# Patient Record
Sex: Female | Born: 1965 | Race: White | Hispanic: No | Smoking: Former smoker
Health system: Southern US, Community
[De-identification: ages and names within clinical notes are randomized; demographics above are authoritative.]

## PROBLEM LIST (undated history)

## (undated) DIAGNOSIS — E785 Hyperlipidemia, unspecified: Secondary | ICD-10-CM

## (undated) DIAGNOSIS — M810 Age-related osteoporosis without current pathological fracture: Secondary | ICD-10-CM

## (undated) DIAGNOSIS — I10 Essential (primary) hypertension: Secondary | ICD-10-CM

## (undated) DIAGNOSIS — F419 Anxiety disorder, unspecified: Secondary | ICD-10-CM

## (undated) DIAGNOSIS — D649 Anemia, unspecified: Secondary | ICD-10-CM

## (undated) HISTORY — DX: Anxiety disorder, unspecified: F41.9

## (undated) HISTORY — DX: Age-related osteoporosis without current pathological fracture: M81.0

## (undated) HISTORY — DX: Essential (primary) hypertension: I10

## (undated) HISTORY — DX: Hyperlipidemia, unspecified: E78.5

## (undated) HISTORY — PX: ABDOMINAL HYSTERECTOMY: SHX81

## (undated) HISTORY — DX: Anemia, unspecified: D64.9

---

## 2001-08-04 ENCOUNTER — Emergency Department (HOSPITAL_COMMUNITY): Admission: EM | Admit: 2001-08-04 | Discharge: 2001-08-04 | Payer: Self-pay

## 2002-11-18 ENCOUNTER — Emergency Department (HOSPITAL_COMMUNITY): Admission: EM | Admit: 2002-11-18 | Discharge: 2002-11-18 | Payer: Self-pay | Admitting: Emergency Medicine

## 2003-04-18 ENCOUNTER — Other Ambulatory Visit: Admission: RE | Admit: 2003-04-18 | Discharge: 2003-04-18 | Payer: Self-pay | Admitting: *Deleted

## 2003-04-18 ENCOUNTER — Other Ambulatory Visit: Admission: RE | Admit: 2003-04-18 | Discharge: 2003-04-18 | Payer: Self-pay | Admitting: Family Medicine

## 2005-01-11 ENCOUNTER — Emergency Department (HOSPITAL_COMMUNITY): Admission: EM | Admit: 2005-01-11 | Discharge: 2005-01-11 | Payer: Self-pay | Admitting: Emergency Medicine

## 2009-05-08 ENCOUNTER — Emergency Department (HOSPITAL_COMMUNITY): Admission: EM | Admit: 2009-05-08 | Discharge: 2009-05-08 | Payer: Self-pay | Admitting: Emergency Medicine

## 2010-09-25 LAB — POCT CARDIAC MARKERS
CKMB, poc: <1 ng/mL — ABNORMAL LOW (ref 1.0–8.0)
Myoglobin, poc: 40.3 ng/mL (ref 12–200)

## 2010-09-25 LAB — CBC
MCHC: 34.5 g/dL (ref 30.0–36.0)
MCV: 94.9 fL (ref 78.0–100.0)
Platelets: 281 10*3/uL (ref 150–400)
RBC: 4.35 MIL/uL (ref 3.87–5.11)
RDW: 12.7 % (ref 11.5–15.5)

## 2010-09-25 LAB — BASIC METABOLIC PANEL
BUN: 5 mg/dL — ABNORMAL LOW (ref 6–23)
CO2: 26 mEq/L (ref 19–32)
Calcium: 9.3 mg/dL (ref 8.4–10.5)
Chloride: 102 mEq/L (ref 96–112)
Creatinine, Ser: 0.61 mg/dL (ref 0.4–1.2)
GFR calc Af Amer: >60 mL/min (ref >60)

## 2010-09-25 LAB — DIFFERENTIAL
Basophils Absolute: 0 10*3/uL (ref 0.0–0.1)
Basophils Relative: 0 % (ref 0–1)
Eosinophils Absolute: 0.1 10*3/uL (ref 0.0–0.7)
Monocytes Relative: 9 % (ref 3–12)
Neutro Abs: 5.5 10*3/uL (ref 1.7–7.7)
Neutrophils Relative %: 66 % (ref 43–77)

## 2014-01-13 ENCOUNTER — Other Ambulatory Visit: Payer: Self-pay | Admitting: Family

## 2014-02-17 ENCOUNTER — Ambulatory Visit (INDEPENDENT_AMBULATORY_CARE_PROVIDER_SITE_OTHER): Payer: Medicaid Other | Admitting: Family

## 2014-02-17 ENCOUNTER — Encounter: Payer: Self-pay | Admitting: Family

## 2014-02-17 ENCOUNTER — Encounter (INDEPENDENT_AMBULATORY_CARE_PROVIDER_SITE_OTHER): Payer: Self-pay

## 2014-02-17 ENCOUNTER — Other Ambulatory Visit: Payer: Self-pay | Admitting: *Deleted

## 2014-02-17 VITALS — BP 138/85 | HR 83 | Temp 98.1°F | Ht 66.25 in | Wt 135.4 lb

## 2014-02-17 DIAGNOSIS — R5381 Other malaise: Secondary | ICD-10-CM

## 2014-02-17 DIAGNOSIS — Z23 Encounter for immunization: Secondary | ICD-10-CM

## 2014-02-17 DIAGNOSIS — Z0000 Encounter for general adult medical examination without abnormal findings: Secondary | ICD-10-CM

## 2014-02-17 DIAGNOSIS — Z01419 Encounter for gynecological examination (general) (routine) without abnormal findings: Secondary | ICD-10-CM

## 2014-02-17 DIAGNOSIS — F411 Generalized anxiety disorder: Secondary | ICD-10-CM

## 2014-02-17 DIAGNOSIS — R5383 Other fatigue: Secondary | ICD-10-CM

## 2014-02-17 DIAGNOSIS — R252 Cramp and spasm: Secondary | ICD-10-CM

## 2014-02-17 DIAGNOSIS — Z124 Encounter for screening for malignant neoplasm of cervix: Secondary | ICD-10-CM

## 2014-02-17 LAB — POCT UA - MICROSCOPIC ONLY
BACTERIA, U MICROSCOPIC: NEGATIVE
CASTS, UR, LPF, POC: NEGATIVE
Crystals, Ur, HPF, POC: NEGATIVE
MUCUS UA: NEGATIVE
RBC, URINE, MICROSCOPIC: NEGATIVE
WBC, Ur, HPF, POC: NEGATIVE
Yeast, UA: NEGATIVE

## 2014-02-17 LAB — POCT URINALYSIS DIPSTICK
Bilirubin, UA: NEGATIVE
GLUCOSE UA: NEGATIVE
KETONES UA: NEGATIVE
Leukocytes, UA: NEGATIVE
Nitrite, UA: NEGATIVE
Protein, UA: NEGATIVE
RBC UA: NEGATIVE
UROBILINOGEN UA: NEGATIVE
pH, UA: 6.5

## 2014-02-17 MED ORDER — ESCITALOPRAM OXALATE 10 MG PO TABS: 10.0000 mg | ORAL_TABLET | Freq: Every day | ORAL | Status: DC

## 2014-02-17 NOTE — Patient Instructions (Addendum)
Health Maintenance Adopting a healthy lifestyle and getting preventive care can go a long way to promote health and wellness. Talk with your health care provider about what schedule of regular examinations is right for you. This is a good chance for you to check in with your provider about disease prevention and staying healthy. In between checkups, there are plenty of things you can do on your own. Experts have done a lot of research about which lifestyle changes and preventive measures are most likely to keep you healthy. Ask your health care provider for more information. WEIGHT AND DIET  Eat a healthy diet  Be sure to include plenty of vegetables, fruits, low-fat dairy products, and lean protein.  Do not eat a lot of foods high in solid fats, added sugars, or salt.  Get regular exercise. This is one of the most important things you can do for your health.  Most adults should exercise for at least 150 minutes each week. The exercise should increase your heart rate and make you sweat (moderate-intensity exercise).  Most adults should also do strengthening exercises at least twice a week. This is in addition to the moderate-intensity exercise.  Maintain a healthy weight  Body mass index (BMI) is a measurement that can be used to identify possible weight problems. It estimates body fat based on height and weight. Your health care provider can help determine your BMI and help you achieve or maintain a healthy weight.  For females 25 years of age and older:   A BMI below 18.5 is considered underweight.  A BMI of 18.5 to 24.9 is normal.  A BMI of 25 to 29.9 is considered overweight.  A BMI of 30 and above is considered obese.  Watch levels of cholesterol and blood lipids  You should start having your blood tested for lipids and cholesterol at 49 years of age, then have this test every 5 years.  You may need to have your cholesterol levels checked more often if:  Your lipid or  cholesterol levels are high.  You are older than 49 years of age.  You are at high risk for heart disease.  CANCER SCREENING   Lung Cancer  Lung cancer screening is recommended for adults 97-92 years old who are at high risk for lung cancer because of a history of smoking.  A yearly low-dose CT scan of the lungs is recommended for people who:  Currently smoke.  Have quit within the past 15 years.  Have at least a 30-pack-year history of smoking. A pack year is smoking an average of one pack of cigarettes a day for 1 year.  Yearly screening should continue until it has been 15 years since you quit.  Yearly screening should stop if you develop a health problem that would prevent you from having lung cancer treatment.  Breast Cancer  Practice breast self-awareness. This means understanding how your breasts normally appear and feel.  It also means doing regular breast self-exams. Let your health care provider know about any changes, no matter how small.  If you are in your 20s or 30s, you should have a clinical breast exam (CBE) by a health care provider every 1-3 years as part of a regular health exam.  If you are 76 or older, have a CBE every year. Also consider having a breast X-ray (mammogram) every year.  If you have a family history of breast cancer, talk to your health care provider about genetic screening.  If you are  at high risk for breast cancer, talk to your health care provider about having an MRI and a mammogram every year.  Breast cancer gene (BRCA) assessment is recommended for women who have family members with BRCA-related cancers. BRCA-related cancers include:  Breast.  Ovarian.  Tubal.  Peritoneal cancers.  Results of the assessment will determine the need for genetic counseling and BRCA1 and BRCA2 testing. Cervical Cancer Routine pelvic examinations to screen for cervical cancer are no longer recommended for nonpregnant women who are considered low  risk for cancer of the pelvic organs (ovaries, uterus, and vagina) and who do not have symptoms. A pelvic examination may be necessary if you have symptoms including those associated with pelvic infections. Ask your health care provider if a screening pelvic exam is right for you.   The Pap test is the screening test for cervical cancer for women who are considered at risk.  If you had a hysterectomy for a problem that was not cancer or a condition that could lead to cancer, then you no longer need Pap tests.  If you are older than 65 years, and you have had normal Pap tests for the past 10 years, you no longer need to have Pap tests.  If you have had past treatment for cervical cancer or a condition that could lead to cancer, you need Pap tests and screening for cancer for at least 20 years after your treatment.  If you no longer get a Pap test, assess your risk factors if they change (such as having a new sexual partner). This can affect whether you should start being screened again.  Some women have medical problems that increase their chance of getting cervical cancer. If this is the case for you, your health care provider may recommend more frequent screening and Pap tests.  The human papillomavirus (HPV) test is another test that may be used for cervical cancer screening. The HPV test looks for the virus that can cause cell changes in the cervix. The cells collected during the Pap test can be tested for HPV.  The HPV test can be used to screen women 30 years of age and older. Getting tested for HPV can extend the interval between normal Pap tests from three to five years.  An HPV test also should be used to screen women of any age who have unclear Pap test results.  After 49 years of age, women should have HPV testing as often as Pap tests.  Colorectal Cancer  This type of cancer can be detected and often prevented.  Routine colorectal cancer screening usually begins at 50 years of  age and continues through 49 years of age.  Your health care provider may recommend screening at an earlier age if you have risk factors for colon cancer.  Your health care provider may also recommend using home test kits to check for hidden blood in the stool.  A small camera at the end of a tube can be used to examine your colon directly (sigmoidoscopy or colonoscopy). This is done to check for the earliest forms of colorectal cancer.  Routine screening usually begins at age 50.  Direct examination of the colon should be repeated every 5-10 years through 49 years of age. However, you may need to be screened more often if early forms of precancerous polyps or small growths are found. Skin Cancer  Check your skin from head to toe regularly.  Tell your health care provider about any new moles or changes in   moles, especially if there is a change in a mole's shape or color.  Also tell your health care provider if you have a mole that is larger than the size of a pencil eraser.  Always use sunscreen. Apply sunscreen liberally and repeatedly throughout the day.  Protect yourself by wearing long sleeves, pants, a wide-brimmed hat, and sunglasses whenever you are outside. HEART DISEASE, DIABETES, AND HIGH BLOOD PRESSURE   Have your blood pressure checked at least every 1-2 years. High blood pressure causes heart disease and increases the risk of stroke.  If you are between 75 years and 42 years old, ask your health care provider if you should take aspirin to prevent strokes.  Have regular diabetes screenings. This involves taking a blood sample to check your fasting blood sugar level.  If you are at a normal weight and have a low risk for diabetes, have this test once every three years after 49 years of age.  If you are overweight and have a high risk for diabetes, consider being tested at a younger age or more often. PREVENTING INFECTION  Hepatitis B  If you have a higher risk for  hepatitis B, you should be screened for this virus. You are considered at high risk for hepatitis B if:  You were born in a country where hepatitis B is common. Ask your health care provider which countries are considered high risk.  Your parents were born in a high-risk country, and you have not been immunized against hepatitis B (hepatitis B vaccine).  You have HIV or AIDS.  You use needles to inject street drugs.  You live with someone who has hepatitis B.  You have had sex with someone who has hepatitis B.  You get hemodialysis treatment.  You take certain medicines for conditions, including cancer, organ transplantation, and autoimmune conditions. Hepatitis C  Blood testing is recommended for:  Everyone born from [birth year] through 1966.  Anyone with known risk factors for hepatitis C. Sexually transmitted infections (STIs)  You should be screened for sexually transmitted infections (STIs) including gonorrhea and chlamydia if:  You are sexually active and are younger than 48 years of age.  You are older than 49 years of age and your health care provider tells you that you are at risk for this type of infection.  Your sexual activity has changed since you were last screened and you are at an increased risk for chlamydia or gonorrhea. Ask your health care provider if you are at risk.  If you do not have HIV, but are at risk, it may be recommended that you take a prescription medicine daily to prevent HIV infection. This is called pre-exposure prophylaxis (PrEP). You are considered at risk if:  You are sexually active and do not regularly use condoms or know the HIV status of your partner(s).  You take drugs by injection.  You are sexually active with a partner who has HIV. Talk with your health care provider about whether you are at high risk of being infected with HIV. If you choose to begin PrEP, you should first be tested for HIV. You should then be tested every 3 months for  as long as you are taking PrEP.  PREGNANCY   If you are premenopausal and you may become pregnant, ask your health care provider about preconception counseling.  If you may become pregnant, take 400 to 800 micrograms (mcg) of folic acid every day.  If you want to prevent pregnancy, talk to your  health care provider about birth control (contraception). OSTEOPOROSIS AND MENOPAUSE   Osteoporosis is a disease in which the bones lose minerals and strength with aging. This can result in serious bone fractures. Your risk for osteoporosis can be identified using a bone density scan.  If you are 29 years of age or older, or if you are at risk for osteoporosis and fractures, ask your health care provider if you should be screened.  Ask your health care provider whether you should take a calcium or vitamin D supplement to lower your risk for osteoporosis.  Menopause may have certain physical symptoms and risks.  Hormone replacement therapy may reduce some of these symptoms and risks. Talk to your health care provider about whether hormone replacement therapy is right for you.  HOME CARE INSTRUCTIONS   Schedule regular health, dental, and eye exams.  Stay current with your immunizations.   Do not use any tobacco products including cigarettes, chewing tobacco, or electronic cigarettes.  If you are pregnant, do not drink alcohol.  If you are breastfeeding, limit how much and how often you drink alcohol.  Limit alcohol intake to no more than 1 drink per day for nonpregnant women. One drink equals 12 ounces of beer, 5 ounces of wine, or 1 ounces of hard liquor.  Do not use street drugs.  Do not share needles.  Ask your health care provider for help if you need support or information about quitting drugs.  Tell your health care provider if you often feel depressed.  Tell your health care provider if you have ever been abused or do not feel safe at home. Document Released: 12/23/2010  Document Revised: 10/24/2013 Document Reviewed: 05/11/2013 [facility] Patient Information 2015 [place], [place]. This information is not intended to replace advice given to you by your health care provider. Make sure you discuss any questions you have with your health care provider. Muscle Cramps and Spasms Muscle cramps and spasms occur when a muscle or muscles tighten and you have no control over this tightening (involuntary muscle contraction). They are a common problem and can develop in any muscle. The most common place is in the calf muscles of the leg. Both muscle cramps and muscle spasms are involuntary muscle contractions, but they also have differences:   Muscle cramps are sporadic and painful. They may last a few seconds to a quarter of an hour. Muscle cramps are often more forceful and last longer than muscle spasms.  Muscle spasms may or may not be painful. They may also last just a few seconds or much longer. CAUSES  It is uncommon for cramps or spasms to be due to a serious underlying problem. In many cases, the cause of cramps or spasms is unknown. Some common causes are:   Overexertion.   Overuse from repetitive motions (doing the same thing over and over).   Remaining in a certain position for a long period of time.   Improper preparation, form, or technique while performing a sport or activity.   Dehydration.   Injury.   Side effects of some medicines.   Abnormally low levels of the salts and ions in your blood (electrolytes), especially potassium and calcium. This could happen if you are taking water pills (diuretics) or you are pregnant.  Some underlying medical problems can make it more likely to develop cramps or spasms. These include, but are not limited to:   Diabetes.   Parkinson disease.   Hormone disorders, such as thyroid problems.   Alcohol  abuse.   Diseases specific to muscles, joints, and bones.   Blood vessel disease where not enough  blood is getting to the muscles.  HOME CARE INSTRUCTIONS   Stay well hydrated. Drink enough water and fluids to keep your urine clear or pale yellow.  It may be helpful to massage, stretch, and relax the affected muscle.  For tight or tense muscles, use a warm towel, heating pad, or hot shower water directed to the affected area.  If you are sore or have pain after a cramp or spasm, applying ice to the affected area may relieve discomfort.  Put ice in a plastic bag.  Place a towel between your skin and the bag.  Leave the ice on for 15-20 minutes, 03-04 times a day.  Medicines used to treat a known cause of cramps or spasms may help reduce their frequency or severity. Only take over-the-counter or prescription medicines as directed by your caregiver. SEEK MEDICAL CARE IF:  Your cramps or spasms get more severe, more frequent, or do not improve over time.  MAKE SURE YOU:   Understand these instructions.  Will watch your condition.  Will get help right away if you are not doing well or get worse. Document Released: 11/29/2001 Document Revised: 10/04/2012 Document Reviewed: 05/26/2012 [facility] Patient Information 2015 [place], [place]. This information is not intended to replace advice given to you by your health care provider. Make sure you discuss any questions you have with your health care provider.

## 2014-02-17 NOTE — Progress Notes (Signed)
Subjective:    Patient ID: [name], female    DOB: [birth year], 48 y.o.   MRN: [id number]  HPI Pt presents to the office for annual with pap. PT currently only taking iron supplements. Pt states her feet hurt her everyday. Pt states she has calluses on the bottom of bilateral feet. Pt states she is also having a lot of anxiety and would like to be started on something today to help with this.    Review of Systems  Constitutional: Negative.   HENT: Positive for postnasal drip, rhinorrhea and sneezing.   Eyes: Negative.   Respiratory: Negative.  Negative for shortness of breath.   Cardiovascular: Negative.  Negative for palpitations.  Gastrointestinal: Negative.   Endocrine: Negative.   Genitourinary: Negative.   Musculoskeletal: Positive for arthralgias.  Neurological: Negative.  Negative for headaches.  Hematological: Negative.   Psychiatric/Behavioral: Negative.   All other systems reviewed and are negative.      Objective:   Physical Exam  Vitals reviewed. Constitutional: She is oriented to person, place, and time. She appears well-developed and well-nourished. No distress.  HENT:  Head: Normocephalic and atraumatic.  Right Ear: External ear normal.  Mouth/Throat: Oropharynx is clear and moist.  Eyes: Pupils are equal, round, and reactive to light.  Neck: Normal range of motion. Neck supple. No thyromegaly present.  Cardiovascular: Normal rate, regular rhythm, normal heart sounds and intact distal pulses.   No murmur heard. Pulmonary/Chest: Effort normal and breath sounds normal. No respiratory distress. She has no wheezes. Right breast exhibits no inverted nipple, no mass, no nipple discharge, no skin change and no tenderness. Left breast exhibits no inverted nipple, no mass, no nipple discharge, no skin change and no tenderness. Breasts are symmetrical.  Abdominal: Soft. Bowel sounds are normal. She exhibits no distension. There is no tenderness.  Genitourinary:  Vagina normal and uterus normal. No vaginal discharge found.  Bimanual exam- no adnexal masses or tenderness, ovaries nonpalpable   Cervix parous and pink- No discharge   Musculoskeletal: Normal range of motion. She exhibits no edema and no tenderness.  Neurological: She is alert and oriented to person, place, and time. She has normal reflexes. No cranial nerve deficit.  Skin: Skin is warm and dry.  Psychiatric: She has a normal mood and affect. Her behavior is normal. Judgment and thought content normal.    BP 148/90  Pulse 83  Temp(Src) 98.1 F (36.7 C) (Oral)  Ht 5' 6.25" (1.683 m)  Wt 135 lb 6.4 oz (61.417 kg)  BMI 21.68 kg/m2       Assessment & Plan:  1. Annual physical exam - CMP14+EGFR - Lipid panel - Vit D  25 hydroxy (rtn osteoporosis monitoring) - Thyroid Panel With TSH - POCT UA - Microscopic Only - POCT urinalysis dipstick - Pap IG w/ reflex to HPV when ASC-U  2. Other malaise and fatigue - CMP14+EGFR - Vit D  25 hydroxy (rtn osteoporosis monitoring) - Thyroid Panel With TSH  3. GAD (generalized anxiety disorder) - escitalopram (LEXAPRO) 10 MG tablet; Take 1 tablet (10 mg total) by mouth daily.  Dispense: 90 tablet; Refill: 1  4. Foot cramps - Magnesium   Continue all meds Labs pending Health Maintenance reviewed Diet and exercise encouraged RTO 2 weeks for GAD and discuss if foot cramps have improved  Evelina Dun, FNP

## 2014-02-18 LAB — CMP14+EGFR
A/G RATIO: 2.1 (ref 1.1–2.5)
ALT: 12 IU/L (ref 0–32)
AST: 17 IU/L (ref 0–40)
Albumin: 4.4 g/dL (ref 3.5–5.5)
Alkaline Phosphatase: 52 IU/L (ref 39–117)
BUN/Creatinine Ratio: 11 (ref 9–23)
BUN: 8 mg/dL (ref 6–24)
CALCIUM: 9.6 mg/dL (ref 8.7–10.2)
CO2: 26 mmol/L (ref 18–29)
CREATININE: 0.7 mg/dL (ref 0.57–1.00)
Chloride: 100 mmol/L (ref 97–108)
GFR calc Af Amer: 118 mL/min/{1.73_m2} (ref >59)
GFR, EST NON AFRICAN AMERICAN: 103 mL/min/{1.73_m2} (ref >59)
GLOBULIN, TOTAL: 2.1 g/dL (ref 1.5–4.5)
GLUCOSE: 90 mg/dL (ref 65–99)
Potassium: 4.4 mmol/L (ref 3.5–5.2)
Sodium: 140 mmol/L (ref 134–144)
TOTAL PROTEIN: 6.5 g/dL (ref 6.0–8.5)
Total Bilirubin: 0.2 mg/dL (ref 0.0–1.2)

## 2014-02-18 LAB — THYROID PANEL WITH TSH
FREE THYROXINE INDEX: 1.9 (ref 1.2–4.9)
T3 Uptake Ratio: 24 % (ref 24–39)
T4, Total: 7.9 ug/dL (ref 4.5–12.0)
TSH: 0.973 u[IU]/mL (ref 0.450–4.500)

## 2014-02-18 LAB — LIPID PANEL
CHOL/HDL RATIO: 3.5 ratio (ref 0.0–4.4)
Cholesterol, Total: 207 mg/dL — ABNORMAL HIGH (ref 100–199)
HDL: 60 mg/dL (ref >39)
LDL Calculated: 117 mg/dL — ABNORMAL HIGH (ref 0–99)
Triglycerides: 152 mg/dL — ABNORMAL HIGH (ref 0–149)
VLDL CHOLESTEROL CAL: 30 mg/dL (ref 5–40)

## 2014-02-18 LAB — VITAMIN D 25 HYDROXY (VIT D DEFICIENCY, FRACTURES): VIT D 25 HYDROXY: 42.8 ng/mL (ref 30.0–100.0)

## 2014-02-18 LAB — MAGNESIUM: MAGNESIUM: 2 mg/dL (ref 1.6–2.6)

## 2014-02-20 ENCOUNTER — Other Ambulatory Visit: Payer: Self-pay | Admitting: Family

## 2014-02-20 LAB — PAP IG W/ RFLX HPV ASCU: PAP SMEAR COMMENT: 0

## 2014-02-20 MED ORDER — SIMVASTATIN 20 MG PO TABS: 20.0000 mg | ORAL_TABLET | Freq: Every day | ORAL | Status: DC

## 2014-03-06 ENCOUNTER — Encounter: Payer: Self-pay | Admitting: Family

## 2014-03-06 ENCOUNTER — Ambulatory Visit (INDEPENDENT_AMBULATORY_CARE_PROVIDER_SITE_OTHER): Payer: Medicaid Other | Admitting: Family

## 2014-03-06 VITALS — BP 149/82 | HR 80 | Temp 96.8°F | Ht 66.25 in | Wt 138.0 lb

## 2014-03-06 DIAGNOSIS — F411 Generalized anxiety disorder: Secondary | ICD-10-CM | POA: Insufficient documentation

## 2014-03-06 DIAGNOSIS — E785 Hyperlipidemia, unspecified: Secondary | ICD-10-CM | POA: Insufficient documentation

## 2014-03-06 DIAGNOSIS — I10 Essential (primary) hypertension: Secondary | ICD-10-CM | POA: Insufficient documentation

## 2014-03-06 MED ORDER — HYDROCHLOROTHIAZIDE 25 MG PO TABS: 25.0000 mg | ORAL_TABLET | Freq: Every day | ORAL | Status: DC

## 2014-03-06 MED ORDER — ESCITALOPRAM OXALATE 20 MG PO TABS: 20.0000 mg | ORAL_TABLET | Freq: Every day | ORAL | Status: DC

## 2014-03-06 NOTE — Patient Instructions (Signed)
DASH Eating Plan DASH stands for "Dietary Approaches to Stop Hypertension." The DASH eating plan is a healthy eating plan that has been shown to reduce high blood pressure (hypertension). Additional health benefits may include reducing the risk of type 2 diabetes mellitus, heart disease, and stroke. The DASH eating plan may also help with weight loss. WHAT DO I NEED TO KNOW ABOUT THE DASH EATING PLAN? For the DASH eating plan, you will follow these general guidelines:  Choose foods with a percent daily value for sodium of less than 5% (as listed on the food label).  Use salt-free seasonings or herbs instead of table salt or sea salt.  Check with your health care provider or pharmacist before using salt substitutes.  Eat lower-sodium products, often labeled as "lower sodium" or "no salt added."  Eat fresh foods.  Eat more vegetables, fruits, and low-fat dairy products.  Choose whole grains. Look for the word "whole" as the first word in the ingredient list.  Choose fish and skinless chicken or turkey more often than red meat. Limit fish, poultry, and meat to 6 oz (170 g) each day.  Limit sweets, desserts, sugars, and sugary drinks.  Choose heart-healthy fats.  Limit cheese to 1 oz (28 g) per day.  Eat more home-cooked food and less restaurant, buffet, and fast food.  Limit fried foods.  Cook foods using methods other than frying.  Limit canned vegetables. If you do use them, rinse them well to decrease the sodium.  When eating at a restaurant, ask that your food be prepared with less salt, or no salt if possible. WHAT FOODS CAN I EAT? Seek help from a dietitian for individual calorie needs. Grains Whole grain or whole wheat bread. Brown rice. Whole grain or whole wheat pasta. Quinoa, bulgur, and whole grain cereals. Low-sodium cereals. Corn or whole wheat flour tortillas. Whole grain cornbread. Whole grain crackers. Low-sodium crackers. Vegetables Fresh or frozen vegetables  (raw, steamed, roasted, or grilled). Low-sodium or reduced-sodium tomato and vegetable juices. Low-sodium or reduced-sodium tomato sauce and paste. Low-sodium or reduced-sodium canned vegetables.  Fruits All fresh, canned (in natural juice), or frozen fruits. Meat and Other Protein Products Ground beef (85% or leaner), grass-fed beef, or beef trimmed of fat. Skinless chicken or turkey. Ground chicken or turkey. Pork trimmed of fat. All fish and seafood. Eggs. Dried beans, peas, or lentils. Unsalted nuts and seeds. Unsalted canned beans. Dairy Low-fat dairy products, such as skim or 1% milk, 2% or reduced-fat cheeses, low-fat ricotta or cottage cheese, or plain low-fat yogurt. Low-sodium or reduced-sodium cheeses. Fats and Oils Tub margarines without trans fats. Light or reduced-fat mayonnaise and salad dressings (reduced sodium). Avocado. Safflower, olive, or canola oils. Natural peanut or almond butter. Other Unsalted popcorn and pretzels. The items listed above may not be a complete list of recommended foods or beverages. Contact your dietitian for more options. WHAT FOODS ARE NOT RECOMMENDED? Grains White bread. White pasta. White rice. Refined cornbread. Bagels and croissants. Crackers that contain trans fat. Vegetables Creamed or fried vegetables. Vegetables in a cheese sauce. Regular canned vegetables. Regular canned tomato sauce and paste. Regular tomato and vegetable juices. Fruits Dried fruits. Canned fruit in light or heavy syrup. Fruit juice. Meat and Other Protein Products Fatty cuts of meat. Ribs, chicken wings, bacon, sausage, bologna, salami, chitterlings, fatback, hot dogs, bratwurst, and packaged luncheon meats. Salted nuts and seeds. Canned beans with salt. Dairy Whole or 2% milk, cream, half-and-half, and cream cheese. Whole-fat or sweetened yogurt. Full-fat   cheeses or blue cheese. Nondairy creamers and whipped toppings. Processed cheese, cheese spreads, or cheese  curds. Condiments Onion and garlic salt, seasoned salt, table salt, and sea salt. Canned and packaged gravies. Worcestershire sauce. Tartar sauce. Barbecue sauce. Teriyaki sauce. Soy sauce, including reduced sodium. Steak sauce. Fish sauce. Oyster sauce. Cocktail sauce. Horseradish. Ketchup and mustard. Meat flavorings and tenderizers. Bouillon cubes. Hot sauce. Tabasco sauce. Marinades. Taco seasonings. Relishes. Fats and Oils Butter, stick margarine, lard, shortening, ghee, and bacon fat. Coconut, palm kernel, or palm oils. Regular salad dressings. Other Pickles and olives. Salted popcorn and pretzels. The items listed above may not be a complete list of foods and beverages to avoid. Contact your dietitian for more information. WHERE CAN I FIND MORE INFORMATION? National Heart, Lung, and Blood Institute: www.nhlbi.nih.gov/health/health-topics/topics/dash/ Document Released: 05/29/2011 Document Revised: 10/24/2013 Document Reviewed: 04/13/2013 ExitCare Patient Information 2015 [employer]. This information is not intended to replace advice given to you by your health care provider. Make sure you discuss any questions you have with your health care provider. Hypertension Hypertension, commonly called high blood pressure, is when the force of blood pumping through your arteries is too strong. Your arteries are the blood vessels that carry blood from your heart throughout your body. A blood pressure reading consists of a higher number over a lower number, such as 110/72. The higher number (systolic) is the pressure inside your arteries when your heart pumps. The lower number (diastolic) is the pressure inside your arteries when your heart relaxes. Ideally you want your blood pressure below 120/80. Hypertension forces your heart to work harder to pump blood. Your arteries may become narrow or stiff. Having hypertension puts you at risk for heart disease, stroke, and other problems.  RISK  FACTORS Some risk factors for high blood pressure are controllable. Others are not.  Risk factors you cannot control include:   Race. You may be at higher risk if you are African American.  Age. Risk increases with age.  Gender. Men are at higher risk than women before age 45 years. After age 65, women are at higher risk than men. Risk factors you can control include:  Not getting enough exercise or physical activity.  Being overweight.  Getting too much fat, sugar, calories, or salt in your diet.  Drinking too much alcohol. SIGNS AND SYMPTOMS Hypertension does not usually cause signs or symptoms. Extremely high blood pressure (hypertensive crisis) may cause headache, anxiety, shortness of breath, and nosebleed. DIAGNOSIS  To check if you have hypertension, your health care provider will measure your blood pressure while you are seated, with your arm held at the level of your heart. It should be measured at least twice using the same arm. Certain conditions can cause a difference in blood pressure between your right and left arms. A blood pressure reading that is higher than normal on one occasion does not mean that you need treatment. If one blood pressure reading is high, ask your health care provider about having it checked again. TREATMENT  Treating high blood pressure includes making lifestyle changes and possibly taking medicine. Living a healthy lifestyle can help lower high blood pressure. You may need to change some of your habits. Lifestyle changes may include:  Following the DASH diet. This diet is high in fruits, vegetables, and whole grains. It is low in salt, red meat, and added sugars.  Getting at least 2 hours of brisk physical activity every week.  Losing weight if necessary.  Not smoking.  Limiting   alcoholic beverages.  Learning ways to reduce stress. If lifestyle changes are not enough to get your blood pressure under control, your health care provider may  prescribe medicine. You may need to take more than one. Work closely with your health care provider to understand the risks and benefits. HOME CARE INSTRUCTIONS  Have your blood pressure rechecked as directed by your health care provider.   Take medicines only as directed by your health care provider. Follow the directions carefully. Blood pressure medicines must be taken as prescribed. The medicine does not work as well when you skip doses. Skipping doses also puts you at risk for problems.   Do not smoke.   Monitor your blood pressure at home as directed by your health care provider. SEEK MEDICAL CARE IF:   You think you are having a reaction to medicines taken.  You have recurrent headaches or feel dizzy.  You have swelling in your ankles.  You have trouble with your vision. SEEK IMMEDIATE MEDICAL CARE IF:  You develop a severe headache or confusion.  You have unusual weakness, numbness, or feel faint.  You have severe chest or abdominal pain.  You vomit repeatedly.  You have trouble breathing. MAKE SURE YOU:   Understand these instructions.  Will watch your condition.  Will get help right away if you are not doing well or get worse. Document Released: 06/09/2005 Document Revised: 10/24/2013 Document Reviewed: 04/01/2013 ExitCare Patient Information 2015 [employer]. This information is not intended to replace advice given to you by your health care provider. Make sure you discuss any questions you have with your health care provider.  

## 2014-03-06 NOTE — Progress Notes (Signed)
   Subjective:    Patient ID: [name], female    DOB: [birth year], 48 y.o.   MRN: [id number]  Anxiety Presents for follow-up visit. Symptoms include excessive worry, irritability and nervous/anxious behavior. Patient reports no depressed mood, palpitations or shortness of breath. Symptoms occur occasionally. The symptoms are aggravated by family issues.   Her past medical history is significant for anxiety/panic attacks. There is no history of depression. Past treatments include SSRIs. The treatment provided mild relief. Compliance with prior treatments has been good.      Review of Systems  Constitutional: Positive for irritability.  HENT: Negative.   Eyes: Negative.   Respiratory: Negative.  Negative for shortness of breath.   Cardiovascular: Negative.  Negative for palpitations.  Gastrointestinal: Negative.   Endocrine: Negative.   Genitourinary: Negative.   Musculoskeletal: Negative.   Neurological: Negative.  Negative for headaches.  Hematological: Negative.   Psychiatric/Behavioral: The patient is nervous/anxious.   All other systems reviewed and are negative.      Objective:   Physical Exam  Vitals reviewed. Constitutional: She is oriented to person, place, and time. She appears well-developed and well-nourished. No distress.  HENT:  Head: Normocephalic and atraumatic.  Right Ear: External ear normal.  Mouth/Throat: Oropharynx is clear and moist.  Eyes: Pupils are equal, round, and reactive to light.  Neck: Normal range of motion. Neck supple. No thyromegaly present.  Cardiovascular: Normal rate, regular rhythm, normal heart sounds and intact distal pulses.   No murmur heard. Pulmonary/Chest: Effort normal and breath sounds normal. No respiratory distress. She has no wheezes.  Abdominal: Soft. Bowel sounds are normal. She exhibits no distension. There is no tenderness.  Musculoskeletal: Normal range of motion. She exhibits no edema and no tenderness.    Neurological: She is alert and oriented to person, place, and time. She has normal reflexes. No cranial nerve deficit.  Skin: Skin is warm and dry.  Psychiatric: She has a normal mood and affect. Her behavior is normal. Judgment and thought content normal.     BP 149/82  Pulse 80  Temp(Src) 96.8 F (36 C) (Oral)  Ht 5' 6.25" (1.683 m)  Wt 138 lb (62.596 kg)  BMI 22.10 kg/m2      Assessment & Plan:  1. GAD (generalized anxiety disorder) -Stress management discussed - escitalopram (LEXAPRO) 20 MG tablet; Take 1 tablet (20 mg total) by mouth daily.  Dispense: 90 tablet; Refill: 3  2. Hyperlipidemia -Continue medication -Diet discussed -Low fat diet  3. Essential hypertension, benign -Dash diet information given -Exercise encouraged - Stress Management  -Continue current meds - hydrochlorothiazide (HYDRODIURIL) 25 MG tablet; Take 1 tablet (25 mg total) by mouth daily.  Dispense: 90 tablet; Refill: 3  -RTO in 2 weeks for blood pressure and recheck anxiety  Evelina Dun, FNP

## 2014-03-24 ENCOUNTER — Encounter: Payer: Self-pay | Admitting: Family

## 2014-03-24 ENCOUNTER — Ambulatory Visit (INDEPENDENT_AMBULATORY_CARE_PROVIDER_SITE_OTHER): Payer: Medicaid Other | Admitting: Family

## 2014-03-24 VITALS — BP 141/76 | HR 72 | Temp 98.3°F | Ht 66.25 in | Wt 133.0 lb

## 2014-03-24 DIAGNOSIS — Z23 Encounter for immunization: Secondary | ICD-10-CM

## 2014-03-24 DIAGNOSIS — I10 Essential (primary) hypertension: Secondary | ICD-10-CM

## 2014-03-24 DIAGNOSIS — F411 Generalized anxiety disorder: Secondary | ICD-10-CM

## 2014-03-24 MED ORDER — SIMVASTATIN 20 MG PO TABS: 20.0000 mg | ORAL_TABLET | Freq: Every day | ORAL | Status: DC

## 2014-03-24 NOTE — Patient Instructions (Addendum)
Hypertension Hypertension, commonly called high blood pressure, is when the force of blood pumping through your arteries is too strong. Your arteries are the blood vessels that carry blood from your heart throughout your body. A blood pressure reading consists of a higher number over a lower number, such as 110/72. The higher number (systolic) is the pressure inside your arteries when your heart pumps. The lower number (diastolic) is the pressure inside your arteries when your heart relaxes. Ideally you want your blood pressure below 120/80. Hypertension forces your heart to work harder to pump blood. Your arteries may become narrow or stiff. Having hypertension puts you at risk for heart disease, stroke, and other problems.  RISK FACTORS Some risk factors for high blood pressure are controllable. Others are not.  Risk factors you cannot control include:   Race. You may be at higher risk if you are African American.  Age. Risk increases with age.  Gender. Men are at higher risk than women before age 45 years. After age 65, women are at higher risk than men. Risk factors you can control include:  Not getting enough exercise or physical activity.  Being overweight.  Getting too much fat, sugar, calories, or salt in your diet.  Drinking too much alcohol. SIGNS AND SYMPTOMS Hypertension does not usually cause signs or symptoms. Extremely high blood pressure (hypertensive crisis) may cause headache, anxiety, shortness of breath, and nosebleed. DIAGNOSIS  To check if you have hypertension, your health care provider will measure your blood pressure while you are seated, with your arm held at the level of your heart. It should be measured at least twice using the same arm. Certain conditions can cause a difference in blood pressure between your right and left arms. A blood pressure reading that is higher than normal on one occasion does not mean that you need treatment. If one blood pressure reading  is high, ask your health care provider about having it checked again. TREATMENT  Treating high blood pressure includes making lifestyle changes and possibly taking medicine. Living a healthy lifestyle can help lower high blood pressure. You may need to change some of your habits. Lifestyle changes may include:  Following the DASH diet. This diet is high in fruits, vegetables, and whole grains. It is low in salt, red meat, and added sugars.  Getting at least 2 hours of brisk physical activity every week.  Losing weight if necessary.  Not smoking.  Limiting alcoholic beverages.  Learning ways to reduce stress. If lifestyle changes are not enough to get your blood pressure under control, your health care provider may prescribe medicine. You may need to take more than one. Work closely with your health care provider to understand the risks and benefits. HOME CARE INSTRUCTIONS  Have your blood pressure rechecked as directed by your health care provider.   Take medicines only as directed by your health care provider. Follow the directions carefully. Blood pressure medicines must be taken as prescribed. The medicine does not work as well when you skip doses. Skipping doses also puts you at risk for problems.   Do not smoke.   Monitor your blood pressure at home as directed by your health care provider. SEEK MEDICAL CARE IF:   You think you are having a reaction to medicines taken.  You have recurrent headaches or feel dizzy.  You have swelling in your ankles.  You have trouble with your vision. SEEK IMMEDIATE MEDICAL CARE IF:  You develop a severe headache or confusion.    You have unusual weakness, numbness, or feel faint.  You have severe chest or abdominal pain.  You vomit repeatedly.  You have trouble breathing. MAKE SURE YOU:   Understand these instructions.  Will watch your condition.  Will get help right away if you are not doing well or get worse. Document  Released: 06/09/2005 Document Revised: 10/24/2013 Document Reviewed: 04/01/2013 ExitCare Patient Information 2015 [employer]. This information is not intended to replace advice given to you by your health care provider. Make sure you discuss any questions you have with your health care provider. DASH Eating Plan DASH stands for "Dietary Approaches to Stop Hypertension." The DASH eating plan is a healthy eating plan that has been shown to reduce high blood pressure (hypertension). Additional health benefits may include reducing the risk of type 2 diabetes mellitus, heart disease, and stroke. The DASH eating plan may also help with weight loss. WHAT DO I NEED TO KNOW ABOUT THE DASH EATING PLAN? For the DASH eating plan, you will follow these general guidelines:  Choose foods with a percent daily value for sodium of less than 5% (as listed on the food label).  Use salt-free seasonings or herbs instead of table salt or sea salt.  Check with your health care provider or pharmacist before using salt substitutes.  Eat lower-sodium products, often labeled as "lower sodium" or "no salt added."  Eat fresh foods.  Eat more vegetables, fruits, and low-fat dairy products.  Choose whole grains. Look for the word "whole" as the first word in the ingredient list.  Choose fish and skinless chicken or turkey more often than red meat. Limit fish, poultry, and meat to 6 oz (170 g) each day.  Limit sweets, desserts, sugars, and sugary drinks.  Choose heart-healthy fats.  Limit cheese to 1 oz (28 g) per day.  Eat more home-cooked food and less restaurant, buffet, and fast food.  Limit fried foods.  Cook foods using methods other than frying.  Limit canned vegetables. If you do use them, rinse them well to decrease the sodium.  When eating at a restaurant, ask that your food be prepared with less salt, or no salt if possible. WHAT FOODS CAN I EAT? Seek help from a dietitian for individual  calorie needs. Grains Whole grain or whole wheat bread. Brown rice. Whole grain or whole wheat pasta. Quinoa, bulgur, and whole grain cereals. Low-sodium cereals. Corn or whole wheat flour tortillas. Whole grain cornbread. Whole grain crackers. Low-sodium crackers. Vegetables Fresh or frozen vegetables (raw, steamed, roasted, or grilled). Low-sodium or reduced-sodium tomato and vegetable juices. Low-sodium or reduced-sodium tomato sauce and paste. Low-sodium or reduced-sodium canned vegetables.  Fruits All fresh, canned (in natural juice), or frozen fruits. Meat and Other Protein Products Ground beef (85% or leaner), grass-fed beef, or beef trimmed of fat. Skinless chicken or turkey. Ground chicken or turkey. Pork trimmed of fat. All fish and seafood. Eggs. Dried beans, peas, or lentils. Unsalted nuts and seeds. Unsalted canned beans. Dairy Low-fat dairy products, such as skim or 1% milk, 2% or reduced-fat cheeses, low-fat ricotta or cottage cheese, or plain low-fat yogurt. Low-sodium or reduced-sodium cheeses. Fats and Oils Tub margarines without trans fats. Light or reduced-fat mayonnaise and salad dressings (reduced sodium). Avocado. Safflower, olive, or canola oils. Natural peanut or almond butter. Other Unsalted popcorn and pretzels. The items listed above may not be a complete list of recommended foods or beverages. Contact your dietitian for more options. WHAT FOODS ARE NOT RECOMMENDED? Grains White bread.   White pasta. White rice. Refined cornbread. Bagels and croissants. Crackers that contain trans fat. Vegetables Creamed or fried vegetables. Vegetables in a cheese sauce. Regular canned vegetables. Regular canned tomato sauce and paste. Regular tomato and vegetable juices. Fruits Dried fruits. Canned fruit in light or heavy syrup. Fruit juice. Meat and Other Protein Products Fatty cuts of meat. Ribs, chicken wings, bacon, sausage, bologna, salami, chitterlings, fatback, hot dogs,  bratwurst, and packaged luncheon meats. Salted nuts and seeds. Canned beans with salt. Dairy Whole or 2% milk, cream, half-and-half, and cream cheese. Whole-fat or sweetened yogurt. Full-fat cheeses or blue cheese. Nondairy creamers and whipped toppings. Processed cheese, cheese spreads, or cheese curds. Condiments Onion and garlic salt, seasoned salt, table salt, and sea salt. Canned and packaged gravies. Worcestershire sauce. Tartar sauce. Barbecue sauce. Teriyaki sauce. Soy sauce, including reduced sodium. Steak sauce. Fish sauce. Oyster sauce. Cocktail sauce. Horseradish. Ketchup and mustard. Meat flavorings and tenderizers. Bouillon cubes. Hot sauce. Tabasco sauce. Marinades. Taco seasonings. Relishes. Fats and Oils Butter, stick margarine, lard, shortening, ghee, and bacon fat. Coconut, palm kernel, or palm oils. Regular salad dressings. Other Pickles and olives. Salted popcorn and pretzels. The items listed above may not be a complete list of foods and beverages to avoid. Contact your dietitian for more information. WHERE CAN I FIND MORE INFORMATION? National Heart, Lung, and Blood Institute: www.nhlbi.nih.gov/health/health-topics/topics/dash/ Document Released: 05/29/2011 Document Revised: 10/24/2013 Document Reviewed: 04/13/2013 ExitCare Patient Information 2015 [employer]. This information is not intended to replace advice given to you by your health care provider. Make sure you discuss any questions you have with your health care provider.  

## 2014-03-24 NOTE — Progress Notes (Signed)
   Subjective:    Patient ID: [name], female    DOB: June 04, 1966, 48 y.o.   MRN: [id number]  HPI Pt presents to the office for hypertension and GAD. Pt states she is much happier now and is happy with the depression medication. Pt states she has not been taking her blood pressure medication because it was making her "hot". Pt states mother has been taking BP at home and the avg. 94/56 (Not sure if this is accurate?) Pt states she started drinking "lots of water and stopped drinking soft drinks and trying to eat low salt diet".      Review of Systems  Constitutional: Negative.   HENT: Negative.   Eyes: Negative.   Respiratory: Negative.  Negative for shortness of breath.   Cardiovascular: Negative.  Negative for palpitations.  Gastrointestinal: Negative.   Endocrine: Negative.   Genitourinary: Negative.   Musculoskeletal: Negative.   Neurological: Negative.  Negative for headaches.  Hematological: Negative.   Psychiatric/Behavioral: Negative.   All other systems reviewed and are negative.      Objective:   Physical Exam  Vitals reviewed. Constitutional: She is oriented to person, place, and time. She appears well-developed and well-nourished. No distress.  HENT:  Head: Normocephalic and atraumatic.  Right Ear: External ear normal.  Left Ear: External ear normal.  Nose: Nose normal.  Mouth/Throat: Oropharynx is clear and moist.  Eyes: Pupils are equal, round, and reactive to light.  Neck: Normal range of motion. Neck supple. No thyromegaly present.  Cardiovascular: Normal rate, regular rhythm, normal heart sounds and intact distal pulses.   No murmur heard. Pulmonary/Chest: Effort normal and breath sounds normal. No respiratory distress. She has no wheezes.  Abdominal: Soft. Bowel sounds are normal. She exhibits no distension. There is no tenderness.  Musculoskeletal: Normal range of motion. She exhibits no edema and no tenderness.  Neurological: She is alert and  oriented to person, place, and time. She has normal reflexes. No cranial nerve deficit.  Skin: Skin is warm and dry.  Psychiatric: She has a normal mood and affect. Her behavior is normal. Judgment and thought content normal.    BP 141/76  Pulse 72  Temp(Src) 98.3 F (36.8 C) (Oral)  Ht 5' 6.25" (1.683 m)  Wt 133 lb (60.328 kg)  BMI 21.30 kg/m2       Assessment & Plan:  1. GAD (generalized anxiety disorder) -Continue medication -Stress management   2. Essential hypertension, benign -Dash diet information given -Exercise encouraged - Stress Management  -Pt states she would like to wait to start any new blood pressure medications and would like to try diet first- States she will decide on her next visit  -RTO in 6 months for chronic follow-up  Evelina Dun, FNP

## 2014-10-16 ENCOUNTER — Encounter: Payer: Self-pay | Admitting: Family

## 2014-10-16 ENCOUNTER — Ambulatory Visit (INDEPENDENT_AMBULATORY_CARE_PROVIDER_SITE_OTHER): Payer: Medicaid Other | Admitting: Family

## 2014-10-16 VITALS — BP 127/86 | HR 68 | Temp 98.1°F | Ht 66.25 in | Wt 124.4 lb

## 2014-10-16 DIAGNOSIS — F411 Generalized anxiety disorder: Secondary | ICD-10-CM

## 2014-10-16 DIAGNOSIS — E785 Hyperlipidemia, unspecified: Secondary | ICD-10-CM | POA: Diagnosis not present

## 2014-10-16 DIAGNOSIS — I10 Essential (primary) hypertension: Secondary | ICD-10-CM

## 2014-10-16 NOTE — Progress Notes (Signed)
Subjective:    Patient ID: [name], female    DOB: 1966/01/06, 49 y.o.   MRN: [id number]  Anxiety Presents for follow-up visit. Patient reports no compulsions, decreased concentration, depressed mood, hyperventilation, irritability, nervous/anxious behavior, palpitations or shortness of breath. Symptoms occur rarely. The severity of symptoms is moderate. The quality of sleep is good.   Her past medical history is significant for anxiety/panic attacks. There is no history of CAD or depression. Past treatments include SSRIs. Compliance with prior treatments has been good.  Hyperlipidemia This is a chronic problem. The current episode started more than 1 year ago. The problem is uncontrolled. Recent lipid tests were reviewed and are high. She has no history of diabetes or hypothyroidism. Pertinent negatives include no leg pain, myalgias or shortness of breath. Current antihyperlipidemic treatment includes statins. The current treatment provides significant improvement of lipids. Risk factors for coronary artery disease include dyslipidemia, family history, hypertension and a sedentary lifestyle.  Hypertension This is a chronic problem. The current episode started more than 1 year ago. The problem has been waxing and waning since onset. The problem is uncontrolled. Associated symptoms include anxiety. Pertinent negatives include no headaches, palpitations, peripheral edema or shortness of breath. Risk factors for coronary artery disease include dyslipidemia and family history. Past treatments include nothing. The current treatment provides no improvement. There is no history of kidney disease, CAD/MI, CVA, heart failure or a thyroid problem. There is no history of sleep apnea.      Review of Systems  Constitutional: Negative.  Negative for irritability.  HENT: Negative.   Eyes: Negative.   Respiratory: Negative.  Negative for shortness of breath.   Cardiovascular: Negative.  Negative for  palpitations.  Gastrointestinal: Negative.   Endocrine: Negative.   Genitourinary: Negative.   Musculoskeletal: Negative.  Negative for myalgias.  Neurological: Negative.  Negative for headaches.  Hematological: Negative.   Psychiatric/Behavioral: Negative.  Negative for decreased concentration. The patient is not nervous/anxious.   All other systems reviewed and are negative.      Objective:   Physical Exam  Constitutional: She is oriented to person, place, and time. She appears well-developed and well-nourished. No distress.  HENT:  Head: Normocephalic and atraumatic.  Right Ear: External ear normal.  Left Ear: External ear normal.  Nose: Nose normal.  Mouth/Throat: Oropharynx is clear and moist.  Eyes: Pupils are equal, round, and reactive to light.  Neck: Normal range of motion. Neck supple. No thyromegaly present.  Cardiovascular: Normal rate, regular rhythm, normal heart sounds and intact distal pulses.   No murmur heard. Pulmonary/Chest: Effort normal and breath sounds normal. No respiratory distress. She has no wheezes.  Abdominal: Soft. Bowel sounds are normal. She exhibits no distension. There is no tenderness.  Musculoskeletal: Normal range of motion. She exhibits no edema or tenderness.  Neurological: She is alert and oriented to person, place, and time. She has normal reflexes. No cranial nerve deficit.  Skin: Skin is warm and dry.  Psychiatric: She has a normal mood and affect. Her behavior is normal. Judgment and thought content normal.  Vitals reviewed.     BP 127/86 mmHg  Pulse 68  Temp(Src) 98.1 F (36.7 C) (Oral)  Ht 5' 6.25" (1.683 m)  Wt 124 lb 6.4 oz (56.427 kg)  BMI 19.92 kg/m2     Assessment & Plan:  1. Essential hypertension, benign - CMP14+EGFR  2. Hyperlipidemia - CMP14+EGFR - Lipid panel  3. GAD (generalized anxiety disorder) - CMP14+EGFR   Continue all  meds Labs pending Health Maintenance reviewed Diet and exercise  encouraged RTO 6 monhts  Evelina Dun, FNP

## 2014-10-16 NOTE — Patient Instructions (Signed)

## 2014-10-17 LAB — CMP14+EGFR
ALBUMIN: 4.4 g/dL (ref 3.5–5.5)
ALK PHOS: 56 IU/L (ref 39–117)
ALT: 15 IU/L (ref 0–32)
AST: 19 IU/L (ref 0–40)
Albumin/Globulin Ratio: 2.1 (ref 1.1–2.5)
BUN / CREAT RATIO: 13 (ref 9–23)
BUN: 8 mg/dL (ref 6–24)
CALCIUM: 9.6 mg/dL (ref 8.7–10.2)
CHLORIDE: 98 mmol/L (ref 97–108)
CO2: 27 mmol/L (ref 18–29)
Creatinine, Ser: 0.6 mg/dL (ref 0.57–1.00)
GFR calc Af Amer: 124 mL/min/{1.73_m2} (ref >59)
GFR calc non Af Amer: 107 mL/min/{1.73_m2} (ref >59)
Globulin, Total: 2.1 g/dL (ref 1.5–4.5)
Glucose: 105 mg/dL — ABNORMAL HIGH (ref 65–99)
Potassium: 4.3 mmol/L (ref 3.5–5.2)
SODIUM: 139 mmol/L (ref 134–144)
TOTAL PROTEIN: 6.5 g/dL (ref 6.0–8.5)

## 2014-10-17 LAB — LIPID PANEL
CHOL/HDL RATIO: 2.4 ratio (ref 0.0–4.4)
Cholesterol, Total: 143 mg/dL (ref 100–199)
HDL: 59 mg/dL (ref >39)
LDL CALC: 53 mg/dL (ref 0–99)
Triglycerides: 157 mg/dL — ABNORMAL HIGH (ref 0–149)
VLDL CHOLESTEROL CAL: 31 mg/dL (ref 5–40)

## 2014-12-08 ENCOUNTER — Encounter: Payer: Self-pay | Admitting: Family

## 2014-12-08 ENCOUNTER — Ambulatory Visit (INDEPENDENT_AMBULATORY_CARE_PROVIDER_SITE_OTHER): Payer: Medicaid Other | Admitting: Family

## 2014-12-08 VITALS — BP 134/90 | HR 88 | Temp 97.4°F | Ht 66.25 in | Wt 121.0 lb

## 2014-12-08 DIAGNOSIS — H6693 Otitis media, unspecified, bilateral: Secondary | ICD-10-CM | POA: Diagnosis not present

## 2014-12-08 MED ORDER — AMOXICILLIN-POT CLAVULANATE 875-125 MG PO TABS: 1.0000 | ORAL_TABLET | Freq: Two times a day (BID) | ORAL | Status: DC

## 2014-12-08 NOTE — Progress Notes (Signed)
   Subjective:    Patient ID: [name], female    DOB: 29-Sep-1965, 49 y.o.   MRN: [id number]  Otalgia  There is pain in the left ear. This is a new problem. The current episode started in the past 7 days (Monday). The problem has been unchanged. There has been no fever. The pain is at a severity of 7/10. The pain is moderate. Associated symptoms include coughing, ear discharge and rhinorrhea. Pertinent negatives include no headaches, hearing loss or sore throat. She has tried nothing for the symptoms. The treatment provided no relief.      Review of Systems  Constitutional: Negative.   HENT: Positive for ear discharge, ear pain and rhinorrhea. Negative for hearing loss and sore throat.   Eyes: Negative.   Respiratory: Positive for cough. Negative for shortness of breath.   Cardiovascular: Negative.  Negative for palpitations.  Gastrointestinal: Negative.   Endocrine: Negative.   Genitourinary: Negative.   Musculoskeletal: Negative.   Neurological: Negative.  Negative for headaches.  Hematological: Negative.   Psychiatric/Behavioral: Negative.   All other systems reviewed and are negative.      Objective:   Physical Exam  Constitutional: She is oriented to person, place, and time. She appears well-developed and well-nourished. No distress.  HENT:  Head: Normocephalic and atraumatic.  Right Ear: There is drainage, swelling and tenderness.  Left Ear: There is drainage, swelling and tenderness. Tympanic membrane is perforated and erythematous.  Mouth/Throat: Oropharynx is clear and moist.  Eyes: Pupils are equal, round, and reactive to light.  Neck: Normal range of motion. Neck supple. No thyromegaly present.  Cardiovascular: Normal rate, regular rhythm, normal heart sounds and intact distal pulses.   No murmur heard. Pulmonary/Chest: Effort normal and breath sounds normal. No respiratory distress. She has no wheezes.  Abdominal: Soft. Bowel sounds are normal. She exhibits no  distension. There is no tenderness.  Musculoskeletal: Normal range of motion. She exhibits no edema or tenderness.  Neurological: She is alert and oriented to person, place, and time. She has normal reflexes. No cranial nerve deficit.  Skin: Skin is warm and dry.  Psychiatric: She has a normal mood and affect. Her behavior is normal. Judgment and thought content normal.  Vitals reviewed.     BP 134/90 mmHg  Pulse 88  Temp(Src) 97.4 F (36.3 C) (Oral)  Ht 5' 6.25" (1.683 m)  Wt 121 lb (54.885 kg)  BMI 19.38 kg/m2     Assessment & Plan:  1. Bilateral acute otitis media, recurrence not specified, unspecified otitis media type Tylenol prn for pain -Do not pick at ear -Keep ears clean and dry -RTO prn  - amoxicillin-clavulanate (AUGMENTIN) 875-125 MG per tablet; Take 1 tablet by mouth 2 (two) times daily.  Dispense: 20 tablet; Refill: 0  Evelina Dun, FNP

## 2014-12-08 NOTE — Patient Instructions (Signed)

## 2014-12-28 ENCOUNTER — Emergency Department (HOSPITAL_COMMUNITY): Payer: Medicaid Other

## 2014-12-28 ENCOUNTER — Encounter (HOSPITAL_COMMUNITY): Payer: Self-pay | Admitting: Emergency Medicine

## 2014-12-28 ENCOUNTER — Inpatient Hospital Stay (HOSPITAL_COMMUNITY)
Admission: EM | Admit: 2014-12-28 | Discharge: 2014-12-30 | DRG: 517 | Disposition: A | Discharge status: Home / Self Care | Payer: Medicaid Other | Attending: Orthopedic Surgery | Admitting: Orthopedic Surgery

## 2014-12-28 DIAGNOSIS — S82009A Unspecified fracture of unspecified patella, initial encounter for closed fracture: Secondary | ICD-10-CM | POA: Diagnosis present

## 2014-12-28 DIAGNOSIS — W010XXA Fall on same level from slipping, tripping and stumbling without subsequent striking against object, initial encounter: Secondary | ICD-10-CM | POA: Diagnosis present

## 2014-12-28 DIAGNOSIS — S82002A Unspecified fracture of left patella, initial encounter for closed fracture: Secondary | ICD-10-CM | POA: Diagnosis not present

## 2014-12-28 DIAGNOSIS — I10 Essential (primary) hypertension: Secondary | ICD-10-CM | POA: Diagnosis present

## 2014-12-28 DIAGNOSIS — M25562 Pain in left knee: Secondary | ICD-10-CM | POA: Diagnosis present

## 2014-12-28 DIAGNOSIS — Y92512 Supermarket, store or market as the place of occurrence of the external cause: Secondary | ICD-10-CM | POA: Diagnosis not present

## 2014-12-28 DIAGNOSIS — F17200 Nicotine dependence, unspecified, uncomplicated: Secondary | ICD-10-CM | POA: Diagnosis present

## 2014-12-28 DIAGNOSIS — S92912A Unspecified fracture of left toe(s), initial encounter for closed fracture: Secondary | ICD-10-CM | POA: Diagnosis present

## 2014-12-28 DIAGNOSIS — S82042A Displaced comminuted fracture of left patella, initial encounter for closed fracture: Secondary | ICD-10-CM | POA: Diagnosis present

## 2014-12-28 DIAGNOSIS — S82001H Unspecified fracture of right patella, subsequent encounter for open fracture type I or II with delayed healing: Secondary | ICD-10-CM

## 2014-12-28 LAB — BASIC METABOLIC PANEL
ANION GAP: 8 (ref 5–15)
BUN: 11 mg/dL (ref 6–20)
CALCIUM: 8.6 mg/dL — AB (ref 8.9–10.3)
CHLORIDE: 104 mmol/L (ref 101–111)
CO2: 27 mmol/L (ref 22–32)
Creatinine, Ser: 0.63 mg/dL (ref 0.44–1.00)
GFR calc Af Amer: >60 mL/min (ref >60)
GFR calc non Af Amer: >60 mL/min (ref >60)
GLUCOSE: 95 mg/dL (ref 65–99)
POTASSIUM: 3.8 mmol/L (ref 3.5–5.1)
SODIUM: 139 mmol/L (ref 135–145)

## 2014-12-28 LAB — CBC WITH DIFFERENTIAL/PLATELET
Basophils Absolute: 0 10*3/uL (ref 0.0–0.1)
Basophils Relative: 0 % (ref 0–1)
EOS ABS: 0.2 10*3/uL (ref 0.0–0.7)
Eosinophils Relative: 2 % (ref 0–5)
HCT: 39.9 % (ref 36.0–46.0)
Hemoglobin: 13.3 g/dL (ref 12.0–15.0)
LYMPHS ABS: 1.8 10*3/uL (ref 0.7–4.0)
LYMPHS PCT: 21 % (ref 12–46)
MCH: 32.4 pg (ref 26.0–34.0)
MCHC: 33.3 g/dL (ref 30.0–36.0)
MCV: 97.1 fL (ref 78.0–100.0)
Monocytes Absolute: 0.9 10*3/uL (ref 0.1–1.0)
Monocytes Relative: 10 % (ref 3–12)
NEUTROS ABS: 5.8 10*3/uL (ref 1.7–7.7)
NEUTROS PCT: 67 % (ref 43–77)
PLATELETS: 258 10*3/uL (ref 150–400)
RBC: 4.11 MIL/uL (ref 3.87–5.11)
RDW: 12.5 % (ref 11.5–15.5)
WBC: 8.8 10*3/uL (ref 4.0–10.5)

## 2014-12-28 LAB — PROTIME-INR
INR: 1.1 (ref 0.00–1.49)
Prothrombin Time: 14.4 seconds (ref 11.6–15.2)

## 2014-12-28 MED ORDER — SODIUM CHLORIDE 0.9 % IV SOLN
INTRAVENOUS | Status: DC
  Administered 2014-12-28: via INTRAVENOUS

## 2014-12-28 MED ORDER — HYDROMORPHONE HCL 1 MG/ML IJ SOLN
0.5000 mg | INTRAMUSCULAR | Status: DC | PRN
  Administered 2014-12-28 – 2014-12-29 (×2): 0.5 mg via INTRAVENOUS
  Filled 2014-12-28 (×2): qty 1

## 2014-12-28 MED ORDER — ONDANSETRON HCL 4 MG/2ML IJ SOLN: 4.0000 mg | Freq: Four times a day (QID) | INTRAMUSCULAR | Status: DC | PRN

## 2014-12-28 MED ORDER — OXYCODONE-ACETAMINOPHEN 5-325 MG PO TABS: 1.0000 | ORAL_TABLET | ORAL | Status: DC | PRN

## 2014-12-28 MED ORDER — IBUPROFEN 800 MG PO TABS: 800.0000 mg | ORAL_TABLET | Freq: Four times a day (QID) | ORAL | Status: DC

## 2014-12-28 MED ORDER — FENTANYL CITRATE (PF) 100 MCG/2ML IJ SOLN
50.0000 ug | Freq: Once | INTRAMUSCULAR | Status: AC
  Administered 2014-12-28: 50 ug via INTRAVENOUS
  Filled 2014-12-28: qty 2

## 2014-12-28 NOTE — ED Notes (Signed)
MD Rancour at bedside 

## 2014-12-28 NOTE — ED Provider Notes (Signed)
CSN: [id number]     Arrival date & time 12/28/14  1929 History   First MD Initiated Contact with Patient 12/28/14 1940     Chief Complaint  Patient presents with  . Fall     (Consider location/radiation/quality/duration/timing/severity/associated sxs/prior Treatment) HPI Comments: Patient walking out of store when she slipped and fell on some water. Her left knee bent and fell backwards behind her. She fell onto her left knee has abrasions to her left knee and foot. She denies hitting her head or losing consciousness. She denies any neck or back pain. She denies any chest pain or abdominal pain. She complains of pain to her left knee only. Intact distal pulses. Deformity noted by EMS. Patient holding it flexed position unwilling to extend. She does not take any blood thinners.  The history is provided by the patient.    Past Medical History  Diagnosis Date  . Hyperlipidemia   . Hypertension   . Anemia    Past Surgical History  Procedure Laterality Date  . Abdominal hysterectomy      Partial   Family History  Problem Relation Age of Onset  . COPD Mother   . COPD Father    History  Substance Use Topics  . Smoking status: Current Some Day Smoker  . Smokeless tobacco: Not on file  . Alcohol Use: No   OB History    No data available     Review of Systems  Constitutional: Negative for fever, activity change and appetite change.  HENT: Negative for congestion and rhinorrhea.   Respiratory: Negative for cough, chest tightness and shortness of breath.   Cardiovascular: Negative for chest pain.  Gastrointestinal: Negative for nausea, vomiting and abdominal pain.  Genitourinary: Negative for dysuria, hematuria, vaginal bleeding and vaginal discharge.  Musculoskeletal: Positive for myalgias and arthralgias. Negative for back pain, neck pain and neck stiffness.  Skin: Negative for rash.  Neurological: Negative for dizziness, weakness and headaches.  A complete 10 system review of  systems was obtained and all systems are negative except as noted in the HPI and PMH.      Allergies  Codeine and Hydrocodone  Home Medications   Prior to Admission medications   Medication Sig Start Date End Date Taking? Authorizing Provider  Aspirin-Acetaminophen-Caffeine (GOODYS EXTRA STRENGTH) [phone] MG PACK Take 2 packets by mouth daily as needed (pain).   Yes Historical Provider, MD  diphenhydrAMINE (BENADRYL) 25 mg capsule Take 25 mg by mouth every 8 (eight) hours as needed for allergies.   Yes Historical Provider, MD  escitalopram (LEXAPRO) 20 MG tablet Take 1 tablet (20 mg total) by mouth daily. 03/06/14  Yes Sharion Balloon, FNP  Ferrous Gluconate (IRON) 240 (27 FE) MG TABS Take by mouth. OTC   Yes Historical Provider, MD  fexofenadine (ALLER-EASE) 180 MG tablet Take 180 mg by mouth daily. OTC   Yes Historical Provider, MD  simvastatin (ZOCOR) 20 MG tablet Take 1 tablet (20 mg total) by mouth at bedtime. 03/24/14  Yes Sharion Balloon, FNP  amoxicillin-clavulanate (AUGMENTIN) 875-125 MG per tablet Take 1 tablet by mouth 2 (two) times daily. Patient not taking: Reported on 12/28/2014 12/08/14   Sharion Balloon, FNP   BP 129/82 mmHg  Pulse 58  Resp 10  Ht 5\' 6"  (1.676 m)  Wt 121 lb (54.885 kg)  BMI 19.54 kg/m2  SpO2 95% Physical Exam  Constitutional: She is oriented to person, place, and time. She appears well-developed and well-nourished. No distress.  HENT:  Head: Normocephalic and atraumatic.  Mouth/Throat: Oropharynx is clear and moist. No oropharyngeal exudate.  Eyes: Conjunctivae and EOM are normal. Pupils are equal, round, and reactive to light.  Neck: Normal range of motion. Neck supple.  No C spine tenderness  Cardiovascular: Normal rate, regular rhythm, normal heart sounds and intact distal pulses.   No murmur heard. Pulmonary/Chest: Effort normal and breath sounds normal. No respiratory distress.  Abdominal: Soft. There is no tenderness. There is no rebound and  no guarding.  Musculoskeletal: Normal range of motion. She exhibits no edema or tenderness.  No T or L spine tenderness Left knee with deformity of patella. Held in flexed position. Abrasion overlying knee. Abrasion to her left foot. Intact DP and PT pulses. Compartments are soft.  Neurological: She is alert and oriented to person, place, and time. No cranial nerve deficit. She exhibits normal muscle tone. Coordination normal.  No ataxia on finger to nose bilaterally. No pronator drift. 5/5 strength throughout. CN 2-12 intact. Negative Romberg. Equal grip strength. Sensation intact. Gait is normal.   Skin: Skin is warm.  Psychiatric: She has a normal mood and affect. Her behavior is normal.  Nursing note and vitals reviewed.   ED Course  Procedures (including critical care time) Labs Review Labs Reviewed  BASIC METABOLIC PANEL - Abnormal; Notable for the following:    Calcium 8.6 (*)    All other components within normal limits  CBC WITH DIFFERENTIAL/PLATELET  PROTIME-INR    Imaging Review Dg Chest 2 View  12/28/2014   CLINICAL DATA:  Preop for patella fracture.  EXAM: CHEST  2 VIEW  COMPARISON:  05/08/2009  FINDINGS: Hyperinflation which may reflect COPD. There is no edema, consolidation, effusion, or pneumothorax. Normal heart size and mediastinal contours. No acute osseous findings.  IMPRESSION: 1. No active cardiopulmonary disease. 2. Hyperinflation suggesting COPD in this smoker.   Electronically Signed   By: Monte Fantasia M.D.   On: 12/28/2014 22:11   Dg Tibia/fibula Left  12/28/2014   CLINICAL DATA:  49 year old female with fall  EXAM: LEFT TIBIA AND FIBULA - 2 VIEW  COMPARISON:  None.  FINDINGS: There is no evidence of fracture or other focal bone lesions. Soft tissues are unremarkable.  IMPRESSION: No fracture of the tibial or fibula.   Electronically Signed   By: Anner Crete M.D.   On: 12/28/2014 21:03   Dg Knee Complete 4 Views Left  12/28/2014   CLINICAL DATA:  Status  post fall on wet pavement at [facility]. Pain and swelling at the anterior left knee. Initial encounter.  EXAM: LEFT KNEE - COMPLETE 4+ VIEW  COMPARISON:  None.  FINDINGS: There is a markedly displaced comminuted horizontal fracture through the mid patella, demonstrating approximately 6.5 cm of displacement. Two fragments are noted distally, and one fragment proximally.  Underlying soft tissue injury is noted. No additional fractures are seen. Visualized joint spaces are otherwise grossly preserved.  IMPRESSION: Markedly displaced comminuted horizontal fracture through the mid patella, demonstrating 6.5 cm of displacement. Two fragments noted distally, and one fragment proximally.   Electronically Signed   By: Garald Balding M.D.   On: 12/28/2014 21:13   Dg Foot Complete Left  12/28/2014   CLINICAL DATA:  Fall at [facility] with pain and swelling on the top of foot. Initial encounter.  EXAM: LEFT FOOT - COMPLETE 3+ VIEW  COMPARISON:  04/18/11  FINDINGS: Minimally displaced fracture through the fifth proximal phalanx extending into the interphalangeal joint. No dislocation.  Hallux valgus with  bony bunion.  IMPRESSION: Minimally displaced fifth proximal phalanx fracture with interphalangeal joint extension.   Electronically Signed   By: Monte Fantasia M.D.   On: 12/28/2014 21:06     EKG Interpretation   Date/Time:  Thursday December 28 2014 21:43:33 EDT Ventricular Rate:  54 PR Interval:  143 QRS Duration: 77 QT Interval:  444 QTC Calculation: 421 R Axis:   73 Text Interpretation:  Sinus rhythm Atrial premature complex Abnormal  R-wave progression, early transition Minimal ST elevation, inferior leads  No significant change was found Confirmed by Wyvonnia Dusky  MD, Devery Murgia [phone])  on 12/28/2014 10:03:33 PM      MDM   Final diagnoses:  Patella fracture, left, closed, initial encounter  Toe fracture, left, closed, initial encounter   Mechanical fall with left knee injury. Did not hit head or lose  consciousness. No anticoagulation.  Deformity to knee held in flexed position. Neurovascularly intact.  X-ray shows patellar fracture with comminution. Patient also has proximal phalanx fifth toe fracture. IN retrospect, she recalls stubbing her toe several days previously.  Discussed with Dr. Aline Brochure. He states he will plan operative repair in the morning and agrees to admit patient. He requests EKG, CXR and preop labs and he will admit. DP and PT pulse stable on reassessment.    Ezequiel Essex, MD 12/28/14 2303

## 2014-12-28 NOTE — ED Notes (Signed)
MD Rancour at bedside updating patient and family. 

## 2014-12-28 NOTE — ED Notes (Addendum)
Pt was walking into [employer] when she slipped and fell onto LT knee. Deformity noted to LT knee with a small abrasion on knee cap. Pt denies LOC. Denies pain anywhere else. Distal pulses strong. Cap refill brisk. Pt given 500 cc NS and 10 mg Morphine PTA.

## 2014-12-29 ENCOUNTER — Encounter (HOSPITAL_COMMUNITY): Payer: Self-pay | Admitting: *Deleted

## 2014-12-29 ENCOUNTER — Inpatient Hospital Stay (HOSPITAL_COMMUNITY): Payer: Medicaid Other

## 2014-12-29 ENCOUNTER — Inpatient Hospital Stay (HOSPITAL_COMMUNITY): Payer: Medicaid Other | Admitting: Anesthesiology

## 2014-12-29 ENCOUNTER — Encounter (HOSPITAL_COMMUNITY)
Admission: EM | Disposition: A | Discharge status: Home / Self Care | Payer: Self-pay | Source: Home / Self Care | Attending: Orthopedic Surgery

## 2014-12-29 DIAGNOSIS — S82002A Unspecified fracture of left patella, initial encounter for closed fracture: Secondary | ICD-10-CM

## 2014-12-29 HISTORY — PX: ORIF PATELLA: SHX5033

## 2014-12-29 LAB — SURGICAL PCR SCREEN
MRSA, PCR: NEGATIVE
Staphylococcus aureus: NEGATIVE

## 2014-12-29 SURGERY — OPEN REDUCTION INTERNAL FIXATION (ORIF) PATELLA
Anesthesia: General | Site: Knee | Laterality: Left

## 2014-12-29 MED ORDER — ONDANSETRON HCL 4 MG/2ML IJ SOLN: 4.0000 mg | Freq: Four times a day (QID) | INTRAMUSCULAR | Status: DC | PRN

## 2014-12-29 MED ORDER — PANTOPRAZOLE SODIUM 40 MG PO TBEC
40.0000 mg | DELAYED_RELEASE_TABLET | Freq: Two times a day (BID) | ORAL | Status: DC
  Administered 2014-12-29 – 2014-12-30 (×3): 40 mg via ORAL
  Filled 2014-12-29 (×3): qty 1

## 2014-12-29 MED ORDER — ONDANSETRON HCL 4 MG/2ML IJ SOLN
4.0000 mg | Freq: Once | INTRAMUSCULAR | Status: AC | PRN
  Administered 2014-12-29: 4 mg via INTRAVENOUS

## 2014-12-29 MED ORDER — BUPIVACAINE-EPINEPHRINE (PF) 0.5% -1:200000 IJ SOLN
INTRAMUSCULAR | Status: AC
  Filled 2014-12-29: qty 60

## 2014-12-29 MED ORDER — ACETAMINOPHEN 500 MG PO TABS
500.0000 mg | ORAL_TABLET | Freq: Four times a day (QID) | ORAL | Status: DC
  Administered 2014-12-29 – 2014-12-30 (×5): 500 mg via ORAL
  Filled 2014-12-29 (×5): qty 1

## 2014-12-29 MED ORDER — HEPARIN SODIUM (PORCINE) 5000 UNIT/ML IJ SOLN
5000.0000 [IU] | Freq: Three times a day (TID) | INTRAMUSCULAR | Status: DC
  Administered 2014-12-29 – 2014-12-30 (×3): 5000 [IU] via SUBCUTANEOUS
  Filled 2014-12-29 (×3): qty 1

## 2014-12-29 MED ORDER — FENTANYL CITRATE (PF) 100 MCG/2ML IJ SOLN
INTRAMUSCULAR | Status: AC
  Filled 2014-12-29: qty 2

## 2014-12-29 MED ORDER — PROPOFOL 10 MG/ML IV BOLUS
INTRAVENOUS | Status: DC | PRN
  Administered 2014-12-29: 120 mg via INTRAVENOUS

## 2014-12-29 MED ORDER — HYDROMORPHONE HCL 1 MG/ML IJ SOLN
0.5000 mg | INTRAMUSCULAR | Status: DC | PRN
  Administered 2014-12-29: 0.5 mg via INTRAVENOUS
  Administered 2014-12-29 – 2014-12-30 (×2): 1 mg via INTRAVENOUS
  Filled 2014-12-29 (×3): qty 1

## 2014-12-29 MED ORDER — FENTANYL CITRATE (PF) 250 MCG/5ML IJ SOLN
INTRAMUSCULAR | Status: DC | PRN
  Administered 2014-12-29: 50 ug via INTRAVENOUS
  Administered 2014-12-29 (×2): 25 ug via INTRAVENOUS
  Administered 2014-12-29: 50 ug via INTRAVENOUS
  Administered 2014-12-29: 25 ug via INTRAVENOUS
  Administered 2014-12-29: 50 ug via INTRAVENOUS
  Administered 2014-12-29: 25 ug via INTRAVENOUS

## 2014-12-29 MED ORDER — CEFAZOLIN SODIUM-DEXTROSE 2-3 GM-% IV SOLR
2.0000 g | INTRAVENOUS | Status: AC
  Administered 2014-12-29: 2 g via INTRAVENOUS
  Filled 2014-12-29 (×2): qty 50

## 2014-12-29 MED ORDER — ONDANSETRON HCL 4 MG/2ML IJ SOLN
4.0000 mg | Freq: Once | INTRAMUSCULAR | Status: AC
  Administered 2014-12-29: 4 mg via INTRAVENOUS

## 2014-12-29 MED ORDER — LORATADINE 10 MG PO TABS
10.0000 mg | ORAL_TABLET | Freq: Every day | ORAL | Status: DC
  Administered 2014-12-29 – 2014-12-30 (×2): 10 mg via ORAL
  Filled 2014-12-29 (×2): qty 1

## 2014-12-29 MED ORDER — HYDROMORPHONE HCL 1 MG/ML IJ SOLN
0.5000 mg | INTRAMUSCULAR | Status: DC | PRN
  Administered 2014-12-29: 0.5 mg via INTRAVENOUS
  Filled 2014-12-29: qty 1

## 2014-12-29 MED ORDER — BUPIVACAINE-EPINEPHRINE (PF) 0.5% -1:200000 IJ SOLN
INTRAMUSCULAR | Status: DC | PRN
  Administered 2014-12-29: 60 mL via PERINEURAL

## 2014-12-29 MED ORDER — SODIUM CHLORIDE 0.9 % IV SOLN: INTRAVENOUS | Status: DC

## 2014-12-29 MED ORDER — KETOROLAC TROMETHAMINE 15 MG/ML IJ SOLN
15.0000 mg | Freq: Four times a day (QID) | INTRAMUSCULAR | Status: DC
  Administered 2014-12-29 – 2014-12-30 (×4): 15 mg via INTRAVENOUS
  Filled 2014-12-29 (×4): qty 1

## 2014-12-29 MED ORDER — LIDOCAINE HCL 1 % IJ SOLN
INTRAMUSCULAR | Status: DC | PRN
  Administered 2014-12-29: 50 mg via INTRADERMAL

## 2014-12-29 MED ORDER — IBUPROFEN 800 MG PO TABS: 800.0000 mg | ORAL_TABLET | Freq: Four times a day (QID) | ORAL | Status: DC

## 2014-12-29 MED ORDER — ESCITALOPRAM OXALATE 20 MG PO TABS
20.0000 mg | ORAL_TABLET | Freq: Every day | ORAL | Status: DC
  Administered 2014-12-29 – 2014-12-30 (×2): 20 mg via ORAL
  Filled 2014-12-29 (×4): qty 1
  Filled 2014-12-29 (×2): qty 2

## 2014-12-29 MED ORDER — NICOTINE 21 MG/24HR TD PT24
21.0000 mg | MEDICATED_PATCH | Freq: Every day | TRANSDERMAL | Status: DC
Start: 2014-12-29 — End: 2014-12-30
  Administered 2014-12-29: 21 mg via TRANSDERMAL
  Filled 2014-12-29 (×2): qty 1

## 2014-12-29 MED ORDER — SIMVASTATIN 20 MG PO TABS
20.0000 mg | ORAL_TABLET | Freq: Every day | ORAL | Status: DC
  Administered 2014-12-29: 20 mg via ORAL
  Filled 2014-12-29: qty 1

## 2014-12-29 MED ORDER — SODIUM CHLORIDE 0.9 % IV SOLN
INTRAVENOUS | Status: DC
  Administered 2014-12-29 (×2): via INTRAVENOUS

## 2014-12-29 MED ORDER — SODIUM CHLORIDE 0.9 % IR SOLN
Status: DC | PRN
  Administered 2014-12-29: 1000 mL

## 2014-12-29 MED ORDER — MIDAZOLAM HCL 2 MG/2ML IJ SOLN: 1.0000 mg | INTRAMUSCULAR | Status: DC | PRN

## 2014-12-29 MED ORDER — FENTANYL CITRATE (PF) 250 MCG/5ML IJ SOLN
INTRAMUSCULAR | Status: AC
  Filled 2014-12-29: qty 5

## 2014-12-29 MED ORDER — FENTANYL CITRATE (PF) 100 MCG/2ML IJ SOLN
25.0000 ug | INTRAMUSCULAR | Status: AC
  Administered 2014-12-29 (×2): 25 ug via INTRAVENOUS

## 2014-12-29 MED ORDER — DIPHENHYDRAMINE HCL 25 MG PO CAPS
25.0000 mg | ORAL_CAPSULE | Freq: Three times a day (TID) | ORAL | Status: DC | PRN
  Administered 2014-12-29 – 2014-12-30 (×3): 25 mg via ORAL
  Filled 2014-12-29 (×3): qty 1

## 2014-12-29 MED ORDER — MIDAZOLAM HCL 2 MG/2ML IJ SOLN
INTRAMUSCULAR | Status: AC
  Filled 2014-12-29: qty 2

## 2014-12-29 MED ORDER — LIDOCAINE HCL (PF) 1 % IJ SOLN
INTRAMUSCULAR | Status: AC
  Filled 2014-12-29: qty 5

## 2014-12-29 MED ORDER — TRAMADOL HCL 50 MG PO TABS
50.0000 mg | ORAL_TABLET | Freq: Four times a day (QID) | ORAL | Status: DC
  Administered 2014-12-29 – 2014-12-30 (×4): 50 mg via ORAL
  Filled 2014-12-29 (×4): qty 1

## 2014-12-29 MED ORDER — LACTATED RINGERS IV SOLN
INTRAVENOUS | Status: DC
  Administered 2014-12-29 (×2): via INTRAVENOUS

## 2014-12-29 MED ORDER — FENTANYL CITRATE (PF) 100 MCG/2ML IJ SOLN
25.0000 ug | INTRAMUSCULAR | Status: DC | PRN
  Administered 2014-12-29: 50 ug via INTRAVENOUS

## 2014-12-29 MED ORDER — HYDROMORPHONE HCL 2 MG PO TABS: 2.0000 mg | ORAL_TABLET | ORAL | Status: DC | PRN

## 2014-12-29 MED ORDER — ONDANSETRON HCL 4 MG/2ML IJ SOLN
INTRAMUSCULAR | Status: AC
  Filled 2014-12-29: qty 2

## 2014-12-29 MED ORDER — PROPOFOL 10 MG/ML IV BOLUS
INTRAVENOUS | Status: AC
  Filled 2014-12-29: qty 20

## 2014-12-29 MED ORDER — CHLORHEXIDINE GLUCONATE 4 % EX LIQD: 60.0000 mL | Freq: Once | CUTANEOUS | Status: DC

## 2014-12-29 MED ORDER — EPHEDRINE SULFATE 50 MG/ML IJ SOLN
INTRAMUSCULAR | Status: DC | PRN
  Administered 2014-12-29: 10 mg via INTRAVENOUS

## 2014-12-29 MED ORDER — ENSURE ENLIVE PO LIQD
237.0000 mL | Freq: Two times a day (BID) | ORAL | Status: DC
  Administered 2014-12-29 – 2014-12-30 (×2): 237 mL via ORAL

## 2014-12-29 SURGICAL SUPPLY — 57 items
BAG HAMPER (MISCELLANEOUS) ×3 IMPLANT
BANDAGE ELASTIC 6 VELCRO NS (GAUZE/BANDAGES/DRESSINGS) ×3 IMPLANT
BANDAGE ESMARK 6X9 LF (GAUZE/BANDAGES/DRESSINGS) ×1 IMPLANT
BIT DRILL 2.8X128 (BIT) ×2 IMPLANT
BIT DRILL 2.8X128MM (BIT) ×1
BLADE 10 SAFETY STRL DISP (BLADE) IMPLANT
BLADE SURG SZ10 CARB STEEL (BLADE) ×3 IMPLANT
BNDG COHESIVE 4X5 TAN STRL (GAUZE/BANDAGES/DRESSINGS) ×3 IMPLANT
BNDG ESMARK 6X9 LF (GAUZE/BANDAGES/DRESSINGS) ×3
CHLORAPREP W/TINT 26ML (MISCELLANEOUS) ×3 IMPLANT
CLOTH BEACON ORANGE TIMEOUT ST (SAFETY) ×3 IMPLANT
COVER LIGHT HANDLE STERIS (MISCELLANEOUS) ×6 IMPLANT
CUFF TOURNIQUET SINGLE 34IN LL (TOURNIQUET CUFF) ×3 IMPLANT
CUFF TOURNIQUET SINGLE 44IN (TOURNIQUET CUFF) IMPLANT
DECANTER SPIKE VIAL GLASS SM (MISCELLANEOUS) ×6 IMPLANT
DRAPE C-ARM FOLDED MOBILE STRL (DRAPES) ×3 IMPLANT
DRAPE INCISE IOBAN 66X45 STRL (DRAPES) ×3 IMPLANT
DRAPE PROXIMA HALF (DRAPES) IMPLANT
DRSG MEPILEX BORDER 4X12 (GAUZE/BANDAGES/DRESSINGS) ×3 IMPLANT
ELECT REM PT RETURN 9FT ADLT (ELECTROSURGICAL) ×3
ELECTRODE REM PT RTRN 9FT ADLT (ELECTROSURGICAL) ×1 IMPLANT
GAUZE SPONGE 4X4 12PLY STRL (GAUZE/BANDAGES/DRESSINGS) IMPLANT
GLOVE BIOGEL M 7.0 STRL (GLOVE) ×3 IMPLANT
GLOVE BIOGEL PI IND STRL 7.0 (GLOVE) ×2 IMPLANT
GLOVE BIOGEL PI INDICATOR 7.0 (GLOVE) ×4
GLOVE EXAM NITRILE MD LF STRL (GLOVE) ×3 IMPLANT
GLOVE SKINSENSE NS SZ8.0 LF (GLOVE) ×2
GLOVE SKINSENSE STRL SZ8.0 LF (GLOVE) ×1 IMPLANT
GLOVE SS N UNI LF 8.5 STRL (GLOVE) ×3 IMPLANT
GLOVE SURG SS PI 7.0 STRL IVOR (GLOVE) ×3 IMPLANT
GOWN STRL REUS W/TWL LRG LVL3 (GOWN DISPOSABLE) ×6 IMPLANT
GOWN STRL REUS W/TWL XL LVL3 (GOWN DISPOSABLE) ×3 IMPLANT
IMMOBILIZER KNEE 19 UNV (ORTHOPEDIC SUPPLIES) IMPLANT
INST SET MAJOR BONE (KITS) ×3 IMPLANT
K-WIRE DBL PT .062 (WIRE) ×9 IMPLANT
KIT ROOM TURNOVER APOR (KITS) ×3 IMPLANT
MANIFOLD NEPTUNE II (INSTRUMENTS) ×3 IMPLANT
NEEDLE HYPO 21X1.5 SAFETY (NEEDLE) ×3 IMPLANT
NS IRRIG 1000ML POUR BTL (IV SOLUTION) ×3 IMPLANT
PACK BASIC LIMB (CUSTOM PROCEDURE TRAY) ×3 IMPLANT
PAD ARMBOARD 7.5X6 YLW CONV (MISCELLANEOUS) ×3 IMPLANT
PASSER SUT SWANSON 36MM LOOP (INSTRUMENTS) ×3 IMPLANT
SET BASIN LINEN APH (SET/KITS/TRAYS/PACK) ×3 IMPLANT
SPONGE LAP 18X18 X RAY DECT (DISPOSABLE) ×6 IMPLANT
STAPLER VISISTAT 35W (STAPLE) ×3 IMPLANT
SUT BRALON NAB BRD #1 30IN (SUTURE) IMPLANT
SUT ETHIBOND 5 LR DA (SUTURE) ×3 IMPLANT
SUT MNCRL 0 VIOLET CTX 36 (SUTURE) ×1 IMPLANT
SUT MON AB 0 CT1 (SUTURE) ×3 IMPLANT
SUT MON AB 2-0 CT1 36 (SUTURE) IMPLANT
SUT MONOCRYL 0 CTX 36 (SUTURE) ×2
SUT VIC AB 1 CT1 27 (SUTURE) ×2
SUT VIC AB 1 CT1 27XBRD ANTBC (SUTURE) ×1 IMPLANT
SUT WIRE 18GA (Wire) ×3 IMPLANT
SYR 30ML LL (SYRINGE) ×3 IMPLANT
SYR BULB IRRIGATION 50ML (SYRINGE) ×6 IMPLANT
TOWEL OR 17X26 4PK STRL BLUE (TOWEL DISPOSABLE) ×3 IMPLANT

## 2014-12-29 NOTE — H&P (Signed)
[name] is an 49 y.o. female.   Chief Complaint: left knee pain  HPI: 49 yo female fell at [place]  Left knee pain  X 1 day  Severe Aching Stiffness in knee   Past Medical History  Diagnosis Date  . Hyperlipidemia   . Hypertension   . Anemia     Past Surgical History  Procedure Laterality Date  . Abdominal hysterectomy      Partial    Family History  Problem Relation Age of Onset  . COPD Mother   . COPD Father    Social History:  reports that she has been smoking.  She does not have any smokeless tobacco history on file. She reports that she does not drink alcohol or use illicit drugs.  Allergies:  Allergies  Allergen Reactions  . Codeine Other (See Comments)    Makes pt.have seizure  . Hydrocodone     May have caused a seizure per patient reported.    Medications Prior to Admission  Medication Sig Dispense Refill  . Aspirin-Acetaminophen-Caffeine (GOODYS EXTRA STRENGTH) 500-325-65 MG PACK Take 2 packets by mouth daily as needed (pain).    Marland Kitchen diphenhydrAMINE (BENADRYL) 25 mg capsule Take 25 mg by mouth every 8 (eight) hours as needed for allergies.    Marland Kitchen escitalopram (LEXAPRO) 20 MG tablet Take 1 tablet (20 mg total) by mouth daily. 90 tablet 3  . Ferrous Gluconate (IRON) 240 (27 FE) MG TABS Take by mouth. OTC    . fexofenadine (ALLER-EASE) 180 MG tablet Take 180 mg by mouth daily. OTC    . simvastatin (ZOCOR) 20 MG tablet Take 1 tablet (20 mg total) by mouth at bedtime. 90 tablet 3  . amoxicillin-clavulanate (AUGMENTIN) 875-125 MG per tablet Take 1 tablet by mouth 2 (two) times daily. (Patient not taking: Reported on 12/28/2014) 20 tablet 0    Results for orders placed or performed during the hospital encounter of 12/28/14 (from the past 48 hour(s))  CBC with Differential/Platelet     Status: None   Collection Time: 12/28/14 10:08 PM  Result Value Ref Range   WBC 8.8 4.0 - 10.5 K/uL   RBC 4.11 3.87 - 5.11 MIL/uL   Hemoglobin 13.3 12.0 - 15.0 g/dL   HCT  39.9 36.0 - 46.0 %   MCV 97.1 78.0 - 100.0 fL   MCH 32.4 26.0 - 34.0 pg   MCHC 33.3 30.0 - 36.0 g/dL   RDW 12.5 11.5 - 15.5 %   Platelets 258 150 - 400 K/uL   Neutrophils Relative % 67 43 - 77 %   Neutro Abs 5.8 1.7 - 7.7 K/uL   Lymphocytes Relative 21 12 - 46 %   Lymphs Abs 1.8 0.7 - 4.0 K/uL   Monocytes Relative 10 3 - 12 %   Monocytes Absolute 0.9 0.1 - 1.0 K/uL   Eosinophils Relative 2 0 - 5 %   Eosinophils Absolute 0.2 0.0 - 0.7 K/uL   Basophils Relative 0 0 - 1 %   Basophils Absolute 0.0 0.0 - 0.1 K/uL  Basic metabolic panel     Status: Abnormal   Collection Time: 12/28/14 10:08 PM  Result Value Ref Range   Sodium 139 135 - 145 mmol/L   Potassium 3.8 3.5 - 5.1 mmol/L   Chloride 104 101 - 111 mmol/L   CO2 27 22 - 32 mmol/L   Glucose, Bld 95 65 - 99 mg/dL   BUN 11 6 - 20 mg/dL   Creatinine, Ser 0.63  0.44 - 1.00 mg/dL   Calcium 8.6 (L) 8.9 - 10.3 mg/dL   GFR calc non Af Amer >60 >60 mL/min   GFR calc Af Amer >60 >60 mL/min    Comment: (NOTE) The eGFR has been calculated using the CKD EPI equation. This calculation has not been validated in all clinical situations. eGFR's persistently <60 mL/min signify possible Chronic Kidney Disease.    Anion gap 8 5 - 15  Protime-INR     Status: None   Collection Time: 12/28/14 10:08 PM  Result Value Ref Range   Prothrombin Time 14.4 11.6 - 15.2 seconds   INR 1.10 0.00 - 1.49  Surgical pcr screen     Status: None   Collection Time: 12/29/14  4:13 AM  Result Value Ref Range   MRSA, PCR NEGATIVE NEGATIVE   Staphylococcus aureus NEGATIVE NEGATIVE    Comment:        The Xpert SA Assay (FDA approved for NASAL specimens in patients over 15 years of age), is one component of a comprehensive surveillance program.  Test performance has been validated by [facility] for patients greater than or equal to 44 year old. It is not intended to diagnose infection nor to guide or monitor treatment.    Dg Chest 2 View  12/28/2014    CLINICAL DATA:  Preop for patella fracture.  EXAM: CHEST  2 VIEW  COMPARISON:  05/08/2009  FINDINGS: Hyperinflation which may reflect COPD. There is no edema, consolidation, effusion, or pneumothorax. Normal heart size and mediastinal contours. No acute osseous findings.  IMPRESSION: 1. No active cardiopulmonary disease. 2. Hyperinflation suggesting COPD in this smoker.   Electronically Signed   By: Monte Fantasia M.D.   On: 12/28/2014 22:11   Dg Tibia/fibula Left  12/28/2014   CLINICAL DATA:  49 year old female with fall  EXAM: LEFT TIBIA AND FIBULA - 2 VIEW  COMPARISON:  None.  FINDINGS: There is no evidence of fracture or other focal bone lesions. Soft tissues are unremarkable.  IMPRESSION: No fracture of the tibial or fibula.   Electronically Signed   By: Anner Crete M.D.   On: 12/28/2014 21:03   Dg Knee Complete 4 Views Left  12/28/2014   CLINICAL DATA:  Status post fall on wet pavement at [facility]. Pain and swelling at the anterior left knee. Initial encounter.  EXAM: LEFT KNEE - COMPLETE 4+ VIEW  COMPARISON:  None.  FINDINGS: There is a markedly displaced comminuted horizontal fracture through the mid patella, demonstrating approximately 6.5 cm of displacement. Two fragments are noted distally, and one fragment proximally.  Underlying soft tissue injury is noted. No additional fractures are seen. Visualized joint spaces are otherwise grossly preserved.  IMPRESSION: Markedly displaced comminuted horizontal fracture through the mid patella, demonstrating 6.5 cm of displacement. Two fragments noted distally, and one fragment proximally.   Electronically Signed   By: Garald Balding M.D.   On: 12/28/2014 21:13   Dg Foot Complete Left  12/28/2014   CLINICAL DATA:  Fall at [facility] with pain and swelling on the top of foot. Initial encounter.  EXAM: LEFT FOOT - COMPLETE 3+ VIEW  COMPARISON:  04/18/11  FINDINGS: Minimally displaced fracture through the fifth proximal phalanx extending into the interphalangeal  joint. No dislocation.  Hallux valgus with bony bunion.  IMPRESSION: Minimally displaced fifth proximal phalanx fracture with interphalangeal joint extension.   Electronically Signed   By: Monte Fantasia M.D.   On: 12/28/2014 21:06    Review of Systems  All  other systems reviewed and are negative.   Blood pressure 111/60, pulse 66, temperature 97.4 F (36.3 C), temperature source Oral, resp. rate 16, height _0  (1.676 m), weight 123 lb 14.4 oz (56.2 kg), SpO2 98 %. Physical Exam  Nursing note and vitals reviewed. Constitutional: She is oriented to person, place, and time. She appears well-developed and well-nourished. No distress.  HENT:  Head: Normocephalic and atraumatic.  Eyes: Pupils are equal, round, and reactive to light.  Neck: Normal range of motion. Neck supple. No JVD present. No tracheal deviation present. No thyromegaly present.  Cardiovascular: Normal rate.   Respiratory: Effort normal.  GI: Soft. She exhibits no distension. There is no tenderness.  Musculoskeletal:  Normal upper extremities and right lower extremity  Left knee gap at fracture site cant slr Abrasion on foot lesser digit fracture  Muscle tone normal  Knee stability cant be confirmed   Lymphadenopathy:    She has no cervical adenopathy.  Neurological: She is alert and oriented to person, place, and time. She has normal reflexes.  Skin: Skin is warm and dry. She is not diaphoretic.  Several abrasions  Psychiatric: She has a normal mood and affect. Her behavior is normal. Judgment and thought content normal.     Assessment/Plan Displaced left patella fracture.  Open treatment internal fixation left patella fracture  Note I reviewed the risks and benefits of the procedure and the possibility for hardware removal in the future in the presence of the patient's sister with her consent. Although she says she has slow mentation there is no guardian and she signs her own papers.  Arther Abbott 12/29/2014, 7:33 AM

## 2014-12-29 NOTE — Brief Op Note (Signed)
12/28/2014 - 12/29/2014  11:13 AM  PATIENT:  [name]  49 y.o. female  PRE-OPERATIVE DIAGNOSIS:  left patella fracture  POST-OPERATIVE DIAGNOSIS:  left patella fracture  PROCEDURE:  Procedure(s): OPEN REDUCTION INTERNAL FIXATION LEFT PATELLA (Left)  Findings at surgery. Comminuted 3 part fracture of the patella with significant displacement.  Hardware 3-6.2 K wires and one 18-gauge wire and one #5 Tycron suture SURGEON:  Surgeon(s) and Role:    * Carole Civil, MD - Primary  I was assisted by Corrie Dandy  Anesthesia with general anesthesia  No blood loss  60 mL of Marcaine local injection  No specimens  Clean case   EBL:  Total I/O In: 1000 [I.V.:1000] Out: 20 [Blood:20]  No blood was administered  No drains  Count was correct  TOURNIQUET:   Total Tourniquet Time Documented: Thigh (Left) - 74 minutes Total: Thigh (Left) - 74 minutes   DICTATION: .Viviann Spare Dictation  PLAN OF CARE: Admit for overnight observation  PATIENT DISPOSITION:  PACU - hemodynamically stable.   Delay start of Pharmacological VTE agent (>24hrs) due to surgical blood loss or risk of bleeding: not applicable   Details of procedure The patient was identified in the preop area, site marking was completed and surgical site was marked. Chart was reviewed and updated. Patient was taken to the operating room for general anesthesia. She had preoperative anabiotic and then prepped with Betadine secondary to abrasion over the knee.  Timeout was completed  I confirmed hardware in the room that I needed for the surgery and that the x-rays were coinciding with our procedure.  The limb was exsanguinated with a six-inch Esmarch. The tourniquet was elevated to 300 mmHg. A straight incision was made over the patella avoiding the abrasion. Subcutaneous tissue was divided down to the extensor mechanism and we encountered the fracture. The fracture was in 3 parts and it was displaced. We irrigated  the joint inspected found to be relatively normal. We then pinned the fracture fragments and held them with a clamp using 0.62 K wires  We checked this with the C-arm I was satisfied with position. We then placed an 18-gauge wire in a figure-of-eight tension band fashion and then bent and cut the pins and the end of the 18-gauge wire. We flexed the knee several times and there was good apposition of the fracture fragments throughout the range of motion up to 60. The patient will be allowed to bend the knee 0-45 and the early postoperative period in a hinged knee brace.  After irrigation a #5 Ethibond suture was placed as a tension relieving suture on the repair this was placed in the quadriceps tendon rectal retinaculum and patellar tendon.  The joint was irrigated injected including the soft tissues with 60 mL of Marcaine. The retinacular tear was closed with #1 Vicryls suture  Subcutaneous tissue closed with 0 Monocryl and 0 Monocryl  Staples were used to reapproximate the skin edge  Sterile dressing was applied. Brace was applied in extension.  Extubation was completed and she was taken to recovery room in stable condition

## 2014-12-29 NOTE — Anesthesia Preprocedure Evaluation (Addendum)
Anesthesia Evaluation  Patient identified by MRN, date of birth, ID band Patient awake    Reviewed: Allergy & Precautions, NPO status , Patient's Chart, lab work & pertinent test results  Airway Mallampati: I  TM Distance: >3 FB     Dental  (+) Poor Dentition, Partial Upper, Missing, Dental Advisory Given   Pulmonary Current Smoker,  breath sounds clear to auscultation        Cardiovascular hypertension, Rhythm:Regular Rate:Normal     Neuro/Psych PSYCHIATRIC DISORDERS Anxiety    GI/Hepatic negative GI ROS,   Endo/Other    Renal/GU      Musculoskeletal   Abdominal   Peds  Hematology   Anesthesia Other Findings   Reproductive/Obstetrics                            Anesthesia Physical Anesthesia Plan  ASA: II  Anesthesia Plan: General   Post-op Pain Management:    Induction: Intravenous  Airway Management Planned: LMA  Additional Equipment:   Intra-op Plan:   Post-operative Plan: Extubation in OR  Informed Consent: I have reviewed the patients History and Physical, chart, labs and discussed the procedure including the risks, benefits and alternatives for the proposed anesthesia with the patient or authorized representative who has indicated his/her understanding and acceptance.     Plan Discussed with:   Anesthesia Plan Comments:         Anesthesia Quick Evaluation

## 2014-12-29 NOTE — Progress Notes (Signed)
Nutrition Brief Note  Patient identified on the Malnutrition Screening Tool (MST) Report  Wt Readings from Last 15 Encounters:  12/29/14 123 lb (55.792 kg)  12/08/14 121 lb (54.885 kg)  10/16/14 124 lb 6.4 oz (56.427 kg)  03/24/14 133 lb (60.328 kg)  03/06/14 138 lb (62.596 kg)  02/17/14 135 lb 6.4 oz (61.417 kg)    Body mass index is 19.86 kg/(m^2). Patient meets criteria for healthy height/weight ratio based on current BMI.   Pt has had internal fixation of left patella. Is being admitted overnight for observation. Pt reported no recent trouble eating or weight loss.   Current diet order is Regular, no documented intake of meals at this time. Labs and medications reviewed.   Ordered Ensure for extra dietary protein  No further nutrition interventions warranted at this time. If nutrition issues arise, please consult RD.   Burtis Junes RD, LDN Nutrition Pager: [phone] 12/29/2014 4:35 PM

## 2014-12-29 NOTE — Evaluation (Signed)
Physical Therapy Evaluation Patient Details Name: VELTA ROCKHOLT MRN: [id number] DOB: 1966-02-23 Today's Date: 21-Jan-2015   History of Present Illness  Pt is a 49 yo female who fell at [facility] fracturing her Lt knee.  She had an ORIF on 01-21-2015 and is now bering referred to PT for gait training.  No ROM   Clinical Impression  Pt did remarkably well with therapy.  HH to ensure safe home environment IE rugs pulled up; but should not be needed for long.     Follow Up Recommendations Home health PT    Equipment Recommendations  Rolling walker with 5" wheels    Recommendations for Other Services   none    Precautions / Restrictions Precautions Precautions: None Required Braces or Orthoses: Knee Immobilizer - Left Knee Immobilizer - Left: On when out of bed or walking      Mobility  Bed Mobility Overal bed mobility: Modified Independent                Transfers Overall transfer level: Modified independent Equipment used: Rolling walker (2 wheeled)                Ambulation/Gait Ambulation/Gait assistance: Supervision Ambulation Distance (Feet): 50 Feet Assistive device: Rolling walker (2 wheeled) Gait Pattern/deviations: Step-to pattern   Gait velocity interpretation: Below normal speed for age/gender              Pertinent Vitals/Pain Pain Assessment: 0-10 Pain Score: 5  Pain Descriptors / Indicators: Aching Pain Intervention(s): Monitored during session    Home Living Family/patient expects to be discharged to:: Private residence Living Arrangements: Parent Available Help at Discharge: Family Type of Home: House Home Access: Stairs to enter   Technical brewer of Steps: 1 Home Layout: One level Home Equipment: None      Prior Function Level of Independence: Independent                  Extremity/Trunk Assessment               Lower Extremity Assessment: Overall WFL for tasks assessed         Communication   Communication: No difficulties  Cognition Arousal/Alertness: Awake/alert   Overall Cognitive Status: Within Functional Limits for tasks assessed                               Assessment/Plan    PT Assessment Patient needs continued PT services  PT Diagnosis Difficulty walking   PT Problem List Decreased activity tolerance;Decreased knowledge of use of DME;Pain  PT Treatment Interventions Gait training   PT Goals (Current goals can be found in the Care Plan section) Acute Rehab PT Goals Patient Stated Goal:  To be able to be I with least AD when she goes home PT Goal Formulation: With patient Time For Goal Achievement: 12/30/14 Potential to Achieve Goals: Good    Frequency Min 6X/week           End of Session Equipment Utilized During Treatment: Gait belt Activity Tolerance: Patient tolerated treatment well Patient left: in bed           Time: 1605-1640 PT Time Calculation (min) (ACUTE ONLY): 35 min   Charges:   PT Evaluation $Initial PT Evaluation Tier I: 1 Procedure     PT G Codes:        Jazzlynn Rawe,CINDY 2015-01-21, 4:40 PM

## 2014-12-29 NOTE — Anesthesia Postprocedure Evaluation (Deleted)
  Anesthesia Post-op Note  Patient: [name]  Procedure(s) Performed: Procedure(s): OPEN REDUCTION INTERNAL FIXATION LEFT PATELLA (Left)  Patient Location: PACU  Anesthesia Type:General  Level of Consciousness: awake, alert  and oriented  Airway and Oxygen Therapy: Patient Spontanous Breathing and Patient connected to face mask oxygen  Post-op Pain: none  Post-op Assessment: Post-op Vital signs reviewed, Patient's Cardiovascular Status Stable, Respiratory Function Stable, Patent Airway, No signs of Nausea or vomiting and Adequate PO intake              Post-op Vital Signs: Reviewed and stable  Last Vitals:  Filed Vitals:   12/29/14 0841  BP: 122/86  Pulse: 74  Temp: 36.6 C  Resp: 21    Complications: No apparent anesthesia complications

## 2014-12-29 NOTE — Op Note (Signed)
12/28/2014 - 12/29/2014  11:13 AM  PATIENT:  [name]  49 y.o. female  PRE-OPERATIVE DIAGNOSIS:  left patella fracture  POST-OPERATIVE DIAGNOSIS:  left patella fracture  PROCEDURE:  Procedure(s): OPEN REDUCTION INTERNAL FIXATION LEFT PATELLA (Left)  Findings at surgery. Comminuted 3 part fracture of the patella with significant displacement.  Hardware 3-6.2 K wires and one 18-gauge wire and one #5 Tycron suture SURGEON:  Surgeon(s) and Role:    * Carole Civil, MD - Primary  I was assisted by Corrie Dandy  Anesthesia with general anesthesia  No blood loss  60 mL of Marcaine local injection  No specimens  Clean case   EBL:  Total I/O In: 1000 [I.V.:1000] Out: 20 [Blood:20]  No blood was administered  No drains  Count was correct  TOURNIQUET:   Total Tourniquet Time Documented: Thigh (Left) - 74 minutes Total: Thigh (Left) - 74 minutes   DICTATION: .Viviann Spare Dictation  PLAN OF CARE: Admit for overnight observation  PATIENT DISPOSITION:  PACU - hemodynamically stable.   Delay start of Pharmacological VTE agent (>24hrs) due to surgical blood loss or risk of bleeding: not applicable   Details of procedure The patient was identified in the preop area, site marking was completed and surgical site was marked. Chart was reviewed and updated. Patient was taken to the operating room for general anesthesia. She had preoperative anabiotic and then prepped with Betadine secondary to abrasion over the knee.  Timeout was completed  I confirmed hardware in the room that I needed for the surgery and that the x-rays were coinciding with our procedure.  The limb was exsanguinated with a six-inch Esmarch. The tourniquet was elevated to 300 mmHg. A straight incision was made over the patella avoiding the abrasion. Subcutaneous tissue was divided down to the extensor mechanism and we encountered the fracture. The fracture was in 3 parts and it was displaced. We irrigated  the joint inspected found to be relatively normal. We then pinned the fracture fragments and held them with a clamp using 0.62 K wires  We checked this with the C-arm I was satisfied with position. We then placed an 18-gauge wire in a figure-of-eight tension band fashion and then bent and cut the pins and the end of the 18-gauge wire. We flexed the knee several times and there was good apposition of the fracture fragments throughout the range of motion up to 60. The patient will be allowed to bend the knee 0-45 and the early postoperative period in a hinged knee brace.  After irrigation a #5 Ethibond suture was placed as a tension relieving suture on the repair this was placed in the quadriceps tendon rectal retinaculum and patellar tendon.  Final x-rays confirmed reduction and hardware position to be satisfactory  The joint was irrigated injected including the soft tissues with 60 mL of Marcaine. The retinacular tear was closed with #1 Vicryls suture  Subcutaneous tissue closed with 0 Monocryl and 0 Monocryl  Staples were used to reapproximate the skin edge  Sterile dressing was applied. Brace was applied in extension.  Extubation was completed and she was taken to recovery room in stable condition

## 2014-12-29 NOTE — Anesthesia Procedure Notes (Signed)
Procedure Name: LMA Insertion Date/Time: 12/29/2014 9:36 AM Performed by: Tressie Stalker E Pre-anesthesia Checklist: Patient identified, Patient being monitored, Emergency Drugs available, Timeout performed and Suction available Patient Re-evaluated:Patient Re-evaluated prior to inductionOxygen Delivery Method: Circle System Utilized Preoxygenation: Pre-oxygenation with 100% oxygen Intubation Type: IV induction Ventilation: Mask ventilation without difficulty LMA: LMA inserted LMA Size: 3.0 Number of attempts: 1 Placement Confirmation: positive ETCO2 and breath sounds checked- equal and bilateral

## 2014-12-29 NOTE — Transfer of Care (Signed)
Immediate Anesthesia Transfer of Care Note  Patient: [name]  Procedure(s) Performed: Procedure(s): OPEN REDUCTION INTERNAL FIXATION LEFT PATELLA (Left)  Patient Location: PACU  Anesthesia Type:General  Level of Consciousness: awake, alert  and oriented  Airway & Oxygen Therapy: Patient Spontanous Breathing and Patient connected to face mask oxygen  Post-op Assessment: Report given to RN  Post vital signs: Reviewed and stable  Last Vitals:  Filed Vitals:   12/29/14 0841  BP: 122/86  Pulse: 74  Temp: 36.6 C  Resp: 21    Complications: No apparent anesthesia complications

## 2014-12-29 NOTE — Anesthesia Postprocedure Evaluation (Signed)
  Anesthesia Post-op Note  Patient: [name]  Procedure(s) Performed: Procedure(s): OPEN REDUCTION INTERNAL FIXATION LEFT PATELLA (Left)  Patient Location: PACU  Anesthesia Type:General  Level of Consciousness: awake, alert  and oriented  Airway and Oxygen Therapy: Patient Spontanous Breathing  Post-op Pain: mild  Post-op Assessment: Post-op Vital signs reviewed              Post-op Vital Signs: Reviewed and stable  Last Vitals:  Filed Vitals:   12/29/14 1145  BP: 119/75  Pulse: 96  Temp:   Resp: 4    Complications: No apparent anesthesia complications

## 2014-12-30 MED ORDER — HYDROMORPHONE HCL 2 MG PO TABS: 2.0000 mg | ORAL_TABLET | ORAL | Status: DC | PRN

## 2014-12-30 MED ORDER — TRAMADOL HCL 50 MG PO TABS: 50.0000 mg | ORAL_TABLET | Freq: Four times a day (QID) | ORAL | Status: DC | PRN

## 2014-12-30 NOTE — Progress Notes (Signed)
Per physical therapy notes patient needs home health PT. No order was given for discharge. Dr. Aline Brochure called and order was given, but physician is not at a computer at this time to do face to face. He will do face to face when he gets to a computer.

## 2014-12-30 NOTE — Progress Notes (Signed)
Physical Therapy Treatment Patient Details Name: [name] MRN: [id number] DOB: 1965-12-20 Today's Date: 12/30/2014    History of Present Illness      PT Comments    Pt pleasant and eager to participate.  Pt independent with bed mobility and min guard with transfer following cueing for hand and Lt LE placement prior.  Gait training complete with cueing for heel to toe and to increase stride length for more normalized gait mechanics, with immobilizer on knee.  Pt stated she felt nausea during gait, pt sat in wheelchair and given ginger ale until stated she felt better.  Stair training instructed with step to patten, pt able to demonstrate appropriate technique following cueing and able to verbalize appropriate pattern.  Pt left in chair upon return with call bell within reach and ice applied to knee.  Pt educated on importance of re-velcroing immobilizer upon removal of ice.  Pt called RN for pain medication following gait.    Follow Up Recommendations        Equipment Recommendations       Recommendations for Other Services       Precautions / Restrictions Precautions Precautions: None Required Braces or Orthoses: Knee Immobilizer - Left Knee Immobilizer - Left: On when out of bed or walking    Mobility  Bed Mobility Overal bed mobility: Independent                Transfers Overall transfer level: Modified independent Equipment used: Rolling walker (2 wheeled)             General transfer comment: Cueing for hand placement prior sit to stand  Ambulation/Gait Ambulation/Gait assistance: Supervision Ambulation Distance (Feet): 260 Feet (130x 2) Assistive device: Rolling walker (2 wheeled) Gait Pattern/deviations: Decreased step length - right;Decreased stance time - left   Gait velocity interpretation: Below normal speed for age/gender     Stairs Stairs: Yes Stairs assistance: Min guard Stair Management: One rail Left Number of Stairs: 3 General  stair comments: Cueing for step to pattern  Wheelchair Mobility    Modified Rankin (Stroke Patients Only)       Balance                                    Cognition Arousal/Alertness: Awake/alert Behavior During Therapy: WFL for tasks assessed/performed Overall Cognitive Status: Within Functional Limits for tasks assessed                      Exercises      General Comments        Pertinent Vitals/Pain Pain Score: 7  Pain Location: Lt knee following gait Pain Descriptors / Indicators: Aching    Home Living                      Prior Function            PT Goals (current goals can now be found in the care plan section) Progress towards PT goals: Progressing toward goals    Frequency       PT Plan Current plan remains appropriate    Co-evaluation             End of Session Equipment Utilized During Treatment: Gait belt Activity Tolerance: Patient tolerated treatment well Patient left: in chair;with call bell/phone within reach (Ice applied)     Time: 9242-6834 PT Time Calculation (min) (  ACUTE ONLY): 43 min  Charges:  $Gait Training: 23-37 mins $Therapeutic Activity: 8-22 mins                    G Codes:      Aldona Lento 12/30/2014, 11:57 AM

## 2014-12-30 NOTE — Discharge Summary (Signed)
Physician Discharge Summary  Patient ID: [name] MRN: [id number] DOB/AGE: 1966/02/09 49 y.o.  Admit date: 12/28/2014 Discharge date: 12/30/2014  Admission Diagnoses: Left patella fracture closed  Discharge Diagnoses: Same Active Problems:   Patella fracture   Discharged Condition: stable  Hospital Course:  The patient was admitted on July 7 with a comminuted left patella fracture. July 8 had uncomplicated open treatment internal fixation of the left patella with tension band construct. Had physical therapy walked 50 feet July 9 was discharged in stable condition     Discharge Exam: Blood pressure 126/75, pulse 92, temperature 98.3 F (36.8 C), temperature source Oral, resp. rate 20, height 5\' 6"  (1.676 m), weight 123 lb (55.792 kg), SpO2 98 %.   Disposition: Final discharge disposition not confirmed  Discharge Instructions    Ambulatory referral to [place]    Complete by:  As directed   Please evaluate [name] for admission to [facility].  Disciplines requested: Physical Therapy  Services to provide: Other: gait training weight bearing as tolerated   Physician to follow patient's care (the person listed here will be responsible for signing ongoing orders): Referring Provider  Requested Start of Care Date: Tomorrow  I certify that this patient is under my care and that I, or a Nurse Practitioner or Physician's Assistant working with me, had a face-to-face encounter that meets the physician face-to-face requirements with patient on 7.9.16. The encounter with the patient was in whole, or in part for the following medical condition(s) which is the primary reason for home health care (List medical condition). Left patella fracture   Special Instructions:  wbat in brace no ROM exercises with operative knee  Does the patient have Medicare or Medicaid?:  Yes  The encounter with the patient was in whole, or in part, for the following medical condition, which is  the primary reason for home health care:  Patella fracture left knee  Reason for Medically Necessary Home Health Services:  Therapy- Personnel officer, Public librarian  My clinical findings support the need for the above services:  Unable to leave home safely without assistance and/or assistive device  I certify that, based on my findings, the following services are medically necessary home health services:  Physical therapy  Further, I certify that my clinical findings support that this patient is homebound due to:  Ambulates short distances less than 300 feet     Diet - low sodium heart healthy    Complete by:  As directed      Discharge instructions    Complete by:  As directed   Keep brace on operative leg. Weight-bear as tolerated in brace.     Increase activity slowly    Complete by:  As directed             Medication List    TAKE these medications        ALLER-EASE 180 MG tablet  Generic drug:  fexofenadine  Take 180 mg by mouth daily. OTC     amoxicillin-clavulanate 875-125 MG per tablet  Commonly known as:  AUGMENTIN  Take 1 tablet by mouth 2 (two) times daily.     diphenhydrAMINE 25 mg capsule  Commonly known as:  BENADRYL  Take 25 mg by mouth every 8 (eight) hours as needed for allergies.     escitalopram 20 MG tablet  Commonly known as:  LEXAPRO  Take 1 tablet (20 mg total) by mouth daily.     GOODYS  EXTRA STRENGTH 500-325-65 MG Pack  Generic drug:  Aspirin-Acetaminophen-Caffeine  Take 2 packets by mouth daily as needed (pain).     HYDROmorphone 2 MG tablet  Commonly known as:  DILAUDID  Take 1 tablet (2 mg total) by mouth every 4 (four) hours as needed for severe pain.     Iron 240 (27 FE) MG Tabs  Take by mouth. OTC     simvastatin 20 MG tablet  Commonly known as:  ZOCOR  Take 1 tablet (20 mg total) by mouth at bedtime.     traMADol 50 MG tablet  Commonly known as:  ULTRAM  Take 1 tablet (50 mg total) by mouth every 6 (six) hours  as needed.           Follow-up Information    Follow up with Arther Abbott, MD On 01/10/2015.   Specialties:  Orthopedic Surgery, Radiology   Why:  afternoon 145   Contact information:   [address] [place] [address] [phone]       Signed: Arther Abbott 12/30/2014, 9:43 AM

## 2014-12-30 NOTE — Progress Notes (Signed)
Pt's IV catheter removed and intact. Pt's IV site clean dry and intact. Discharge instructions, follow up appointments and medications reviewed and discussed with patient. All questions were answered and no further questions at this time. Pt in stable condition at time of discharge and in no acute distress. Pt escorted by nurse tech.

## 2015-01-01 ENCOUNTER — Encounter (HOSPITAL_COMMUNITY): Payer: Self-pay | Admitting: Orthopedic Surgery

## 2015-01-02 ENCOUNTER — Telehealth: Payer: Self-pay | Admitting: Orthopedic Surgery

## 2015-01-02 NOTE — Telephone Encounter (Signed)
See wed morning

## 2015-01-02 NOTE — Telephone Encounter (Signed)
Patient has been given her hospital follow up appointment (scheduled 01/10/15) following procedure on left knee, date of surgery 12/29/14.  She is concerned about amount of swelling on left knee.  States she had been wearing a type of sock while in the hospital, and when she removed sock, she noticed the swelling, and wants to make sure it is normal.  States she is "trying to not walk on the left leg"; states has a walker.  She also has questions about her brace - wants to make sure it is not too tight.  Please advise.  PH# [phone].

## 2015-01-02 NOTE — Telephone Encounter (Signed)
Routing to Dr Harrison 

## 2015-01-03 ENCOUNTER — Ambulatory Visit (INDEPENDENT_AMBULATORY_CARE_PROVIDER_SITE_OTHER): Payer: Medicaid Other | Admitting: Orthopedic Surgery

## 2015-01-03 VITALS — BP 119/79 | Ht 66.0 in | Wt 123.0 lb

## 2015-01-03 DIAGNOSIS — S82002S Unspecified fracture of left patella, sequela: Secondary | ICD-10-CM

## 2015-01-03 NOTE — Progress Notes (Signed)
Patient ID: [name], female   DOB: [birth year], 49 y.o.   MRN: [id number]  Follow up visit  Chief Complaint  Patient presents with  . Follow-up    Recheck left knee, DOS 12-29-14.    BP 119/79 mmHg  Ht 5\' 6"  (1.676 m)  Wt 123 lb (55.792 kg)  BMI 19.86 kg/m2  Encounter Diagnosis  Name Primary?  . Patella fracture, left, sequela Yes    Post op follow-up patient concerned about swelling  Status post patella fracture open treatment internal fixation tension band technique patient in the brace ambulatory weight-bear as tolerated in the brace  Wound looks fine she does have some swelling but she's been walking on this a lot  I told her to decrease the ambulation use more ice and change the dressing she'll follow-up on the 20th for xray

## 2015-01-03 NOTE — Telephone Encounter (Signed)
Patient is scheduled for 10:45 this morning

## 2015-01-03 NOTE — Telephone Encounter (Signed)
Routing to Darius Bump for scheduling

## 2015-01-08 ENCOUNTER — Telehealth: Payer: Self-pay | Admitting: Orthopedic Surgery

## 2015-01-08 ENCOUNTER — Other Ambulatory Visit: Payer: Self-pay | Admitting: *Deleted

## 2015-01-08 MED ORDER — HYDROMORPHONE HCL 2 MG PO TABS: 2.0000 mg | ORAL_TABLET | ORAL | Status: DC | PRN

## 2015-01-08 NOTE — Telephone Encounter (Signed)
Prescription available, patient aware  

## 2015-01-08 NOTE — Telephone Encounter (Signed)
Patient is calling requesting a medication refill on HYDROmorphone (DILAUDID) 2 MG tablet  She has an appointment to see Dr. Aline Brochure on Wednesday July 20th , please advise?

## 2015-01-10 ENCOUNTER — Ambulatory Visit (INDEPENDENT_AMBULATORY_CARE_PROVIDER_SITE_OTHER): Payer: Self-pay | Admitting: Orthopedic Surgery

## 2015-01-10 ENCOUNTER — Ambulatory Visit (INDEPENDENT_AMBULATORY_CARE_PROVIDER_SITE_OTHER): Payer: Medicaid Other

## 2015-01-10 VITALS — BP 132/80 | Ht 66.0 in | Wt 123.0 lb

## 2015-01-10 DIAGNOSIS — S82002D Unspecified fracture of left patella, subsequent encounter for closed fracture with routine healing: Secondary | ICD-10-CM | POA: Diagnosis not present

## 2015-01-10 DIAGNOSIS — S82002A Unspecified fracture of left patella, initial encounter for closed fracture: Secondary | ICD-10-CM | POA: Insufficient documentation

## 2015-01-10 MED ORDER — HYDROMORPHONE HCL 2 MG PO TABS: 2.0000 mg | ORAL_TABLET | ORAL | Status: DC | PRN

## 2015-01-10 NOTE — Progress Notes (Signed)
Patient ID: [name], female   DOB: September 24, 1965, 49 y.o.   MRN: [id number]  Follow up visit  Chief Complaint  Patient presents with  . Follow-up    POST OP, LEFT PATELLA FX, ORIF, DOS 12/29/14    BP 132/80 mmHg  Ht 5\' 6"  (1.676 m)  Wt 123 lb (55.792 kg)  BMI 19.86 kg/m2  Encounter Diagnosis  Name Primary?  . Left patella fracture, closed, with routine healing, subsequent encounter Yes    12 days postop left patella fracture tension band ORIF doing well wounds look fine x-rays look good continue bracing for 4 weeks and then placed and a T score brace of next x-ray looks fine  Continue hydromorphone 2 mg

## 2015-02-03 ENCOUNTER — Other Ambulatory Visit: Payer: Self-pay | Admitting: Orthopedic Surgery

## 2015-02-07 ENCOUNTER — Ambulatory Visit: Payer: Medicaid Other | Admitting: Orthopedic Surgery

## 2015-02-08 ENCOUNTER — Other Ambulatory Visit: Payer: Self-pay | Admitting: Orthopedic Surgery

## 2015-02-08 ENCOUNTER — Ambulatory Visit (HOSPITAL_COMMUNITY)
Admission: RE | Admit: 2015-02-08 | Discharge: 2015-02-08 | Disposition: A | Discharge status: Home / Self Care | Payer: Medicaid Other | Source: Ambulatory Visit | Attending: Orthopedic Surgery | Admitting: Orthopedic Surgery

## 2015-02-08 ENCOUNTER — Ambulatory Visit (INDEPENDENT_AMBULATORY_CARE_PROVIDER_SITE_OTHER): Payer: Self-pay | Admitting: Orthopedic Surgery

## 2015-02-08 VITALS — BP 132/87 | Ht 66.0 in | Wt 123.0 lb

## 2015-02-08 DIAGNOSIS — S82042D Displaced comminuted fracture of left patella, subsequent encounter for closed fracture with routine healing: Secondary | ICD-10-CM | POA: Diagnosis not present

## 2015-02-08 DIAGNOSIS — X58XXXD Exposure to other specified factors, subsequent encounter: Secondary | ICD-10-CM | POA: Diagnosis not present

## 2015-02-08 DIAGNOSIS — S82002D Unspecified fracture of left patella, subsequent encounter for closed fracture with routine healing: Secondary | ICD-10-CM

## 2015-02-08 MED ORDER — HYDROMORPHONE HCL 2 MG PO TABS: 2.0000 mg | ORAL_TABLET | Freq: Four times a day (QID) | ORAL | Status: DC | PRN

## 2015-02-08 NOTE — Patient Instructions (Addendum)
Start physical therapy [facility]

## 2015-02-08 NOTE — Progress Notes (Signed)
Chief Complaint  Patient presents with  . Follow-up    4 week recheck on left patella fracture, DOS 12-29-14    Patient presents after patella fracture this is week #6 postop x-rays show fracture is in stable position with tension band wiring  Knee looks good she has some prominent hardware. She is a thin lady. We tried to place her in a T score brace it didn't fit we placed her in a economy hinged brace.  Patient will continue to be weightbearing as tolerated  Her current range of motion is 0-80 passively  We will need to start some physical therapy on her.  Follow-up 6 weeks repeat x-ray

## 2015-02-09 ENCOUNTER — Telehealth: Payer: Self-pay | Admitting: Orthopedic Surgery

## 2015-02-09 NOTE — Telephone Encounter (Signed)
Patient is stating that the new leg brace that was put her yesterday is making her hurt worse, she is asking for the old leg brace back, she stated that she couldn't even sleep because she was hurting so bad, please advise?

## 2015-02-09 NOTE — Telephone Encounter (Signed)
Routing to Dr Harrison 

## 2015-02-12 NOTE — Telephone Encounter (Signed)
Per dr Aline Brochure, we will give patient knee immobilizer, patient aware

## 2015-02-12 NOTE — Telephone Encounter (Signed)
Ok give her the other one back

## 2015-02-22 ENCOUNTER — Ambulatory Visit: Payer: Medicaid Other | Attending: Orthopedic Surgery | Admitting: Physical Therapy

## 2015-02-22 DIAGNOSIS — M25562 Pain in left knee: Secondary | ICD-10-CM | POA: Diagnosis present

## 2015-02-22 DIAGNOSIS — M25662 Stiffness of left knee, not elsewhere classified: Secondary | ICD-10-CM | POA: Diagnosis present

## 2015-02-22 NOTE — Patient Instructions (Signed)
Advanced Straight Leg Raise   Tighten left leg muscles and raise left leg up in air about 16 inches-do 15 reps-----do 2 to 3 sets do 3-4 times a day.  http://orth.exer.us/1109   Copyright  VHI. All rights reserved.  Quad Set   With other leg bent, foot flat, slowly tighten muscles on thigh of straight leg while counting out loud to __10__. Repeat with other leg. Repeat __10__ times. Do 3 to  4Bracing With Heel Slides (Supine)   Slide left  heel to bottom hold 20 seconds  Repeat _10__ times. Do _3 to 4

## 2015-02-22 NOTE — Therapy (Signed)
[facility] [place], [place], [address] Phone: [phone]   Fax:  [phone]  Physical Therapy Evaluation  Patient Details  Name: [name] MRN: [id number] Date of Birth: 30-Oct-1965 Referring Provider:  Carole Civil, MD  Encounter Date: 02/22/2015      PT End of Session - 02/22/15 1426    Visit Number 1   Number of Visits 12   Date for PT Re-Evaluation 04/12/15   PT Start Time 0156   PT Stop Time 0226   PT Time Calculation (min) 30 min   Activity Tolerance Patient tolerated treatment well   Behavior During Therapy [facility] for tasks assessed/performed      Past Medical History  Diagnosis Date  . Hyperlipidemia   . Hypertension   . Anemia     Past Surgical History  Procedure Laterality Date  . Abdominal hysterectomy      Partial  . Orif patella Left 12/29/2014    Procedure: OPEN REDUCTION INTERNAL FIXATION LEFT PATELLA;  Surgeon: Carole Civil, MD;  Location: [facility];  Service: Orthopedics;  Laterality: Left;    There were no vitals filed for this visit.  Visit Diagnosis:  Left knee pain - Plan: PT plan of care cert/re-cert  Knee stiffness, left - Plan: PT plan of care cert/re-cert      Subjective Assessment - 02/22/15 1425    Subjective Beem doing exercises at home.   Limitations Standing   How long can you stand comfortably? 10 minutes.   Patient Stated Goals Get nromal use of left knee again.   Currently in Pain? Yes   Pain Score 6    Pain Location Knee   Pain Orientation Left   Pain Descriptors / Indicators Aching   Pain Onset 1 to 4 weeks ago   Pain Frequency Constant   Aggravating Factors  Increased standing.   Pain Relieving Factors Rest.            [facility] PT Assessment - 02/22/15 0001    Assessment   Medical Diagnosis Left patellar fracture.   Onset Date/Surgical Date --  12/28/14.   Precautions   Precautions --  Pt using FWW and knee brace (left) with patellar orifice.    Balance Screen   Has the patient fallen in the past 6 months Yes   How many times? 1   Has the patient had a decrease in activity level because of a fear of falling?  No   Is the patient reluctant to leave their home because of a fear of falling?  No   Home Environment   Living Environment Private residence   Prior Function   Level of Independence Independent   Observation/Other Assessments-Edema    Edema Circumferential   Circumferential Edema   Circumferential - Left  Left mid-patellar 1.5 cms > right.   ROM / Strength   AROM / PROM / Strength AROM;Strength   AROM   Overall AROM Comments Full left knee extension in supine and flexion to 92 degrees.   Strength   Overall Strength Comments Significant left quadriceps atropht and decreased volitional contraction of this musculature.     Palpation   Palpation comment Mild tenderness around the patient's left patella.   Ambulation/Gait   Gait Comments Antalgic gait with a FWW and patient using a left knee brace with patellar orifice.patellar  PT Long Term Goals - 02/22/15 1433    PT LONG TERM GOAL #1   Title Ind with an HEP.   Baseline Patient requires instruction for advancement of appropriate ther ex.   Time 6   Period Weeks   Status New   PT LONG TERM GOAL #2   Title Active left knee flexion= 120 degres.   Baseline 92 degrees.   Time 6   Period Weeks   Status New   PT LONG TERM GOAL #3   Title 5/5 left knee strength.   Baseline Significant left quadriceps atrophy and decreased volitional contraction of this musculature.   Time 6   Period Weeks   Status New   PT LONG TERM GOAL #4   Title Walk a community distance without assistive device and no antalgia.   Baseline Significant gait antalgia with a FWW.   Time 6   Period Weeks   Status New               Plan - 02/22/15 1427    Clinical Impression Statement The patient slipped and fell coming out of  [employer] on 12/28/14.  Her left knee went backward as she fell.   The patient sustained a left patellar fracture and subsquently underwent an ORIF on 12/29/14.  She presents to the clinic with a FWW and a left knee brace with a patellar orifice.  Her current pain-level is a 6/10 but can rise to 7+/10 with increased standing.  She has been doing some home exercises.  Instructed her not to sleep with a pillow under her left knee keeping it flexion.   Pt will benefit from skilled therapeutic intervention in order to improve on the following deficits Abnormal gait;Decreased activity tolerance;Decreased range of motion;Pain;Decreased strength   PT Frequency 2x / week   PT Duration 6 weeks   PT Treatment/Interventions ADLs/Self Care Home Management;Electrical Stimulation;Gait training;Therapeutic activities;Therapeutic exercise;Neuromuscular re-education;Patient/family education;Passive range of motion;Vasopneumatic Device   PT Next Visit Plan VMS to right quadriceps; left knee range of motion and low-level strengthening (non-resisted).  Nustep.  Vasopneumatic.   Consulted and Agree with Plan of Care Patient         Problem List Patient Active Problem List   Diagnosis Date Noted  . Left patella fracture 01/10/2015  . Patella fracture 12/28/2014  . GAD (generalized anxiety disorder) 03/06/2014  . Hyperlipidemia 03/06/2014  . Essential hypertension, benign 03/06/2014    Murat Rideout, [place] MPT 02/22/2015, 2:43 PM  [facility] [address] [place], [place], [address] Phone: [phone]   Fax:  [phone]

## 2015-03-08 ENCOUNTER — Other Ambulatory Visit: Payer: Self-pay | Admitting: Family

## 2015-03-14 ENCOUNTER — Encounter: Payer: Self-pay | Admitting: Physical Therapy

## 2015-03-14 ENCOUNTER — Ambulatory Visit: Payer: Medicaid Other | Admitting: Physical Therapy

## 2015-03-14 DIAGNOSIS — M25562 Pain in left knee: Secondary | ICD-10-CM | POA: Diagnosis not present

## 2015-03-14 DIAGNOSIS — M25662 Stiffness of left knee, not elsewhere classified: Secondary | ICD-10-CM

## 2015-03-14 NOTE — Patient Instructions (Signed)
Strengthening: Straight Leg Raise (Phase 1)   Keep your left knee straight as possible and keep L toes pointed to ceiling. Keep muscle in left thigh tight and lift off the table slowly. Complete 20 times. Do 3 times a day.  http://orth.exer.us/614   Copyright  VHI. All rights reserved.  Self-Mobilization: Heel Slide (Supine)   Slide left heel toward butt until a gentle stretch in the front of your thigh is felt. Hold __3__ seconds. Relax. Complete 20 times. Do 3 times a day.  http://orth.exer.us/710   Copyright  VHI. All rights reserved.  Strengthening: Quadriceps Set   Press your left knee down into the bed and try to keep it as straight as possible. Hold __5__ seconds. Complete 20 times. Do 3 times a day if possible.  http://orth.exer.us/602   Copyright  VHI. All rights reserved.   **DO NOT KEEP PILLOWS UNDER YOUR LEFT KNEE AT HOME. KEEP PILLOWS UNDER LEFT ANKLE. YOU DO NOT WANT YOUR LEFT KNEE BEING STUCK.**

## 2015-03-14 NOTE — Therapy (Signed)
[facility] [place], [place], [address] Phone: [phone]   Fax:  [phone]  Physical Therapy Treatment  Patient Details  Name: [name] MRN: [id number] Date of Birth: [birth year] Referring Provider:  Sharion Balloon, FNP  Encounter Date: 03/14/2015      PT End of Session - 03/14/15 1401    Visit Number 2   Number of Visits 12   Date for PT Re-Evaluation 04/12/15   PT Start Time 1351   PT Stop Time 1449   PT Time Calculation (min) 58 min   Activity Tolerance Patient tolerated treatment well   Behavior During Therapy [facility] for tasks assessed/performed      Past Medical History  Diagnosis Date  . Hyperlipidemia   . Hypertension   . Anemia     Past Surgical History  Procedure Laterality Date  . Abdominal hysterectomy      Partial  . Orif patella Left 12/29/2014    Procedure: OPEN REDUCTION INTERNAL FIXATION LEFT PATELLA;  Surgeon: Carole Civil, MD;  Location: [facility];  Service: Orthopedics;  Laterality: Left;    There were no vitals filed for this visit.  Visit Diagnosis:  Left knee pain  Knee stiffness, left      Subjective Assessment - 03/14/15 1400    Subjective Reports being up on her feet a lot today. Reports completing HEP given at evaluation when she has time but that is not consistently.   Limitations Standing   How long can you stand comfortably? 10 minutes.   Patient Stated Goals Get nromal use of left knee again.   Currently in Pain? Yes   Pain Score 4    Pain Location Knee   Pain Orientation Left   Pain Descriptors / Indicators Sharp;Sore   Pain Onset 1 to 4 weeks ago            [facility] PT Assessment - 03/14/15 0001    Assessment   Medical Diagnosis Left patellar fracture.   Onset Date/Surgical Date 12/28/14   Next MD Visit 03/22/2015                     [facility] Adult PT Treatment/Exercise - 03/14/15 0001    Exercises   Exercises Knee/Hip   Knee/Hip  Exercises: Aerobic   Nustep L4 x12 min   Knee/Hip Exercises: Standing   Rocker Board 3 minutes   Knee/Hip Exercises: Supine   Quad Sets Strengthening;Left;2 sets;10 reps;Other (comment)  5 sec hold   Short Arc [employer] Strengthening;Left;2 sets;10 reps;Other (comment)  5 sec hols   Heel Slides AROM;Left;2 sets;10 reps   Straight Leg Raises Strengthening;Left;2 sets;10 reps   Modalities   Modalities Designer, multimedia Location L knee   Electrical Stimulation Action IFC   Electrical Stimulation Parameters 1-10 Hz x15 min   Electrical Stimulation Goals Pain   Vasopneumatic   Number Minutes Vasopneumatic  15 minutes   Vasopnuematic Location  Knee   Vasopneumatic Pressure Medium   Vasopneumatic Temperature  34                PT Education - 03/14/15 1523    Education provided Yes   Education Details HEP- heel slides, QS, SLR; educated to not rest with pillows under L knee only under L ankle to prevent L knee flexion contracture.   Person(s) Educated Patient   Methods Explanation;Demonstration;Tactile cues;Verbal cues;Handout   Comprehension Verbalized understanding;Returned demonstration;Verbal cues required;Tactile  cues required             PT Long Term Goals - 02/22/15 1433    PT LONG TERM GOAL #1   Title Ind with an HEP.   Baseline Patient requires instruction for advancement of appropriate ther ex.   Time 6   Period Weeks   Status New   PT LONG TERM GOAL #2   Title Active left knee flexion= 120 degres.   Baseline 92 degrees.   Time 6   Period Weeks   Status New   PT LONG TERM GOAL #3   Title 5/5 left knee strength.   Baseline Significant left quadriceps atrophy and decreased volitional contraction of this musculature.   Time 6   Period Weeks   Status New   PT LONG TERM GOAL #4   Title Walk a community distance without assistive device and no antalgia.   Baseline Significant gait  antalgia with a FWW.   Time 6   Period Weeks   Status New               Plan - 03/14/15 1524    Clinical Impression Statement Patient tolerated treatment well today and completed all exercises with max multimodal cueing for correct technique and form. Was given new HEP that is rewritten in a different way that is easier for patient to comprehend and extra time was taken to go over the HEP and to answer any questions. Reviewed previous exercises given at evaluation and exercises may be the same although they are phrased differently for patient's benefit. Educated patient regarding the correct form of the exercises as well as to not rest at home with pillows under L knee to prevent L knee flexion contracture and to rest with pillows under L ankle. Accepted new HEP without questions and was given card with facility's phone number for patient to call if she has any questions. Normal modalities response noted following removal of the modaliites. Denied pain following today's treatment.   Pt will benefit from skilled therapeutic intervention in order to improve on the following deficits Abnormal gait;Decreased activity tolerance;Decreased range of motion;Pain;Decreased strength   PT Frequency 2x / week   PT Duration 6 weeks   PT Treatment/Interventions ADLs/Self Care Home Management;Electrical Stimulation;Gait training;Therapeutic activities;Therapeutic exercise;Neuromuscular re-education;Patient/family education;Passive range of motion;Vasopneumatic Device   PT Next Visit Plan VMS to right quadriceps; left knee range of motion and low-level strengthening (non-resisted).  Nustep.  Vasopneumatic.   Consulted and Agree with Plan of Care Patient        Problem List Patient Active Problem List   Diagnosis Date Noted  . Left patella fracture 01/10/2015  . Patella fracture 12/28/2014  . GAD (generalized anxiety disorder) 03/06/2014  . Hyperlipidemia 03/06/2014  . Essential hypertension, benign  03/06/2014    Wynelle Fanny, PTA 03/14/2015, 3:48 PM  [facility] [address] [place], [place], [address] Phone: [phone]   Fax:  [phone]

## 2015-03-20 ENCOUNTER — Ambulatory Visit: Payer: Medicaid Other | Admitting: Physical Therapy

## 2015-03-20 ENCOUNTER — Encounter: Payer: Self-pay | Admitting: Physical Therapy

## 2015-03-20 DIAGNOSIS — M25562 Pain in left knee: Secondary | ICD-10-CM

## 2015-03-20 DIAGNOSIS — M25662 Stiffness of left knee, not elsewhere classified: Secondary | ICD-10-CM

## 2015-03-20 NOTE — Patient Instructions (Signed)
Strengthening: Straight Leg Raise (Phase 1)   Keep left knee straight and left toes pointed straight up to the ceiling. Raise left leg off of table or couch. Repeat _10___ times per set. Do _3___ sets per session. Do _2-3___ sessions per day.  http://orth.exer.us/614   Copyright  VHI. All rights reserved.  Straight Leg Raise: With External Leg Rotation   Keep left toes pointed out to the side and keep left knee straight. Raise left leg straight up off table or couch. Repeat __10__ times per set. Do __3__ sets per session. Do __2-3__ sessions per day.  http://orth.exer.us/728   Copyright  VHI. All rights reserved.  Strengthening: Hip Abduction (Side-Lying)   Lay on your right side and keep left knee straight and left toes point straight in front of you. Raise left leg straight up towards the ceiling.  Repeat __10__ times per set. Do _3___ sets per session. Do __2-3__ sessions per day.  http://orth.exer.us/622   Copyright  VHI. All rights reserved.  Strengthening: Hip Adduction (Side-Lying)   Lay on your left side and bend your right leg up and place it in front of or behind your left leg. Keep left knee straight and toes pointed straight in front of you and raise left leg up off table or couch. Repeat _10___ times per set. Do __3__ sets per session. Do _2-3___ sessions per day.  http://orth.exer.us/624   Copyright  VHI. All rights reserved.  Strengthening: Hip Extension (Prone)   Lay on your stomach and keep left knee straight as you raise it up towards the ceiling. Repeat _10___ times per set. Do __3__ sets per session. Do _2-3___ sessions per day.  http://orth.exer.us/620   Copyright  VHI. All rights reserved.  Bridging   Bend both knees and raise your butt up off the table or couch. Repeat __10__ times per set. Do __3__ sets per session. Do __2-3__ sessions per day.  http://orth.exer.us/1096   Copyright  VHI. All rights reserved.   Lay with pillow under  left knee and straighten left knee while keeping back of left knee on the pillow. Hold left knee straight for 5 seconds. Complete 30 repititions 2-3 times per day.

## 2015-03-20 NOTE — Therapy (Signed)
[facility] [place], [place], [address] Phone: [phone]   Fax:  [phone]  Physical Therapy Treatment  Patient Details  Name: [name] MRN: [id number] Date of Birth: 04-Mar-1966 Referring Provider:  Sharion Balloon, FNP  Encounter Date: 03/20/2015      PT End of Session - 03/20/15 0916    Visit Number 3   Number of Visits 12   Date for PT Re-Evaluation 04/12/15   PT Start Time 0903   PT Stop Time 0947   PT Time Calculation (min) 44 min   Activity Tolerance Patient tolerated treatment well   Behavior During Therapy [facility] for tasks assessed/performed      Past Medical History  Diagnosis Date  . Hyperlipidemia   . Hypertension   . Anemia     Past Surgical History  Procedure Laterality Date  . Abdominal hysterectomy      Partial  . Orif patella Left 12/29/2014    Procedure: OPEN REDUCTION INTERNAL FIXATION LEFT PATELLA;  Surgeon: Carole Civil, MD;  Location: [facility];  Service: Orthopedics;  Laterality: Left;    There were no vitals filed for this visit.  Visit Diagnosis:  Left knee pain  Knee stiffness, left      Subjective Assessment - 03/20/15 0912    Subjective Reports having increased pain Sunday while resting. Reports completing HEP more often as directed. Reports being able to get around some at home without the walker.   Limitations Standing   How long can you stand comfortably? 10 minutes.   Patient Stated Goals Get nromal use of left knee again.   Currently in Pain? Yes   Pain Score 7    Pain Location Knee   Pain Orientation Left   Pain Descriptors / Indicators Aching;Sharp   Pain Onset 1 to 4 weeks ago            [facility] PT Assessment - 03/20/15 0001    Assessment   Medical Diagnosis Left patellar fracture.   Onset Date/Surgical Date 12/28/14   Next MD Visit 03/22/2015   ROM / Strength   AROM / PROM / Strength AROM   AROM   Overall AROM  Deficits   AROM Assessment Site  Knee   Right/Left Knee Left   Left Knee Flexion 106                     [facility] Adult PT Treatment/Exercise - 03/20/15 0001    Knee/Hip Exercises: Aerobic   Nustep L5 x12 min   Knee/Hip Exercises: Standing   Rocker Board 3 minutes   Knee/Hip Exercises: Supine   Short Arc Quad Sets Strengthening;Left;3 sets;10 reps   Bridges Limitations x30 reps   Straight Leg Raises Strengthening;Left;3 sets;10 reps   Straight Leg Raise with External Rotation Strengthening;Left;3 sets;10 reps   Knee/Hip Exercises: Sidelying   Hip ABduction Strengthening;Left;3 sets;10 reps   Hip ADduction Strengthening;Left;3 sets;10 reps   Knee/Hip Exercises: Prone   Hip Extension Strengthening;Left;3 sets;10 reps                PT Education - 03/20/15 1217    Education provided Yes   Education Details HEP- SLR, SLR with ER, Hip abduction, Hip adduction, Hip extension, SAQ   Person(s) Educated Patient   Methods Explanation;Demonstration;Tactile cues;Verbal cues;Handout   Comprehension Verbalized understanding;Returned demonstration;Verbal cues required;Tactile cues required             PT Long Term Goals - 03/20/15 7121  PT LONG TERM GOAL #1   Title Ind with an HEP.   Baseline Patient requires instruction for advancement of appropriate ther ex.   Time 6   Period Weeks   Status Achieved   PT LONG TERM GOAL #2   Title Active left knee flexion= 120 degres.   Baseline 92 degrees.   Time 6   Period Weeks   Status On-going   PT LONG TERM GOAL #3   Title 5/5 left knee strength.   Baseline Significant left quadriceps atrophy and decreased volitional contraction of this musculature.   Time 6   Period Weeks   Status On-going   PT LONG TERM GOAL #4   Title Walk a community distance without assistive device and no antalgia.   Baseline Significant gait antalgia with a FWW.   Time 6   Period Weeks   Status On-going               Plan - 03/20/15 1203    Clinical  Impression Statement Patient tolerated treatment very well and patient's strength improvement was noted today in LLE but continues to present with L thigh atrophy. Continues to present in therapy gym with FWW and L knee brace as noted in evaluation but reports being able to get around some without the walker. Patient requires max multimodal cueing for correct technique and form. Was given new strengthening HEP was given to patient was max multimodal cueing for patient's understanding and patient did not relay any questions at that time. Patient did not demonstrate as much LLE weakness today during exercises and has reported HEP compliance. AROM of L knee measured today as 106 deg of flexion. Patient denied modaliites during today's apointment. Reported experiencing feeling better following today's treatment and 2-3/10 pain level.    Pt will benefit from skilled therapeutic intervention in order to improve on the following deficits Abnormal gait;Decreased activity tolerance;Decreased range of motion;Pain;Decreased strength   PT Frequency 2x / week   PT Duration 6 weeks   PT Treatment/Interventions ADLs/Self Care Home Management;Electrical Stimulation;Gait training;Therapeutic activities;Therapeutic exercise;Neuromuscular re-education;Patient/family education;Passive range of motion;Vasopneumatic Device   PT Next Visit Plan VMS to right quadriceps; left knee range of motion and low-level strengthening (non-resisted).  Nustep.  Vasopneumatic.   Consulted and Agree with Plan of Care Patient        Problem List Patient Active Problem List   Diagnosis Date Noted  . Left patella fracture 01/10/2015  . Patella fracture 12/28/2014  . GAD (generalized anxiety disorder) 03/06/2014  . Hyperlipidemia 03/06/2014  . Essential hypertension, benign 03/06/2014   Mali Applegate MPT Ahmed Prima, [place] 03/20/2015 12:48 PM  [facility] [address] [place],  [place], [address] Phone: [phone]   Fax:  [phone]

## 2015-03-22 ENCOUNTER — Ambulatory Visit (INDEPENDENT_AMBULATORY_CARE_PROVIDER_SITE_OTHER): Payer: Medicaid Other

## 2015-03-22 ENCOUNTER — Ambulatory Visit (INDEPENDENT_AMBULATORY_CARE_PROVIDER_SITE_OTHER): Payer: Self-pay | Admitting: Orthopedic Surgery

## 2015-03-22 VITALS — BP 129/94 | Ht 66.0 in | Wt 123.0 lb

## 2015-03-22 DIAGNOSIS — S82002D Unspecified fracture of left patella, subsequent encounter for closed fracture with routine healing: Secondary | ICD-10-CM | POA: Diagnosis not present

## 2015-03-22 MED ORDER — HYDROMORPHONE HCL 2 MG PO TABS: 2.0000 mg | ORAL_TABLET | Freq: Four times a day (QID) | ORAL | Status: DC | PRN

## 2015-03-22 MED ORDER — IBUPROFEN 800 MG PO TABS: 800.0000 mg | ORAL_TABLET | Freq: Three times a day (TID) | ORAL | Status: DC | PRN

## 2015-03-22 NOTE — Progress Notes (Signed)
Patient ID: [name], female   DOB: 09-07-1965, 49 y.o.   MRN: [id number]  Follow up visit  Chief Complaint  Patient presents with  . Follow-up    6 week follow up left knee patella fracture DOS 12/29/14    BP 129/94 mmHg  Ht 5\' 6"  (1.676 m)  Wt 123 lb (55.792 kg)  BMI 19.86 kg/m2  Encounter Diagnosis  Name Primary?  . Patella fracture, left, closed, with routine healing, subsequent encounter Yes    3 month x-rays today patella fracture open treatment internal fixation 10 Jamal wire patient complains of prominence of hardware  X-ray show fracture is stable  Exam knee flexion is 105 knee extension is 5 straight leg raise shows 5 extensor lag  Patient hydromorphone stopped continue tramadol come back 2 months probably have to take hardware at some point.

## 2015-03-27 ENCOUNTER — Ambulatory Visit: Payer: Medicaid Other | Attending: Orthopedic Surgery | Admitting: Physical Therapy

## 2015-03-27 ENCOUNTER — Encounter: Payer: Self-pay | Admitting: Physical Therapy

## 2015-03-27 DIAGNOSIS — M25662 Stiffness of left knee, not elsewhere classified: Secondary | ICD-10-CM | POA: Diagnosis present

## 2015-03-27 DIAGNOSIS — M25562 Pain in left knee: Secondary | ICD-10-CM | POA: Diagnosis present

## 2015-03-27 NOTE — Therapy (Signed)
[facility] [place], [place], [address] Phone: [phone]   Fax:  [phone]  Physical Therapy Treatment  Patient Details  Name: [name] MRN: [id number] Date of Birth: 23-Aug-1965 Referring Provider:  Sharion Balloon, FNP  Encounter Date: 03/27/2015      PT End of Session - 03/27/15 1211    Visit Number 4   Number of Visits 12   Date for PT Re-Evaluation 04/12/15   PT Start Time 1116   PT Stop Time 1208   PT Time Calculation (min) 52 min   Activity Tolerance Patient tolerated treatment well   Behavior During Therapy [facility] for tasks assessed/performed      Past Medical History  Diagnosis Date  . Hyperlipidemia   . Hypertension   . Anemia     Past Surgical History  Procedure Laterality Date  . Abdominal hysterectomy      Partial  . Orif patella Left 12/29/2014    Procedure: OPEN REDUCTION INTERNAL FIXATION LEFT PATELLA;  Surgeon: Carole Civil, MD;  Location: [facility];  Service: Orthopedics;  Laterality: Left;    There were no vitals filed for this visit.  Visit Diagnosis:  Left knee pain  Knee stiffness, left      Subjective Assessment - 03/27/15 1122    Subjective Reports that MD said she was doing good and to come back in 2 months and will have pins removed in January. Reports more pain at night than in the day.   Limitations Standing   How long can you stand comfortably? 10 minutes.   Patient Stated Goals Get nromal use of left knee again.   Currently in Pain? Yes   Pain Score 3    Pain Location Knee   Pain Orientation Left            [facility] PT Assessment - 03/27/15 0001    Assessment   Medical Diagnosis Left patellar fracture.   Onset Date/Surgical Date 12/28/14   Next MD Visit 04/2015   ROM / Strength   AROM / PROM / Strength AROM;Strength   AROM   Overall AROM  Deficits   AROM Assessment Site Knee   Right/Left Knee Left   Left Knee Flexion 117   Strength   Overall  Strength Deficits   Strength Assessment Site Knee   Right/Left Knee Left   Left Knee Flexion 4/5   Left Knee Extension 4/5                     [facility] Adult PT Treatment/Exercise - 03/27/15 0001    Ambulation/Gait   Ambulation/Gait Yes   Ambulation/Gait Assistance 6: Modified independent (Device/Increase time)   Ambulation Distance (Feet) 80 Feet   Assistive device Small based quad cane   Gait Pattern Step-to pattern;Step-through pattern;Decreased arm swing - left;Decreased step length - left;Narrow base of support  Slight antalgic gait   Ambulation Surface Level;Indoor   Gait Comments Educated to use father's spare cane now at home; use safe footwear such as tennis shoes. Educated to rest and take her time while out in community until strength and endurance returns in LLE   Knee/Hip Exercises: Aerobic   Nustep L5 x12 min   Knee/Hip Exercises: Standing   Forward Step Up Left;3 sets;10 reps;Hand Hold: 2;Step Height: 6"   Knee/Hip Exercises: Seated   Long Arc Quad Strengthening;Left;3 sets;10 reps;Weights   Long Arc Quad Weight 3 lbs.   Knee/Hip Exercises: Supine   Bridges Limitations x20  reps   Knee/Hip Exercises: Sidelying   Hip ABduction Strengthening;Left;3 sets;10 reps                PT Education - 03/27/15 1209    Education provided Yes   Education Details HEP- forward step ups; educated on safe footwear, use of father's extra cane, taking time out in public while shopping, completing HEP   Person(s) Educated Patient   Methods Explanation;Verbal cues;Handout   Comprehension Verbalized understanding;Verbal cues required             PT Long Term Goals - 03/27/15 1152    PT LONG TERM GOAL #1   Title Ind with an HEP.   Baseline Patient requires instruction for advancement of appropriate ther ex.   Time 6   Period Weeks   Status Achieved   PT LONG TERM GOAL #2   Title Active left knee flexion= 120 degres.   Baseline 92 degrees.   Time 6    Period Weeks   Status Not Met  AROM L knee 117 deg 03/27/2015   PT LONG TERM GOAL #3   Title 5/5 left knee strength.   Baseline Significant left quadriceps atrophy and decreased volitional contraction of this musculature.   Time 6   Period Weeks   Status Not Met  L knee ext 4/5, flex 4/5   PT LONG TERM GOAL #4   Title Walk a community distance without assistive device and no antalgia.   Baseline Significant gait antalgia with a FWW.   Time 6   Period Weeks   Status Not Met               Plan - 03/27/15 1213    Clinical Impression Statement Patient tolerated treatment very well today and continues to have strength deficits but strength has improved from beginning of PT. Presented in therapy gym with L knee brace and FWW today. Was educated regarding 3pt gait pattern with [facility] but educated patient to use father's spare cane that he has at home. Patient continues to require max multimodal cueing for correct technique of new exercises and for explanation of HEP handout. Verbalized understanding of final HEP given today following max verbal cueing for explanation. Achieved all LT goals set at evaluation except for L knee flexion goal, L knee strength goal, ambulation without AD goal. Patient denied any L knee pain following today's treatment.   Pt will benefit from skilled therapeutic intervention in order to improve on the following deficits Abnormal gait;Decreased activity tolerance;Decreased range of motion;Pain;Decreased strength   PT Frequency 2x / week   PT Duration 6 weeks   PT Treatment/Interventions ADLs/Self Care Home Management;Electrical Stimulation;Gait training;Therapeutic activities;Therapeutic exercise;Neuromuscular re-education;Patient/family education;Passive range of motion;Vasopneumatic Device   PT Next Visit Plan Communicate need for D/C summary to MPT.   Consulted and Agree with Plan of Care Patient        Problem List Patient Active Problem List   Diagnosis  Date Noted  . Left patella fracture 01/10/2015  . Patella fracture 12/28/2014  . GAD (generalized anxiety disorder) 03/06/2014  . Hyperlipidemia 03/06/2014  . Essential hypertension, benign 03/06/2014    Ahmed Prima, PTA 03/27/2015 12:19 PM  [facility] [address] [place], [place], [address] Phone: [phone]   Fax:  [phone]

## 2015-03-27 NOTE — Patient Instructions (Signed)
  Step-Down / Step-Up    Stand at the bottom of steps and bring left foot onto step and then bring right foot onto same step. Then bring right foot back down to ground and bring left foot down to ground. Complete 30 times, once or twice a day.  http://orth.exer.us/684   Copyright  VHI. All rights reserved.   **USE FATHER'S SPARE CANE TO WALK AROUND AT HOME OR OUT IN THE COMMUNITY IN RIGHT HAND. MOVE CANE FORWARD, MOVE LEFT LEG FORWARD, BRING RIGHT LEG FORWARD  **USE LEFT KNEE BRACE UNTIL YOU FEEL YOU ARE STRONG ENOUGH TO BE SAFE WITHOUT IT.  **WEAR TENNIS SHOES WHILE OUT IN PUBLIC OR IN THE COMMUNITY TO BE SAFE. BE CAREFUL WITH FLIP FLOPS AND BEDROOM SHOES BECAUSE YOU MAY FALL OR SLIP.  **TAKE YOUR TIME AND REST IF YOU NEED TO WHILE AT [facility] OR OUT SHOPPING UNTIL YOUR STRENGTH RETURNS.   **CONTINUE ALL EXERCISES YOU HAVE BEEN GIVEN IN PHYSICAL THERAPY UNTIL DR. HARRISON TELLS YOU THAT YOU CAN STOP THE EXERCISES.

## 2015-03-28 NOTE — Therapy (Signed)
[facility] [place], [place], [address] Phone: [phone]   Fax:  [phone]  Physical Therapy Treatment  Patient Details  Name: [name] MRN: [id number] Date of Birth: [birth year] Referring Provider:  Sharion Balloon, FNP  Encounter Date: 03/27/2015      PT End of Session - 03/27/15 1211    Visit Number 4   Number of Visits 12   Date for PT Re-Evaluation 04/12/15   PT Start Time 1116   PT Stop Time 1208   PT Time Calculation (min) 52 min   Activity Tolerance Patient tolerated treatment well   Behavior During Therapy [facility] for tasks assessed/performed      Past Medical History  Diagnosis Date  . Hyperlipidemia   . Hypertension   . Anemia     Past Surgical History  Procedure Laterality Date  . Abdominal hysterectomy      Partial  . Orif patella Left 12/29/2014    Procedure: OPEN REDUCTION INTERNAL FIXATION LEFT PATELLA;  Surgeon: Carole Civil, MD;  Location: [facility];  Service: Orthopedics;  Laterality: Left;    There were no vitals filed for this visit.  Visit Diagnosis:  Left knee pain  Knee stiffness, left      Subjective Assessment - 03/27/15 1122    Subjective Reports that MD said she was doing good and to come back in 2 months and will have pins removed in January. Reports more pain at night than in the day.   Limitations Standing   How long can you stand comfortably? 10 minutes.   Patient Stated Goals Get nromal use of left knee again.   Currently in Pain? Yes   Pain Score 3    Pain Location Knee   Pain Orientation Left                                 PT Education - 03/27/15 1209    Education provided Yes   Education Details HEP- forward step ups; educated on safe footwear, use of father's extra cane, taking time out in public while shopping, completing HEP   Person(s) Educated Patient   Methods Explanation;Verbal cues;Handout   Comprehension Verbalized  understanding;Verbal cues required             PT Long Term Goals - 03/27/15 1152    PT LONG TERM GOAL #1   Title Ind with an HEP.   Baseline Patient requires instruction for advancement of appropriate ther ex.   Time 6   Period Weeks   Status Achieved   PT LONG TERM GOAL #2   Title Active left knee flexion= 120 degres.   Baseline 92 degrees.   Time 6   Period Weeks   Status Not Met  AROM L knee 117 deg 03/27/2015   PT LONG TERM GOAL #3   Title 5/5 left knee strength.   Baseline Significant left quadriceps atrophy and decreased volitional contraction of this musculature.   Time 6   Period Weeks   Status Not Met  L knee ext 4/5, flex 4/5   PT LONG TERM GOAL #4   Title Walk a community distance without assistive device and no antalgia.   Baseline Significant gait antalgia with a FWW.   Time 6   Period Weeks   Status Not Met               Plan - 03/27/15 1213  Clinical Impression Statement Patient tolerated treatment very well today and continues to have strength deficits but strength has improved from beginning of PT. Presented in therapy gym with L knee brace and FWW today. Was educated regarding 3pt gait pattern with [facility] but educated patient to use father's spare cane that he has at home. Patient continues to require max multimodal cueing for correct technique of new exercises and for explanation of HEP handout. Verbalized understanding of final HEP given today following max verbal cueing for explanation. Achieved all LT goals set at evaluation except for L knee flexion goal, L knee strength goal, ambulation without AD goal. Patient denied any L knee pain following today's treatment.   Pt will benefit from skilled therapeutic intervention in order to improve on the following deficits Abnormal gait;Decreased activity tolerance;Decreased range of motion;Pain;Decreased strength   PT Frequency 2x / week   PT Duration 6 weeks   PT Treatment/Interventions ADLs/Self Care  Home Management;Electrical Stimulation;Gait training;Therapeutic activities;Therapeutic exercise;Neuromuscular re-education;Patient/family education;Passive range of motion;Vasopneumatic Device   PT Next Visit Plan Communicate need for D/C summary to MPT.   Consulted and Agree with Plan of Care Patient        Problem List Patient Active Problem List   Diagnosis Date Noted  . Left patella fracture 01/10/2015  . Patella fracture 12/28/2014  . GAD (generalized anxiety disorder) 03/06/2014  . Hyperlipidemia 03/06/2014  . Essential hypertension, benign 03/06/2014   PHYSICAL THERAPY DISCHARGE SUMMARY  Visits from Start of Care: 4  Current functional level related to goals / functional outcomes: Good improvement after 4 PT visits (per Medicaid approval).   Remaining deficits: Please see goal assessment above.   Education / Equipment: HEP. Plan: Patient agrees to discharge.  Patient goals were partially met. Patient is being discharged due to meeting the stated rehab goals.  ?????       Brylee Mcgreal, [place] MPT 03/28/2015, 2:01 PM  [facility] [address] [place], [place], [address] Phone: [phone]   Fax:  [phone]

## 2015-03-30 ENCOUNTER — Telehealth: Payer: Self-pay | Admitting: Orthopedic Surgery

## 2015-03-30 ENCOUNTER — Other Ambulatory Visit: Payer: Self-pay | Admitting: Family

## 2015-03-30 NOTE — Telephone Encounter (Signed)
She is asking if its ok to wear the knee brace over a pair of jeans due to the cool damp weather, please return call asap

## 2015-03-30 NOTE — Telephone Encounter (Signed)
Advised patient yes °

## 2015-04-05 ENCOUNTER — Other Ambulatory Visit: Payer: Self-pay | Admitting: Orthopedic Surgery

## 2015-04-05 ENCOUNTER — Ambulatory Visit (INDEPENDENT_AMBULATORY_CARE_PROVIDER_SITE_OTHER): Payer: Medicaid Other | Admitting: Family

## 2015-04-05 ENCOUNTER — Encounter: Payer: Self-pay | Admitting: Family

## 2015-04-05 VITALS — BP 132/91 | HR 93 | Temp 97.9°F | Ht 65.0 in | Wt 121.0 lb

## 2015-04-05 DIAGNOSIS — H65192 Other acute nonsuppurative otitis media, left ear: Secondary | ICD-10-CM

## 2015-04-05 DIAGNOSIS — H8110 Benign paroxysmal vertigo, unspecified ear: Secondary | ICD-10-CM

## 2015-04-05 MED ORDER — ESCITALOPRAM OXALATE 20 MG PO TABS: 20.0000 mg | ORAL_TABLET | Freq: Every day | ORAL | Status: DC

## 2015-04-05 MED ORDER — AMOXICILLIN 875 MG PO TABS: 875.0000 mg | ORAL_TABLET | Freq: Two times a day (BID) | ORAL | Status: DC

## 2015-04-05 MED ORDER — MECLIZINE HCL 50 MG PO TABS: 50.0000 mg | ORAL_TABLET | Freq: Three times a day (TID) | ORAL | Status: DC | PRN

## 2015-04-05 NOTE — Patient Instructions (Signed)
Benign Positional Vertigo Vertigo is the feeling that you or your surroundings are moving when they are not. Benign positional vertigo is the most common form of vertigo. The cause of this condition is not serious (is benign). This condition is triggered by certain movements and positions (is positional). This condition can be dangerous if it occurs while you are doing something that could endanger you or others, such as driving.  CAUSES In many cases, the cause of this condition is not known. It may be caused by a disturbance in an area of the inner ear that helps your brain to sense movement and balance. This disturbance can be caused by a viral infection (labyrinthitis), head injury, or repetitive motion. RISK FACTORS This condition is more likely to develop in:  Women.  People who are 50 years of age or older. SYMPTOMS Symptoms of this condition usually happen when you move your head or your eyes in different directions. Symptoms may start suddenly, and they usually last for less than a minute. Symptoms may include:  Loss of balance and falling.  Feeling like you are spinning or moving.  Feeling like your surroundings are spinning or moving.  Nausea and vomiting.  Blurred vision.  Dizziness.  Involuntary eye movement (nystagmus). Symptoms can be mild and cause only slight annoyance, or they can be severe and interfere with daily life. Episodes of benign positional vertigo may return (recur) over time, and they may be triggered by certain movements. Symptoms may improve over time. DIAGNOSIS This condition is usually diagnosed by medical history and a physical exam of the head, neck, and ears. You may be referred to a health care provider who specializes in ear, nose, and throat (ENT) problems (otolaryngologist) or a provider who specializes in disorders of the nervous system (neurologist). You may have additional testing, including:  MRI.  A CT scan.  Eye movement tests. Your  health care provider may ask you to change positions quickly while he or she watches you for symptoms of benign positional vertigo, such as nystagmus. Eye movement may be tested with an electronystagmogram (ENG), caloric stimulation, the Dix-Hallpike test, or the roll test.  An electroencephalogram (EEG). This records electrical activity in your brain.  Hearing tests. TREATMENT Usually, your health care provider will treat this by moving your head in specific positions to adjust your inner ear back to normal. Surgery may be needed in severe cases, but this is rare. In some cases, benign positional vertigo may resolve on its own in 2-4 weeks. HOME CARE INSTRUCTIONS Safety  Move slowly.Avoid sudden body or head movements.  Avoid driving.  Avoid operating heavy machinery.  Avoid doing any tasks that would be dangerous to you or others if a vertigo episode would occur.  If you have trouble walking or keeping your balance, try using a cane for stability. If you feel dizzy or unstable, sit down right away.  Return to your normal activities as told by your health care provider. Ask your health care provider what activities are safe for you. General Instructions  Take over-the-counter and prescription medicines only as told by your health care provider.  Avoid certain positions or movements as told by your health care provider.  Drink enough fluid to keep your urine clear or pale yellow.  Keep all follow-up visits as told by your health care provider. This is important. SEEK MEDICAL CARE IF:  You have a fever.  Your condition gets worse or you develop new symptoms.  Your family or friends   notice any behavioral changes.  Your nausea or vomiting gets worse.  You have numbness or a "pins and needles" sensation. SEEK IMMEDIATE MEDICAL CARE IF:  You have difficulty speaking or moving.  You are always dizzy.  You faint.  You develop severe headaches.  You have weakness in your  legs or arms.  You have changes in your hearing or vision.  You develop a stiff neck.  You develop sensitivity to light.   This information is not intended to replace advice given to you by your health care provider. Make sure you discuss any questions you have with your health care provider.   Document Released: 03/17/2006 Document Revised: 02/28/2015 Document Reviewed: 10/02/2014 Elsevier Interactive Patient Education 2016 [employer]. Otitis Media With Effusion Otitis media with effusion is the presence of fluid in the middle ear. This is a common problem in children, which often follows ear infections. It may be present for weeks or longer after the infection. Unlike an acute ear infection, otitis media with effusion refers only to fluid behind the ear drum and not infection. Children with repeated ear and sinus infections and allergy problems are the most likely to get otitis media with effusion. CAUSES  The most frequent cause of the fluid buildup is dysfunction of the eustachian tubes. These are the tubes that drain fluid in the ears to the back of the nose (nasopharynx). SYMPTOMS   The main symptom of this condition is hearing loss. As a result, you or your child may:  Listen to the TV at a loud volume.  Not respond to questions.  Ask "what" often when spoken to.  Mistake or confuse one sound or word for another.  There may be a sensation of fullness or pressure but usually not pain. DIAGNOSIS   Your health care provider will diagnose this condition by examining you or your child's ears.  Your health care provider may test the pressure in you or your child's ear with a tympanometer.  A hearing test may be conducted if the problem persists. TREATMENT   Treatment depends on the duration and the effects of the effusion.  Antibiotics, decongestants, nose drops, and cortisone-type drugs (tablets or nasal spray) may not be helpful.  Children with persistent ear effusions  may have delayed language or behavioral problems. Children at risk for developmental delays in hearing, learning, and speech may require referral to a specialist earlier than children not at risk.  You or your child's health care provider may suggest a referral to an ear, nose, and throat surgeon for treatment. The following may help restore normal hearing:  Drainage of fluid.  Placement of ear tubes (tympanostomy tubes).  Removal of adenoids (adenoidectomy). HOME CARE INSTRUCTIONS   Avoid secondhand smoke.  Infants who are breastfed are less likely to have this condition.  Avoid feeding infants while they are lying flat.  Avoid known environmental allergens.  Avoid people who are sick. SEEK MEDICAL CARE IF:   Hearing is not better in 3 months.  Hearing is worse.  Ear pain.  Drainage from the ear.  Dizziness. MAKE SURE YOU:   Understand these instructions.  Will watch your condition.  Will get help right away if you are not doing well or get worse.   This information is not intended to replace advice given to you by your health care provider. Make sure you discuss any questions you have with your health care provider.   Document Released: 07/17/2004 Document Revised: 06/30/2014 Document Reviewed: 01/04/2013 Elsevier  Interactive Patient Education 2016 Elsevier Inc.  

## 2015-04-05 NOTE — Progress Notes (Signed)
Subjective:    Patient ID: [name], female    DOB: 1966/01/07, 49 y.o.   MRN: [id number]  Dizziness This is a new problem. The current episode started in the past 7 days. The problem occurs constantly. The problem has been waxing and waning. Associated symptoms include headaches, nausea and vertigo. Pertinent negatives include no anorexia, change in bowel habit, chest pain, chills, congestion, coughing, fever, sore throat, visual change or vomiting. The symptoms are aggravated by bending and twisting. She has tried nothing for the symptoms. The treatment provided no relief.      Review of Systems  Constitutional: Negative.  Negative for fever and chills.  HENT: Negative for congestion and sore throat.   Eyes: Negative.   Respiratory: Negative.  Negative for cough and shortness of breath.   Cardiovascular: Negative.  Negative for chest pain and palpitations.  Gastrointestinal: Positive for nausea. Negative for vomiting, anorexia and change in bowel habit.  Endocrine: Negative.   Genitourinary: Negative.   Musculoskeletal: Negative.   Neurological: Positive for dizziness, vertigo and headaches.  Hematological: Negative.   Psychiatric/Behavioral: Negative.   All other systems reviewed and are negative.      Objective:   Physical Exam  Constitutional: She is oriented to person, place, and time. She appears well-developed and well-nourished. No distress.  HENT:  Head: Normocephalic and atraumatic.  Right Ear: A middle ear effusion is present.  Left Ear: There is swelling. Tympanic membrane is erythematous (mildy). A middle ear effusion is present.  Nose: Nose normal.  Oropharynx erythemas   Eyes: Pupils are equal, round, and reactive to light.  Neck: Normal range of motion. Neck supple. No thyromegaly present.  Cardiovascular: Normal rate, regular rhythm, normal heart sounds and intact distal pulses.   No murmur heard. Pulmonary/Chest: Effort normal and breath sounds  normal. No respiratory distress. She has no wheezes.  Abdominal: Soft. Bowel sounds are normal. She exhibits no distension. There is no tenderness.  Musculoskeletal: Normal range of motion. She exhibits no edema or tenderness.  Neurological: She is alert and oriented to person, place, and time. She has normal reflexes. No cranial nerve deficit.  Skin: Skin is warm and dry.  Psychiatric: She has a normal mood and affect. Her behavior is normal. Judgment and thought content normal.  Vitals reviewed.     BP 132/91 mmHg  Pulse 93  Temp(Src) 97.9 F (36.6 C) (Oral)  Ht 5\' 5"  (1.651 m)  Wt 121 lb (54.885 kg)  BMI 20.14 kg/m2]    Assessment & Plan:  1. BPPV (benign paroxysmal positional vertigo), unspecified laterality -Falls precaution discussed -Avoid fast movements, bending, or positional changes -Handout given and exercises discussed to help - meclizine (ANTIVERT) 50 MG tablet; Take 1 tablet (50 mg total) by mouth 3 (three) times daily as needed.  Dispense: 30 tablet; Refill: 0  2. Acute nonsuppurative otitis media of left ear -- Take meds as prescribed - Use a cool mist humidifier  -Use saline nose sprays frequently -Saline irrigations of the nose can be very helpful if done frequently.  * 4X daily for 1 week*  * Use of a nettie pot can be helpful with this. Follow directions with this* -Force fluids -For any cough or congestion  Use plain Mucinex- regular strength or max strength is fine   * Children- consult with Pharmacist for dosing -For fever or aces or pains- take tylenol or ibuprofen appropriate for age and weight.  * for fevers greater than 101 orally you may  alternate ibuprofen and tylenol every  3 hours. - amoxicillin (AMOXIL) 875 MG tablet; Take 1 tablet (875 mg total) by mouth 2 (two) times daily.  Dispense: 20 tablet; Refill: 0  Evelina Dun, FNP

## 2015-04-17 ENCOUNTER — Ambulatory Visit (INDEPENDENT_AMBULATORY_CARE_PROVIDER_SITE_OTHER): Payer: Medicaid Other | Admitting: Family

## 2015-04-17 ENCOUNTER — Encounter: Payer: Self-pay | Admitting: Family

## 2015-04-17 VITALS — BP 138/90 | HR 90 | Temp 98.3°F | Ht 65.0 in | Wt 128.0 lb

## 2015-04-17 DIAGNOSIS — E785 Hyperlipidemia, unspecified: Secondary | ICD-10-CM | POA: Diagnosis not present

## 2015-04-17 DIAGNOSIS — I10 Essential (primary) hypertension: Secondary | ICD-10-CM | POA: Diagnosis not present

## 2015-04-17 DIAGNOSIS — H8110 Benign paroxysmal vertigo, unspecified ear: Secondary | ICD-10-CM

## 2015-04-17 DIAGNOSIS — Z23 Encounter for immunization: Secondary | ICD-10-CM

## 2015-04-17 DIAGNOSIS — F411 Generalized anxiety disorder: Secondary | ICD-10-CM | POA: Diagnosis not present

## 2015-04-17 MED ORDER — MECLIZINE HCL 50 MG PO TABS: 50.0000 mg | ORAL_TABLET | Freq: Three times a day (TID) | ORAL | Status: DC | PRN

## 2015-04-17 NOTE — Progress Notes (Signed)
Subjective:    Patient ID: [name], female    DOB: May 19, 1966, 49 y.o.   MRN: [id number]  Pt presents to the office today for chronic follow up.  Hypertension This is a chronic problem. The current episode started more than 1 year ago. The problem has been waxing and waning since onset. The problem is uncontrolled. Associated symptoms include anxiety. Pertinent negatives include no headaches, palpitations, peripheral edema or shortness of breath. Risk factors for coronary artery disease include dyslipidemia and family history. Past treatments include nothing. The current treatment provides no improvement. There is no history of kidney disease, CAD/MI, CVA, heart failure or a thyroid problem. There is no history of sleep apnea.  Hyperlipidemia This is a chronic problem. The current episode started more than 1 year ago. The problem is uncontrolled. Recent lipid tests were reviewed and are high. She has no history of diabetes or hypothyroidism. Pertinent negatives include no leg pain, myalgias or shortness of breath. Current antihyperlipidemic treatment includes statins. The current treatment provides significant improvement of lipids. Risk factors for coronary artery disease include dyslipidemia, family history, hypertension and a sedentary lifestyle.  Anxiety Presents for follow-up visit. Patient reports no compulsions, decreased concentration, depressed mood, hyperventilation, irritability, nervous/anxious behavior, palpitations or shortness of breath. Symptoms occur rarely. The severity of symptoms is moderate. The quality of sleep is good.   Her past medical history is significant for anxiety/panic attacks. There is no history of CAD or depression. Past treatments include SSRIs. Compliance with prior treatments has been good.      Review of Systems  Constitutional: Negative.  Negative for irritability.  HENT: Negative.   Eyes: Negative.   Respiratory: Negative.  Negative for shortness  of breath.   Cardiovascular: Negative.  Negative for palpitations.  Gastrointestinal: Negative.   Endocrine: Negative.   Genitourinary: Negative.   Musculoskeletal: Negative.  Negative for myalgias.  Neurological: Negative.  Negative for headaches.  Hematological: Negative.   Psychiatric/Behavioral: Negative.  Negative for decreased concentration. The patient is not nervous/anxious.   All other systems reviewed and are negative.      Objective:   Physical Exam  Constitutional: She is oriented to person, place, and time. She appears well-developed and well-nourished. No distress.  HENT:  Head: Normocephalic and atraumatic.  Right Ear: External ear normal.  Left Ear: External ear normal.  Nose: Nose normal.  Mouth/Throat: Oropharynx is clear and moist.  Eyes: Pupils are equal, round, and reactive to light.  Neck: Normal range of motion. Neck supple. No thyromegaly present.  Cardiovascular: Normal rate, regular rhythm, normal heart sounds and intact distal pulses.   No murmur heard. Pulmonary/Chest: Effort normal and breath sounds normal. No respiratory distress. She has no wheezes.  Abdominal: Soft. Bowel sounds are normal. She exhibits no distension. There is no tenderness.  Musculoskeletal: Normal range of motion. She exhibits no edema or tenderness.  Neurological: She is alert and oriented to person, place, and time. She has normal reflexes. No cranial nerve deficit.  Skin: Skin is warm and dry.  Psychiatric: She has a normal mood and affect. Her behavior is normal. Judgment and thought content normal.  Vitals reviewed.     BP 139/93 mmHg  Pulse 90  Temp(Src) 98.3 F (36.8 C) (Oral)  Ht _0  (1.651 m)  Wt 128 lb (58.06 kg)  BMI 21.30 kg/m2     Assessment & Plan:  1. Essential hypertension, benign - CMP14+EGFR  2. Hyperlipidemia - CMP14+EGFR - Lipid panel  3. GAD (  generalized anxiety disorder) - CMP14+EGFR  4. BPPV (benign paroxysmal positional vertigo),  unspecified laterality - CMP14+EGFR - meclizine (ANTIVERT) 50 MG tablet; Take 1 tablet (50 mg total) by mouth 3 (three) times daily as needed.  Dispense: 30 tablet; Refill: 0   Continue all meds Labs pending Health Maintenance reviewed-Flu vaccine given today Diet and exercise encouraged RTO 6 months   Evelina Dun, FNP

## 2015-04-17 NOTE — Patient Instructions (Signed)

## 2015-04-18 LAB — CMP14+EGFR
A/G RATIO: 2.1 (ref 1.1–2.5)
ALT: 23 IU/L (ref 0–32)
AST: 16 IU/L (ref 0–40)
Albumin: 4.4 g/dL (ref 3.5–5.5)
Alkaline Phosphatase: 51 IU/L (ref 39–117)
BUN/Creatinine Ratio: 13 (ref 9–23)
BUN: 9 mg/dL (ref 6–24)
CHLORIDE: 100 mmol/L (ref 97–106)
CO2: 29 mmol/L (ref 18–29)
Calcium: 9.3 mg/dL (ref 8.7–10.2)
Creatinine, Ser: 0.72 mg/dL (ref 0.57–1.00)
GFR calc Af Amer: 114 mL/min/{1.73_m2} (ref >59)
GFR calc non Af Amer: 99 mL/min/{1.73_m2} (ref >59)
GLUCOSE: 102 mg/dL — AB (ref 65–99)
Globulin, Total: 2.1 g/dL (ref 1.5–4.5)
POTASSIUM: 4.2 mmol/L (ref 3.5–5.2)
Sodium: 141 mmol/L (ref 136–144)
Total Protein: 6.5 g/dL (ref 6.0–8.5)

## 2015-04-18 LAB — LIPID PANEL
CHOL/HDL RATIO: 2.7 ratio (ref 0.0–4.4)
CHOLESTEROL TOTAL: 159 mg/dL (ref 100–199)
HDL: 60 mg/dL (ref >39)
LDL Calculated: 65 mg/dL (ref 0–99)
Triglycerides: 171 mg/dL — ABNORMAL HIGH (ref 0–149)
VLDL CHOLESTEROL CAL: 34 mg/dL (ref 5–40)

## 2015-04-26 ENCOUNTER — Other Ambulatory Visit: Payer: Self-pay | Admitting: Orthopedic Surgery

## 2015-05-04 ENCOUNTER — Other Ambulatory Visit: Payer: Self-pay | Admitting: *Deleted

## 2015-05-04 MED ORDER — TRAMADOL HCL 50 MG PO TABS
50.0000 mg | ORAL_TABLET | Freq: Four times a day (QID) | ORAL | Status: DC | PRN
Start: 2015-05-04 — End: 2015-12-11

## 2015-05-22 ENCOUNTER — Ambulatory Visit (INDEPENDENT_AMBULATORY_CARE_PROVIDER_SITE_OTHER): Payer: Medicaid Other | Admitting: Orthopedic Surgery

## 2015-05-22 VITALS — BP 124/72 | Ht 65.0 in | Wt 128.0 lb

## 2015-05-22 DIAGNOSIS — S82002S Unspecified fracture of left patella, sequela: Secondary | ICD-10-CM

## 2015-05-22 NOTE — Progress Notes (Signed)
Chief Complaint  Patient presents with  . Follow-up    2 month follow up left knee, s/p patella fx, DOS 12/29/14    Patella fracture 5-6 months ago she's had problems with her hardware present on the skin.   Systems negative  Exam BP 124/72 mmHg  Ht 5\' 5"  (1.651 m)  Wt 128 lb (58.06 kg)  BMI 21.30 kg/m2 Incision looks good there 2 prominent areas of hardware with reactive bursitis motor exam straight leg raise intact knee flexion 130 AP stability normal neurovascular exam normal  Recommend hardware removal after Christmas

## 2015-05-22 NOTE — Addendum Note (Signed)
Addended by: Baldomero Lamy B on: 05/22/2015 05:02 PM   Modules accepted: Orders

## 2015-05-22 NOTE — Patient Instructions (Signed)
Surgery 06/20/15, left knee hardware removal

## 2015-05-29 ENCOUNTER — Telehealth: Payer: Self-pay | Admitting: Orthopedic Surgery

## 2015-06-05 ENCOUNTER — Telehealth: Payer: Self-pay | Admitting: *Deleted

## 2015-06-05 ENCOUNTER — Other Ambulatory Visit: Payer: Self-pay | Admitting: *Deleted

## 2015-06-05 DIAGNOSIS — S82002D Unspecified fracture of left patella, subsequent encounter for closed fracture with routine healing: Secondary | ICD-10-CM

## 2015-06-05 MED ORDER — IBUPROFEN 800 MG PO TABS: 800.0000 mg | ORAL_TABLET | Freq: Three times a day (TID) | ORAL | Status: DC | PRN

## 2015-06-05 NOTE — Telephone Encounter (Signed)
Spoke with patient, reviewed pre op dates, refilled Ibuprofen per patient request

## 2015-06-05 NOTE — Telephone Encounter (Signed)
Patient called stating someone from here was wanting to know the medications she is on before her surgery. Please advise [phone]

## 2015-06-11 ENCOUNTER — Other Ambulatory Visit: Payer: Self-pay | Admitting: *Deleted

## 2015-06-11 DIAGNOSIS — S82002D Unspecified fracture of left patella, subsequent encounter for closed fracture with routine healing: Secondary | ICD-10-CM

## 2015-06-11 MED ORDER — IBUPROFEN 800 MG PO TABS: 800.0000 mg | ORAL_TABLET | Freq: Three times a day (TID) | ORAL | Status: DC | PRN

## 2015-06-11 NOTE — Telephone Encounter (Signed)
Resent prescription to pharmacy.

## 2015-06-11 NOTE — Telephone Encounter (Signed)
Patient called back to follow up on refill of medication: ibuprofen (ADVIL,MOTRIN) 800 MG tablet [id number]  -  This refill appears in her chart for [facility] in [place], 06/05/15; however, she said that they don't have it.  Please review and advise.  Patient [phone] (Home)

## 2015-06-12 ENCOUNTER — Other Ambulatory Visit: Payer: Self-pay | Admitting: *Deleted

## 2015-06-12 DIAGNOSIS — S82002D Unspecified fracture of left patella, subsequent encounter for closed fracture with routine healing: Secondary | ICD-10-CM

## 2015-06-12 MED ORDER — IBUPROFEN 800 MG PO TABS: 800.0000 mg | ORAL_TABLET | Freq: Three times a day (TID) | ORAL | Status: DC | PRN

## 2015-06-12 NOTE — Telephone Encounter (Signed)
This med has been sent electronically twice and manually faxed once

## 2015-06-12 NOTE — Telephone Encounter (Signed)
Patient called stating she needs her prescription ibuprofen to be refilled. Please advise [phone]

## 2015-06-13 NOTE — Patient Instructions (Signed)
[name]  06/13/2015     @PREFPERIOPPHARMACY @   Your procedure is scheduled on 06/20/15.  Report to [facility] at 7:00 A.M.  Call this number if you have problems the morning of surgery:  [phone]   Remember:  Do not eat food or drink liquids after midnight.  Take these medicines the morning of surgery with A SIP OF WATER Lexapro, Ultram if needed   Do not wear jewelry, make-up or nail polish.  Do not wear lotions, powders, or perfumes.  You may wear deodorant.  Do not shave 48 hours prior to surgery.  Men may shave face and neck.  Do not bring valuables to the hospital.  [facility] is not responsible for any belongings or valuables.  Contacts, dentures or bridgework may not be worn into surgery.  Leave your suitcase in the car.  After surgery it may be brought to your room.  For patients admitted to the hospital, discharge time will be determined by your treatment team.  Patients discharged the day of surgery will not be allowed to drive home.    Please read over the following fact sheets that you were given. Surgical Site Infection Prevention and Anesthesia Post-op Instructions     PATIENT INSTRUCTIONS POST-ANESTHESIA  IMMEDIATELY FOLLOWING SURGERY:  Do not drive or operate machinery for the first twenty four hours after surgery.  Do not make any important decisions for twenty four hours after surgery or while taking narcotic pain medications or sedatives.  If you develop intractable nausea and vomiting or a severe headache please notify your doctor immediately.  FOLLOW-UP:  Please make an appointment with your surgeon as instructed. You do not need to follow up with anesthesia unless specifically instructed to do so.  WOUND CARE INSTRUCTIONS (if applicable):  Keep a dry clean dressing on the anesthesia/puncture wound site if there is drainage.  Once the wound has quit draining you may leave it open to air.  Generally you should leave the bandage intact for  twenty four hours unless there is drainage.  If the epidural site drains for more than 36-48 hours please call the anesthesia department.  QUESTIONS?:  Please feel free to call your physician or the hospital operator if you have any questions, and they will be happy to assist you.      Hardware Removal Hardware removal is a procedure to remove from the body any medical parts that were used to repair a broken bone, such as pins, screws, rods, wires, and plates. This procedure may be done to:  Remove medical parts that are normally removed after a broken bone has healed.  Remove medical parts that are causing problems, such as infection or pain.  Remove medical parts that are not working.  Replace medical parts with newer, better materials. LET [facility] CARE PROVIDER KNOW ABOUT:  Any allergies you have.  All medicines you are taking, including vitamins, herbs, eye drops, creams, and over-the-counter medicines. This includes any use of steroids, either by mouth or in cream form.  Previous problems you or members of your family have had with the use of anesthetics.  Any blood disorders you have.  Previous surgeries you have had.  Any medical conditions you may have.  Possibility of pregnancy, if this applies. RISKS AND COMPLICATIONS Generally, this is a safe procedure. However, problems may occur, including:  Infection.  Bleeding.  Pain.  The bone breaking again (refracture).  A failure to completely remove the medical parts. BEFORE THE  PROCEDURE  Follow instructions from your health care provider about eating or drinking restrictions.  Ask your health care provider about:  Changing or stopping your regular medicines. This is especially important if you are taking diabetes medicines or blood thinners.  Taking medicines such as aspirin and ibuprofen. These medicines can thin your blood. Do not take these medicines before your procedure if your health care provider  instructs you not to.  Plan to have someone take you home after the procedure.  If you go home right after the procedure, plan to have someone with you for 24 hours. PROCEDURE  You will lie on an exam table.  Several monitors will be connected to you to keep track of your heart rate, blood pressure, and breathing during the procedure.  An IV tube will be inserted into one of your veins.  You will be given one or more of the following:  A medicine that helps you relax (sedative).  A medicine that numbs the area (local anesthetic).  A medicine that makes you fall asleep (general anesthetic).  A medicine that is injected into your spine that numbs the area below and slightly above the injection site (spinal anesthetic).  X-rays may be taken.  The surgeon will make an incision over the area where the medical parts are located.  The medical parts will be removed.  The incision will be closed with stitches (sutures), staples, or surgical glue.  A bandage (dressing) will be placed over the incision site to keep it clean and dry.  A splint, cast, or removable walking boot may be applied until the area heals. The procedure may vary among health care providers and hospitals. AFTER THE PROCEDURE  Your blood pressure, heart rate, breathing rate, and blood oxygen level will be monitored often until the medicines you were given have worn off.  You will stay in the recovery room until you are awake and able to drink fluids.  You may be sleepy and nauseous, and you may feel some pain.   This information is not intended to replace advice given to you by your health care provider. Make sure you discuss any questions you have with your health care provider.   Document Released: 04/06/2009 Document Revised: 10/24/2014 Document Reviewed: 06/05/2014 Elsevier Interactive Patient Education [employer].

## 2015-06-14 ENCOUNTER — Encounter (HOSPITAL_COMMUNITY)
Admission: RE | Admit: 2015-06-14 | Discharge: 2015-06-14 | Disposition: A | Discharge status: Home / Self Care | Payer: Medicaid Other | Source: Ambulatory Visit | Attending: Orthopedic Surgery | Admitting: Orthopedic Surgery

## 2015-06-14 ENCOUNTER — Encounter (HOSPITAL_COMMUNITY): Payer: Self-pay

## 2015-06-14 DIAGNOSIS — Z01812 Encounter for preprocedural laboratory examination: Secondary | ICD-10-CM | POA: Diagnosis not present

## 2015-06-14 DIAGNOSIS — X58XXXA Exposure to other specified factors, initial encounter: Secondary | ICD-10-CM | POA: Diagnosis not present

## 2015-06-14 DIAGNOSIS — S82002D Unspecified fracture of left patella, subsequent encounter for closed fracture with routine healing: Secondary | ICD-10-CM | POA: Diagnosis not present

## 2015-06-14 LAB — CBC
HEMATOCRIT: 38.6 % (ref 36.0–46.0)
Hemoglobin: 12.8 g/dL (ref 12.0–15.0)
MCH: 32.7 pg (ref 26.0–34.0)
MCHC: 33.2 g/dL (ref 30.0–36.0)
MCV: 98.5 fL (ref 78.0–100.0)
PLATELETS: 315 10*3/uL (ref 150–400)
RBC: 3.92 MIL/uL (ref 3.87–5.11)
RDW: 12.2 % (ref 11.5–15.5)
WBC: 8.7 10*3/uL (ref 4.0–10.5)

## 2015-06-14 LAB — BASIC METABOLIC PANEL
ANION GAP: 7 (ref 5–15)
BUN: 8 mg/dL (ref 6–20)
CALCIUM: 9.2 mg/dL (ref 8.9–10.3)
CO2: 30 mmol/L (ref 22–32)
CREATININE: 0.64 mg/dL (ref 0.44–1.00)
Chloride: 101 mmol/L (ref 101–111)
Glucose, Bld: 83 mg/dL (ref 65–99)
Potassium: 4.4 mmol/L (ref 3.5–5.1)
SODIUM: 138 mmol/L (ref 135–145)

## 2015-06-15 ENCOUNTER — Telehealth: Payer: Self-pay | Admitting: Orthopedic Surgery

## 2015-06-15 NOTE — Telephone Encounter (Addendum)
Regarding out-patient surgery scheduled 06/20/15 at [facility], CPT 20680, no pre-authorization required per insurer, Medicaid's, on-line portal, [place] [facility]. *(nor for 20670)

## 2015-06-19 NOTE — H&P (Signed)
[name] is an 49 y.o. female.    Chief Complaint: Pain left knee secondary to hardware after open treatment internal fixation left patella HPI: Surgical intervention for fractured left patella patient developed painful hardware presents for hardware removal. Otherwise doing well ambulating well mild discomfort relieved by NSAIDs. She also developed a reactive bursitis over the painful hardware.  Past Medical History  Diagnosis Date  . Hyperlipidemia   . Hypertension   . Anemia     Past Surgical History  Procedure Laterality Date  . Abdominal hysterectomy      Partial  . Orif patella Left 12/29/2014    Procedure: OPEN REDUCTION INTERNAL FIXATION LEFT PATELLA;  Surgeon: Carole Civil, MD;  Location: [facility];  Service: Orthopedics;  Laterality: Left;    Family History  Problem Relation Age of Onset  . COPD Mother   . COPD Father    Social History:  reports that she has been smoking.  She does not have any smokeless tobacco history on file. She reports that she does not drink alcohol or use illicit drugs.  Allergies:  Allergies  Allergen Reactions  . Codeine Other (See Comments)    Makes pt.have seizure  . Hydrocodone     May have caused a seizure per patient reported.    No prescriptions prior to admission    No results found for this or any previous visit (from the past 48 hour(s)). No results found.  Review of Systems  All other systems reviewed and are negative.   There were no vitals taken for this visit. Physical Exam  Constitutional: She is oriented to person, place, and time. She appears well-developed and well-nourished. No distress.  HENT:  Head: Normocephalic and atraumatic.  Eyes: Conjunctivae are normal. Pupils are equal, round, and reactive to light. Right eye exhibits no discharge. Left eye exhibits no discharge.  Neck: Normal range of motion. Neck supple. No thyromegaly present.  Cardiovascular: Normal rate and intact distal pulses.    Respiratory: Effort normal.  GI: Soft.  Musculoskeletal:  Left knee midline incision healed well no tenderness lateral portion of the knee prominent area tender swollen crepitance over the hardware knee range of motion return to normal strength including quadriceps normal knee stable neurovascular exam intact other extremities uninvolved  Lymphadenopathy:    She has no cervical adenopathy.  Neurological: She is alert and oriented to person, place, and time. She displays normal reflexes. She exhibits abnormal muscle tone. Coordination normal.  Skin: Skin is warm and dry. No rash noted. She is not diaphoretic. No erythema. No pallor.  Psychiatric: She has a normal mood and affect.     Assessment/Plan Painful hardware left knee x-ray shows fracture healing has occurred  Plan is to remove hardware left knee  Arther Abbott 06/19/2015, 5:28 PM

## 2015-06-20 ENCOUNTER — Encounter (HOSPITAL_COMMUNITY): Payer: Self-pay | Admitting: *Deleted

## 2015-06-20 ENCOUNTER — Ambulatory Visit (HOSPITAL_COMMUNITY)
Admission: RE | Admit: 2015-06-20 | Discharge: 2015-06-20 | Disposition: A | Discharge status: Home / Self Care | Payer: Medicaid Other | Source: Ambulatory Visit | Attending: Orthopedic Surgery | Admitting: Orthopedic Surgery

## 2015-06-20 ENCOUNTER — Ambulatory Visit (HOSPITAL_COMMUNITY): Payer: Medicaid Other | Admitting: Anesthesiology

## 2015-06-20 ENCOUNTER — Encounter (HOSPITAL_COMMUNITY)
Admission: RE | Disposition: A | Discharge status: Home / Self Care | Payer: Self-pay | Source: Ambulatory Visit | Attending: Orthopedic Surgery

## 2015-06-20 DIAGNOSIS — T8484XA Pain due to internal orthopedic prosthetic devices, implants and grafts, initial encounter: Secondary | ICD-10-CM | POA: Insufficient documentation

## 2015-06-20 DIAGNOSIS — S82002S Unspecified fracture of left patella, sequela: Secondary | ICD-10-CM | POA: Diagnosis not present

## 2015-06-20 DIAGNOSIS — E785 Hyperlipidemia, unspecified: Secondary | ICD-10-CM | POA: Diagnosis not present

## 2015-06-20 DIAGNOSIS — Y834 Other reconstructive surgery as the cause of abnormal reaction of the patient, or of later complication, without mention of misadventure at the time of the procedure: Secondary | ICD-10-CM | POA: Insufficient documentation

## 2015-06-20 DIAGNOSIS — F17200 Nicotine dependence, unspecified, uncomplicated: Secondary | ICD-10-CM | POA: Diagnosis not present

## 2015-06-20 DIAGNOSIS — I10 Essential (primary) hypertension: Secondary | ICD-10-CM | POA: Insufficient documentation

## 2015-06-20 DIAGNOSIS — Z90710 Acquired absence of both cervix and uterus: Secondary | ICD-10-CM | POA: Diagnosis not present

## 2015-06-20 DIAGNOSIS — Z8781 Personal history of (healed) traumatic fracture: Secondary | ICD-10-CM | POA: Insufficient documentation

## 2015-06-20 HISTORY — PX: HARDWARE REMOVAL: SHX979

## 2015-06-20 SURGERY — REMOVAL, HARDWARE
Anesthesia: General | Site: Knee | Laterality: Left

## 2015-06-20 MED ORDER — FENTANYL CITRATE (PF) 100 MCG/2ML IJ SOLN
25.0000 ug | INTRAMUSCULAR | Status: AC
  Administered 2015-06-20: 25 ug via INTRAVENOUS

## 2015-06-20 MED ORDER — LIDOCAINE HCL (PF) 1 % IJ SOLN
INTRAMUSCULAR | Status: AC
  Filled 2015-06-20: qty 5

## 2015-06-20 MED ORDER — MIDAZOLAM HCL 2 MG/2ML IJ SOLN
1.0000 mg | INTRAMUSCULAR | Status: DC | PRN
  Administered 2015-06-20: 2 mg via INTRAVENOUS

## 2015-06-20 MED ORDER — ONDANSETRON HCL 4 MG/2ML IJ SOLN
INTRAMUSCULAR | Status: AC
  Filled 2015-06-20: qty 2

## 2015-06-20 MED ORDER — EPHEDRINE SULFATE 50 MG/ML IJ SOLN
INTRAMUSCULAR | Status: DC | PRN
  Administered 2015-06-20: 10 mg via INTRAVENOUS

## 2015-06-20 MED ORDER — FENTANYL CITRATE (PF) 100 MCG/2ML IJ SOLN: 25.0000 ug | INTRAMUSCULAR | Status: DC | PRN

## 2015-06-20 MED ORDER — BUPIVACAINE-EPINEPHRINE (PF) 0.5% -1:200000 IJ SOLN
INTRAMUSCULAR | Status: AC
  Filled 2015-06-20: qty 60

## 2015-06-20 MED ORDER — HYDROMORPHONE HCL 2 MG PO TABS: 2.0000 mg | ORAL_TABLET | Freq: Four times a day (QID) | ORAL | Status: DC | PRN

## 2015-06-20 MED ORDER — LIDOCAINE HCL (CARDIAC) 20 MG/ML IV SOLN
INTRAVENOUS | Status: DC | PRN
  Administered 2015-06-20: 30 mg via INTRATRACHEAL

## 2015-06-20 MED ORDER — FENTANYL CITRATE (PF) 250 MCG/5ML IJ SOLN
INTRAMUSCULAR | Status: AC
  Filled 2015-06-20: qty 5

## 2015-06-20 MED ORDER — SODIUM CHLORIDE 0.9 % IR SOLN
Status: DC | PRN
  Administered 2015-06-20: 1000 mL

## 2015-06-20 MED ORDER — PROPOFOL 10 MG/ML IV BOLUS
INTRAVENOUS | Status: AC
  Filled 2015-06-20: qty 20

## 2015-06-20 MED ORDER — LACTATED RINGERS IV SOLN
INTRAVENOUS | Status: DC
  Administered 2015-06-20: 08:00:00 via INTRAVENOUS

## 2015-06-20 MED ORDER — BUPIVACAINE-EPINEPHRINE 0.5% -1:200000 IJ SOLN
INTRAMUSCULAR | Status: DC | PRN
  Administered 2015-06-20: 30 mL

## 2015-06-20 MED ORDER — CEFAZOLIN SODIUM-DEXTROSE 2-3 GM-% IV SOLR
2.0000 g | INTRAVENOUS | Status: AC
  Administered 2015-06-20: 2 g via INTRAVENOUS
  Filled 2015-06-20: qty 50

## 2015-06-20 MED ORDER — ONDANSETRON HCL 4 MG/2ML IJ SOLN: 4.0000 mg | Freq: Once | INTRAMUSCULAR | Status: DC | PRN

## 2015-06-20 MED ORDER — FENTANYL CITRATE (PF) 250 MCG/5ML IJ SOLN
INTRAMUSCULAR | Status: DC | PRN
  Administered 2015-06-20: 25 ug via INTRAVENOUS
  Administered 2015-06-20 (×2): 50 ug via INTRAVENOUS
  Administered 2015-06-20: 25 ug via INTRAVENOUS

## 2015-06-20 MED ORDER — SODIUM CHLORIDE 0.9 % IJ SOLN
INTRAMUSCULAR | Status: AC
  Filled 2015-06-20: qty 10

## 2015-06-20 MED ORDER — ONDANSETRON HCL 4 MG/2ML IJ SOLN
4.0000 mg | Freq: Once | INTRAMUSCULAR | Status: AC
  Administered 2015-06-20: 4 mg via INTRAVENOUS

## 2015-06-20 MED ORDER — CHLORHEXIDINE GLUCONATE 4 % EX LIQD: 60.0000 mL | Freq: Once | CUTANEOUS | Status: DC

## 2015-06-20 MED ORDER — FENTANYL CITRATE (PF) 100 MCG/2ML IJ SOLN
INTRAMUSCULAR | Status: AC
  Filled 2015-06-20: qty 2

## 2015-06-20 MED ORDER — MIDAZOLAM HCL 2 MG/2ML IJ SOLN
INTRAMUSCULAR | Status: AC
  Filled 2015-06-20: qty 2

## 2015-06-20 MED ORDER — EPHEDRINE SULFATE 50 MG/ML IJ SOLN
INTRAMUSCULAR | Status: AC
  Filled 2015-06-20: qty 1

## 2015-06-20 SURGICAL SUPPLY — 48 items
BAG HAMPER (MISCELLANEOUS) ×3 IMPLANT
BANDAGE ELASTIC 6 VELCRO NS (GAUZE/BANDAGES/DRESSINGS) ×3 IMPLANT
BANDAGE ESMARK 4X12 BL STRL LF (DISPOSABLE) ×1 IMPLANT
BLADE 10 SAFETY STRL DISP (BLADE) ×3 IMPLANT
BNDG COHESIVE 4X5 TAN STRL (GAUZE/BANDAGES/DRESSINGS) ×3 IMPLANT
BNDG ESMARK 4X12 BLUE STRL LF (DISPOSABLE) ×3
CHLORAPREP W/TINT 26ML (MISCELLANEOUS) ×3 IMPLANT
CLOTH BEACON ORANGE TIMEOUT ST (SAFETY) ×3 IMPLANT
COVER LIGHT HANDLE STERIS (MISCELLANEOUS) ×12 IMPLANT
CUFF TOURNIQUET SINGLE 24IN (TOURNIQUET CUFF) IMPLANT
CUFF TOURNIQUET SINGLE 34IN LL (TOURNIQUET CUFF) ×3 IMPLANT
CUFF TOURNIQUET SINGLE 44IN (TOURNIQUET CUFF) IMPLANT
DECANTER SPIKE VIAL GLASS SM (MISCELLANEOUS) ×3 IMPLANT
DRAPE C-ARM FOLDED MOBILE STRL (DRAPES) IMPLANT
DRAPE OEC MINIVIEW 54X84 (DRAPES) IMPLANT
ELECT REM PT RETURN 9FT ADLT (ELECTROSURGICAL) ×3
ELECTRODE REM PT RTRN 9FT ADLT (ELECTROSURGICAL) ×1 IMPLANT
GAUZE XEROFORM 5X9 LF (GAUZE/BANDAGES/DRESSINGS) ×3 IMPLANT
GLOVE BIOGEL PI IND STRL 7.0 (GLOVE) ×1 IMPLANT
GLOVE BIOGEL PI INDICATOR 7.0 (GLOVE) ×2
GLOVE SKINSENSE NS SZ8.0 LF (GLOVE) ×2
GLOVE SKINSENSE STRL SZ8.0 LF (GLOVE) ×1 IMPLANT
GLOVE SS N UNI LF 8.5 STRL (GLOVE) ×3 IMPLANT
GOWN STRL REUS W/TWL LRG LVL3 (GOWN DISPOSABLE) ×6 IMPLANT
GOWN STRL REUS W/TWL XL LVL3 (GOWN DISPOSABLE) ×3 IMPLANT
INST SET MINOR BONE (KITS) ×3 IMPLANT
KIT ROOM TURNOVER APOR (KITS) ×3 IMPLANT
MANIFOLD NEPTUNE II (INSTRUMENTS) ×3 IMPLANT
NEEDLE HYPO 21X1 ECLIPSE (NEEDLE) ×3 IMPLANT
NEEDLE HYPO 21X1.5 SAFETY (NEEDLE) ×3 IMPLANT
NS IRRIG 1000ML POUR BTL (IV SOLUTION) ×3 IMPLANT
PACK BASIC LIMB (CUSTOM PROCEDURE TRAY) ×3 IMPLANT
PAD ABD 5X9 TENDERSORB (GAUZE/BANDAGES/DRESSINGS) IMPLANT
PAD ARMBOARD 7.5X6 YLW CONV (MISCELLANEOUS) ×3 IMPLANT
PAD CAST 4YDX4 CTTN HI CHSV (CAST SUPPLIES) ×1 IMPLANT
PADDING CAST COTTON 4X4 STRL (CAST SUPPLIES) ×2
PADDING WEBRIL 6 STERILE (GAUZE/BANDAGES/DRESSINGS) ×3 IMPLANT
SET BASIN LINEN APH (SET/KITS/TRAYS/PACK) ×3 IMPLANT
SPONGE GAUZE 4X4 12PLY (GAUZE/BANDAGES/DRESSINGS) ×3 IMPLANT
SPONGE LAP 18X18 X RAY DECT (DISPOSABLE) ×3 IMPLANT
SPONGE LAP 4X18 X RAY DECT (DISPOSABLE) IMPLANT
STAPLER VISISTAT 35W (STAPLE) IMPLANT
SUT ETHILON 3 0 FSL (SUTURE) ×3 IMPLANT
SUT MON AB 0 CT1 (SUTURE) ×3 IMPLANT
SUT MON AB 2-0 CT1 36 (SUTURE) ×3 IMPLANT
SUT PROLENE 4 0 PS 2 18 (SUTURE) IMPLANT
SYR 3ML LL SCALE MARK (SYRINGE) ×3 IMPLANT
SYR BULB IRRIGATION 50ML (SYRINGE) ×6 IMPLANT

## 2015-06-20 NOTE — Interval H&P Note (Signed)
History and Physical Interval Note:  06/20/2015 8:31 AM  [name]  has presented today for surgery, with the diagnosis of left patella fracture  The various methods of treatment have been discussed with the patient and family. After consideration of risks, benefits and other options for treatment, the patient has consented to  Procedure(s) with comments: HARDWARE REMOVAL (Left) - left knee as a surgical intervention .  The patient's history has been reviewed, patient examined, no change in status, stable for surgery.  I have reviewed the patient's chart and labs.  Questions were answered to the patient's satisfaction.     Arther Abbott

## 2015-06-20 NOTE — Anesthesia Procedure Notes (Signed)
Procedure Name: Intubation Date/Time: 06/20/2015 8:46 AM Performed by: Tressie Stalker E Pre-anesthesia Checklist: Patient identified, Patient being monitored, Timeout performed, Emergency Drugs available and Suction available Patient Re-evaluated:Patient Re-evaluated prior to inductionOxygen Delivery Method: Circle System Utilized Preoxygenation: Pre-oxygenation with 100% oxygen Intubation Type: IV induction Ventilation: Mask ventilation without difficulty Laryngoscope Size: Mac and 3 Grade View: Grade I Tube type: Oral Tube size: 7.0 mm Number of attempts: 1 Airway Equipment and Method: Stylet Placement Confirmation: ETT inserted through vocal cords under direct vision,  positive ETCO2 and breath sounds checked- equal and bilateral Secured at: 21 cm Tube secured with: Tape Dental Injury: Teeth and Oropharynx as per pre-operative assessment

## 2015-06-20 NOTE — Brief Op Note (Signed)
06/20/2015  9:24 AM  PATIENT:  [name]  49 y.o. female  PRE-OPERATIVE DIAGNOSIS:  left patella fracture, painful hardware  POST-OPERATIVE DIAGNOSIS:  left patella fracture, painful hardware   PROCEDURE:  Procedure(s) with comments: HARDWARE REMOVAL (Left) - left knee  Findings: prominent circlage wire  We removed the prominent portion of the cerclage wire and one of the K wires. We do have backup fixation with the #5 Tycron suture which was weaved around the patella at the time of fixation.  We did the procedure in the following manner Site confirmation in the preop area with site marking. Chart review. Patient taken to surgery. Appropriate antibiotics given. General anesthesia with LMA was without complication. The patient was in supine position. The left leg was prepped and draped sterilely. The leg was exsanguinated with a six-inch Esmarch and tourniquet was inflated. Timeout was executed prior to exsanguination of the limb area x-rays were available.  The same midline incision was used using only the portion where the hardware was prominent. Subcutaneous tissue was divided full-thickness flap was created. Hardware was exposed. We untwisted the cerclage wire amputated it at its base and remove one of the K wires. Flexion extension of the knee showed that the fracture had healed nicely.  The wound was irrigated and closed with 0 Monocryl and 2-0 Monocryl and then skin staples with 30 mL of Marcaine injected.  Sterile dressing applied tourniquet released patient x-ray to taken recovery room stable condition SURGEON:  Surgeon(s) and Role:    * Carole Civil, MD - Primary  PHYSICIAN ASSISTANT:   ASSISTANTS: none   ANESTHESIA:   general  EBL:  Total I/O In: 600 [I.V.:600] Out: 0   BLOOD ADMINISTERED:none  DRAINS: none   LOCAL MEDICATIONS USED:  MARCAINE     SPECIMEN:  No Specimen  DISPOSITION OF SPECIMEN:  N/A  COUNTS:  YES  TOURNIQUET:   Total Tourniquet  Time Documented: Thigh (Left) - 18 minutes Total: Thigh (Left) - 18 minutes   DICTATION: .Viviann Spare Dictation  PLAN OF CARE: Discharge to home after PACU  PATIENT DISPOSITION:  PACU - hemodynamically stable.   Delay start of Pharmacological VTE agent (>24hrs) due to surgical blood loss or risk of bleeding: not applicable

## 2015-06-20 NOTE — Transfer of Care (Signed)
Immediate Anesthesia Transfer of Care Note  Patient: [name]  Procedure(s) Performed: Procedure(s) with comments: HARDWARE REMOVAL (Left) - left knee  Patient Location: PACU  Anesthesia Type:General  Level of Consciousness: awake, alert  and oriented  Airway & Oxygen Therapy: Patient Spontanous Breathing and Patient connected to face mask oxygen  Post-op Assessment: Report given to RN  Post vital signs: Reviewed and stable  Last Vitals:  Filed Vitals:   06/20/15 0750 06/20/15 0755  BP: 114/73 112/79  Pulse:    Temp:    Resp: 15 18    Complications: No apparent anesthesia complications

## 2015-06-20 NOTE — Anesthesia Postprocedure Evaluation (Signed)
Anesthesia Post Note  Patient: [name]  Procedure(s) Performed: Procedure(s) (LRB): HARDWARE REMOVAL (Left)  Patient location during evaluation: Short Stay Anesthesia Type: General Level of consciousness: awake and alert and oriented Pain management: pain level controlled Vital Signs Assessment: post-procedure vital signs reviewed and stable Respiratory status: spontaneous breathing Cardiovascular status: blood pressure returned to baseline Anesthetic complications: no    Last Vitals:  Filed Vitals:   06/20/15 0945 06/20/15 0956  BP:  135/85  Pulse: 77 77  Temp:  36.5 C  Resp: 14 18    Last Pain:  Filed Vitals:   06/20/15 0957  PainSc: 0-No pain                 April Colter

## 2015-06-20 NOTE — Anesthesia Preprocedure Evaluation (Signed)
Anesthesia Evaluation  Patient identified by MRN, date of birth, ID band Patient awake    Reviewed: Allergy & Precautions, NPO status , Patient's Chart, lab work & pertinent test results  Airway Mallampati: I  TM Distance: >3 FB     Dental  (+) Poor Dentition, Partial Upper, Missing, Dental Advisory Given   Pulmonary Current Smoker,  breath sounds clear to auscultation        Cardiovascular hypertension, Rhythm:Regular Rate:Normal     Neuro/Psych PSYCHIATRIC DISORDERS Anxiety    GI/Hepatic negative GI ROS,   Endo/Other    Renal/GU      Musculoskeletal   Abdominal   Peds  Hematology   Anesthesia Other Findings   Reproductive/Obstetrics                            Anesthesia Physical Anesthesia Plan  ASA: II  Anesthesia Plan: General   Post-op Pain Management:    Induction: Intravenous  Airway Management Planned: LMA  Additional Equipment:   Intra-op Plan:   Post-operative Plan: Extubation in OR  Informed Consent: I have reviewed the patients History and Physical, chart, labs and discussed the procedure including the risks, benefits and alternatives for the proposed anesthesia with the patient or authorized representative who has indicated his/her understanding and acceptance.     Plan Discussed with:   Anesthesia Plan Comments:         Anesthesia Quick Evaluation  

## 2015-06-20 NOTE — Op Note (Signed)
06/20/2015  9:24 AM  PATIENT:  [name]  50 y.o. female  PRE-OPERATIVE DIAGNOSIS:  left patella fracture, painful hardware  POST-OPERATIVE DIAGNOSIS:  left patella fracture, painful hardware   PROCEDURE:  Procedure(s) with comments: HARDWARE REMOVAL (Left) - left knee  Findings: prominent circlage wire  We removed the prominent portion of the cerclage wire and one of the K wires. We do have backup fixation with the #5 Tycron suture which was weaved around the patella at the time of fixation.  We did the procedure in the following manner Site confirmation in the preop area with site marking. Chart review. Patient taken to surgery. Appropriate antibiotics given. General anesthesia with LMA was without complication. The patient was in supine position. The left leg was prepped and draped sterilely. The leg was exsanguinated with a six-inch Esmarch and tourniquet was inflated. Timeout was executed prior to exsanguination of the limb area x-rays were available.  The same midline incision was used using only the portion where the hardware was prominent. Subcutaneous tissue was divided full-thickness flap was created. Hardware was exposed. We untwisted the cerclage wire amputated it at its base and remove one of the K wires. Flexion extension of the knee showed that the fracture had healed nicely.  The wound was irrigated and closed with 0 Monocryl and 2-0 Monocryl and then skin staples with 30 mL of Marcaine injected.  Sterile dressing applied tourniquet released patient x-ray to taken recovery room stable condition SURGEON:  Surgeon(s) and Role:    * Carole Civil, MD - Primary  PHYSICIAN ASSISTANT:   ASSISTANTS: none   ANESTHESIA:   general  EBL:  Total I/O In: 600 [I.V.:600] Out: 0   BLOOD ADMINISTERED:none  DRAINS: none   LOCAL MEDICATIONS USED:  MARCAINE     SPECIMEN:  No Specimen  DISPOSITION OF SPECIMEN:  N/A  COUNTS:  YES  TOURNIQUET:   Total Tourniquet  Time Documented: Thigh (Left) - 18 minutes Total: Thigh (Left) - 18 minutes   DICTATION: .Viviann Spare Dictation  PLAN OF CARE: Discharge to home after PACU  PATIENT DISPOSITION:  PACU - hemodynamically stable.   Delay start of Pharmacological VTE agent (>24hrs) due to surgical blood loss or risk of bleeding: not applicable

## 2015-06-21 ENCOUNTER — Encounter (HOSPITAL_COMMUNITY): Payer: Self-pay | Admitting: Orthopedic Surgery

## 2015-06-26 ENCOUNTER — Ambulatory Visit (INDEPENDENT_AMBULATORY_CARE_PROVIDER_SITE_OTHER): Payer: Self-pay | Admitting: Orthopedic Surgery

## 2015-06-26 VITALS — BP 137/71 | Ht 65.0 in | Wt 131.0 lb

## 2015-06-26 DIAGNOSIS — Z4789 Encounter for other orthopedic aftercare: Secondary | ICD-10-CM

## 2015-06-26 DIAGNOSIS — S82002S Unspecified fracture of left patella, sequela: Secondary | ICD-10-CM

## 2015-06-26 LAB — HM MAMMOGRAPHY: HM MAMMO: NEGATIVE

## 2015-06-26 NOTE — Progress Notes (Signed)
Postop visit #1 status post hardware removal left knee History of open treatment internal fixation left patella fracture  BP 137/71 mmHg  Ht 5\' 5"  (1.651 m)  Wt 131 lb (59.421 kg)  BMI 21.80 kg/m2  The knee incision is clean dry and intact with no signs of infection. She had some swelling in the foot but a normal soft And a negative Homans sign  Patient is weightbearing as tolerated she can follow-up in a week to have her staples removed

## 2015-06-29 ENCOUNTER — Encounter: Payer: Self-pay | Admitting: *Deleted

## 2015-07-02 ENCOUNTER — Other Ambulatory Visit: Payer: Self-pay | Admitting: Family

## 2015-07-03 ENCOUNTER — Ambulatory Visit: Payer: Self-pay | Admitting: Orthopedic Surgery

## 2015-07-03 ENCOUNTER — Ambulatory Visit (INDEPENDENT_AMBULATORY_CARE_PROVIDER_SITE_OTHER): Payer: Medicaid Other | Admitting: Orthopedic Surgery

## 2015-07-03 VITALS — BP 139/88 | Ht 65.0 in | Wt 131.0 lb

## 2015-07-03 DIAGNOSIS — S82002S Unspecified fracture of left patella, sequela: Secondary | ICD-10-CM

## 2015-07-03 MED ORDER — HYDROMORPHONE HCL 2 MG PO TABS: 2.0000 mg | ORAL_TABLET | Freq: Four times a day (QID) | ORAL | Status: DC | PRN

## 2015-07-03 NOTE — Progress Notes (Signed)
Patient ID: [name], female   DOB: April 14, 1966, 50 y.o.   MRN: [id number]  Follow up visit  Chief Complaint  Patient presents with  . Follow-up    post op 2, staple removal, Rt knee hardware removal, DOS 06/20/15    BP 139/88 mmHg  Ht 5\' 5"  (1.651 m)  Wt 131 lb (59.421 kg)  BMI 21.80 kg/m2  Encounter Diagnosis  Name Primary?  . Patella fracture, left, sequela Yes    Staples are removed wound is clean  Motion is normal no swelling  Released  (c/o soreness left knee - I refilled dilaudid # 30 )

## 2015-08-07 ENCOUNTER — Other Ambulatory Visit: Payer: Self-pay | Admitting: Orthopedic Surgery

## 2015-08-07 ENCOUNTER — Other Ambulatory Visit: Payer: Self-pay | Admitting: Family

## 2015-10-04 ENCOUNTER — Encounter: Payer: Self-pay | Admitting: Family Medicine

## 2015-10-04 ENCOUNTER — Ambulatory Visit (INDEPENDENT_AMBULATORY_CARE_PROVIDER_SITE_OTHER): Payer: Medicaid Other | Admitting: Family Medicine

## 2015-10-04 ENCOUNTER — Encounter (INDEPENDENT_AMBULATORY_CARE_PROVIDER_SITE_OTHER): Payer: Self-pay

## 2015-10-04 VITALS — BP 131/88 | HR 86 | Temp 97.8°F | Ht 65.0 in | Wt 131.5 lb

## 2015-10-04 DIAGNOSIS — H9211 Otorrhea, right ear: Secondary | ICD-10-CM

## 2015-10-04 MED ORDER — SIMVASTATIN 20 MG PO TABS: 20.0000 mg | ORAL_TABLET | Freq: Every day | ORAL | Status: DC

## 2015-10-04 NOTE — Progress Notes (Signed)
   HPI  Patient presents today here with drainage and pain of the right ear.  Patient's when she's had symptoms for about 5 days now, she's used L in the ear to see she can help it drain, draining clear brown fluid. She has some persistent ear pain as well. No fever, chills, sweats.  She does have congestion consistent with seasonal allergies.  She is breathing normally  PMH: Smoking status noted ROS: Per HPI  Objective: BP 131/88 mmHg  Pulse 86  Temp(Src) 97.8 F (36.6 C) (Oral)  Ht 5\' 5"  (1.651 m)  Wt 131 lb 8 oz (59.648 kg)  BMI 21.88 kg/m2 Gen: NAD, alert, cooperative with exam HEENT: NCAT, left TM with loss of landmarks, some patchy erythema on the left TM, right TM obscured by cerumen but loss of landmarks as well, heavy cerumen After right-sided ear washout: - Mild erythema and maceration of TM, No clear signs of AOM CV: RRR, good S1/S2, no murmur Resp: CTABL, no wheezes, non-labored Ext: No edema, warm Neuro: Alert and oriented, No gross deficits  Assessment and plan:  # Ear drainage, impacted cerumen Complete improvement after washout, discussed reasons to return No clear need for antibiotics.  WOuld have low threshold for treating with unusual appearence of BL TM but without ear pain I think it is not clear that there is any AOM.     Meds ordered this encounter  Medications  . simvastatin (ZOCOR) 20 MG tablet    Sig: Take 1 tablet (20 mg total) by mouth at bedtime.    Dispense:  90 tablet    Refill:  [place], MD [facility] 10/04/2015, 3:18 PM

## 2015-10-16 ENCOUNTER — Encounter (INDEPENDENT_AMBULATORY_CARE_PROVIDER_SITE_OTHER): Payer: Self-pay

## 2015-10-16 ENCOUNTER — Encounter: Payer: Self-pay | Admitting: Family

## 2015-10-16 ENCOUNTER — Ambulatory Visit (INDEPENDENT_AMBULATORY_CARE_PROVIDER_SITE_OTHER): Payer: Medicaid Other | Admitting: Family

## 2015-10-16 VITALS — BP 140/81 | HR 85 | Temp 97.5°F | Ht 65.0 in | Wt 130.9 lb

## 2015-10-16 DIAGNOSIS — Z1211 Encounter for screening for malignant neoplasm of colon: Secondary | ICD-10-CM

## 2015-10-16 DIAGNOSIS — I10 Essential (primary) hypertension: Secondary | ICD-10-CM

## 2015-10-16 DIAGNOSIS — F411 Generalized anxiety disorder: Secondary | ICD-10-CM

## 2015-10-16 DIAGNOSIS — K5900 Constipation, unspecified: Secondary | ICD-10-CM | POA: Insufficient documentation

## 2015-10-16 DIAGNOSIS — E785 Hyperlipidemia, unspecified: Secondary | ICD-10-CM

## 2015-10-16 DIAGNOSIS — H60391 Other infective otitis externa, right ear: Secondary | ICD-10-CM | POA: Diagnosis not present

## 2015-10-16 DIAGNOSIS — D509 Iron deficiency anemia, unspecified: Secondary | ICD-10-CM | POA: Diagnosis not present

## 2015-10-16 MED ORDER — AMOXICILLIN 875 MG PO TABS: 875.0000 mg | ORAL_TABLET | Freq: Two times a day (BID) | ORAL | Status: DC

## 2015-10-16 NOTE — Progress Notes (Signed)
Subjective:    Patient ID: [name], female    DOB: [birth year], 50 y.o.   MRN: [id number]  Pt presents to the office today for chronic follow up. General muscle pain present and was present prior to starting the statin. She was seen 8 days ago for right ear pain in office. The ear was irrigated and her pain lessened.  No medications were prescribed at that time.  Today she is complaining of a recurrence of this pain. She has had no drainage, fever or hearing loss.    Hyperlipidemia This is a chronic problem. The current episode started more than 1 year ago. The problem is controlled. Recent lipid tests were reviewed and are normal. She has no history of diabetes, hypothyroidism or obesity. Pertinent negatives include no chest pain, leg pain, myalgias or shortness of breath. Current antihyperlipidemic treatment includes statins. The current treatment provides significant improvement of lipids. There are no compliance problems.  Risk factors for coronary artery disease include dyslipidemia, family history, hypertension and a sedentary lifestyle.  Hypertension This is a chronic problem. The current episode started more than 1 year ago. The problem has been resolved since onset. The problem is controlled. Associated symptoms include anxiety. Pertinent negatives include no chest pain, headaches, malaise/fatigue, palpitations, peripheral edema or shortness of breath. Risk factors for coronary artery disease include dyslipidemia and family history. Past treatments include nothing. The current treatment provides no improvement. Compliance problems include exercise.  There is no history of kidney disease, CAD/MI, CVA, heart failure or a thyroid problem. There is no history of sleep apnea.  Anxiety Presents for follow-up visit. Onset was at an unknown time. The problem has been gradually improving. Patient reports no chest pain, compulsions, confusion, decreased concentration, depressed mood, excessive  worry, hyperventilation, insomnia, irritability, nausea, nervous/anxious behavior, palpitations, panic or shortness of breath. Symptoms occur rarely. The severity of symptoms is moderate. The quality of sleep is good. Nighttime awakenings: none.   Her past medical history is significant for anemia and anxiety/panic attacks. There is no history of CAD or depression. Past treatments include SSRIs. Compliance with prior treatments has been good.  Otalgia  There is pain in the right ear. This is a recurrent problem. The current episode started 1 to 4 weeks ago. The problem occurs every few hours. The problem has been gradually improving. There has been no fever. The pain is mild. Pertinent negatives include no coughing, diarrhea, ear discharge, headaches, hearing loss, rhinorrhea or sore throat. She has tried NSAIDs and acetaminophen for the symptoms. The treatment provided significant relief.  Constipation This is a chronic problem. The current episode started more than 1 year ago. The problem has been resolved since onset. Her stool frequency is 4 to 5 times per week. The patient is on a high fiber diet. She exercises regularly. There has been adequate water intake. Pertinent negatives include no diarrhea, flatus or nausea. Risk factors include dietary change. She has tried diet changes and stool softeners for the symptoms. The treatment provided moderate relief.  Anemia Presents for follow-up visit. Symptoms include light-headedness ("at times"). There has been no bruising/bleeding easily, confusion, malaise/fatigue or palpitations. Past treatments include oral iron supplements. There is no history of heart failure or hypothyroidism. Family history includes iron deficiency.      Review of Systems  Constitutional: Negative.  Negative for malaise/fatigue, appetite change and irritability.  HENT: Negative.  Negative for ear discharge, ear pain, hearing loss, rhinorrhea and sore throat.   Eyes: Negative.  Respiratory: Negative.  Negative for cough and shortness of breath.   Cardiovascular: Negative for chest pain and palpitations.  Gastrointestinal: Positive for constipation. Negative for nausea, diarrhea and flatus.  Endocrine: Negative.   Genitourinary: Negative.   Musculoskeletal: Positive for arthralgias. Negative for myalgias.       Recent surgery to left knee.  Neurological: Positive for light-headedness ("at times"). Negative for headaches.  Hematological: Negative.  Does not bruise/bleed easily.  Psychiatric/Behavioral: Negative.  Negative for confusion and decreased concentration. The patient is not nervous/anxious and does not have insomnia.   All other systems reviewed and are negative.      Objective:   Physical Exam  Constitutional: She is oriented to person, place, and time. Vital signs are normal. She appears well-developed and well-nourished. No distress.  HENT:  Head: Normocephalic and atraumatic.  Right Ear: There is tenderness. No drainage. Tympanic membrane is injected and erythematous. A middle ear effusion is present.  Left Ear: Tympanic membrane and external ear normal. No drainage or swelling.  No middle ear effusion.  Nose: Nose normal.  Mouth/Throat: Oropharynx is clear and moist.  Eyes: Conjunctivae and EOM are normal. Pupils are equal, round, and reactive to light.  Neck: Normal range of motion. Neck supple. Carotid bruit is not present. No thyroid mass and no thyromegaly present.  Cardiovascular: Normal rate, regular rhythm, normal heart sounds, intact distal pulses and normal pulses.   No murmur heard. Pulmonary/Chest: Effort normal and breath sounds normal. No respiratory distress. She has no wheezes.  Abdominal: Soft. Bowel sounds are normal. She exhibits no distension. There is no tenderness.  Musculoskeletal: She exhibits no edema.       Left knee: She exhibits decreased range of motion, swelling and bony tenderness. Tenderness found.    Lymphadenopathy:       Head (right side): No submental adenopathy present.       Head (left side): No submental adenopathy present.    She has no cervical adenopathy.  Neurological: She is alert and oriented to person, place, and time. She has normal strength and normal reflexes. No cranial nerve deficit or sensory deficit.  Skin: Skin is warm and dry. No rash noted.  Psychiatric: She has a normal mood and affect. Her speech is normal and behavior is normal. Judgment and thought content normal. Her mood appears not anxious. Her affect is not inappropriate. She does not exhibit a depressed mood.  Vitals reviewed.     BP 140/81 mmHg  Pulse 85  Temp(Src) 97.5 F (36.4 C) (Oral)  Ht _0  (1.651 m)  Wt 130 lb 14.4 oz (59.376 kg)  BMI 21.78 kg/m2     Assessment & Plan:  1. GAD (generalized anxiety disorder) - CMP14+EGFR  2. Hyperlipidemia - CMP14+EGFR - Lipid panel  3. Essential hypertension, benign - CMP14+EGFR  4. Anemia, iron deficiency - CMP14+EGFR - Anemia Profile B  5. Constipation, unspecified constipation type  - CMP14+EGFR  6. Colon cancer screening - CMP14+EGFR - Ambulatory referral to Gastroenterology   7. Otitis, externa, infective, right -Keep ears clean and dry -Tylenol prn for pain - amoxicillin (AMOXIL) 875 MG tablet; Take 1 tablet (875 mg total) by mouth 2 (two) times daily.  Dispense: 20 tablet; Refill: 0   Continue all meds Labs pending Health Maintenance reviewed Diet and exercise encouraged RTO 6 months  Evelina Dun, FNP

## 2015-10-16 NOTE — Patient Instructions (Signed)
Health Maintenance, Female Adopting a healthy lifestyle and getting preventive care can go a long way to promote health and wellness. Talk with your health care provider about what schedule of regular examinations is right for you. This is a good chance for you to check in with your provider about disease prevention and staying healthy. In between checkups, there are plenty of things you can do on your own. Experts have done a lot of research about which lifestyle changes and preventive measures are most likely to keep you healthy. Ask your health care provider for more information. WEIGHT AND DIET  Eat a healthy diet  Be sure to include plenty of vegetables, fruits, low-fat dairy products, and lean protein.  Do not eat a lot of foods high in solid fats, added sugars, or salt.  Get regular exercise. This is one of the most important things you can do for your health.  Most adults should exercise for at least 150 minutes each week. The exercise should increase your heart rate and make you sweat (moderate-intensity exercise).  Most adults should also do strengthening exercises at least twice a week. This is in addition to the moderate-intensity exercise.  Maintain a healthy weight  Body mass index (BMI) is a measurement that can be used to identify possible weight problems. It estimates body fat based on height and weight. Your health care provider can help determine your BMI and help you achieve or maintain a healthy weight.  For females 20 years of age and older:   A BMI below 18.5 is considered underweight.  A BMI of 18.5 to 24.9 is normal.  A BMI of 25 to 29.9 is considered overweight.  A BMI of 30 and above is considered obese.  Watch levels of cholesterol and blood lipids  You should start having your blood tested for lipids and cholesterol at 51 years of age, then have this test every 5 years.  You may need to have your cholesterol levels checked more often if:  Your lipid  or cholesterol levels are high.  You are older than 50 years of age.  You are at high risk for heart disease.  CANCER SCREENING   Lung Cancer  Lung cancer screening is recommended for adults 55-80 years old who are at high risk for lung cancer because of a history of smoking.  A yearly low-dose CT scan of the lungs is recommended for people who:  Currently smoke.  Have quit within the past 15 years.  Have at least a 30-pack-year history of smoking. A pack year is smoking an average of one pack of cigarettes a day for 1 year.  Yearly screening should continue until it has been 15 years since you quit.  Yearly screening should stop if you develop a health problem that would prevent you from having lung cancer treatment.  Breast Cancer  Practice breast self-awareness. This means understanding how your breasts normally appear and feel.  It also means doing regular breast self-exams. Let your health care provider know about any changes, no matter how small.  If you are in your 20s or 30s, you should have a clinical breast exam (CBE) by a health care provider every 1-3 years as part of a regular health exam.  If you are 40 or older, have a CBE every year. Also consider having a breast X-ray (mammogram) every year.  If you have a family history of breast cancer, talk to your health care provider about genetic screening.  If you   are at high risk for breast cancer, talk to your health care provider about having an MRI and a mammogram every year.  Breast cancer gene (BRCA) assessment is recommended for women who have family members with BRCA-related cancers. BRCA-related cancers include:  Breast.  Ovarian.  Tubal.  Peritoneal cancers.  Results of the assessment will determine the need for genetic counseling and BRCA1 and BRCA2 testing. Cervical Cancer Your health care provider may recommend that you be screened regularly for cancer of the pelvic organs (ovaries, uterus, and  vagina). This screening involves a pelvic examination, including checking for microscopic changes to the surface of your cervix (Pap test). You may be encouraged to have this screening done every 3 years, beginning at age 21.  For women ages 30-65, health care providers may recommend pelvic exams and Pap testing every 3 years, or they may recommend the Pap and pelvic exam, combined with testing for human papilloma virus (HPV), every 5 years. Some types of HPV increase your risk of cervical cancer. Testing for HPV may also be done on women of any age with unclear Pap test results.  Other health care providers may not recommend any screening for nonpregnant women who are considered low risk for pelvic cancer and who do not have symptoms. Ask your health care provider if a screening pelvic exam is right for you.  If you have had past treatment for cervical cancer or a condition that could lead to cancer, you need Pap tests and screening for cancer for at least 20 years after your treatment. If Pap tests have been discontinued, your risk factors (such as having a new sexual partner) need to be reassessed to determine if screening should resume. Some women have medical problems that increase the chance of getting cervical cancer. In these cases, your health care provider may recommend more frequent screening and Pap tests. Colorectal Cancer  This type of cancer can be detected and often prevented.  Routine colorectal cancer screening usually begins at 50 years of age and continues through 51 years of age.  Your health care provider may recommend screening at an earlier age if you have risk factors for colon cancer.  Your health care provider may also recommend using home test kits to check for hidden blood in the stool.  A small camera at the end of a tube can be used to examine your colon directly (sigmoidoscopy or colonoscopy). This is done to check for the earliest forms of colorectal  cancer.  Routine screening usually begins at age 50.  Direct examination of the colon should be repeated every 5-10 years through 51 years of age. However, you may need to be screened more often if early forms of precancerous polyps or small growths are found. Skin Cancer  Check your skin from head to toe regularly.  Tell your health care provider about any new moles or changes in moles, especially if there is a change in a mole's shape or color.  Also tell your health care provider if you have a mole that is larger than the size of a pencil eraser.  Always use sunscreen. Apply sunscreen liberally and repeatedly throughout the day.  Protect yourself by wearing long sleeves, pants, a wide-brimmed hat, and sunglasses whenever you are outside. HEART DISEASE, DIABETES, AND HIGH BLOOD PRESSURE   High blood pressure causes heart disease and increases the risk of stroke. High blood pressure is more likely to develop in:  People who have blood pressure in the high end   of the normal range (130-139/85-89 mm Hg).  People who are overweight or obese.  People who are African American.  If you are 38-23 years of age, have your blood pressure checked every 3-5 years. If you are 61 years of age or older, have your blood pressure checked every year. You should have your blood pressure measured twice--once when you are at a hospital or clinic, and once when you are not at a hospital or clinic. Record the average of the two measurements. To check your blood pressure when you are not at a hospital or clinic, you can use:  An automated blood pressure machine at a pharmacy.  A home blood pressure monitor.  If you are between 45 years and 39 years old, ask your health care provider if you should take aspirin to prevent strokes.  Have regular diabetes screenings. This involves taking a blood sample to check your fasting blood sugar level.  If you are at a normal weight and have a low risk for diabetes,  have this test once every three years after 51 years of age.  If you are overweight and have a high risk for diabetes, consider being tested at a younger age or more often. PREVENTING INFECTION  Hepatitis B  If you have a higher risk for hepatitis B, you should be screened for this virus. You are considered at high risk for hepatitis B if:  You were born in a country where hepatitis B is common. Ask your health care provider which countries are considered high risk.  Your parents were born in a high-risk country, and you have not been immunized against hepatitis B (hepatitis B vaccine).  You have HIV or AIDS.  You use needles to inject street drugs.  You live with someone who has hepatitis B.  You have had sex with someone who has hepatitis B.  You get hemodialysis treatment.  You take certain medicines for conditions, including cancer, organ transplantation, and autoimmune conditions. Hepatitis C  Blood testing is recommended for:  Everyone born from [birth year] through 1966.  Anyone with known risk factors for hepatitis C. Sexually transmitted infections (STIs)  You should be screened for sexually transmitted infections (STIs) including gonorrhea and chlamydia if:  You are sexually active and are younger than 51 years of age.  You are older than 51 years of age and your health care provider tells you that you are at risk for this type of infection.  Your sexual activity has changed since you were last screened and you are at an increased risk for chlamydia or gonorrhea. Ask your health care provider if you are at risk.  If you do not have HIV, but are at risk, it may be recommended that you take a prescription medicine daily to prevent HIV infection. This is called pre-exposure prophylaxis (PrEP). You are considered at risk if:  You are sexually active and do not regularly use condoms or know the HIV status of your partner(s).  You take drugs by injection.  You are sexually  active with a partner who has HIV. Talk with your health care provider about whether you are at high risk of being infected with HIV. If you choose to begin PrEP, you should first be tested for HIV. You should then be tested every 3 months for as long as you are taking PrEP.  PREGNANCY   If you are premenopausal and you may become pregnant, ask your health care provider about preconception counseling.  If you may  become pregnant, take 400 to 800 micrograms (mcg) of folic acid every day.  If you want to prevent pregnancy, talk to your health care provider about birth control (contraception). OSTEOPOROSIS AND MENOPAUSE   Osteoporosis is a disease in which the bones lose minerals and strength with aging. This can result in serious bone fractures. Your risk for osteoporosis can be identified using a bone density scan.  If you are 61 years of age or older, or if you are at risk for osteoporosis and fractures, ask your health care provider if you should be screened.  Ask your health care provider whether you should take a calcium or vitamin D supplement to lower your risk for osteoporosis.  Menopause may have certain physical symptoms and risks.  Hormone replacement therapy may reduce some of these symptoms and risks. Talk to your health care provider about whether hormone replacement therapy is right for you.  HOME CARE INSTRUCTIONS   Schedule regular health, dental, and eye exams.  Stay current with your immunizations.   Do not use any tobacco products including cigarettes, chewing tobacco, or electronic cigarettes.  If you are pregnant, do not drink alcohol.  If you are breastfeeding, limit how much and how often you drink alcohol.  Limit alcohol intake to no more than 1 drink per day for nonpregnant women. One drink equals 12 ounces of beer, 5 ounces of wine, or 1 ounces of hard liquor.  Do not use street drugs.  Do not share needles.  Ask your health care provider for help if  you need support or information about quitting drugs.  Tell your health care provider if you often feel depressed.  Tell your health care provider if you have ever been abused or do not feel safe at home.   This information is not intended to replace advice given to you by your health care provider. Make sure you discuss any questions you have with your health care provider.   Document Released: 12/23/2010 Document Revised: 06/30/2014 Document Reviewed: 05/11/2013 Elsevier Interactive Patient Education [employer].

## 2015-10-17 ENCOUNTER — Encounter: Payer: Self-pay | Admitting: Gastroenterology

## 2015-10-17 LAB — CMP14+EGFR
ALBUMIN: 4.3 g/dL (ref 3.5–5.5)
ALK PHOS: 61 IU/L (ref 39–117)
ALT: 20 IU/L (ref 0–32)
AST: 18 IU/L (ref 0–40)
Albumin/Globulin Ratio: 1.7 (ref 1.2–2.2)
BUN/Creatinine Ratio: 13 (ref 9–23)
BUN: 8 mg/dL (ref 6–24)
Bilirubin Total: 0.2 mg/dL (ref 0.0–1.2)
CALCIUM: 9.2 mg/dL (ref 8.7–10.2)
CHLORIDE: 98 mmol/L (ref 96–106)
CO2: 27 mmol/L (ref 18–29)
CREATININE: 0.61 mg/dL (ref 0.57–1.00)
GFR calc Af Amer: 122 mL/min/{1.73_m2} (ref >59)
GFR, EST NON AFRICAN AMERICAN: 106 mL/min/{1.73_m2} (ref >59)
GLOBULIN, TOTAL: 2.5 g/dL (ref 1.5–4.5)
GLUCOSE: 79 mg/dL (ref 65–99)
POTASSIUM: 4.1 mmol/L (ref 3.5–5.2)
SODIUM: 139 mmol/L (ref 134–144)
TOTAL PROTEIN: 6.8 g/dL (ref 6.0–8.5)

## 2015-10-17 LAB — ANEMIA PROFILE B
BASOS ABS: 0 10*3/uL (ref 0.0–0.2)
BASOS: 0 %
EOS (ABSOLUTE): 0.1 10*3/uL (ref 0.0–0.4)
Eos: 1 %
FERRITIN: 588 ng/mL — AB (ref 15–150)
FOLATE: 8.9 ng/mL (ref >3.0)
HEMATOCRIT: 39.7 % (ref 34.0–46.6)
Hemoglobin: 13.2 g/dL (ref 11.1–15.9)
IRON: 57 ug/dL (ref 27–159)
Immature Grans (Abs): 0 10*3/uL (ref 0.0–0.1)
Immature Granulocytes: 0 %
Iron Saturation: 25 % (ref 15–55)
LYMPHS ABS: 2.5 10*3/uL (ref 0.7–3.1)
Lymphs: 32 %
MCH: 31.5 pg (ref 26.6–33.0)
MCHC: 33.2 g/dL (ref 31.5–35.7)
MCV: 95 fL (ref 79–97)
Monocytes Absolute: 1 10*3/uL — ABNORMAL HIGH (ref 0.1–0.9)
Monocytes: 13 %
NEUTROS ABS: 4.2 10*3/uL (ref 1.4–7.0)
Neutrophils: 54 %
PLATELETS: 320 10*3/uL (ref 150–379)
RBC: 4.19 x10E6/uL (ref 3.77–5.28)
RDW: 12.9 % (ref 12.3–15.4)
RETIC CT PCT: 1.6 % (ref 0.6–2.6)
Total Iron Binding Capacity: 232 ug/dL — ABNORMAL LOW (ref 250–450)
UIBC: 175 ug/dL (ref 131–425)
VITAMIN B 12: 396 pg/mL (ref 211–946)
WBC: 7.8 10*3/uL (ref 3.4–10.8)

## 2015-10-17 LAB — LIPID PANEL
Chol/HDL Ratio: 2.2 ratio units (ref 0.0–4.4)
Cholesterol, Total: 157 mg/dL (ref 100–199)
HDL: 70 mg/dL (ref >39)
LDL Calculated: 67 mg/dL (ref 0–99)
TRIGLYCERIDES: 100 mg/dL (ref 0–149)
VLDL Cholesterol Cal: 20 mg/dL (ref 5–40)

## 2015-11-02 ENCOUNTER — Other Ambulatory Visit: Payer: Self-pay | Admitting: Family

## 2015-11-15 ENCOUNTER — Encounter: Payer: Self-pay | Admitting: Family

## 2015-11-15 ENCOUNTER — Ambulatory Visit (INDEPENDENT_AMBULATORY_CARE_PROVIDER_SITE_OTHER): Payer: Medicaid Other | Admitting: Family

## 2015-11-15 VITALS — BP 129/81 | HR 75 | Temp 97.6°F | Ht 65.0 in | Wt 134.8 lb

## 2015-11-15 DIAGNOSIS — M5442 Lumbago with sciatica, left side: Secondary | ICD-10-CM | POA: Diagnosis not present

## 2015-11-15 MED ORDER — METHYLPREDNISOLONE ACETATE 80 MG/ML IJ SUSP
80.0000 mg | Freq: Once | INTRAMUSCULAR | Status: AC
  Administered 2015-11-15: 80 mg via INTRAMUSCULAR

## 2015-11-15 MED ORDER — NAPROXEN 500 MG PO TABS: 500.0000 mg | ORAL_TABLET | Freq: Two times a day (BID) | ORAL | Status: DC

## 2015-11-15 MED ORDER — KETOROLAC TROMETHAMINE 60 MG/2ML IM SOLN
60.0000 mg | Freq: Once | INTRAMUSCULAR | Status: AC
  Administered 2015-11-15: 60 mg via INTRAMUSCULAR

## 2015-11-15 MED ORDER — CYCLOBENZAPRINE HCL 5 MG PO TABS: 5.0000 mg | ORAL_TABLET | Freq: Three times a day (TID) | ORAL | Status: DC | PRN

## 2015-11-15 NOTE — Progress Notes (Signed)
   Subjective:    Patient ID: [name], female    DOB: Dec 18, 1965, 50 y.o.   MRN: [id number]  Back Pain This is a new problem. The current episode started in the past 7 days. The problem occurs constantly. The problem is unchanged. The pain is present in the thoracic spine and lumbar spine. The quality of the pain is described as aching. The pain radiates to the left thigh. The pain is at a severity of 10/10. The pain is moderate. The symptoms are aggravated by bending, standing and twisting. Associated symptoms include leg pain, numbness, tingling and weakness. Pertinent negatives include no bladder incontinence, bowel incontinence or dysuria. She has tried bed rest, heat and NSAIDs for the symptoms. The treatment provided mild relief.      Review of Systems  Gastrointestinal: Negative for bowel incontinence.  Genitourinary: Negative for bladder incontinence and dysuria.  Musculoskeletal: Positive for back pain.  Neurological: Positive for tingling, weakness and numbness.  All other systems reviewed and are negative.      Objective:   Physical Exam  Constitutional: She is oriented to person, place, and time. She appears well-developed and well-nourished. No distress.  HENT:  Head: Normocephalic.  Eyes: Pupils are equal, round, and reactive to light.  Neck: Normal range of motion. Neck supple. No thyromegaly present.  Cardiovascular: Normal rate, regular rhythm, normal heart sounds and intact distal pulses.   No murmur heard. Pulmonary/Chest: Effort normal. No respiratory distress. She has wheezes.  Abdominal: Soft. Bowel sounds are normal. She exhibits no distension. There is no tenderness.  Musculoskeletal: Normal range of motion. She exhibits no edema or tenderness.  Neurological: She is alert and oriented to person, place, and time.  Skin: Skin is warm and dry.  Psychiatric: She has a normal mood and affect. Her behavior is normal. Judgment and thought content normal.    Vitals reviewed.    BP 129/81 mmHg  Pulse 75  Temp(Src) 97.6 F (36.4 C) (Oral)  Ht 5\' 5"  (1.651 m)  Wt 134 lb 12.8 oz (61.145 kg)  BMI 22.43 kg/m2      Assessment & Plan:  1. Left-sided low back pain with left-sided sciatica --Rest -Ice as needed -ROM exercises discussed -Sedation precaution discussed -RTO prn  - naproxen (NAPROSYN) 500 MG tablet; Take 1 tablet (500 mg total) by mouth 2 (two) times daily with a meal.  Dispense: 60 tablet; Refill: 1 - cyclobenzaprine (FLEXERIL) 5 MG tablet; Take 1 tablet (5 mg total) by mouth 3 (three) times daily as needed for muscle spasms.  Dispense: 30 tablet; Refill: 0 - methylPREDNISolone acetate (DEPO-MEDROL) injection 80 mg; Inject 1 mL (80 mg total) into the muscle once. - ketorolac (TORADOL) injection 60 mg; Inject 2 mLs (60 mg total) into the muscle once.  Evelina Dun, FNP

## 2015-11-15 NOTE — Patient Instructions (Signed)

## 2015-12-05 ENCOUNTER — Ambulatory Visit (AMBULATORY_SURGERY_CENTER): Payer: Self-pay

## 2015-12-05 VITALS — Ht 66.5 in | Wt 135.2 lb

## 2015-12-05 DIAGNOSIS — Z1211 Encounter for screening for malignant neoplasm of colon: Secondary | ICD-10-CM

## 2015-12-05 MED ORDER — SUPREP BOWEL PREP KIT 17.5-3.13-1.6 GM/177ML PO SOLN: 1.0000 | Freq: Once | ORAL | Status: DC

## 2015-12-05 NOTE — Progress Notes (Signed)
No allergies to eggs or soy No home oxygen No past problems with anesthesia No diet meds  No internet

## 2015-12-11 ENCOUNTER — Ambulatory Visit (INDEPENDENT_AMBULATORY_CARE_PROVIDER_SITE_OTHER): Payer: Medicaid Other

## 2015-12-11 ENCOUNTER — Ambulatory Visit (INDEPENDENT_AMBULATORY_CARE_PROVIDER_SITE_OTHER): Payer: Medicaid Other | Admitting: Family

## 2015-12-11 ENCOUNTER — Encounter: Payer: Self-pay | Admitting: Family

## 2015-12-11 VITALS — BP 128/88 | HR 80 | Temp 98.1°F | Ht 66.5 in | Wt 131.4 lb

## 2015-12-11 DIAGNOSIS — M5441 Lumbago with sciatica, right side: Secondary | ICD-10-CM

## 2015-12-11 MED ORDER — KETOROLAC TROMETHAMINE 60 MG/2ML IM SOLN
60.0000 mg | Freq: Once | INTRAMUSCULAR | Status: AC
  Administered 2015-12-11: 60 mg via INTRAMUSCULAR

## 2015-12-11 MED ORDER — CYCLOBENZAPRINE HCL 5 MG PO TABS: 5.0000 mg | ORAL_TABLET | Freq: Three times a day (TID) | ORAL | Status: DC | PRN

## 2015-12-11 MED ORDER — TRAMADOL HCL 50 MG PO TABS: 50.0000 mg | ORAL_TABLET | Freq: Four times a day (QID) | ORAL | Status: DC | PRN

## 2015-12-11 MED ORDER — METHYLPREDNISOLONE ACETATE 80 MG/ML IJ SUSP
80.0000 mg | Freq: Once | INTRAMUSCULAR | Status: AC
  Administered 2015-12-11: 80 mg via INTRAMUSCULAR

## 2015-12-11 MED ORDER — NAPROXEN 500 MG PO TABS: 500.0000 mg | ORAL_TABLET | Freq: Two times a day (BID) | ORAL | Status: DC

## 2015-12-11 NOTE — Patient Instructions (Signed)
Sciatica With Rehab The sciatic nerve runs from the back down the leg and is responsible for sensation and control of the muscles in the back (posterior) side of the thigh, lower leg, and foot. Sciatica is a condition that is characterized by inflammation of this nerve.  SYMPTOMS   Signs of nerve damage, including numbness and/or weakness along the posterior side of the lower extremity.  Pain in the back of the thigh that may also travel down the leg.  Pain that worsens when sitting for long periods of time.  Occasionally, pain in the back or buttock. CAUSES  Inflammation of the sciatic nerve is the cause of sciatica. The inflammation is due to something irritating the nerve. Common sources of irritation include:  Sitting for long periods of time.  Direct trauma to the nerve.  Arthritis of the spine.  Herniated or ruptured disk.  Slipping of the vertebrae (spondylolisthesis).  Pressure from soft tissues, such as muscles or ligament-like tissue (fascia). RISK INCREASES WITH:  Sports that place pressure or stress on the spine (football or weightlifting).  Poor strength and flexibility.  Failure to warm up properly before activity.  Family history of low back pain or disk disorders.  Previous back injury or surgery.  Poor body mechanics, especially when lifting, or poor posture. PREVENTION   Warm up and stretch properly before activity.  Maintain physical fitness:  Strength, flexibility, and endurance.  Cardiovascular fitness.  Learn and use proper technique, especially with posture and lifting. When possible, have coach correct improper technique.  Avoid activities that place stress on the spine. PROGNOSIS If treated properly, then sciatica usually resolves within 6 weeks. However, occasionally surgery is necessary.  RELATED COMPLICATIONS   Permanent nerve damage, including pain, numbness, tingle, or weakness.  Chronic back pain.  Risks of surgery: infection,  bleeding, nerve damage, or damage to surrounding tissues. TREATMENT Treatment initially involves resting from any activities that aggravate your symptoms. The use of ice and medication may help reduce pain and inflammation. The use of strengthening and stretching exercises may help reduce pain with activity. These exercises may be performed at home or with referral to a therapist. A therapist may recommend further treatments, such as transcutaneous electronic nerve stimulation (TENS) or ultrasound. Your caregiver may recommend corticosteroid injections to help reduce inflammation of the sciatic nerve. If symptoms persist despite non-surgical (conservative) treatment, then surgery may be recommended. MEDICATION  If pain medication is necessary, then nonsteroidal anti-inflammatory medications, such as aspirin and ibuprofen, or other minor pain relievers, such as acetaminophen, are often recommended.  Do not take pain medication for 7 days before surgery.  Prescription pain relievers may be given if deemed necessary by your caregiver. Use only as directed and only as much as you need.  Ointments applied to the skin may be helpful.  Corticosteroid injections may be given by your caregiver. These injections should be reserved for the most serious cases, because they may only be given a certain number of times. HEAT AND COLD  Cold treatment (icing) relieves pain and reduces inflammation. Cold treatment should be applied for 10 to 15 minutes every 2 to 3 hours for inflammation and pain and immediately after any activity that aggravates your symptoms. Use ice packs or massage the area with a piece of ice (ice massage).  Heat treatment may be used prior to performing the stretching and strengthening activities prescribed by your caregiver, physical therapist, or athletic trainer. Use a heat pack or soak the injury in warm water.   SEEK MEDICAL CARE IF:  Treatment seems to offer no benefit, or the condition  worsens.  Any medications produce adverse side effects. EXERCISES  RANGE OF MOTION (ROM) AND STRETCHING EXERCISES - Sciatica Most people with sciatic will find that their symptoms worsen with either excessive bending forward (flexion) or arching at the low back (extension). The exercises which will help resolve your symptoms will focus on the opposite motion. Your physician, physical therapist or athletic trainer will help you determine which exercises will be most helpful to resolve your low back pain. Do not complete any exercises without first consulting with your clinician. Discontinue any exercises which worsen your symptoms until you speak to your clinician. If you have pain, numbness or tingling which travels down into your buttocks, leg or foot, the goal of the therapy is for these symptoms to move closer to your back and eventually resolve. Occasionally, these leg symptoms will get better, but your low back pain may worsen; this is typically an indication of progress in your rehabilitation. Be certain to be very alert to any changes in your symptoms and the activities in which you participated in the 24 hours prior to the change. Sharing this information with your clinician will allow him/her to most efficiently treat your condition. These exercises may help you when beginning to rehabilitate your injury. Your symptoms may resolve with or without further involvement from your physician, physical therapist or athletic trainer. While completing these exercises, remember:   Restoring tissue flexibility helps normal motion to return to the joints. This allows healthier, less painful movement and activity.  An effective stretch should be held for at least 30 seconds.  A stretch should never be painful. You should only feel a gentle lengthening or release in the stretched tissue. FLEXION RANGE OF MOTION AND STRETCHING EXERCISES: STRETCH - Flexion, Single Knee to Chest   Lie on a firm bed or floor  with both legs extended in front of you.  Keeping one leg in contact with the floor, bring your opposite knee to your chest. Hold your leg in place by either grabbing behind your thigh or at your knee.  Pull until you feel a gentle stretch in your low back. Hold __________ seconds.  Slowly release your grasp and repeat the exercise with the opposite side. Repeat __________ times. Complete this exercise __________ times per day.  STRETCH - Flexion, Double Knee to Chest  Lie on a firm bed or floor with both legs extended in front of you.  Keeping one leg in contact with the floor, bring your opposite knee to your chest.  Tense your stomach muscles to support your back and then lift your other knee to your chest. Hold your legs in place by either grabbing behind your thighs or at your knees.  Pull both knees toward your chest until you feel a gentle stretch in your low back. Hold __________ seconds.  Tense your stomach muscles and slowly return one leg at a time to the floor. Repeat __________ times. Complete this exercise __________ times per day.  STRETCH - Low Trunk Rotation   Lie on a firm bed or floor. Keeping your legs in front of you, bend your knees so they are both pointed toward the ceiling and your feet are flat on the floor.  Extend your arms out to the side. This will stabilize your upper body by keeping your shoulders in contact with the floor.  Gently and slowly drop both knees together to one side until   you feel a gentle stretch in your low back. Hold for __________ seconds.  Tense your stomach muscles to support your low back as you bring your knees back to the starting position. Repeat the exercise to the other side. Repeat __________ times. Complete this exercise __________ times per day  EXTENSION RANGE OF MOTION AND FLEXIBILITY EXERCISES: STRETCH - Extension, Prone on Elbows  Lie on your stomach on the floor, a bed will be too soft. Place your palms about shoulder  width apart and at the height of your head.  Place your elbows under your shoulders. If this is too painful, stack pillows under your chest.  Allow your body to relax so that your hips drop lower and make contact more completely with the floor.  Hold this position for __________ seconds.  Slowly return to lying flat on the floor. Repeat __________ times. Complete this exercise __________ times per day.  RANGE OF MOTION - Extension, Prone Press Ups  Lie on your stomach on the floor, a bed will be too soft. Place your palms about shoulder width apart and at the height of your head.  Keeping your back as relaxed as possible, slowly straighten your elbows while keeping your hips on the floor. You may adjust the placement of your hands to maximize your comfort. As you gain motion, your hands will come more underneath your shoulders.  Hold this position __________ seconds.  Slowly return to lying flat on the floor. Repeat __________ times. Complete this exercise __________ times per day.  STRENGTHENING EXERCISES - Sciatica  These exercises may help you when beginning to rehabilitate your injury. These exercises should be done near your "sweet spot." This is the neutral, low-back arch, somewhere between fully rounded and fully arched, that is your least painful position. When performed in this safe range of motion, these exercises can be used for people who have either a flexion or extension based injury. These exercises may resolve your symptoms with or without further involvement from your physician, physical therapist or athletic trainer. While completing these exercises, remember:   Muscles can gain both the endurance and the strength needed for everyday activities through controlled exercises.  Complete these exercises as instructed by your physician, physical therapist or athletic trainer. Progress with the resistance and repetition exercises only as your caregiver advises.  You may  experience muscle soreness or fatigue, but the pain or discomfort you are trying to eliminate should never worsen during these exercises. If this pain does worsen, stop and make certain you are following the directions exactly. If the pain is still present after adjustments, discontinue the exercise until you can discuss the trouble with your clinician. STRENGTHENING - Deep Abdominals, Pelvic Tilt   Lie on a firm bed or floor. Keeping your legs in front of you, bend your knees so they are both pointed toward the ceiling and your feet are flat on the floor.  Tense your lower abdominal muscles to press your low back into the floor. This motion will rotate your pelvis so that your tail bone is scooping upwards rather than pointing at your feet or into the floor.  With a gentle tension and even breathing, hold this position for __________ seconds. Repeat __________ times. Complete this exercise __________ times per day.  STRENGTHENING - Abdominals, Crunches   Lie on a firm bed or floor. Keeping your legs in front of you, bend your knees so they are both pointed toward the ceiling and your feet are flat on the   floor. Cross your arms over your chest.  Slightly tip your chin down without bending your neck.  Tense your abdominals and slowly lift your trunk high enough to just clear your shoulder blades. Lifting higher can put excessive stress on the low back and does not further strengthen your abdominal muscles.  Control your return to the starting position. Repeat __________ times. Complete this exercise __________ times per day.  STRENGTHENING - Quadruped, Opposite UE/LE Lift  Assume a hands and knees position on a firm surface. Keep your hands under your shoulders and your knees under your hips. You may place padding under your knees for comfort.  Find your neutral spine and gently tense your abdominal muscles so that you can maintain this position. Your shoulders and hips should form a rectangle  that is parallel with the floor and is not twisted.  Keeping your trunk steady, lift your right hand no higher than your shoulder and then your left leg no higher than your hip. Make sure you are not holding your breath. Hold this position __________ seconds.  Continuing to keep your abdominal muscles tense and your back steady, slowly return to your starting position. Repeat with the opposite arm and leg. Repeat __________ times. Complete this exercise __________ times per day.  STRENGTHENING - Abdominals and Quadriceps, Straight Leg Raise   Lie on a firm bed or floor with both legs extended in front of you.  Keeping one leg in contact with the floor, bend the other knee so that your foot can rest flat on the floor.  Find your neutral spine, and tense your abdominal muscles to maintain your spinal position throughout the exercise.  Slowly lift your straight leg off the floor about 6 inches for a count of 15, making sure to not hold your breath.  Still keeping your neutral spine, slowly lower your leg all the way to the floor. Repeat this exercise with each leg __________ times. Complete this exercise __________ times per day. POSTURE AND BODY MECHANICS CONSIDERATIONS - Sciatica Keeping correct posture when sitting, standing or completing your activities will reduce the stress put on different body tissues, allowing injured tissues a chance to heal and limiting painful experiences. The following are general guidelines for improved posture. Your physician or physical therapist will provide you with any instructions specific to your needs. While reading these guidelines, remember:  The exercises prescribed by your provider will help you have the flexibility and strength to maintain correct postures.  The correct posture provides the optimal environment for your joints to work. All of your joints have less wear and tear when properly supported by a spine with good posture. This means you will  experience a healthier, less painful body.  Correct posture must be practiced with all of your activities, especially prolonged sitting and standing. Correct posture is as important when doing repetitive low-stress activities (typing) as it is when doing a single heavy-load activity (lifting). RESTING POSITIONS Consider which positions are most painful for you when choosing a resting position. If you have pain with flexion-based activities (sitting, bending, stooping, squatting), choose a position that allows you to rest in a less flexed posture. You would want to avoid curling into a fetal position on your side. If your pain worsens with extension-based activities (prolonged standing, working overhead), avoid resting in an extended position such as sleeping on your stomach. Most people will find more comfort when they rest with their spine in a more neutral position, neither too rounded nor too   arched. Lying on a non-sagging bed on your side with a pillow between your knees, or on your back with a pillow under your knees will often provide some relief. Keep in mind, being in any one position for a prolonged period of time, no matter how correct your posture, can still lead to stiffness. PROPER SITTING POSTURE In order to minimize stress and discomfort on your spine, you must sit with correct posture Sitting with good posture should be effortless for a healthy body. Returning to good posture is a gradual process. Many people can work toward this most comfortably by using various supports until they have the flexibility and strength to maintain this posture on their own. When sitting with proper posture, your ears will fall over your shoulders and your shoulders will fall over your hips. You should use the back of the chair to support your upper back. Your low back will be in a neutral position, just slightly arched. You may place a small pillow or folded towel at the base of your low back for support.  When  working at a desk, create an environment that supports good, upright posture. Without extra support, muscles fatigue and lead to excessive strain on joints and other tissues. Keep these recommendations in mind: CHAIR:   A chair should be able to slide under your desk when your back makes contact with the back of the chair. This allows you to work closely.  The chair's height should allow your eyes to be level with the upper part of your monitor and your hands to be slightly lower than your elbows. BODY POSITION  Your feet should make contact with the floor. If this is not possible, use a foot rest.  Keep your ears over your shoulders. This will reduce stress on your neck and low back. INCORRECT SITTING POSTURES   If you are feeling tired and unable to assume a healthy sitting posture, do not slouch or slump. This puts excessive strain on your back tissues, causing more damage and pain. Healthier options include:  Using more support, like a lumbar pillow.  Switching tasks to something that requires you to be upright or walking.  Talking a brief walk.  Lying down to rest in a neutral-spine position. PROLONGED STANDING WHILE SLIGHTLY LEANING FORWARD  When completing a task that requires you to lean forward while standing in one place for a long time, place either foot up on a stationary 2-4 inch high object to help maintain the best posture. When both feet are on the ground, the low back tends to lose its slight inward curve. If this curve flattens (or becomes too large), then the back and your other joints will experience too much stress, fatigue more quickly and can cause pain.  CORRECT STANDING POSTURES Proper standing posture should be assumed with all daily activities, even if they only take a few moments, like when brushing your teeth. As in sitting, your ears should fall over your shoulders and your shoulders should fall over your hips. You should keep a slight tension in your abdominal  muscles to brace your spine. Your tailbone should point down to the ground, not behind your body, resulting in an over-extended swayback posture.  INCORRECT STANDING POSTURES  Common incorrect standing postures include a forward head, locked knees and/or an excessive swayback. WALKING Walk with an upright posture. Your ears, shoulders and hips should all line-up. PROLONGED ACTIVITY IN A FLEXED POSITION When completing a task that requires you to bend forward   at your waist or lean over a low surface, try to find a way to stabilize 3 of 4 of your limbs. You can place a hand or elbow on your thigh or rest a knee on the surface you are reaching across. This will provide you more stability so that your muscles do not fatigue as quickly. By keeping your knees relaxed, or slightly bent, you will also reduce stress across your low back. CORRECT LIFTING TECHNIQUES DO :   Assume a wide stance. This will provide you more stability and the opportunity to get as close as possible to the object which you are lifting.  Tense your abdominals to brace your spine; then bend at the knees and hips. Keeping your back locked in a neutral-spine position, lift using your leg muscles. Lift with your legs, keeping your back straight.  Test the weight of unknown objects before attempting to lift them.  Try to keep your elbows locked down at your sides in order get the best strength from your shoulders when carrying an object.  Always ask for help when lifting heavy or awkward objects. INCORRECT LIFTING TECHNIQUES DO NOT:   Lock your knees when lifting, even if it is a small object.  Bend and twist. Pivot at your feet or move your feet when needing to change directions.  Assume that you cannot safely pick up a paperclip without proper posture.   This information is not intended to replace advice given to you by your health care provider. Make sure you discuss any questions you have with your health care provider.     Document Released: 06/09/2005 Document Revised: 10/24/2014 Document Reviewed: 09/21/2008 [employer] Interactive Patient Education 2016 [employer]  

## 2015-12-11 NOTE — Progress Notes (Signed)
   Subjective:    Patient ID: [name], female    DOB: [birth year], 50 y.o.   MRN: [id number]  Back Pain This is a new problem. The current episode started in the past 7 days. The problem occurs constantly. The problem is unchanged. The pain is present in the lumbar spine. The quality of the pain is described as aching. The pain radiates to the right thigh and right foot. The pain is at a severity of 10/10. The pain is moderate. The symptoms are aggravated by bending, standing and twisting. Associated symptoms include leg pain, numbness and tingling. Pertinent negatives include no bladder incontinence, bowel incontinence, dysuria, headaches, pelvic pain or perianal numbness. She has tried bed rest and ice for the symptoms. The treatment provided mild relief.      Review of Systems  Constitutional: Negative.   HENT: Negative.   Eyes: Negative.   Respiratory: Negative.  Negative for shortness of breath.   Cardiovascular: Negative.  Negative for palpitations.  Gastrointestinal: Negative.  Negative for bowel incontinence.  Endocrine: Negative.   Genitourinary: Negative.  Negative for bladder incontinence, dysuria and pelvic pain.  Musculoskeletal: Positive for back pain.  Neurological: Positive for tingling and numbness. Negative for headaches.  Hematological: Negative.   Psychiatric/Behavioral: Negative.   All other systems reviewed and are negative.      Objective:   Physical Exam  Constitutional: She is oriented to person, place, and time. She appears well-developed and well-nourished. No distress.  HENT:  Head: Normocephalic.  Eyes: Pupils are equal, round, and reactive to light.  Neck: Normal range of motion. Neck supple. No thyromegaly present.  Cardiovascular: Normal rate, regular rhythm, normal heart sounds and intact distal pulses.   No murmur heard. Pulmonary/Chest: Effort normal and breath sounds normal. No respiratory distress. She has no wheezes.  Abdominal: Soft.  Bowel sounds are normal. She exhibits no distension. There is no tenderness.  Musculoskeletal: Normal range of motion. She exhibits no edema or tenderness.  Neurological: She is alert and oriented to person, place, and time.  Skin: Skin is warm and dry.  Psychiatric: She has a normal mood and affect. Her behavior is normal. Judgment and thought content normal.  Vitals reviewed.   Lumbar x ray- WNL Preliminary reading by Evelina Dun, FNP [facility]   BP 128/88 mmHg  Pulse 80  Temp(Src) 98.1 F (36.7 C) (Oral)  Ht 5' 6.5" (1.689 m)  Wt 131 lb 6.4 oz (59.603 kg)  BMI 20.89 kg/m2     Assessment & Plan:  1. Right-sided low back pain with right-sided sciatica -Rest -Ice -Heat -ROM exercises discussed- Handout given to pt -RTO prn  - DG Lumbar Spine 2-3 Views; Future - methylPREDNISolone acetate (DEPO-MEDROL) injection 80 mg; Inject 1 mL (80 mg total) into the muscle once. - ketorolac (TORADOL) injection 60 mg; Inject 2 mLs (60 mg total) into the muscle once. - cyclobenzaprine (FLEXERIL) 5 MG tablet; Take 1 tablet (5 mg total) by mouth 3 (three) times daily as needed for muscle spasms.  Dispense: 30 tablet; Refill: 0 - traMADol (ULTRAM) 50 MG tablet; Take 1 tablet (50 mg total) by mouth every 6 (six) hours as needed.  Dispense: 60 tablet; Refill: 5 - naproxen (NAPROSYN) 500 MG tablet; Take 1 tablet (500 mg total) by mouth 2 (two) times daily with a meal.  Dispense: 60 tablet; Refill: [place], FNP

## 2015-12-14 ENCOUNTER — Ambulatory Visit: Payer: Medicaid Other | Admitting: Family

## 2015-12-17 ENCOUNTER — Ambulatory Visit (AMBULATORY_SURGERY_CENTER): Payer: Medicaid Other | Admitting: Gastroenterology

## 2015-12-17 ENCOUNTER — Encounter: Payer: Self-pay | Admitting: Gastroenterology

## 2015-12-17 VITALS — BP 124/73 | HR 70 | Temp 96.8°F | Resp 17 | Ht 66.0 in | Wt 131.0 lb

## 2015-12-17 DIAGNOSIS — Z1211 Encounter for screening for malignant neoplasm of colon: Secondary | ICD-10-CM | POA: Diagnosis not present

## 2015-12-17 DIAGNOSIS — D129 Benign neoplasm of anus and anal canal: Secondary | ICD-10-CM

## 2015-12-17 DIAGNOSIS — K621 Rectal polyp: Secondary | ICD-10-CM

## 2015-12-17 DIAGNOSIS — D128 Benign neoplasm of rectum: Secondary | ICD-10-CM

## 2015-12-17 MED ORDER — SODIUM CHLORIDE 0.9 % IV SOLN: 500.0000 mL | INTRAVENOUS | Status: DC

## 2015-12-17 NOTE — Progress Notes (Signed)
Report given to PACU RN, vss 

## 2015-12-17 NOTE — Progress Notes (Signed)
Called to room to assist during endoscopic procedure.  Patient ID and intended procedure confirmed with present staff. Received instructions for my participation in the procedure from the performing physician.  

## 2015-12-17 NOTE — Patient Instructions (Signed)
YOU HAD AN ENDOSCOPIC PROCEDURE TODAY AT [facility]:   Refer to the procedure report that was given to you for any specific questions about what was found during the examination.  If the procedure report does not answer your questions, please call your gastroenterologist to clarify.  If you requested that your care partner not be given the details of your procedure findings, then the procedure report has been included in a sealed envelope for you to review at your convenience later.  YOU SHOULD EXPECT: Some feelings of bloating in the abdomen. Passage of more gas than usual.  Walking can help get rid of the air that was put into your GI tract during the procedure and reduce the bloating. If you had a lower endoscopy (such as a colonoscopy or flexible sigmoidoscopy) you may notice spotting of blood in your stool or on the toilet paper. If you underwent a bowel prep for your procedure, you may not have a normal bowel movement for a few days.  Please Note:  You might notice some irritation and congestion in your nose or some drainage.  This is from the oxygen used during your procedure.  There is no need for concern and it should clear up in a day or so.  SYMPTOMS TO REPORT IMMEDIATELY:   Following lower endoscopy (colonoscopy or flexible sigmoidoscopy):  Excessive amounts of blood in the stool  Significant tenderness or worsening of abdominal pains  Swelling of the abdomen that is new, acute  Fever of 100F or higher   For urgent or emergent issues, a gastroenterologist can be reached at any hour by calling [phone].   DIET: Your first meal following the procedure should be a small meal and then it is ok to progress to your normal diet. Heavy or fried foods are harder to digest and may make you feel nauseous or bloated.  Likewise, meals heavy in dairy and vegetables can increase bloating.  Drink plenty of fluids but you should avoid alcoholic beverages for 24  hours.  ACTIVITY:  You should plan to take it easy for the rest of today and you should NOT DRIVE or use heavy machinery until tomorrow (because of the sedation medicines used during the test).    FOLLOW UP: Our staff will call the number listed on your records the next business day following your procedure to check on you and address any questions or concerns that you may have regarding the information given to you following your procedure. If we do not reach you, we will leave a message.  However, if you are feeling well and you are not experiencing any problems, there is no need to return our call.  We will assume that you have returned to your regular daily activities without incident.  If any biopsies were taken you will be contacted by phone or by letter within the next 1-3 weeks.  Please call us at [phone] if you have not heard about the biopsies in 3 weeks.    SIGNATURES/CONFIDENTIALITY: You and/or your care partner have signed paperwork which will be entered into your electronic medical record.  These signatures attest to the fact that that the information above on your After Visit Summary has been reviewed and is understood.  Full responsibility of the confidentiality of this discharge information lies with you and/or your care-partner.  Polyps, diverticulosis, hemorrhoids, handouts given. Await pathology results to determine next colonoscopy.

## 2015-12-17 NOTE — Progress Notes (Signed)
Patient drank sweet tea at 0650. Informed CRNA. Procedure to be delayed.

## 2015-12-17 NOTE — Op Note (Signed)
[facility] Patient Name: [name] Procedure Date: 12/17/2015 9:49 AM MRN: [id number] Endoscopist: Remo Lipps P. Havery Moros , MD Age: 50 Referring MD:  Date of Birth: December 16, 1965 Gender: Female Account #: [id number] Procedure:                Colonoscopy Indications:              Screening for malignant neoplasm in the colon, This                            is the patient's first colonoscopy Medicines:                Monitored Anesthesia Care Procedure:                Pre-Anesthesia Assessment:                           - Prior to the procedure, a History and Physical                            was performed, and patient medications and                            allergies were reviewed. The patient's tolerance of                            previous anesthesia was also reviewed. The risks                            and benefits of the procedure and the sedation                            options and risks were discussed with the patient.                            All questions were answered, and informed consent                            was obtained. Prior Anticoagulants: The patient has                            taken no previous anticoagulant or antiplatelet                            agents. ASA Grade Assessment: II - A patient with                            mild systemic disease. After reviewing the risks                            and benefits, the patient was deemed in                            satisfactory condition to undergo the procedure.  After obtaining informed consent, the colonoscope                            was passed under direct vision. Throughout the                            procedure, the patient's blood pressure, pulse, and                            oxygen saturations were monitored continuously. The                            Model PCF-H190DL [id number]) scope was introduced                            through the anus  and advanced to the the cecum,                            identified by appendiceal orifice and ileocecal                            valve. The colonoscopy was performed without                            difficulty. The patient tolerated the procedure                            well. The quality of the bowel preparation was                            adequate. The ileocecal valve, appendiceal orifice,                            and rectum were photographed. Scope In: 10:00:35 AM Scope Out: 10:24:30 AM Scope Withdrawal Time: 0 hours 19 minutes 12 seconds  Total Procedure Duration: 0 hours 23 minutes 55 seconds  Findings:                 The perianal and digital rectal examinations were                            normal.                           Four sessile polyps were found in the rectum. The                            polyps were 2 to 3 mm in size. These polyps were                            removed with a cold biopsy forceps. Resection and                            retrieval were complete.  Multiple medium-mouthed diverticula were found in                            the transverse colon and left colon.                           Non-bleeding internal hemorrhoids were found during                            retroflexion.                           The exam was otherwise without abnormality. The                            bowel preparation was only fair on intubation and                            time was spent lavaging the colon to ensure                            adequate views of obtained. There was also                            significant colon spasm which prolonged the                            procedure. Complications:            No immediate complications. Estimated blood loss:                            Minimal. Estimated Blood Loss:     Estimated blood loss was minimal. Impression:               - Four 2 to 3 mm polyps in the rectum, removed  with                            a cold biopsy forceps. Suspect hyperplastic but                            tesected and retrieved.                           - Diverticulosis in the transverse colon and in the                            left colon.                           - Non-bleeding internal hemorrhoids.                           - The examination was otherwise normal. Recommendation:           - Patient has a contact number available for  emergencies. The signs and symptoms of potential                            delayed complications were discussed with the                            patient. Return to normal activities tomorrow.                            Written discharge instructions were provided to the                            patient.                           - Resume previous diet.                           - Continue present medications.                           - Await pathology results.                           - Repeat colonoscopy is recommended for                            surveillance. The colonoscopy date will be                            determined after pathology results from today's                            exam become available for review. Remo Lipps P. Havery Moros, MD 12/17/2015 10:31:29 AM This report has been signed electronically.

## 2015-12-18 ENCOUNTER — Telehealth: Payer: Self-pay

## 2015-12-18 NOTE — Telephone Encounter (Signed)
  Follow up Call-  Call back number 12/17/2015  Post procedure Call Back phone  # [phone]  Permission to leave phone message Yes     Patient was called for follow up after her procedure on 12/17/2015. I spoke with Baker Janus, and she reports that Shakena has returned to her normal daily activities without any difficulty.

## 2015-12-26 ENCOUNTER — Encounter: Payer: Self-pay | Admitting: Gastroenterology

## 2016-02-09 ENCOUNTER — Other Ambulatory Visit: Payer: Self-pay | Admitting: Family

## 2016-02-09 DIAGNOSIS — M5442 Lumbago with sciatica, left side: Secondary | ICD-10-CM

## 2016-03-14 ENCOUNTER — Ambulatory Visit (INDEPENDENT_AMBULATORY_CARE_PROVIDER_SITE_OTHER): Payer: Medicaid Other | Admitting: Family

## 2016-03-14 ENCOUNTER — Encounter: Payer: Self-pay | Admitting: Family

## 2016-03-14 VITALS — BP 123/81 | HR 97 | Temp 98.3°F | Ht 66.0 in | Wt 126.6 lb

## 2016-03-14 DIAGNOSIS — J0100 Acute maxillary sinusitis, unspecified: Secondary | ICD-10-CM | POA: Diagnosis not present

## 2016-03-14 MED ORDER — FLUTICASONE PROPIONATE 50 MCG/ACT NA SUSP: 2.0000 | Freq: Every day | NASAL | 6 refills | Status: DC

## 2016-03-14 MED ORDER — AMOXICILLIN-POT CLAVULANATE 875-125 MG PO TABS: 1.0000 | ORAL_TABLET | Freq: Two times a day (BID) | ORAL | 0 refills | Status: DC

## 2016-03-14 NOTE — Patient Instructions (Signed)

## 2016-03-14 NOTE — Progress Notes (Signed)
   Subjective:    Patient ID: [name], female    DOB: [birth year], 50 y.o.   MRN: [id number]  Sinus Problem  This is a new problem. The current episode started 1 to 4 weeks ago. The problem has been waxing and waning since onset. There has been no fever. Her pain is at a severity of 7/10. The pain is mild. Associated symptoms include congestion, coughing, headaches, a hoarse voice, shortness of breath, sinus pressure and sneezing. Pertinent negatives include no chills or ear pain. Past treatments include oral decongestants and acetaminophen. The treatment provided mild relief.      Review of Systems  Constitutional: Negative for chills.  HENT: Positive for congestion, hoarse voice, sinus pressure and sneezing. Negative for ear pain.   Respiratory: Positive for cough and shortness of breath.   Neurological: Positive for headaches.  All other systems reviewed and are negative.      Objective:   Physical Exam  Constitutional: She is oriented to person, place, and time. She appears well-developed and well-nourished. No distress.  HENT:  Head: Normocephalic and atraumatic.  Right Ear: External ear normal.  Left Ear: External ear normal.  Nose: Mucosal edema and rhinorrhea present. Right sinus exhibits maxillary sinus tenderness. Left sinus exhibits maxillary sinus tenderness.  Mouth/Throat: Posterior oropharyngeal erythema present.  Eyes: Pupils are equal, round, and reactive to light.  Neck: Normal range of motion. Neck supple. No thyromegaly present.  Cardiovascular: Normal rate, regular rhythm, normal heart sounds and intact distal pulses.   No murmur heard. Pulmonary/Chest: Effort normal and breath sounds normal. No respiratory distress. She has no wheezes.  Intermittent nonproductive cough   Abdominal: Soft. Bowel sounds are normal. She exhibits no distension. There is no tenderness.  Musculoskeletal: Normal range of motion. She exhibits no edema or tenderness.    Neurological: She is alert and oriented to person, place, and time. She has normal reflexes. No cranial nerve deficit.  Skin: Skin is warm and dry.  Psychiatric: She has a normal mood and affect. Her behavior is normal. Judgment and thought content normal.  Vitals reviewed.     BP 123/81   Pulse 97   Temp 98.3 F (36.8 C) (Oral)   Ht 5\' 6"  (1.676 m)   Wt 126 lb 9.6 oz (57.4 kg)   BMI 20.43 kg/m      Assessment & Plan:  1. Acute maxillary sinusitis, recurrence not specified -.- Take meds as prescribed - Use a cool mist humidifier  -Use saline nose sprays frequently -Saline irrigations of the nose can be very helpful if done frequently.  * 4X daily for 1 week*  * Use of a nettie pot can be helpful with this. Follow directions with this* -Force fluids -For any cough or congestion  Use plain Mucinex- regular strength or max strength is fine   * Children- consult with Pharmacist for dosing -For fever or aces or pains- take tylenol or ibuprofen appropriate for age and weight.  * for fevers greater than 101 orally you may alternate ibuprofen and tylenol every  3 hours. -Throat lozenges if help -New toothbrush in 3 days - amoxicillin-clavulanate (AUGMENTIN) 875-125 MG tablet; Take 1 tablet by mouth 2 (two) times daily.  Dispense: 14 tablet; Refill: 0 - fluticasone (FLONASE) 50 MCG/ACT nasal spray; Place 2 sprays into both nostrils daily.  Dispense: 16 g; Refill: Cherry Log, FNP

## 2016-04-08 ENCOUNTER — Encounter: Payer: Self-pay | Admitting: *Deleted

## 2016-04-21 ENCOUNTER — Ambulatory Visit: Payer: Medicaid Other | Admitting: Family

## 2016-04-22 ENCOUNTER — Ambulatory Visit (INDEPENDENT_AMBULATORY_CARE_PROVIDER_SITE_OTHER): Payer: Medicaid Other | Admitting: Family

## 2016-04-22 ENCOUNTER — Ambulatory Visit (INDEPENDENT_AMBULATORY_CARE_PROVIDER_SITE_OTHER): Payer: Medicaid Other

## 2016-04-22 ENCOUNTER — Encounter: Payer: Self-pay | Admitting: Family

## 2016-04-22 VITALS — BP 140/89 | HR 84 | Temp 98.4°F | Ht 66.0 in | Wt 129.6 lb

## 2016-04-22 DIAGNOSIS — D509 Iron deficiency anemia, unspecified: Secondary | ICD-10-CM | POA: Diagnosis not present

## 2016-04-22 DIAGNOSIS — M5441 Lumbago with sciatica, right side: Secondary | ICD-10-CM | POA: Diagnosis not present

## 2016-04-22 DIAGNOSIS — F411 Generalized anxiety disorder: Secondary | ICD-10-CM

## 2016-04-22 DIAGNOSIS — Z23 Encounter for immunization: Secondary | ICD-10-CM | POA: Diagnosis not present

## 2016-04-22 DIAGNOSIS — E782 Mixed hyperlipidemia: Secondary | ICD-10-CM

## 2016-04-22 DIAGNOSIS — Z780 Asymptomatic menopausal state: Secondary | ICD-10-CM

## 2016-04-22 DIAGNOSIS — K5900 Constipation, unspecified: Secondary | ICD-10-CM

## 2016-04-22 DIAGNOSIS — I10 Essential (primary) hypertension: Secondary | ICD-10-CM

## 2016-04-22 DIAGNOSIS — G8929 Other chronic pain: Secondary | ICD-10-CM | POA: Diagnosis not present

## 2016-04-22 MED ORDER — NAPROXEN 500 MG PO TABS: 500.0000 mg | ORAL_TABLET | Freq: Two times a day (BID) | ORAL | 1 refills | Status: DC

## 2016-04-22 MED ORDER — SIMVASTATIN 20 MG PO TABS: 20.0000 mg | ORAL_TABLET | Freq: Every day | ORAL | 1 refills | Status: DC

## 2016-04-22 MED ORDER — TRAMADOL HCL 50 MG PO TABS: 50.0000 mg | ORAL_TABLET | Freq: Four times a day (QID) | ORAL | 5 refills | Status: DC | PRN

## 2016-04-22 MED ORDER — CYCLOBENZAPRINE HCL 5 MG PO TABS
5.0000 mg | ORAL_TABLET | Freq: Three times a day (TID) | ORAL | 0 refills | Status: DC | PRN
Start: 2016-04-22 — End: 2016-05-14

## 2016-04-22 NOTE — Patient Instructions (Signed)

## 2016-04-22 NOTE — Progress Notes (Signed)
Subjective:    Patient ID: [name], female    DOB: 24-Nov-1965, 50 y.o.   MRN: [id number]  Pt presents to the office today for chronic follow up. General muscle pain present and was present prior to starting the statin. She was seen 8 days ago for right ear pain in office. The ear was irrigated and her pain lessened.  No medications were prescribed at that time.  Today she is complaining of a recurrence of this pain. She has had no drainage, fever or hearing loss.    Hyperlipidemia  This is a chronic problem. The current episode started more than 1 year ago. The problem is controlled. Recent lipid tests were reviewed and are normal. She has no history of diabetes, hypothyroidism or obesity. Pertinent negatives include no chest pain, leg pain, myalgias or shortness of breath. Current antihyperlipidemic treatment includes statins. The current treatment provides significant improvement of lipids. There are no compliance problems.  Risk factors for coronary artery disease include dyslipidemia, family history, hypertension and a sedentary lifestyle.  Hypertension  This is a chronic problem. The current episode started more than 1 year ago. The problem has been resolved since onset. The problem is controlled. Associated symptoms include anxiety. Pertinent negatives include no chest pain, malaise/fatigue, palpitations, peripheral edema or shortness of breath. Risk factors for coronary artery disease include dyslipidemia and family history. Past treatments include nothing. The current treatment provides no improvement. Compliance problems include exercise.  There is no history of kidney disease, CAD/MI, CVA, heart failure or a thyroid problem. There is no history of sleep apnea.  Anxiety  Presents for follow-up visit. Onset was at an unknown time. The problem has been gradually improving. Patient reports no chest pain, compulsions, confusion, decreased concentration, depressed mood, excessive worry,  hyperventilation, insomnia, irritability, nausea, nervous/anxious behavior, palpitations, panic or shortness of breath. Symptoms occur rarely. The severity of symptoms is moderate. The quality of sleep is good. Nighttime awakenings: none.   Her past medical history is significant for anemia and anxiety/panic attacks. There is no history of CAD or depression. Past treatments include SSRIs. Compliance with prior treatments has been good.  Constipation  This is a chronic problem. The current episode started more than 1 year ago. The problem has been resolved since onset. Her stool frequency is 1 time per day. The patient is on a high fiber diet. She exercises regularly. There has been adequate water intake. Pertinent negatives include no flatus or nausea. Risk factors include dietary change. She has tried diet changes, stool softeners and laxatives for the symptoms. The treatment provided moderate relief.  Anemia  Presents for follow-up visit. There has been no bruising/bleeding easily, confusion, light-headedness, malaise/fatigue or palpitations. Past treatments include oral iron supplements. There is no history of heart failure or hypothyroidism. Family history includes iron deficiency.      Review of Systems  Constitutional: Negative.  Negative for appetite change, irritability and malaise/fatigue.  HENT: Negative.   Eyes: Negative.   Respiratory: Negative.  Negative for shortness of breath.   Cardiovascular: Negative for chest pain and palpitations.  Gastrointestinal: Positive for constipation. Negative for flatus and nausea.  Endocrine: Negative.   Genitourinary: Negative.   Musculoskeletal: Positive for arthralgias. Negative for myalgias.  Neurological: Negative for light-headedness.  Hematological: Negative.  Does not bruise/bleed easily.  Psychiatric/Behavioral: Negative.  Negative for confusion and decreased concentration. The patient is not nervous/anxious and does not have insomnia.     All other systems reviewed and are negative.  Objective:   Physical Exam  Constitutional: She is oriented to person, place, and time. Vital signs are normal. She appears well-developed and well-nourished. No distress.  HENT:  Head: Normocephalic and atraumatic.  Right Ear: No drainage or tenderness. Tympanic membrane is not injected and not erythematous. No middle ear effusion.  Left Ear: Tympanic membrane and external ear normal. No drainage or swelling.  No middle ear effusion.  Nose: Nose normal.  Mouth/Throat: Oropharynx is clear and moist.  Eyes: Conjunctivae and EOM are normal. Pupils are equal, round, and reactive to light.  Neck: Normal range of motion. Neck supple. Carotid bruit is not present. No thyroid mass and no thyromegaly present.  Cardiovascular: Normal rate, regular rhythm, normal heart sounds, intact distal pulses and normal pulses.   No murmur heard. Pulmonary/Chest: Effort normal and breath sounds normal. No respiratory distress. She has no wheezes.  Abdominal: Soft. Bowel sounds are normal. She exhibits no distension. There is no tenderness.  Musculoskeletal: She exhibits no edema.  Lymphadenopathy:       Head (right side): No submental adenopathy present.       Head (left side): No submental adenopathy present.    She has no cervical adenopathy.  Neurological: She is alert and oriented to person, place, and time. She has normal strength and normal reflexes. No cranial nerve deficit or sensory deficit.  Skin: Skin is warm and dry. No rash noted.  Psychiatric: She has a normal mood and affect. Her speech is normal and behavior is normal. Judgment and thought content normal. Her mood appears not anxious. Her affect is not inappropriate. She does not exhibit a depressed mood.  Vitals reviewed.     BP 140/89   Pulse 84   Temp 98.4 F (36.9 C) (Oral)   Ht '5\' 6"'  (1.676 m)   Wt 129 lb 9.6 oz (58.8 kg)   BMI 20.92 kg/m      Assessment & Plan:  1.  Essential hypertension, benign - CMP14+EGFR - CBC with Differential/Platelet  2. Constipation, unspecified constipation type - CMP14+EGFR - CBC with Differential/Platelet  3. Mixed hyperlipidemia - CMP14+EGFR - CBC with Differential/Platelet - Lipid panel  4. GAD (generalized anxiety disorder) - CMP14+EGFR - CBC with Differential/Platelet  5. Iron deficiency anemia, unspecified iron deficiency anemia type - CMP14+EGFR - CBC with Differential/Platelet  6. Chronic right-sided low back pain with right-sided sciatica - traMADol (ULTRAM) 50 MG tablet; Take 1 tablet (50 mg total) by mouth every 6 (six) hours as needed.  Dispense: 60 tablet; Refill: 5 - cyclobenzaprine (FLEXERIL) 5 MG tablet; Take 1 tablet (5 mg total) by mouth 3 (three) times daily as needed for muscle spasms.  Dispense: 30 tablet; Refill: 0 - CMP14+EGFR - CBC with Differential/Platelet  7. Post-menopause - DG [facility] DEXA   Continue all meds Labs pending Health Maintenance reviewed Diet and exercise encouraged RTO 6 months  Evelina Dun, FNP

## 2016-04-23 ENCOUNTER — Ambulatory Visit (INDEPENDENT_AMBULATORY_CARE_PROVIDER_SITE_OTHER): Payer: Medicaid Other | Admitting: Pharmacist

## 2016-04-23 ENCOUNTER — Encounter: Payer: Self-pay | Admitting: Pharmacist

## 2016-04-23 DIAGNOSIS — M818 Other osteoporosis without current pathological fracture: Secondary | ICD-10-CM | POA: Diagnosis not present

## 2016-04-23 DIAGNOSIS — M810 Age-related osteoporosis without current pathological fracture: Secondary | ICD-10-CM | POA: Insufficient documentation

## 2016-04-23 LAB — CBC WITH DIFFERENTIAL/PLATELET
BASOS ABS: 0 10*3/uL (ref 0.0–0.2)
Basos: 0 %
EOS (ABSOLUTE): 0.2 10*3/uL (ref 0.0–0.4)
Eos: 3 %
Hematocrit: 40.8 % (ref 34.0–46.6)
Hemoglobin: 13.3 g/dL (ref 11.1–15.9)
Immature Grans (Abs): 0 10*3/uL (ref 0.0–0.1)
Immature Granulocytes: 0 %
LYMPHS ABS: 2.8 10*3/uL (ref 0.7–3.1)
Lymphs: 37 %
MCH: 31.2 pg (ref 26.6–33.0)
MCHC: 32.6 g/dL (ref 31.5–35.7)
MCV: 96 fL (ref 79–97)
MONOCYTES: 13 %
MONOS ABS: 1 10*3/uL — AB (ref 0.1–0.9)
NEUTROS ABS: 3.5 10*3/uL (ref 1.4–7.0)
Neutrophils: 47 %
PLATELETS: 304 10*3/uL (ref 150–379)
RBC: 4.26 x10E6/uL (ref 3.77–5.28)
RDW: 12.6 % (ref 12.3–15.4)
WBC: 7.6 10*3/uL (ref 3.4–10.8)

## 2016-04-23 LAB — CMP14+EGFR
A/G RATIO: 1.7 (ref 1.2–2.2)
ALBUMIN: 4.2 g/dL (ref 3.5–5.5)
ALK PHOS: 78 IU/L (ref 39–117)
ALT: 12 IU/L (ref 0–32)
AST: 17 IU/L (ref 0–40)
BUN / CREAT RATIO: 13 (ref 9–23)
BUN: 9 mg/dL (ref 6–24)
CHLORIDE: 98 mmol/L (ref 96–106)
CO2: 29 mmol/L (ref 18–29)
Calcium: 9.4 mg/dL (ref 8.7–10.2)
Creatinine, Ser: 0.68 mg/dL (ref 0.57–1.00)
GFR calc non Af Amer: 102 mL/min/{1.73_m2} (ref >59)
GFR, EST AFRICAN AMERICAN: 118 mL/min/{1.73_m2} (ref >59)
GLUCOSE: 79 mg/dL (ref 65–99)
Globulin, Total: 2.5 g/dL (ref 1.5–4.5)
POTASSIUM: 4.3 mmol/L (ref 3.5–5.2)
SODIUM: 140 mmol/L (ref 134–144)
Total Protein: 6.7 g/dL (ref 6.0–8.5)

## 2016-04-23 LAB — LIPID PANEL
CHOLESTEROL TOTAL: 143 mg/dL (ref 100–199)
Chol/HDL Ratio: 2.6 ratio units (ref 0.0–4.4)
HDL: 54 mg/dL (ref >39)
LDL CALC: 55 mg/dL (ref 0–99)
TRIGLYCERIDES: 168 mg/dL — AB (ref 0–149)
VLDL Cholesterol Cal: 34 mg/dL (ref 5–40)

## 2016-04-23 MED ORDER — CALCIUM CARBONATE-VITAMIN D 500-200 MG-UNIT PO TABS: 1.0000 | ORAL_TABLET | Freq: Every day | ORAL | Status: AC

## 2016-04-23 MED ORDER — ALENDRONATE SODIUM 70 MG PO TABS: 70.0000 mg | ORAL_TABLET | ORAL | 11 refills | Status: DC

## 2016-04-23 NOTE — Patient Instructions (Signed)
Start alendronate 61m for bone - take 1 tablet once a week.  Take in the morning before you eat or drink anything or take any other medications.  Take with at least 4 ounces of water.  Do not eat or drink anything other than water for 30 minutes.   Also start calcium supplement - take 500 to 6032monce a day  Continue to try to decrease the number of cigarettes you smoke per day.  Smoking can affect your bones and make them weaker.   Increase calcium rich foods - green leafy vegetables, broccoli, milk or almond milk, yogurt.                 Exercise for Strong Bones  Exercise is important to build and maintain strong bones / bone density.  There are 2 types of exercises that are important to building and maintaining strong bones:  Weight- bearing and muscle-stregthening.  Weight-bearing Exercises  These exercises include activities that make you move against gravity while staying upright. Weight-bearing exercises can be high-impact or low-impact.  High-impact weight-bearing exercises help build bones and keep them strong. If you have broken a bone due to osteoporosis or are at risk of breaking a bone, you may need to avoid high-impact exercises. If you're not sure, you should check with your healthcare provider.  Examples of high-impact weight-bearing exercises are: Dancing  Doing high-impact aerobics  Hiking  Jogging/running  Jumping Rope  Stair climbing  Tennis  Low-impact weight-bearing exercises can also help keep bones strong and are a safe alternative if you cannot do high-impact exercises.   Examples of low-impact weight-bearing exercises are: Using elliptical training machines  Doing low-impact aerobics  Using stair-step machines  Fast walking on a treadmill or outside   Muscle-Strengthening Exercises These exercises include activities where you move your body, a weight or some other resistance against gravity. They are also known as resistance exercises and  include: Lifting weights  Using elastic exercise bands  Using weight machines  Lifting your own body weight  Functional movements, such as standing and rising up on your toes  Yoga and Pilates can also improve strength, balance and flexibility. However, certain positions may not be safe for people with osteoporosis or those at increased risk of broken bones. For example, exercises that have you bend forward may increase the chance of breaking a bone in the spine.   Non-Impact Exercises There are other types of exercises that can help prevent falls.  Non-impact exercises can help you to improve balance, posture and how well you move in everyday activities. Some of these exercises include: Balance exercises that strengthen your legs and test your balance, such as Tai Chi, can decrease your risk of falls.  Posture exercises that improve your posture and reduce rounded or "sloping" shoulders can help you decrease the chance of breaking a bone, especially in the spine.  Functional exercises that improve how well you move can help you with everyday activities and decrease your chance of falling and breaking a bone. For example, if you have trouble getting up from a chair or climbing stairs, you should do these activities as exercises.   **A physical therapist can teach you balance, posture and functional exercises. He/she can also help you learn which exercises are safe and appropriate for you.  [facility] has a physical therapy office in [place] front of our office and referrals can be made for assessments and treatment as needed and strength and balance training.  If you would like to have an assessment with [facility] and our physical therapy team please let a nurse or provider know.  Fall Prevention in the Home  Falls can cause injuries and can affect people from all age groups. There are many simple things that you can do to make your home safe and to help prevent falls. WHAT CAN I DO ON THE  OUTSIDE OF MY HOME?  Regularly repair the edges of walkways and driveways and fix any cracks.  Remove high doorway thresholds.  Trim any shrubbery on the main path into your home.  Use bright outdoor lighting.  Clear walkways of debris and clutter, including tools and rocks.  Regularly check that handrails are securely fastened and in good repair. Both sides of any steps should have handrails.  Install guardrails along the edges of any raised decks or porches.  Have leaves, snow, and ice cleared regularly.  Use sand or salt on walkways during winter months.  In the garage, clean up any spills right away, including grease or oil spills. WHAT CAN I DO IN THE BATHROOM?  Use night lights.  Install grab bars by the toilet and in the tub and shower. Do not use towel bars as grab bars.  Use non-skid mats or decals on the floor of the tub or shower.  If you need to sit down while you are in the shower, use a plastic, non-slip stool.[employer] [employer]  Keep the floor dry. Immediately clean up any water that spills on the floor.  Remove soap buildup in the tub or shower on a regular basis.  Attach bath mats securely with double-sided non-slip rug tape.  Remove throw rugs and other tripping hazards from the floor. WHAT CAN I DO IN THE BEDROOM?  Use night lights.  Make sure that a bedside light is easy to reach.  Do not use oversized bedding that drapes onto the floor.  Have a firm chair that has side arms to use for getting dressed.  Remove throw rugs and other tripping hazards from the floor. WHAT CAN I DO IN THE KITCHEN?   Clean up any spills right away.  Avoid walking on wet floors.  Place frequently used items in easy-to-reach places.  If you need to reach for something above you, use a sturdy step stool that has a grab bar.  Keep electrical cables out of the way.  Do not use floor polish or wax that makes floors slippery. If you have to use wax, make sure that it is non-skid  floor wax.  Remove throw rugs and other tripping hazards from the floor. WHAT CAN I DO IN THE STAIRWAYS?  Do not leave any items on the stairs.  Make sure that there are handrails on both sides of the stairs. Fix handrails that are broken or loose. Make sure that handrails are as long as the stairways.  Check any carpeting to make sure that it is firmly attached to the stairs. Fix any carpet that is loose or worn.  Avoid having throw rugs at the top or bottom of stairways, or secure the rugs with carpet tape to prevent them from moving.  Make sure that you have a light switch at the top of the stairs and the bottom of the stairs. If you do not have them, have them installed. WHAT ARE SOME OTHER FALL PREVENTION TIPS?  Wear closed-toe shoes that fit well and support your feet. Wear shoes that have rubber soles or low heels.  When you  use a stepladder, make sure that it is completely opened and that the sides are firmly locked. Have someone hold the ladder while you are using it. Do not climb a closed stepladder.  Add color or contrast paint or tape to grab bars and handrails in your home. Place contrasting color strips on the first and last steps.  Use mobility aids as needed, such as canes, walkers, scooters, and crutches.  Turn on lights if it is dark. Replace any light bulbs that burn out.  Set up furniture so that there are clear paths. Keep the furniture in the same spot.  Fix any uneven floor surfaces.  Choose a carpet design that does not hide the edge of steps of a stairway.  Be aware of any and all pets.  Review your medicines with your healthcare provider. Some medicines can cause dizziness or changes in blood pressure, which increase your risk of falling. Talk with your health care provider about other ways that you can decrease your risk of falls. This may include working with a physical therapist or trainer to improve your strength, balance, and endurance.   This  information is not intended to replace advice given to you by your health care provider. Make sure you discuss any questions you have with your health care provider.   Document Released: 05/30/2002 Document Revised: 10/24/2014 Document Reviewed: 07/14/2014 Elsevier Interactive Patient Education [employer].

## 2016-04-23 NOTE — Progress Notes (Signed)
Osteoporosis [facility] Current Height: Height: 5\' 6"  (167.6 cm)      Max Lifetime Height:  5\' 6"  Current Weight: Weight: 130 lb (59 kg)       Ethnicity:Caucasian   HPI: Does pt already have a diagnosis of:  Osteopenia?  No Osteoporosis?  No  Back Pain?  No       Kyphosis?  No Prior fracture?  Yes - knee and foot Med(s) for Osteoporosis/Osteopenia:  none Med(s) previously tried for Osteoporosis/Osteopenia:  none                                                             PMH: Age at menopause:  Surgical around 50yo Hysterectomy?  Yes Oophorectomy?  No HRT? No Steroid Use?  No Thyroid med?  No History of cancer?  No History of digestive disorders (ie Crohn's)?  Yes Current or previous eating disorders?  No Last Vitamin D Result:  42.8 (02/17/2014) Last GFR Result:  102 (04/22/2016)   FH/SH: Family history of osteoporosis?  Yes - father Parent with history of hip fracture?  No Family history of breast cancer?  No Exercise?  No Smoking?  Yes - 10 to 20 cigarettes per day Alcohol?  No    Calcium Assessment Calcium Intake  # of servings/day  Calcium mg  Milk (8 oz) 0  x  300  = 0  Yogurt (4 oz) 0 x  200 = 0  Cheese (1 oz) 1 x  200 = 200mg   Other Calcium sources   250mg   Ca supplement 0 = 0   Estimated calcium intake per day 450mg     DEXA Results Date of Test T-Score for AP Spine L1-L4 T-Score for Total Left Hip  04/22/2016 -2.9 -2.5                Assessment: osteoporosis  Recommendations: 1.  Start  alendronate (FOSAMAX)  2.  recommend calcium 1200mg  daily through supplementation or diet.  3.  recommend weight bearing exercise - 30 minutes at least 4 days per week.   4.  Counseled and educated about fall risk and prevention.  Recheck DEXA:  2 years  Time spent counseling patient:  30 minutes

## 2016-04-24 ENCOUNTER — Encounter: Payer: Self-pay | Admitting: Pharmacist

## 2016-05-06 ENCOUNTER — Ambulatory Visit (INDEPENDENT_AMBULATORY_CARE_PROVIDER_SITE_OTHER): Payer: Medicaid Other | Admitting: Family

## 2016-05-06 ENCOUNTER — Encounter: Payer: Self-pay | Admitting: Family

## 2016-05-06 VITALS — BP 125/85 | HR 99 | Temp 97.8°F | Ht 66.0 in | Wt 134.2 lb

## 2016-05-06 DIAGNOSIS — R319 Hematuria, unspecified: Secondary | ICD-10-CM | POA: Diagnosis not present

## 2016-05-06 DIAGNOSIS — N3001 Acute cystitis with hematuria: Secondary | ICD-10-CM

## 2016-05-06 LAB — URINALYSIS, COMPLETE
Bilirubin, UA: NEGATIVE
GLUCOSE, UA: NEGATIVE
Ketones, UA: NEGATIVE
NITRITE UA: NEGATIVE
Specific Gravity, UA: 1.005 (ref 1.005–1.030)
Urobilinogen, Ur: 0.2 mg/dL (ref 0.2–1.0)
pH, UA: 6 (ref 5.0–7.5)

## 2016-05-06 LAB — MICROSCOPIC EXAMINATION

## 2016-05-06 MED ORDER — SULFAMETHOXAZOLE-TRIMETHOPRIM 800-160 MG PO TABS: 1.0000 | ORAL_TABLET | Freq: Two times a day (BID) | ORAL | 0 refills | Status: DC

## 2016-05-06 NOTE — Addendum Note (Signed)
Addended by: Evelina Dun A on: 05/06/2016 12:54 PM   Modules accepted: Orders

## 2016-05-06 NOTE — Patient Instructions (Signed)
Urinary Tract Infection, Adult Introduction A urinary tract infection (UTI) is an infection of any part of the urinary tract. The urinary tract includes the:  Kidneys.  Ureters.  Bladder.  Urethra. These organs make, store, and get rid of pee (urine) in the body. Follow these instructions at home:  Take over-the-counter and prescription medicines only as told by your doctor.  If you were prescribed an antibiotic medicine, take it as told by your doctor. Do not stop taking the antibiotic even if you start to feel better.  Avoid the following drinks:  Alcohol.  Caffeine.  Tea.  Carbonated drinks.  Drink enough fluid to keep your pee clear or pale yellow.  Keep all follow-up visits as told by your doctor. This is important.  Make sure to:  Empty your bladder often and completely. Do not to hold pee for long periods of time.  Empty your bladder before and after sex.  Wipe from front to back after a bowel movement if you are female. Use each tissue one time when you wipe. Contact a doctor if:  You have back pain.  You have a fever.  You feel sick to your stomach (nauseous).  You throw up (vomit).  Your symptoms do not get better after 3 days.  Your symptoms go away and then come back. Get help right away if:  You have very bad back pain.  You have very bad lower belly (abdominal) pain.  You are throwing up and cannot keep down any medicines or water. This information is not intended to replace advice given to you by your health care provider. Make sure you discuss any questions you have with your health care provider. Document Released: 11/26/2007 Document Revised: 11/15/2015 Document Reviewed: 04/30/2015  2017 Elsevier  

## 2016-05-06 NOTE — Progress Notes (Signed)
   Subjective:    Patient ID: [name], female    DOB: [birth year], 50 y.o.   MRN: [id number]  Hematuria  This is a new problem. The current episode started today. She describes the hematuria as gross hematuria. Her pain is at a severity of 8/10. The pain is mild. Irritative symptoms include frequency and urgency. Associated symptoms include dysuria and flank pain. Pertinent negatives include no chills, fever, nausea or vomiting.      Review of Systems  Constitutional: Negative for chills and fever.  Gastrointestinal: Negative for nausea and vomiting.  Genitourinary: Positive for dysuria, flank pain, frequency, hematuria and urgency.  All other systems reviewed and are negative.      Objective:   Physical Exam  Constitutional: She is oriented to person, place, and time. She appears well-developed and well-nourished. No distress.  Eyes: Pupils are equal, round, and reactive to light.  Cardiovascular: Normal rate, regular rhythm, normal heart sounds and intact distal pulses.   No murmur heard. Pulmonary/Chest: Effort normal and breath sounds normal. No respiratory distress. She has no wheezes.  Abdominal: Soft. Bowel sounds are normal. She exhibits no distension. There is no tenderness.  Musculoskeletal: Normal range of motion. She exhibits no edema or tenderness.  Neurological: She is alert and oriented to person, place, and time.  Skin: Skin is warm and dry.  Psychiatric: She has a normal mood and affect. Her behavior is normal. Judgment and thought content normal.  Vitals reviewed.     BP 125/85   Pulse 99   Temp 97.8 F (36.6 C) (Oral)   Ht 5\' 6"  (1.676 m)   Wt 134 lb 3.2 oz (60.9 kg)   BMI 21.66 kg/m      Assessment & Plan:  1. Hematuria, unspecified type - Urinalysis, Complete  2. Acute cystitis with hematuria -Force fluids AZO over the counter X2 days RTO prn Culture pending - sulfamethoxazole-trimethoprim (BACTRIM DS) 800-160 MG tablet; Take 1 tablet by  mouth 2 (two) times daily.  Dispense: 14 tablet; Refill: 0 - Urine culture  Evelina Dun, FNP

## 2016-05-07 LAB — URINE CULTURE

## 2016-05-14 ENCOUNTER — Ambulatory Visit (INDEPENDENT_AMBULATORY_CARE_PROVIDER_SITE_OTHER): Payer: Medicaid Other | Admitting: Pharmacist

## 2016-05-14 ENCOUNTER — Encounter: Payer: Self-pay | Admitting: Pharmacist

## 2016-05-14 ENCOUNTER — Other Ambulatory Visit: Payer: Self-pay | Admitting: Family

## 2016-05-14 VITALS — Ht 66.0 in | Wt 135.0 lb

## 2016-05-14 DIAGNOSIS — M810 Age-related osteoporosis without current pathological fracture: Secondary | ICD-10-CM

## 2016-05-14 DIAGNOSIS — F17200 Nicotine dependence, unspecified, uncomplicated: Secondary | ICD-10-CM | POA: Diagnosis not present

## 2016-05-14 DIAGNOSIS — G8929 Other chronic pain: Secondary | ICD-10-CM

## 2016-05-14 DIAGNOSIS — M5441 Lumbago with sciatica, right side: Principal | ICD-10-CM

## 2016-05-14 MED ORDER — NICOTINE POLACRILEX 4 MG MT GUM: 4.0000 mg | CHEWING_GUM | OROMUCOSAL | 1 refills | Status: DC | PRN

## 2016-05-14 NOTE — Progress Notes (Signed)
Patient ID: [name], female   DOB: 26-Jun-1965, 50 y.o.   MRN: [id number]  Subjective:     [name] is a 50 y.o. female who was initially scheduled to review her DEXA results however I have already reviewed results on 04/23/2016 and alendroante was started.  She is tolerating well.  [name] would like to discuss smoking cessation today as she is concerned about smoking and its affect on her bones.    She began smoking several years ago. She currently smokes 1 packs per day. She has attempted to quit smoking in the past, most recently 3 years ago.  Barriers to quitting include: fear of weight gain. She denies chest pain, dyspnea on exertion, hemoptysis and shortness of breath.  The following portions of the patient's history were reviewed and updated as appropriate: allergies, current medications, past family history, past medical history, past social history, past surgical history and problem list.   Objective:    Filed Weights   05/14/16 1121  Weight: 135 lb (61.2 kg)   Body mass index is 21.79 kg/m.  Assessment:    Tobacco Use/Cessation.  I assessed [name] to be in an action stage with respect to tobacco use.   osteoporosis   Plan:    I advised patient to quit, and offered support. Brochure to patient. Quit date set for 05/15/2016. Reviewed strategies to maximize success. Commended for decision. Nicotine gum. Discussed triggers and alternatives to address tiggers other than smoking.  Also discussed removing all visual cues to smoking - lighter, ashtrays etc.  Reviewed her motivation to quit which is her grandchildren and better bone health.  I spent approximately 20 minutes counseling the patient.    Cherre Robins, PharmD, CPP

## 2016-05-14 NOTE — Patient Instructions (Signed)
Calcium & Vitamin D: The Facts  Why is calcium and vitamin D consumption important? Calcium: . Most Americans do not consume adequate amounts of calcium! Calcium is required for proper muscle function, nerve communication, bone support, and many other functions in the body.  . The body uses bones as a source of calcium. Bones 'remodel' themselves continuously - the body constantly breaks bone down to release calcium and rebuilds bones by replacing calcium in the bone later.  . As we get older, the rate of bone breakdown occurs faster than bone rebuilding which could lead to osteopenia, osteoporosis, and possible fractures.   Vitamin D: . People naturally make vitamin D in the body when sunlight hits the skin and triggers a process that leads to vitamin D production. This natural vitamin D production requires about 10-15 minutes of sun exposure on the hands, arms, and face at least 2-3 times per week. However, due to decreased sun exposure and the use of sunscreen, most people will need to get additional vitamin D from foods or supplements. Your doctor can measure your body's vitamin D level through a simple blood test to determine your daily vitamin D needs.  . Vitamin D is used to help the body absorb calcium, maintain bone health, help the immune system, and reduce inflammation. It also plays a role in muscle performance, balance and risk of falling.  . Vitamin D deficiency can lead to osteomalacia or softening of the bones, bone pain, and muscle weakness.   The recommended daily allowance of Calcium and Vitamin D varies for different age groups. Age group Calcium (mg) Vitamin D (IU)  Females and Males: Age 55-50 1000 mg 600 IU  Females: Age 28- 86 1200 mg 600 IU  Males: Age 30-70 1000 mg 600 IU  Females and Males: Age 54+ 1200 mg 800 IU  Pregnant/lactating Females age 82-50 1000 mg 600 IU   How much Calcium do you get in your diet? Calcium Intake # of servings per day  Total calcium (mg)   Skim milk, 2% milk (1 cup) _________ x 300 mg   Yogurt (1 small container) _________ x 200 mg   Cheese (1oz) _________ x 200 mg   Cottage Cheese (1 cup)             ________ x 150 mg   Almond milk (1 cup) _________ x 450 mg   Fortified Orange Juice (1 cup) _________ x 300 mg   Broccoli or spinach ( 1 cup) _________ x 100 mg   Salmon (3 oz) _________ x 150 mg    Almonds (1/4 cup) _______ x 90 mg      How do we get Calcium and Vitamin D in our diet? Calcium: . Obtaining calcium from the diet is the most preferred way to reach the recommended daily goal. If this goal is not reached through diet, calcium supplements are available.  . Calcium is found in many foods including: dairy products, dark leafy vegetables (like broccoli, kale, and spinach), fish, and fortified products like juices and cereals.  . The food label will have a %DV (percent daily value) listed showing the amount of calcium per serving. To determine the total mg per serving, simply replace the % with zero (0).  For example, Almond Breeze almond milk contains 45% DV of calcium or 4107m per 1 cup.  . You can increase the amount of calcium in your diet by using more calcium products in your daily meals. Use yogurt and fruit to  make smoothies or use yogurt to top baked potatoes or make whipped potatoes. Sprinkle low fat cheese onto salads or into egg white omelets. You can even add non-fat dry milk powder (300mg  calcium per 1/3 cup) to hot cereals, meat loaf, soups, or potatoes.  . Calcium supplements come in many forms including tablets, chewables, and gummies. Be sure to read the label to determine the correct number of tablets per serving and whether or not to take the supplement with food.  . Calcium carbonate products (Oscal, Caltrate, and Viactiv) are generally better absorbed when taken with food while calcium citrate products like Citracal can be taken with or without food.  . The body can only absorb about 600 mg of calcium  at one time. It is recommended to take calcium supplements in small amounts several times per day.  However, taking it all at once is better than not taking it at all. . Increasing your intake of calcium is essential for bone health, but may also lead to some side effects like constipation, increased gas, bloating or abdominal cramping. To help reduce these side effects, start with 1 tablet per day and slowly increase your intake of the supplement to the recommended doses. It is also recommended that you drink plenty of water each day. Vitamin D: . Very few foods naturally contain vitamin D. However, it is found in saltwater fish (like tuna, salmon and mackerel), beef liver, egg yolks, cheese and vitamin D fortified foods (like yogurt, cereals, orange juice and milk) . Vitamin D is also found in multivitamins and supplements and may be listed as ergocalciferol (vitamin D2) or cholecalciferol (vitamin D3). Each of these forms of vitamin D are equivalent and the daily recommended intake will vary based on your age and the vitamin D levels in your body. Follow your doctor's recommendation for vitamin D intake.

## 2016-05-15 ENCOUNTER — Encounter: Payer: Self-pay | Admitting: Pharmacist

## 2016-05-17 ENCOUNTER — Other Ambulatory Visit: Payer: Self-pay | Admitting: Family

## 2016-06-27 ENCOUNTER — Other Ambulatory Visit: Payer: Self-pay | Admitting: Family

## 2016-06-27 DIAGNOSIS — M5441 Lumbago with sciatica, right side: Principal | ICD-10-CM

## 2016-06-27 DIAGNOSIS — G8929 Other chronic pain: Secondary | ICD-10-CM

## 2016-07-02 ENCOUNTER — Other Ambulatory Visit: Payer: Self-pay | Admitting: Family

## 2016-07-02 DIAGNOSIS — G8929 Other chronic pain: Secondary | ICD-10-CM

## 2016-07-02 DIAGNOSIS — M5441 Lumbago with sciatica, right side: Principal | ICD-10-CM

## 2016-07-24 ENCOUNTER — Other Ambulatory Visit: Payer: Self-pay | Admitting: Family

## 2016-07-24 DIAGNOSIS — M5442 Lumbago with sciatica, left side: Secondary | ICD-10-CM

## 2016-07-25 NOTE — Telephone Encounter (Signed)
Last seen oct and NOV 2017 - [place] please address

## 2016-07-26 ENCOUNTER — Other Ambulatory Visit: Payer: Self-pay | Admitting: Family

## 2016-07-26 DIAGNOSIS — M5442 Lumbago with sciatica, left side: Secondary | ICD-10-CM

## 2016-07-31 ENCOUNTER — Other Ambulatory Visit: Payer: Self-pay | Admitting: Family

## 2016-07-31 DIAGNOSIS — G8929 Other chronic pain: Secondary | ICD-10-CM

## 2016-07-31 DIAGNOSIS — M5441 Lumbago with sciatica, right side: Principal | ICD-10-CM

## 2016-08-04 ENCOUNTER — Other Ambulatory Visit: Payer: Self-pay | Admitting: Family Medicine

## 2016-08-05 ENCOUNTER — Telehealth: Payer: Self-pay

## 2016-08-05 NOTE — Telephone Encounter (Signed)
Nicorelief gum is appropriate replacement, same instructions.

## 2016-08-06 ENCOUNTER — Other Ambulatory Visit: Payer: Self-pay | Admitting: *Deleted

## 2016-08-06 MED ORDER — NICOTINE POLACRILEX 4 MG MT GUM: CHEWING_GUM | OROMUCOSAL | 1 refills | Status: DC

## 2016-08-11 ENCOUNTER — Telehealth: Payer: Self-pay | Admitting: Family

## 2016-08-11 MED ORDER — NICOTINE POLACRILEX 4 MG MT GUM: CHEWING_GUM | OROMUCOSAL | 1 refills | Status: DC

## 2016-08-11 NOTE — Telephone Encounter (Signed)
Prescription sent to pharmacy.

## 2016-09-06 ENCOUNTER — Other Ambulatory Visit: Payer: Self-pay | Admitting: Family

## 2016-09-27 ENCOUNTER — Other Ambulatory Visit: Payer: Self-pay | Admitting: Family

## 2016-09-27 DIAGNOSIS — M5441 Lumbago with sciatica, right side: Principal | ICD-10-CM

## 2016-09-27 DIAGNOSIS — G8929 Other chronic pain: Secondary | ICD-10-CM

## 2016-10-16 ENCOUNTER — Other Ambulatory Visit: Payer: Self-pay | Admitting: Family

## 2016-10-16 DIAGNOSIS — G8929 Other chronic pain: Secondary | ICD-10-CM

## 2016-10-16 DIAGNOSIS — M5441 Lumbago with sciatica, right side: Principal | ICD-10-CM

## 2016-10-20 ENCOUNTER — Encounter: Payer: Self-pay | Admitting: Family

## 2016-10-20 ENCOUNTER — Ambulatory Visit (INDEPENDENT_AMBULATORY_CARE_PROVIDER_SITE_OTHER): Payer: Medicaid Other | Admitting: Family

## 2016-10-20 VITALS — BP 126/90 | HR 87 | Temp 98.5°F | Ht 66.0 in | Wt 128.4 lb

## 2016-10-20 DIAGNOSIS — Z720 Tobacco use: Secondary | ICD-10-CM | POA: Insufficient documentation

## 2016-10-20 DIAGNOSIS — K5900 Constipation, unspecified: Secondary | ICD-10-CM | POA: Diagnosis not present

## 2016-10-20 DIAGNOSIS — F17200 Nicotine dependence, unspecified, uncomplicated: Secondary | ICD-10-CM | POA: Diagnosis not present

## 2016-10-20 DIAGNOSIS — F411 Generalized anxiety disorder: Secondary | ICD-10-CM | POA: Diagnosis not present

## 2016-10-20 DIAGNOSIS — E782 Mixed hyperlipidemia: Secondary | ICD-10-CM

## 2016-10-20 DIAGNOSIS — I10 Essential (primary) hypertension: Secondary | ICD-10-CM

## 2016-10-20 DIAGNOSIS — D509 Iron deficiency anemia, unspecified: Secondary | ICD-10-CM

## 2016-10-20 DIAGNOSIS — M818 Other osteoporosis without current pathological fracture: Secondary | ICD-10-CM

## 2016-10-20 NOTE — Patient Instructions (Signed)
Osteoporosis Osteoporosis happens when your bones become thinner and weaker. Weak bones can break (fracture) more easily when you slip or fall. Bones most at risk of breaking are in the hip, wrist, and spine. Follow these instructions at home:  Get enough calcium and vitamin D. These nutrients are good for your bones.  Exercise as told by your doctor.  Do not use any tobacco products. This includes cigarettes, chewing tobacco, and electronic cigarettes. If you need help quitting, ask your doctor.  Limit the amount of alcohol you drink.  Take medicines only as told by your doctor.  Keep all follow-up visits as told by your doctor. This is important.  Take care at home to prevent falls. Some ways to do this are:  Keep rooms well lit and tidy.  Put safety rails on your stairs.  Put a rubber mat in the bathroom and other places that are often wet or slippery. Get help right away if:  You fall.  You hurt yourself. This information is not intended to replace advice given to you by your health care provider. Make sure you discuss any questions you have with your health care provider. Document Released: 09/01/2011 Document Revised: 11/15/2015 Document Reviewed: 11/17/2013 Elsevier Interactive Patient Education  2017 [employer]  

## 2016-10-20 NOTE — Progress Notes (Signed)
Subjective:    Patient ID: [name], female    DOB: 29-May-1966, 51 y.o.   MRN: [id number]  Pt presents to the office today  Anxiety  Presents for follow-up visit. Symptoms include decreased concentration, excessive worry, irritability and nervous/anxious behavior. Patient reports no shortness of breath. Symptoms occur occasionally.   Her past medical history is significant for anemia.  Hyperlipidemia  This is a chronic problem. The current episode started more than 1 year ago. The problem is controlled. Recent lipid tests were reviewed and are normal. Pertinent negatives include no shortness of breath. Current antihyperlipidemic treatment includes statins. The current treatment provides moderate improvement of lipids.  Hypertension  This is a chronic problem. The current episode started more than 1 year ago. The problem has been resolved since onset. The problem is uncontrolled. Associated symptoms include anxiety. Pertinent negatives include no malaise/fatigue, peripheral edema or shortness of breath. The current treatment provides moderate improvement.  Constipation  This is a chronic problem. The current episode started more than 1 year ago. The problem has been resolved since onset. Her stool frequency is 1 time per day. She has tried stool softeners for the symptoms. The treatment provided moderate relief.  Anemia  Presents for follow-up visit. There has been no bruising/bleeding easily, malaise/fatigue or paresthesias.  Osteoporosis  PT currently taking Fosamax 70 mg every week. Pt's last dexa scan on 04/22/16.   Review of Systems  Constitutional: Positive for irritability. Negative for malaise/fatigue.  Respiratory: Negative for shortness of breath.   Gastrointestinal: Positive for constipation.  Neurological: Negative for paresthesias.  Hematological: Does not bruise/bleed easily.  Psychiatric/Behavioral: Positive for decreased concentration. The patient is nervous/anxious.    All other systems reviewed and are negative.      Objective:   Physical Exam  Constitutional: She is oriented to person, place, and time. She appears well-developed and well-nourished. No distress.  HENT:  Head: Normocephalic and atraumatic.  Right Ear: External ear normal.  Left Ear: External ear normal.  Mouth/Throat: Oropharynx is clear and moist.  Eyes: Pupils are equal, round, and reactive to light.  Neck: Normal range of motion. Neck supple. No thyromegaly present.  Cardiovascular: Normal rate, regular rhythm, normal heart sounds and intact distal pulses.   No murmur heard. Pulmonary/Chest: Effort normal and breath sounds normal. No respiratory distress. She has no wheezes.  Abdominal: Soft. Bowel sounds are normal. She exhibits no distension. There is no tenderness.  Musculoskeletal: Normal range of motion. She exhibits no edema or tenderness.  Neurological: She is alert and oriented to person, place, and time. She has normal reflexes. No cranial nerve deficit.  Skin: Skin is warm and dry. There is pallor.  Psychiatric: She has a normal mood and affect. Her behavior is normal. Judgment and thought content normal.  Vitals reviewed.     BP 126/90   Pulse 87   Temp 98.5 F (36.9 C) (Oral)   Ht '5\' 6"'  (1.676 m)   Wt 128 lb 6.4 oz (58.2 kg)   BMI 20.72 kg/m      Assessment & Plan:  1. Essential hypertension, benign - CMP14+EGFR  2. Constipation, unspecified constipation type - CMP14+EGFR  3. Other osteoporosis, unspecified pathological fracture presence - CMP14+EGFR  4. GAD (generalized anxiety disorder) - CMP14+EGFR  5. Mixed hyperlipidemia - CMP14+EGFR - Lipid panel  6. Current smoker - CMP14+EGFR -Smoking cessation discussed  7. Iron deficiency anemia, unspecified iron deficiency anemia type - CMP14+EGFR - Anemia Profile B   Continue all  meds Labs pending Health Maintenance reviewed Diet and exercise encouraged RTO 6 months   Evelina Dun,  FNP

## 2016-10-21 LAB — ANEMIA PROFILE B
BASOS ABS: 0 10*3/uL (ref 0.0–0.2)
Basos: 0 %
EOS (ABSOLUTE): 0.2 10*3/uL (ref 0.0–0.4)
Eos: 3 %
Ferritin: 724 ng/mL — ABNORMAL HIGH (ref 15–150)
Folate: 8.9 ng/mL (ref >3.0)
Hematocrit: 36.6 % (ref 34.0–46.6)
Hemoglobin: 12.1 g/dL (ref 11.1–15.9)
IMMATURE GRANS (ABS): 0 10*3/uL (ref 0.0–0.1)
IMMATURE GRANULOCYTES: 0 %
Iron Saturation: 42 % (ref 15–55)
Iron: 93 ug/dL (ref 27–159)
LYMPHS: 41 %
Lymphocytes Absolute: 2.7 10*3/uL (ref 0.7–3.1)
MCH: 31 pg (ref 26.6–33.0)
MCHC: 33.1 g/dL (ref 31.5–35.7)
MCV: 94 fL (ref 79–97)
MONOS ABS: 0.7 10*3/uL (ref 0.1–0.9)
Monocytes: 11 %
NEUTROS PCT: 45 %
Neutrophils Absolute: 3 10*3/uL (ref 1.4–7.0)
Platelets: 293 10*3/uL (ref 150–379)
RBC: 3.9 x10E6/uL (ref 3.77–5.28)
RDW: 13.1 % (ref 12.3–15.4)
Retic Ct Pct: 1.2 % (ref 0.6–2.6)
Total Iron Binding Capacity: 220 ug/dL — ABNORMAL LOW (ref 250–450)
UIBC: 127 ug/dL — ABNORMAL LOW (ref 131–425)
Vitamin B-12: 716 pg/mL (ref 232–1245)
WBC: 6.7 10*3/uL (ref 3.4–10.8)

## 2016-10-21 LAB — CMP14+EGFR
ALT: 15 IU/L (ref 0–32)
AST: 22 IU/L (ref 0–40)
Albumin/Globulin Ratio: 2 (ref 1.2–2.2)
Albumin: 4.3 g/dL (ref 3.5–5.5)
Alkaline Phosphatase: 46 IU/L (ref 39–117)
BUN/Creatinine Ratio: 13 (ref 9–23)
BUN: 9 mg/dL (ref 6–24)
Bilirubin Total: <0.2 mg/dL (ref 0.0–1.2)
CALCIUM: 9.3 mg/dL (ref 8.7–10.2)
CO2: 26 mmol/L (ref 18–29)
CREATININE: 0.69 mg/dL (ref 0.57–1.00)
Chloride: 103 mmol/L (ref 96–106)
GFR calc Af Amer: 117 mL/min/{1.73_m2} (ref >59)
GFR, EST NON AFRICAN AMERICAN: 101 mL/min/{1.73_m2} (ref >59)
Globulin, Total: 2.1 g/dL (ref 1.5–4.5)
Glucose: 84 mg/dL (ref 65–99)
Potassium: 4.2 mmol/L (ref 3.5–5.2)
Sodium: 144 mmol/L (ref 134–144)
Total Protein: 6.4 g/dL (ref 6.0–8.5)

## 2016-10-21 LAB — LIPID PANEL
CHOLESTEROL TOTAL: 146 mg/dL (ref 100–199)
Chol/HDL Ratio: 2.2 ratio (ref 0.0–4.4)
HDL: 65 mg/dL (ref >39)
LDL CALC: 65 mg/dL (ref 0–99)
TRIGLYCERIDES: 82 mg/dL (ref 0–149)
VLDL CHOLESTEROL CAL: 16 mg/dL (ref 5–40)

## 2016-10-26 ENCOUNTER — Other Ambulatory Visit: Payer: Self-pay | Admitting: Family

## 2016-10-26 DIAGNOSIS — M5441 Lumbago with sciatica, right side: Principal | ICD-10-CM

## 2016-10-26 DIAGNOSIS — G8929 Other chronic pain: Secondary | ICD-10-CM

## 2016-10-29 ENCOUNTER — Telehealth: Payer: Self-pay | Admitting: *Deleted

## 2016-10-29 ENCOUNTER — Telehealth: Payer: Self-pay | Admitting: Family

## 2016-10-29 DIAGNOSIS — M5441 Lumbago with sciatica, right side: Principal | ICD-10-CM

## 2016-10-29 DIAGNOSIS — G8929 Other chronic pain: Secondary | ICD-10-CM

## 2016-10-29 MED ORDER — TRAMADOL HCL 50 MG PO TABS: 50.0000 mg | ORAL_TABLET | Freq: Four times a day (QID) | ORAL | 5 refills | Status: DC | PRN

## 2016-10-29 NOTE — Telephone Encounter (Signed)
What is the name of the medication? tramadol  Have you contacted your pharmacy to request a refill? yes  Which pharmacy would you like [facility] sent [facility]   Patient notified that their request is being sent to the clinical staff for review and that they should receive a call once it is complete. If they do not receive a call within 24 hours they can check with their pharmacy or our office.

## 2016-10-29 NOTE — Telephone Encounter (Signed)
Please call in RX for pt. Thanks!

## 2016-10-29 NOTE — Telephone Encounter (Signed)
Aware ,[facility].

## 2016-11-23 ENCOUNTER — Other Ambulatory Visit: Payer: Self-pay | Admitting: Family

## 2016-11-23 DIAGNOSIS — G8929 Other chronic pain: Secondary | ICD-10-CM

## 2016-11-23 DIAGNOSIS — M5441 Lumbago with sciatica, right side: Principal | ICD-10-CM

## 2016-12-28 ENCOUNTER — Other Ambulatory Visit: Payer: Self-pay | Admitting: Family

## 2017-01-01 ENCOUNTER — Other Ambulatory Visit: Payer: Self-pay | Admitting: Family

## 2017-01-04 ENCOUNTER — Other Ambulatory Visit: Payer: Self-pay | Admitting: Family

## 2017-01-12 ENCOUNTER — Other Ambulatory Visit: Payer: Self-pay | Admitting: Family

## 2017-01-12 DIAGNOSIS — M5441 Lumbago with sciatica, right side: Principal | ICD-10-CM

## 2017-01-12 DIAGNOSIS — G8929 Other chronic pain: Secondary | ICD-10-CM

## 2017-02-08 ENCOUNTER — Other Ambulatory Visit: Payer: Self-pay | Admitting: Family

## 2017-02-08 DIAGNOSIS — G8929 Other chronic pain: Secondary | ICD-10-CM

## 2017-02-08 DIAGNOSIS — M5441 Lumbago with sciatica, right side: Principal | ICD-10-CM

## 2017-02-24 ENCOUNTER — Other Ambulatory Visit: Payer: Self-pay | Admitting: Family

## 2017-02-25 NOTE — Telephone Encounter (Signed)
Next OV 03/2017

## 2017-03-03 ENCOUNTER — Other Ambulatory Visit: Payer: Self-pay | Admitting: *Deleted

## 2017-03-03 MED ORDER — ALENDRONATE SODIUM 70 MG PO TABS: 70.0000 mg | ORAL_TABLET | ORAL | 11 refills | Status: DC

## 2017-03-16 ENCOUNTER — Encounter: Payer: Self-pay | Admitting: Family

## 2017-03-16 ENCOUNTER — Ambulatory Visit (INDEPENDENT_AMBULATORY_CARE_PROVIDER_SITE_OTHER): Payer: Medicaid Other | Admitting: Family

## 2017-03-16 VITALS — BP 139/89 | HR 83 | Temp 98.1°F | Ht 66.0 in | Wt 126.2 lb

## 2017-03-16 DIAGNOSIS — N3001 Acute cystitis with hematuria: Secondary | ICD-10-CM

## 2017-03-16 DIAGNOSIS — R399 Unspecified symptoms and signs involving the genitourinary system: Secondary | ICD-10-CM | POA: Diagnosis not present

## 2017-03-16 LAB — URINALYSIS, COMPLETE
Bilirubin, UA: NEGATIVE
GLUCOSE, UA: NEGATIVE
Ketones, UA: NEGATIVE
NITRITE UA: NEGATIVE
Protein, UA: NEGATIVE
Specific Gravity, UA: <1.005 — ABNORMAL LOW (ref 1.005–1.030)
Urobilinogen, Ur: 0.2 mg/dL (ref 0.2–1.0)
pH, UA: 6 (ref 5.0–7.5)

## 2017-03-16 LAB — MICROSCOPIC EXAMINATION

## 2017-03-16 MED ORDER — CIPROFLOXACIN HCL 500 MG PO TABS: 500.0000 mg | ORAL_TABLET | Freq: Two times a day (BID) | ORAL | 0 refills | Status: DC

## 2017-03-16 NOTE — Patient Instructions (Signed)

## 2017-03-16 NOTE — Progress Notes (Signed)
   Subjective:    Patient ID: [name], female    DOB: [birth year], 51 y.o.   MRN: [id number]  Hematuria  This is a new problem. The current episode started yesterday. The problem is unchanged. Her pain is at a severity of 2/10. The pain is mild. She describes her urine color as yellow. Irritative symptoms include frequency, nocturia and urgency. Obstructive symptoms include straining. Associated symptoms include dysuria. Pertinent negatives include no flank pain or vomiting.      Review of Systems  Gastrointestinal: Negative for vomiting.  Genitourinary: Positive for dysuria, frequency, hematuria, nocturia and urgency. Negative for flank pain.  All other systems reviewed and are negative.      Objective:   Physical Exam  Constitutional: She is oriented to person, place, and time. She appears well-developed and well-nourished. No distress.  HENT:  Head: Normocephalic.  Cardiovascular: Normal rate, regular rhythm, normal heart sounds and intact distal pulses.   No murmur heard. Pulmonary/Chest: Effort normal and breath sounds normal. No respiratory distress. She has no wheezes.  Abdominal: Soft. Bowel sounds are normal. She exhibits no distension. There is tenderness. Rebound: mild lower tenderness.  Musculoskeletal: Normal range of motion. She exhibits no edema or tenderness.  Neurological: She is alert and oriented to person, place, and time.  Skin: Skin is warm and dry.  Psychiatric: She has a normal mood and affect. Her behavior is normal. Judgment and thought content normal.  Vitals reviewed.     BP 139/89   Pulse 83   Temp 98.1 F (36.7 C) (Oral)   Ht 5\' 6"  (1.676 m)   Wt 126 lb 3.2 oz (57.2 kg)   BMI 20.37 kg/m      Assessment & Plan:  1. UTI symptoms - Urinalysis, Complete  2. Acute cystitis with hematuria Force fluids AZO over the counter X2 days RTO prn Culture pending - ciprofloxacin (CIPRO) 500 MG tablet; Take 1 tablet (500 mg total) by mouth 2  (two) times daily.  Dispense: 14 tablet; Refill: 0 - Urine Culture   Evelina Dun, FNP

## 2017-03-18 LAB — URINE CULTURE

## 2017-03-24 ENCOUNTER — Other Ambulatory Visit: Payer: Self-pay | Admitting: Family

## 2017-03-24 DIAGNOSIS — M5441 Lumbago with sciatica, right side: Principal | ICD-10-CM

## 2017-03-24 DIAGNOSIS — G8929 Other chronic pain: Secondary | ICD-10-CM

## 2017-04-02 ENCOUNTER — Other Ambulatory Visit: Payer: Self-pay | Admitting: Family

## 2017-04-21 ENCOUNTER — Ambulatory Visit (INDEPENDENT_AMBULATORY_CARE_PROVIDER_SITE_OTHER): Payer: Medicaid Other | Admitting: Family

## 2017-04-21 ENCOUNTER — Encounter: Payer: Self-pay | Admitting: Family

## 2017-04-21 VITALS — BP 135/91 | HR 99 | Temp 98.2°F | Ht 66.0 in | Wt 125.2 lb

## 2017-04-21 DIAGNOSIS — G8929 Other chronic pain: Secondary | ICD-10-CM | POA: Diagnosis not present

## 2017-04-21 DIAGNOSIS — F17200 Nicotine dependence, unspecified, uncomplicated: Secondary | ICD-10-CM | POA: Diagnosis not present

## 2017-04-21 DIAGNOSIS — Z1211 Encounter for screening for malignant neoplasm of colon: Secondary | ICD-10-CM

## 2017-04-21 DIAGNOSIS — F411 Generalized anxiety disorder: Secondary | ICD-10-CM | POA: Diagnosis not present

## 2017-04-21 DIAGNOSIS — M5441 Lumbago with sciatica, right side: Secondary | ICD-10-CM

## 2017-04-21 DIAGNOSIS — M545 Low back pain: Secondary | ICD-10-CM

## 2017-04-21 DIAGNOSIS — E782 Mixed hyperlipidemia: Secondary | ICD-10-CM | POA: Diagnosis not present

## 2017-04-21 DIAGNOSIS — D509 Iron deficiency anemia, unspecified: Secondary | ICD-10-CM | POA: Diagnosis not present

## 2017-04-21 DIAGNOSIS — M549 Dorsalgia, unspecified: Secondary | ICD-10-CM

## 2017-04-21 DIAGNOSIS — K5900 Constipation, unspecified: Secondary | ICD-10-CM

## 2017-04-21 DIAGNOSIS — Z23 Encounter for immunization: Secondary | ICD-10-CM | POA: Diagnosis not present

## 2017-04-21 DIAGNOSIS — Z716 Tobacco abuse counseling: Secondary | ICD-10-CM | POA: Diagnosis not present

## 2017-04-21 DIAGNOSIS — I10 Essential (primary) hypertension: Secondary | ICD-10-CM

## 2017-04-21 MED ORDER — TRAMADOL HCL 50 MG PO TABS: 50.0000 mg | ORAL_TABLET | Freq: Four times a day (QID) | ORAL | 5 refills | Status: DC | PRN

## 2017-04-21 MED ORDER — NAPROXEN 500 MG PO TABS: 500.0000 mg | ORAL_TABLET | Freq: Two times a day (BID) | ORAL | 0 refills | Status: DC

## 2017-04-21 MED ORDER — VARENICLINE TARTRATE 1 MG PO TABS: 1.0000 mg | ORAL_TABLET | Freq: Two times a day (BID) | ORAL | 2 refills | Status: DC

## 2017-04-21 MED ORDER — CYCLOBENZAPRINE HCL 5 MG PO TABS: ORAL_TABLET | ORAL | 2 refills | Status: DC

## 2017-04-21 MED ORDER — FLUTICASONE PROPIONATE 50 MCG/ACT NA SUSP: 2.0000 | Freq: Every day | NASAL | 6 refills | Status: DC

## 2017-04-21 MED ORDER — SIMETHICONE 125 MG PO CAPS: 125.0000 mg | ORAL_CAPSULE | Freq: Every day | ORAL | 0 refills | Status: DC

## 2017-04-21 MED ORDER — VARENICLINE TARTRATE 0.5 MG X 11 & 1 MG X 42 PO MISC: ORAL | 0 refills | Status: DC

## 2017-04-21 NOTE — Patient Instructions (Signed)
Steps to Quit Smoking Smoking tobacco can be bad for your health. It can also affect almost every organ in your body. Smoking puts you and people around you at risk for many serious long-lasting (chronic) diseases. Quitting smoking is hard, but it is one of the best things that you can do for your health. It is never too late to quit. What are the benefits of quitting smoking? When you quit smoking, you lower your risk for getting serious diseases and conditions. They can include:  Lung cancer or lung disease.  Heart disease.  Stroke.  Heart attack.  Not being able to have children (infertility).  Weak bones (osteoporosis) and broken bones (fractures).  If you have coughing, wheezing, and shortness of breath, those symptoms may get better when you quit. You may also get sick less often. If you are pregnant, quitting smoking can help to lower your chances of having a baby of low birth weight. What can I do to help me quit smoking? Talk with your doctor about what can help you quit smoking. Some things you can do (strategies) include:  Quitting smoking totally, instead of slowly cutting back how much you smoke over a period of time.  Going to in-person counseling. You are more likely to quit if you go to many counseling sessions.  Using resources and support systems, such as: ? Online chats with a counselor. ? Phone quitlines. ? Printed self-help materials. ? Support groups or group counseling. ? Text messaging programs. ? Mobile phone apps or applications.  Taking medicines. Some of these medicines may have nicotine in them. If you are pregnant or breastfeeding, do not take any medicines to quit smoking unless your doctor says it is okay. Talk with your doctor about counseling or other things that can help you.  Talk with your doctor about using more than one strategy at the same time, such as taking medicines while you are also going to in-person counseling. This can help make  quitting easier. What things can I do to make it easier to quit? Quitting smoking might feel very hard at first, but there is a lot that you can do to make it easier. Take these steps:  Talk to your family and friends. Ask them to support and encourage you.  Call phone quitlines, reach out to support groups, or work with a counselor.  Ask people who smoke to not smoke around you.  Avoid places that make you want (trigger) to smoke, such as: ? Bars. ? Parties. ? Smoke-break areas at work.  Spend time with people who do not smoke.  Lower the stress in your life. Stress can make you want to smoke. Try these things to help your stress: ? Getting regular exercise. ? Deep-breathing exercises. ? Yoga. ? Meditating. ? Doing a body scan. To do this, close your eyes, focus on one area of your body at a time from head to toe, and notice which parts of your body are tense. Try to relax the muscles in those areas.  Download or buy apps on your mobile phone or tablet that can help you stick to your quit plan. There are many free apps, such as QuitGuide from the CDC (Centers for Disease Control and Prevention). You can find more support from smokefree.gov and other websites.  This information is not intended to replace advice given to you by your health care provider. Make sure you discuss any questions you have with your health care provider. Document Released: 04/05/2009 Document   Revised: 02/05/2016 Document Reviewed: 10/24/2014 Elsevier Interactive Patient Education  2018 Elsevier Inc.  

## 2017-04-21 NOTE — Progress Notes (Signed)
Subjective:    Patient ID: [name], female    DOB: 1966/06/05, 51 y.o.   MRN: [id number]  Pt presents to the office today for chronic follow up.  Hyperlipidemia  This is a chronic problem. The current episode started more than 1 year ago. The problem is controlled. Recent lipid tests were reviewed and are normal. Pertinent negatives include no shortness of breath. Current antihyperlipidemic treatment includes statins. The current treatment provides moderate improvement of lipids. Risk factors for coronary artery disease include dyslipidemia, post-menopausal and family history.  Depression         This is a chronic problem.  The current episode started more than 1 year ago.   The onset quality is gradual.   The problem occurs every several days.  The problem has been waxing and waning since onset.  Associated symptoms include no fatigue, no helplessness, no hopelessness, not irritable, no headaches and not sad.  Past treatments include SSRIs - Selective serotonin reuptake inhibitors. Hypertension  This is a chronic problem. The current episode started more than 1 year ago. The problem has been resolved since onset. The problem is controlled. Pertinent negatives include no headaches, malaise/fatigue, peripheral edema or shortness of breath. Risk factors for coronary artery disease include dyslipidemia, smoking/tobacco exposure and family history. The current treatment provides moderate improvement. There is no history of kidney disease, CAD/MI or heart failure.  Anemia  Presents for follow-up visit. There has been no bruising/bleeding easily, leg swelling, light-headedness or malaise/fatigue. There is no history of heart failure.  Constipation  This is a chronic problem. The current episode started more than 1 year ago. The problem has been resolved since onset. Her stool frequency is 1 time per day. Associated symptoms include back pain. She has tried laxatives for the symptoms. The treatment  provided moderate relief.  Back Pain  This is a chronic problem. The current episode started more than 1 year ago. The problem occurs intermittently. The problem is unchanged. The pain is present in the lumbar spine. The quality of the pain is described as aching. The symptoms are aggravated by bending. Pertinent negatives include no bladder incontinence, bowel incontinence or headaches. She has tried analgesics and bed rest for the symptoms. The treatment provided mild relief.  Smoker PT states she is smoking 8-12 cigarettes a day and has tried cutting back, but can not quit.      Review of Systems  Constitutional: Negative for fatigue and malaise/fatigue.  Respiratory: Negative for shortness of breath.   Gastrointestinal: Positive for constipation. Negative for bowel incontinence.  Genitourinary: Negative for bladder incontinence.  Musculoskeletal: Positive for back pain.  Neurological: Negative for light-headedness and headaches.  Hematological: Does not bruise/bleed easily.  Psychiatric/Behavioral: Positive for depression.  All other systems reviewed and are negative.      Objective:   Physical Exam  Constitutional: She is oriented to person, place, and time. She appears well-developed and well-nourished. She is not irritable. No distress.  HENT:  Head: Normocephalic and atraumatic.  Right Ear: External ear normal.  Left Ear: External ear normal.  Mouth/Throat: Oropharynx is clear and moist. Abnormal dentition.  Eyes: Pupils are equal, round, and reactive to light.  Neck: Normal range of motion. Neck supple. No thyromegaly present.  Cardiovascular: Normal rate, regular rhythm, normal heart sounds and intact distal pulses.   No murmur heard. Pulmonary/Chest: Effort normal and breath sounds normal. No respiratory distress. She has no wheezes.  Abdominal: Soft. Bowel sounds are normal. She exhibits  no distension. There is no tenderness.  Musculoskeletal: Normal range of motion.  She exhibits no edema or tenderness.  Neurological: She is alert and oriented to person, place, and time.  Skin: Skin is warm and dry.  Psychiatric: She has a normal mood and affect. Her behavior is normal. Judgment and thought content normal.  Vitals reviewed.   BP (!) 135/91   Pulse 99   Temp 98.2 F (36.8 C) (Oral)   Ht '5\' 6"'  (1.676 m)   Wt 125 lb 3.2 oz (56.8 kg)   BMI 20.21 kg/m      Assessment & Plan:  1. GAD (generalized anxiety disorder) - CMP14+EGFR  2. Mixed hyperlipidemia - CMP14+EGFR - Lipid panel  3. Essential hypertension, benign - CMP14+EGFR  4. Iron deficiency anemia, unspecified iron deficiency anemia type  - Anemia Profile B - CMP14+EGFR  5. Current smoker - CMP14+EGFR  6. Constipation, unspecified constipation type  - CMP14+EGFR  7. Chronic bilateral low back pain without sciatica - CMP14+EGFR - traMADol (ULTRAM) 50 MG tablet; Take 1 tablet (50 mg total) by mouth every 6 (six) hours as needed.  Dispense: 60 tablet; Refill: 5  8. Chronic right-sided low back pain with right-sided sciatica - CMP14+EGFR - traMADol (ULTRAM) 50 MG tablet; Take 1 tablet (50 mg total) by mouth every 6 (six) hours as needed.  Dispense: 60 tablet; Refill: 5 - cyclobenzaprine (FLEXERIL) 5 MG tablet; TAKE 1 TABLET BY MOUTH THREE TIMES A DAY AS NEEDED FOR MUSCLE SPASMS  Dispense: 60 tablet; Refill: 2 - naproxen (NAPROSYN) 500 MG tablet; Take 1 tablet (500 mg total) by mouth 2 (two) times daily with a meal.  Dispense: 60 tablet; Refill: 0  9. Colon cancer screening - CMP14+EGFR - Fecal occult blood, imunochemical; Future  10. Encounter for smoking cessation counseling Discussed stop smoking in 8-35 days Discussed stop medication if having any thoughts of harming herself or others - varenicline (CHANTIX STARTING MONTH PAK) 0.5 MG X 11 & 1 MG X 42 tablet; Take one 0.5 mg tablet by mouth once daily for 3 days, then increase to one 0.5 mg tablet twice daily for 4 days, then  increase to one 1 mg tablet twice daily.  Dispense: 53 tablet; Refill: 0 - varenicline (CHANTIX CONTINUING MONTH PAK) 1 MG tablet; Take 1 tablet (1 mg total) by mouth 2 (two) times daily.  Dispense: 30 tablet; Refill: 2   Continue all meds Labs pending Health Maintenance reviewed Diet and exercise encouraged RTO 3 months   Evelina Dun, FNP

## 2017-04-22 ENCOUNTER — Other Ambulatory Visit: Payer: Medicaid Other

## 2017-04-22 DIAGNOSIS — Z1211 Encounter for screening for malignant neoplasm of colon: Secondary | ICD-10-CM

## 2017-04-22 LAB — CMP14+EGFR
A/G RATIO: 1.9 (ref 1.2–2.2)
ALBUMIN: 4.5 g/dL (ref 3.5–5.5)
ALT: 13 IU/L (ref 0–32)
AST: 15 IU/L (ref 0–40)
Alkaline Phosphatase: 47 IU/L (ref 39–117)
BUN / CREAT RATIO: 14 (ref 9–23)
BUN: 10 mg/dL (ref 6–24)
CHLORIDE: 100 mmol/L (ref 96–106)
CO2: 28 mmol/L (ref 20–29)
Calcium: 9.5 mg/dL (ref 8.7–10.2)
Creatinine, Ser: 0.72 mg/dL (ref 0.57–1.00)
GFR calc non Af Amer: 97 mL/min/{1.73_m2} (ref >59)
GFR, EST AFRICAN AMERICAN: 112 mL/min/{1.73_m2} (ref >59)
GLUCOSE: 92 mg/dL (ref 65–99)
Globulin, Total: 2.4 g/dL (ref 1.5–4.5)
Potassium: 4.1 mmol/L (ref 3.5–5.2)
Sodium: 141 mmol/L (ref 134–144)
TOTAL PROTEIN: 6.9 g/dL (ref 6.0–8.5)

## 2017-04-22 LAB — ANEMIA PROFILE B
BASOS: 0 %
Basophils Absolute: 0 10*3/uL (ref 0.0–0.2)
EOS (ABSOLUTE): 0.2 10*3/uL (ref 0.0–0.4)
EOS: 3 %
FERRITIN: 618 ng/mL — AB (ref 15–150)
Folate: 12.3 ng/mL (ref >3.0)
HEMATOCRIT: 37.8 % (ref 34.0–46.6)
Hemoglobin: 12.8 g/dL (ref 11.1–15.9)
IRON SATURATION: 40 % (ref 15–55)
IRON: 100 ug/dL (ref 27–159)
Immature Grans (Abs): 0 10*3/uL (ref 0.0–0.1)
Immature Granulocytes: 0 %
Lymphocytes Absolute: 2.7 10*3/uL (ref 0.7–3.1)
Lymphs: 34 %
MCH: 31.5 pg (ref 26.6–33.0)
MCHC: 33.9 g/dL (ref 31.5–35.7)
MCV: 93 fL (ref 79–97)
MONOCYTES: 12 %
Monocytes Absolute: 1 10*3/uL — ABNORMAL HIGH (ref 0.1–0.9)
NEUTROS ABS: 4 10*3/uL (ref 1.4–7.0)
Neutrophils: 51 %
Platelets: 297 10*3/uL (ref 150–379)
RBC: 4.06 x10E6/uL (ref 3.77–5.28)
RDW: 14 % (ref 12.3–15.4)
RETIC CT PCT: 1 % (ref 0.6–2.6)
TIBC: 247 ug/dL — AB (ref 250–450)
UIBC: 147 ug/dL (ref 131–425)
VITAMIN B 12: 547 pg/mL (ref 232–1245)
WBC: 7.9 10*3/uL (ref 3.4–10.8)

## 2017-04-22 LAB — LIPID PANEL
Chol/HDL Ratio: 2.3 ratio (ref 0.0–4.4)
Cholesterol, Total: 145 mg/dL (ref 100–199)
HDL: 63 mg/dL (ref >39)
LDL Calculated: 58 mg/dL (ref 0–99)
Triglycerides: 122 mg/dL (ref 0–149)
VLDL CHOLESTEROL CAL: 24 mg/dL (ref 5–40)

## 2017-04-26 LAB — FECAL OCCULT BLOOD, IMMUNOCHEMICAL: FECAL OCCULT BLD: NEGATIVE

## 2017-05-16 ENCOUNTER — Other Ambulatory Visit: Payer: Self-pay | Admitting: Family

## 2017-05-16 DIAGNOSIS — G8929 Other chronic pain: Secondary | ICD-10-CM

## 2017-05-16 DIAGNOSIS — M545 Low back pain: Secondary | ICD-10-CM

## 2017-05-16 DIAGNOSIS — M5441 Lumbago with sciatica, right side: Principal | ICD-10-CM

## 2017-05-25 ENCOUNTER — Telehealth: Payer: Self-pay | Admitting: Family

## 2017-05-25 NOTE — Telephone Encounter (Signed)
[facility] [place] [facility]  traMADol (ULTRAM) 50 MG tablet 60 tablet 5 04/21/2017   Was expired. Pt was just seen 04/21/17 and given the RX with Refills. Please advise.

## 2017-05-25 NOTE — Telephone Encounter (Signed)
Rx was printed at last appt with 5 RFs. Patient states that she took to pharmacy but [facility] and they state that she doesn't have any refills and they need authorization. Please advise

## 2017-05-25 NOTE — Telephone Encounter (Signed)
Yes this is fine. [facility] verify [facility].

## 2017-05-25 NOTE — Telephone Encounter (Signed)
[facility]. Patient notified

## 2017-07-03 ENCOUNTER — Other Ambulatory Visit: Payer: Self-pay | Admitting: Family

## 2017-07-06 NOTE — Telephone Encounter (Signed)
OV 07/24/17

## 2017-07-11 ENCOUNTER — Other Ambulatory Visit: Payer: Self-pay | Admitting: Family

## 2017-07-21 ENCOUNTER — Other Ambulatory Visit: Payer: Self-pay | Admitting: Family

## 2017-07-21 DIAGNOSIS — G8929 Other chronic pain: Secondary | ICD-10-CM

## 2017-07-21 DIAGNOSIS — M5441 Lumbago with sciatica, right side: Principal | ICD-10-CM

## 2017-07-24 ENCOUNTER — Ambulatory Visit: Payer: Medicaid Other | Admitting: Family

## 2017-08-22 ENCOUNTER — Other Ambulatory Visit: Payer: Self-pay | Admitting: Family

## 2017-08-24 NOTE — Telephone Encounter (Signed)
Last seen 04/21/17  [facility]

## 2017-08-25 ENCOUNTER — Other Ambulatory Visit: Payer: Self-pay | Admitting: Family

## 2017-08-25 DIAGNOSIS — M5442 Lumbago with sciatica, left side: Secondary | ICD-10-CM

## 2017-08-26 NOTE — Telephone Encounter (Signed)
OV 10/15/17

## 2017-09-18 ENCOUNTER — Other Ambulatory Visit: Payer: Self-pay | Admitting: Family

## 2017-09-18 DIAGNOSIS — M545 Low back pain: Secondary | ICD-10-CM

## 2017-09-18 DIAGNOSIS — M5441 Lumbago with sciatica, right side: Principal | ICD-10-CM

## 2017-09-18 DIAGNOSIS — G8929 Other chronic pain: Secondary | ICD-10-CM

## 2017-10-05 ENCOUNTER — Telehealth: Payer: Self-pay | Admitting: Family

## 2017-10-05 ENCOUNTER — Other Ambulatory Visit: Payer: Self-pay | Admitting: Family

## 2017-10-05 DIAGNOSIS — M5441 Lumbago with sciatica, right side: Principal | ICD-10-CM

## 2017-10-05 DIAGNOSIS — G8929 Other chronic pain: Secondary | ICD-10-CM

## 2017-10-05 DIAGNOSIS — M545 Low back pain, unspecified: Secondary | ICD-10-CM

## 2017-10-05 MED ORDER — CYCLOBENZAPRINE HCL 5 MG PO TABS: 5.0000 mg | ORAL_TABLET | Freq: Two times a day (BID) | ORAL | 2 refills | Status: DC | PRN

## 2017-10-05 MED ORDER — TRAMADOL HCL 50 MG PO TABS: 50.0000 mg | ORAL_TABLET | Freq: Four times a day (QID) | ORAL | 5 refills | Status: DC | PRN

## 2017-10-05 NOTE — Telephone Encounter (Signed)
Prescriptions sent to pharmacy

## 2017-10-10 ENCOUNTER — Other Ambulatory Visit: Payer: Self-pay | Admitting: Physician Assistant

## 2017-10-10 ENCOUNTER — Other Ambulatory Visit: Payer: Self-pay | Admitting: Family

## 2017-10-12 NOTE — Telephone Encounter (Signed)
Last seen 04/21/17  [facility]

## 2017-10-12 NOTE — Telephone Encounter (Signed)
Last lipid and seen 04/21/17  [facility]

## 2017-10-15 ENCOUNTER — Encounter: Payer: Self-pay | Admitting: Family

## 2017-10-15 ENCOUNTER — Ambulatory Visit: Payer: Medicaid Other | Admitting: Family

## 2017-10-15 VITALS — BP 137/88 | HR 89 | Temp 97.6°F | Ht 66.0 in | Wt 125.4 lb

## 2017-10-15 DIAGNOSIS — G8929 Other chronic pain: Secondary | ICD-10-CM

## 2017-10-15 DIAGNOSIS — M545 Low back pain: Secondary | ICD-10-CM

## 2017-10-15 DIAGNOSIS — F17200 Nicotine dependence, unspecified, uncomplicated: Secondary | ICD-10-CM | POA: Diagnosis not present

## 2017-10-15 DIAGNOSIS — K5900 Constipation, unspecified: Secondary | ICD-10-CM | POA: Diagnosis not present

## 2017-10-15 DIAGNOSIS — D509 Iron deficiency anemia, unspecified: Secondary | ICD-10-CM | POA: Diagnosis not present

## 2017-10-15 DIAGNOSIS — I10 Essential (primary) hypertension: Secondary | ICD-10-CM

## 2017-10-15 DIAGNOSIS — F411 Generalized anxiety disorder: Secondary | ICD-10-CM

## 2017-10-15 DIAGNOSIS — E782 Mixed hyperlipidemia: Secondary | ICD-10-CM

## 2017-10-15 NOTE — Patient Instructions (Signed)

## 2017-10-15 NOTE — Progress Notes (Signed)
Subjective:    Patient ID: [name], female    DOB: 02-16-1966, 52 y.o.   MRN: [id number]  Pt presents to the office today for chronic follow up. Hyperlipidemia  This is a chronic problem. The current episode started more than 1 year ago. The problem is controlled. Recent lipid tests were reviewed and are normal. Pertinent negatives include no shortness of breath. Current antihyperlipidemic treatment includes statins. The current treatment provides moderate improvement of lipids. Risk factors for coronary artery disease include dyslipidemia and hypertension.  Hypertension  This is a chronic problem. The current episode started more than 1 year ago. The problem has been resolved since onset. The problem is controlled. Associated symptoms include anxiety and malaise/fatigue. Pertinent negatives include no peripheral edema or shortness of breath. The current treatment provides moderate improvement. There is no history of kidney disease, CVA or heart failure.  Anxiety  Presents for follow-up visit. Symptoms include excessive worry, irritability, nervous/anxious behavior and panic. Patient reports no shortness of breath. Symptoms occur most days. The severity of symptoms is moderate.   Her past medical history is significant for anemia.  Back Pain  This is a chronic problem. The current episode started more than 1 year ago. The problem occurs intermittently. The problem has been waxing and waning since onset. The pain is present in the lumbar spine. The pain is at a severity of 3/10. The pain is mild. She has tried analgesics and muscle relaxant for the symptoms. The treatment provided mild relief.  Anemia  Presents for follow-up visit. Symptoms include malaise/fatigue. There has been no bruising/bleeding easily, light-headedness or pallor. There is no history of heart failure.      Review of Systems  Constitutional: Positive for irritability and malaise/fatigue.  Respiratory: Negative for  shortness of breath.   Musculoskeletal: Positive for back pain.  Skin: Negative for pallor.  Neurological: Negative for light-headedness.  Hematological: Does not bruise/bleed easily.  Psychiatric/Behavioral: The patient is nervous/anxious.   All other systems reviewed and are negative.      Objective:   Physical Exam  Constitutional: She is oriented to person, place, and time. She appears well-developed and well-nourished. No distress.  HENT:  Head: Normocephalic and atraumatic.  Right Ear: External ear normal.  Left Ear: External ear normal.  Nose: Nose normal.  Mouth/Throat: Oropharynx is clear and moist.  Eyes: Pupils are equal, round, and reactive to light.  Neck: Normal range of motion. Neck supple. No thyromegaly present.  Cardiovascular: Normal rate, regular rhythm, normal heart sounds and intact distal pulses.  No murmur heard. Pulmonary/Chest: Effort normal and breath sounds normal. No respiratory distress. She has no wheezes.  Abdominal: Soft. Bowel sounds are normal. She exhibits no distension. There is no tenderness.  Musculoskeletal: Normal range of motion. She exhibits no edema or tenderness.  Neurological: She is alert and oriented to person, place, and time.  Skin: Skin is warm and dry.  Psychiatric: She has a normal mood and affect. Her behavior is normal. Judgment and thought content normal.  Vitals reviewed.     BP 137/88   Pulse 89   Temp 97.6 F (36.4 C) (Oral)   Ht '5\' 6"'  (1.676 m)   Wt 125 lb 6.4 oz (56.9 kg)   BMI 20.24 kg/m      Assessment & Plan:  1. Essential hypertension, benign - CMP14+EGFR  2. Mixed hyperlipidemia - CMP14+EGFR  3. GAD (generalized anxiety disorder) - CMP14+EGFR  4. Current smoker - CMP14+EGFR  5. Chronic  bilateral low back pain without sciatica  - CMP14+EGFR  6. Constipation, unspecified constipation type - CMP14+EGFR  7. Iron deficiency anemia, unspecified iron deficiency anemia type - Anemia Profile B -  CMP14+EGFR   Continue all meds Labs pending Health Maintenance reviewed Diet and exercise encouraged RTO 6 months  Evelina Dun, FNP

## 2017-10-16 LAB — ANEMIA PROFILE B
BASOS ABS: 0 10*3/uL (ref 0.0–0.2)
Basos: 0 %
EOS (ABSOLUTE): 0.2 10*3/uL (ref 0.0–0.4)
Eos: 2 %
FOLATE: 8.3 ng/mL (ref >3.0)
Ferritin: 593 ng/mL — ABNORMAL HIGH (ref 15–150)
Hematocrit: 35.3 % (ref 34.0–46.6)
Hemoglobin: 12 g/dL (ref 11.1–15.9)
IMMATURE GRANULOCYTES: 0 %
Immature Grans (Abs): 0 10*3/uL (ref 0.0–0.1)
Iron Saturation: 31 % (ref 15–55)
Iron: 69 ug/dL (ref 27–159)
Lymphocytes Absolute: 2.5 10*3/uL (ref 0.7–3.1)
Lymphs: 36 %
MCH: 32.2 pg (ref 26.6–33.0)
MCHC: 34 g/dL (ref 31.5–35.7)
MCV: 95 fL (ref 79–97)
MONOS ABS: 1 10*3/uL — AB (ref 0.1–0.9)
Monocytes: 15 %
NEUTROS PCT: 47 %
Neutrophils Absolute: 3.2 10*3/uL (ref 1.4–7.0)
PLATELETS: 296 10*3/uL (ref 150–379)
RBC: 3.73 x10E6/uL — AB (ref 3.77–5.28)
RDW: 13.3 % (ref 12.3–15.4)
Retic Ct Pct: 1.2 % (ref 0.6–2.6)
TIBC: 225 ug/dL — AB (ref 250–450)
UIBC: 156 ug/dL (ref 131–425)
VITAMIN B 12: 596 pg/mL (ref 232–1245)
WBC: 6.9 10*3/uL (ref 3.4–10.8)

## 2017-10-16 LAB — CMP14+EGFR
A/G RATIO: 2 (ref 1.2–2.2)
ALK PHOS: 44 IU/L (ref 39–117)
ALT: 17 IU/L (ref 0–32)
AST: 23 IU/L (ref 0–40)
Albumin: 4.3 g/dL (ref 3.5–5.5)
BUN/Creatinine Ratio: 14 (ref 9–23)
BUN: 11 mg/dL (ref 6–24)
CHLORIDE: 100 mmol/L (ref 96–106)
CO2: 26 mmol/L (ref 20–29)
Calcium: 9.3 mg/dL (ref 8.7–10.2)
Creatinine, Ser: 0.76 mg/dL (ref 0.57–1.00)
GFR calc Af Amer: 104 mL/min/{1.73_m2} (ref >59)
GFR calc non Af Amer: 90 mL/min/{1.73_m2} (ref >59)
GLUCOSE: 80 mg/dL (ref 65–99)
Globulin, Total: 2.1 g/dL (ref 1.5–4.5)
POTASSIUM: 4.4 mmol/L (ref 3.5–5.2)
Sodium: 140 mmol/L (ref 134–144)
Total Protein: 6.4 g/dL (ref 6.0–8.5)

## 2017-10-24 ENCOUNTER — Other Ambulatory Visit: Payer: Self-pay | Admitting: Family

## 2017-10-24 DIAGNOSIS — M5442 Lumbago with sciatica, left side: Secondary | ICD-10-CM

## 2017-11-06 ENCOUNTER — Telehealth: Payer: Self-pay | Admitting: Family

## 2017-11-10 NOTE — Telephone Encounter (Signed)
Refill available.

## 2018-01-01 ENCOUNTER — Other Ambulatory Visit: Payer: Self-pay | Admitting: Family

## 2018-01-01 DIAGNOSIS — M5442 Lumbago with sciatica, left side: Secondary | ICD-10-CM

## 2018-01-14 ENCOUNTER — Other Ambulatory Visit: Payer: Self-pay | Admitting: Family

## 2018-01-14 NOTE — Telephone Encounter (Signed)
Last seen 10/15/17  [facility]

## 2018-01-17 ENCOUNTER — Other Ambulatory Visit: Payer: Self-pay | Admitting: Family

## 2018-01-17 DIAGNOSIS — M5441 Lumbago with sciatica, right side: Principal | ICD-10-CM

## 2018-01-17 DIAGNOSIS — G8929 Other chronic pain: Secondary | ICD-10-CM

## 2018-01-25 ENCOUNTER — Other Ambulatory Visit: Payer: Self-pay | Admitting: Family

## 2018-02-07 ENCOUNTER — Other Ambulatory Visit: Payer: Self-pay | Admitting: Family

## 2018-02-07 DIAGNOSIS — M5442 Lumbago with sciatica, left side: Secondary | ICD-10-CM

## 2018-02-18 LAB — HM MAMMOGRAPHY

## 2018-03-18 ENCOUNTER — Other Ambulatory Visit: Payer: Self-pay | Admitting: Family

## 2018-03-24 ENCOUNTER — Other Ambulatory Visit: Payer: Self-pay | Admitting: Pediatrics

## 2018-03-24 DIAGNOSIS — M5442 Lumbago with sciatica, left side: Secondary | ICD-10-CM

## 2018-03-26 ENCOUNTER — Other Ambulatory Visit: Payer: Self-pay | Admitting: Family

## 2018-03-26 NOTE — Telephone Encounter (Signed)
Forward to PCP.

## 2018-04-18 ENCOUNTER — Other Ambulatory Visit: Payer: Self-pay | Admitting: Family

## 2018-04-19 NOTE — Telephone Encounter (Signed)
Last seen 10/15/17

## 2018-04-20 ENCOUNTER — Encounter: Payer: Self-pay | Admitting: Family

## 2018-04-20 ENCOUNTER — Ambulatory Visit: Payer: Medicaid Other | Admitting: Family

## 2018-04-20 VITALS — BP 123/83 | HR 79 | Temp 97.9°F | Ht 66.0 in | Wt 127.4 lb

## 2018-04-20 DIAGNOSIS — F411 Generalized anxiety disorder: Secondary | ICD-10-CM | POA: Diagnosis not present

## 2018-04-20 DIAGNOSIS — M818 Other osteoporosis without current pathological fracture: Secondary | ICD-10-CM

## 2018-04-20 DIAGNOSIS — E782 Mixed hyperlipidemia: Secondary | ICD-10-CM | POA: Diagnosis not present

## 2018-04-20 DIAGNOSIS — M5441 Lumbago with sciatica, right side: Secondary | ICD-10-CM

## 2018-04-20 DIAGNOSIS — G8929 Other chronic pain: Secondary | ICD-10-CM

## 2018-04-20 DIAGNOSIS — M545 Low back pain, unspecified: Secondary | ICD-10-CM

## 2018-04-20 DIAGNOSIS — D509 Iron deficiency anemia, unspecified: Secondary | ICD-10-CM

## 2018-04-20 DIAGNOSIS — Z23 Encounter for immunization: Secondary | ICD-10-CM

## 2018-04-20 DIAGNOSIS — I10 Essential (primary) hypertension: Secondary | ICD-10-CM | POA: Diagnosis not present

## 2018-04-20 DIAGNOSIS — F17200 Nicotine dependence, unspecified, uncomplicated: Secondary | ICD-10-CM

## 2018-04-20 DIAGNOSIS — K5900 Constipation, unspecified: Secondary | ICD-10-CM

## 2018-04-20 MED ORDER — TRAMADOL HCL 50 MG PO TABS: 50.0000 mg | ORAL_TABLET | Freq: Four times a day (QID) | ORAL | 5 refills | Status: DC | PRN

## 2018-04-20 NOTE — Patient Instructions (Signed)

## 2018-04-20 NOTE — Progress Notes (Signed)
Subjective:    Patient ID: [name], female    DOB: [birth year], 52 y.o.   MRN: [id number]  Chief Complaint  Patient presents with  . Medical Management of Chronic Issues    six month recheck   PT presents to the office today for chronic follow up.  Hyperlipidemia  This is a chronic problem. The current episode started more than 1 year ago. The problem is controlled. Recent lipid tests were reviewed and are normal. Pertinent negatives include no shortness of breath. Current antihyperlipidemic treatment includes statins. The current treatment provides moderate improvement of lipids. Risk factors for coronary artery disease include dyslipidemia, a sedentary lifestyle and post-menopausal.  Hypertension  This is a chronic problem. The current episode started more than 1 year ago. The problem has been resolved since onset. The problem is controlled. Associated symptoms include anxiety and malaise/fatigue. Pertinent negatives include no headaches, peripheral edema or shortness of breath. Risk factors for coronary artery disease include dyslipidemia, sedentary lifestyle and smoking/tobacco exposure. The current treatment provides moderate improvement. There is no history of kidney disease, CAD/MI, CVA or heart failure.  Arthritis  Presents for follow-up visit. She complains of pain and stiffness. The symptoms have been stable. Affected locations include the left shoulder and right shoulder. Her pain is at a severity of 5/10.  Anemia  Presents for follow-up visit. Symptoms include malaise/fatigue. There has been no confusion or leg swelling. There is no history of heart failure.  Constipation  This is a chronic problem. The current episode started more than 1 year ago. The problem has been resolved since onset. Her stool frequency is 1 time per day.  Anxiety  Presents for follow-up visit. Symptoms include excessive worry, irritability, nervous/anxious behavior and restlessness. Patient reports no  confusion or shortness of breath. Symptoms occur most days. The severity of symptoms is moderate. The quality of sleep is good.   Her past medical history is significant for anemia.      Review of Systems  Constitutional: Positive for irritability and malaise/fatigue.  Respiratory: Negative for shortness of breath.   Gastrointestinal: Positive for constipation.  Musculoskeletal: Positive for arthritis and stiffness.  Neurological: Negative for headaches.  Psychiatric/Behavioral: Negative for confusion. The patient is nervous/anxious.   All other systems reviewed and are negative.      Objective:   Physical Exam  Constitutional: She is oriented to person, place, and time. She appears well-developed and well-nourished. No distress.  HENT:  Head: Normocephalic and atraumatic.  Right Ear: External ear normal.  Left Ear: External ear normal.  Mouth/Throat: Oropharynx is clear and moist.  Eyes: Pupils are equal, round, and reactive to light.  Neck: Normal range of motion. Neck supple. No thyromegaly present.  Cardiovascular: Normal rate, regular rhythm, normal heart sounds and intact distal pulses.  No murmur heard. Pulmonary/Chest: Effort normal and breath sounds normal. No respiratory distress. She has no wheezes.  Abdominal: Soft. Bowel sounds are normal. She exhibits no distension. There is no tenderness.  Musculoskeletal: She exhibits tenderness. She exhibits no edema.  Pain in left shoulder with abduction   Neurological: She is alert and oriented to person, place, and time. She has normal reflexes. No cranial nerve deficit.  Skin: Skin is warm and dry.  Psychiatric: She has a normal mood and affect. Her behavior is normal. Judgment and thought content normal.  Vitals reviewed.     BP 123/83   Pulse 79   Temp 97.9 F (36.6 C) (Oral)   Ht 5'  6" (1.676 m)   Wt 127 lb 6.4 oz (57.8 kg)   BMI 20.56 kg/m      Assessment & Plan:  [name] comes in today with chief  complaint of Medical Management of Chronic Issues (six month recheck)   Diagnosis and orders addressed:  1. Essential hypertension, benign - CMP14+EGFR - CBC with Differential/Platelet  2. Other osteoporosis, unspecified pathological fracture presence - CMP14+EGFR - CBC with Differential/Platelet  3. GAD (generalized anxiety disorder) - CMP14+EGFR - CBC with Differential/Platelet  4. Mixed hyperlipidemia - CMP14+EGFR - CBC with Differential/Platelet - Lipid panel  5. Current smoker - CMP14+EGFR - CBC with Differential/Platelet  6. Chronic bilateral low back pain without sciatica  - traMADol (ULTRAM) 50 MG tablet; Take 1 tablet (50 mg total) by mouth every 6 (six) hours as needed.  Dispense: 60 tablet; Refill: 5 - CMP14+EGFR - CBC with Differential/Platelet  7. Constipation, unspecified constipation type - CMP14+EGFR - CBC with Differential/Platelet  8. Iron deficiency anemia, unspecified iron deficiency anemia type - CMP14+EGFR - CBC with Differential/Platelet  9. Chronic right-sided low back pain with right-sided sciatica - traMADol (ULTRAM) 50 MG tablet; Take 1 tablet (50 mg total) by mouth every 6 (six) hours as needed.  Dispense: 60 tablet; Refill: 5 - CMP14+EGFR - CBC with Differential/Platelet   Labs pending Health Maintenance reviewed Diet and exercise encouraged  Follow up plan: 6 months as CPE with pap   Evelina Dun, FNP

## 2018-04-21 LAB — CBC WITH DIFFERENTIAL/PLATELET
BASOS ABS: 0 10*3/uL (ref 0.0–0.2)
BASOS: 1 %
EOS (ABSOLUTE): 0.3 10*3/uL (ref 0.0–0.4)
Eos: 4 %
Hematocrit: 35.1 % (ref 34.0–46.6)
Hemoglobin: 12 g/dL (ref 11.1–15.9)
IMMATURE GRANS (ABS): 0 10*3/uL (ref 0.0–0.1)
IMMATURE GRANULOCYTES: 0 %
LYMPHS: 33 %
Lymphocytes Absolute: 2.5 10*3/uL (ref 0.7–3.1)
MCH: 31.3 pg (ref 26.6–33.0)
MCHC: 34.2 g/dL (ref 31.5–35.7)
MCV: 91 fL (ref 79–97)
MONOS ABS: 0.9 10*3/uL (ref 0.1–0.9)
Monocytes: 12 %
NEUTROS PCT: 50 %
Neutrophils Absolute: 3.8 10*3/uL (ref 1.4–7.0)
PLATELETS: 334 10*3/uL (ref 150–450)
RBC: 3.84 x10E6/uL (ref 3.77–5.28)
RDW: 12 % — AB (ref 12.3–15.4)
WBC: 7.5 10*3/uL (ref 3.4–10.8)

## 2018-04-21 LAB — CMP14+EGFR
A/G RATIO: 1.9 (ref 1.2–2.2)
ALK PHOS: 45 IU/L (ref 39–117)
ALT: 16 IU/L (ref 0–32)
AST: 21 IU/L (ref 0–40)
Albumin: 4.2 g/dL (ref 3.5–5.5)
BUN/Creatinine Ratio: 12 (ref 9–23)
BUN: 10 mg/dL (ref 6–24)
Bilirubin Total: <0.2 mg/dL (ref 0.0–1.2)
CALCIUM: 9.3 mg/dL (ref 8.7–10.2)
CO2: 25 mmol/L (ref 20–29)
Chloride: 101 mmol/L (ref 96–106)
Creatinine, Ser: 0.83 mg/dL (ref 0.57–1.00)
GFR calc Af Amer: 94 mL/min/{1.73_m2} (ref >59)
GFR, EST NON AFRICAN AMERICAN: 81 mL/min/{1.73_m2} (ref >59)
GLOBULIN, TOTAL: 2.2 g/dL (ref 1.5–4.5)
Glucose: 106 mg/dL — ABNORMAL HIGH (ref 65–99)
POTASSIUM: 4 mmol/L (ref 3.5–5.2)
SODIUM: 139 mmol/L (ref 134–144)
Total Protein: 6.4 g/dL (ref 6.0–8.5)

## 2018-04-21 LAB — LIPID PANEL
CHOLESTEROL TOTAL: 150 mg/dL (ref 100–199)
Chol/HDL Ratio: 2.8 ratio (ref 0.0–4.4)
HDL: 53 mg/dL (ref >39)
LDL Calculated: 68 mg/dL (ref 0–99)
TRIGLYCERIDES: 143 mg/dL (ref 0–149)
VLDL CHOLESTEROL CAL: 29 mg/dL (ref 5–40)

## 2018-05-08 ENCOUNTER — Other Ambulatory Visit: Payer: Self-pay | Admitting: Family

## 2018-05-08 DIAGNOSIS — M5442 Lumbago with sciatica, left side: Secondary | ICD-10-CM

## 2018-05-18 ENCOUNTER — Telehealth: Payer: Self-pay | Admitting: Family

## 2018-05-18 NOTE — Telephone Encounter (Signed)
Aware.She has refills.

## 2018-05-19 ENCOUNTER — Other Ambulatory Visit: Payer: Self-pay | Admitting: Family

## 2018-05-28 ENCOUNTER — Encounter: Payer: Self-pay | Admitting: Family Medicine

## 2018-05-28 ENCOUNTER — Ambulatory Visit: Payer: Medicaid Other | Admitting: Family Medicine

## 2018-05-28 VITALS — BP 120/77 | HR 81 | Temp 97.8°F | Ht 66.0 in | Wt 128.0 lb

## 2018-05-28 DIAGNOSIS — J329 Chronic sinusitis, unspecified: Secondary | ICD-10-CM

## 2018-05-28 DIAGNOSIS — J40 Bronchitis, not specified as acute or chronic: Secondary | ICD-10-CM

## 2018-05-28 MED ORDER — AMOXICILLIN-POT CLAVULANATE 875-125 MG PO TABS: 1.0000 | ORAL_TABLET | Freq: Two times a day (BID) | ORAL | 0 refills | Status: DC

## 2018-05-28 NOTE — Progress Notes (Signed)
Chief Complaint  Patient presents with  . pain in temple    yellow, blood tinged nasal discharge    HPI  Patient presents today for Patient presents with upper respiratory congestion. Rhinorrhea that is frequently purulent. There is moderate sore throat. Patient reports coughing frequently as well.  Yellow sputum noted. There is no fever, chills or sweats. The patient denies being short of breath. Onset was 3-5 days ago. Gradually worsening. Tried OTCs without improvement.  PMH: Smoking status noted ROS: Per HPI  Objective: BP 120/77   Pulse 81   Temp 97.8 F (36.6 C) (Oral)   Ht 5\' 6"  (1.676 m)   Wt 128 lb (58.1 kg)   BMI 20.66 kg/m  Gen: NAD, alert, cooperative with exam HEENT: NCAT, Nasal passages swollen, red TMS clear. Max sinus tender bilat. CV: RRR, good S1/S2, no murmur Resp: Bronchitis changes with scattered wheezes, non-labored Ext: No edema, warm Neuro: Alert and oriented, No gross deficits  Assessment and plan:  1. Sinobronchitis     Meds ordered this encounter  Medications  . amoxicillin-clavulanate (AUGMENTIN) 875-125 MG tablet    Sig: Take 1 tablet by mouth 2 (two) times daily. Take all of this medication    Dispense:  20 tablet    Refill:  0    No orders of the defined types were placed in this encounter.   Follow up as needed.  Claretta Fraise, MD

## 2018-07-03 ENCOUNTER — Other Ambulatory Visit: Payer: Self-pay | Admitting: Family

## 2018-07-03 DIAGNOSIS — M5441 Lumbago with sciatica, right side: Principal | ICD-10-CM

## 2018-07-03 DIAGNOSIS — G8929 Other chronic pain: Secondary | ICD-10-CM

## 2018-07-29 ENCOUNTER — Other Ambulatory Visit: Payer: Self-pay | Admitting: Family

## 2018-07-29 NOTE — Telephone Encounter (Signed)
04/19/18 OV rtc 6 mos

## 2018-08-22 ENCOUNTER — Other Ambulatory Visit: Payer: Self-pay | Admitting: Family

## 2018-08-22 DIAGNOSIS — M5442 Lumbago with sciatica, left side: Secondary | ICD-10-CM

## 2018-09-21 ENCOUNTER — Other Ambulatory Visit: Payer: Self-pay | Admitting: Family

## 2018-10-16 ENCOUNTER — Other Ambulatory Visit: Payer: Self-pay | Admitting: Family

## 2018-10-16 DIAGNOSIS — M5441 Lumbago with sciatica, right side: Secondary | ICD-10-CM

## 2018-10-16 DIAGNOSIS — G8929 Other chronic pain: Secondary | ICD-10-CM

## 2018-10-16 DIAGNOSIS — M5442 Lumbago with sciatica, left side: Secondary | ICD-10-CM

## 2018-10-22 ENCOUNTER — Ambulatory Visit: Payer: Medicaid Other | Admitting: Family

## 2018-10-29 ENCOUNTER — Other Ambulatory Visit: Payer: Self-pay | Admitting: Family

## 2018-10-29 DIAGNOSIS — M545 Low back pain, unspecified: Secondary | ICD-10-CM

## 2018-10-29 DIAGNOSIS — G8929 Other chronic pain: Secondary | ICD-10-CM

## 2018-11-19 ENCOUNTER — Other Ambulatory Visit: Payer: Self-pay

## 2018-11-19 ENCOUNTER — Encounter (INDEPENDENT_AMBULATORY_CARE_PROVIDER_SITE_OTHER): Payer: Self-pay

## 2018-11-22 ENCOUNTER — Encounter: Payer: Self-pay | Admitting: Family

## 2018-11-22 ENCOUNTER — Ambulatory Visit (INDEPENDENT_AMBULATORY_CARE_PROVIDER_SITE_OTHER): Payer: Medicaid Other | Admitting: Family

## 2018-11-22 ENCOUNTER — Other Ambulatory Visit: Payer: Self-pay

## 2018-11-22 VITALS — BP 126/84 | HR 86 | Temp 97.3°F | Ht 66.0 in | Wt 117.6 lb

## 2018-11-22 DIAGNOSIS — Z01419 Encounter for gynecological examination (general) (routine) without abnormal findings: Secondary | ICD-10-CM

## 2018-11-22 DIAGNOSIS — K5900 Constipation, unspecified: Secondary | ICD-10-CM

## 2018-11-22 DIAGNOSIS — E782 Mixed hyperlipidemia: Secondary | ICD-10-CM

## 2018-11-22 DIAGNOSIS — F411 Generalized anxiety disorder: Secondary | ICD-10-CM

## 2018-11-22 DIAGNOSIS — M818 Other osteoporosis without current pathological fracture: Secondary | ICD-10-CM

## 2018-11-22 DIAGNOSIS — M545 Low back pain: Secondary | ICD-10-CM

## 2018-11-22 DIAGNOSIS — F17200 Nicotine dependence, unspecified, uncomplicated: Secondary | ICD-10-CM

## 2018-11-22 DIAGNOSIS — I10 Essential (primary) hypertension: Secondary | ICD-10-CM

## 2018-11-22 DIAGNOSIS — M5441 Lumbago with sciatica, right side: Secondary | ICD-10-CM

## 2018-11-22 DIAGNOSIS — G8929 Other chronic pain: Secondary | ICD-10-CM

## 2018-11-22 DIAGNOSIS — Z0001 Encounter for general adult medical examination with abnormal findings: Secondary | ICD-10-CM | POA: Diagnosis not present

## 2018-11-22 DIAGNOSIS — D509 Iron deficiency anemia, unspecified: Secondary | ICD-10-CM

## 2018-11-22 DIAGNOSIS — Z0000 Encounter for general adult medical examination without abnormal findings: Secondary | ICD-10-CM

## 2018-11-22 LAB — MICROSCOPIC EXAMINATION
RBC, Urine: NONE SEEN /hpf (ref 0–2)
Renal Epithel, UA: NONE SEEN /hpf

## 2018-11-22 LAB — URINALYSIS, COMPLETE
Bilirubin, UA: NEGATIVE
Glucose, UA: NEGATIVE
Ketones, UA: NEGATIVE
Leukocytes,UA: NEGATIVE
Nitrite, UA: NEGATIVE
Protein,UA: NEGATIVE
RBC, UA: NEGATIVE
Specific Gravity, UA: 1.02 (ref 1.005–1.030)
Urobilinogen, Ur: 0.2 mg/dL (ref 0.2–1.0)
pH, UA: 7 (ref 5.0–7.5)

## 2018-11-22 MED ORDER — ESCITALOPRAM OXALATE 20 MG PO TABS: 20.0000 mg | ORAL_TABLET | Freq: Every day | ORAL | 1 refills | Status: DC

## 2018-11-22 MED ORDER — SIMVASTATIN 20 MG PO TABS: ORAL_TABLET | ORAL | 1 refills | Status: DC

## 2018-11-22 MED ORDER — ALENDRONATE SODIUM 70 MG PO TABS: ORAL_TABLET | ORAL | 0 refills | Status: DC

## 2018-11-22 MED ORDER — TRAMADOL HCL 50 MG PO TABS: 50.0000 mg | ORAL_TABLET | Freq: Four times a day (QID) | ORAL | 2 refills | Status: DC | PRN

## 2018-11-22 MED ORDER — BUSPIRONE HCL 10 MG PO TABS: 10.0000 mg | ORAL_TABLET | Freq: Three times a day (TID) | ORAL | 2 refills | Status: DC

## 2018-11-22 NOTE — Patient Instructions (Signed)

## 2018-11-22 NOTE — Progress Notes (Signed)
Subjective:    Patient ID: [name], female    DOB: 03-10-1966, 53 y.o.   MRN: [id number]   Chief Complaint  Patient presents with  . Gynecologic Exam   PT presents to the office today for CPE and pap. Pt states her mother and father passed away 10-04-18. She states she feels overwhelmed.  Gynecologic Exam  The patient's pertinent negatives include no genital lesions, genital odor, vaginal bleeding or vaginal discharge. She is not pregnant. Associated symptoms include constipation.  Hypertension  This is a chronic problem. The current episode started more than 1 year ago. The problem has been resolved since onset. The problem is controlled. Associated symptoms include anxiety. Pertinent negatives include no malaise/fatigue, peripheral edema or shortness of breath. Risk factors for coronary artery disease include sedentary lifestyle. The current treatment provides moderate improvement. There is no history of CAD/MI or heart failure.  Hyperlipidemia  This is a chronic problem. The current episode started more than 1 year ago. The problem is controlled. Recent lipid tests were reviewed and are normal. Pertinent negatives include no shortness of breath. Current antihyperlipidemic treatment includes statins. The current treatment provides moderate improvement of lipids.  Anxiety  Presents for follow-up visit. Symptoms include decreased concentration, depressed mood, excessive worry, insomnia, irritability, nervous/anxious behavior, panic and restlessness. Patient reports no shortness of breath. Symptoms occur most days. The severity of symptoms is moderate.    Constipation  This is a chronic problem. The current episode started more than 1 year ago. The problem has been waxing and waning since onset. Her stool frequency is 1 time per day. She has tried laxatives for the symptoms. The treatment provided moderate relief.      Review of Systems  Constitutional: Positive for irritability.  Negative for malaise/fatigue.  Respiratory: Negative for shortness of breath.   Gastrointestinal: Positive for constipation.  Genitourinary: Negative for vaginal discharge.  Psychiatric/Behavioral: Positive for decreased concentration. The patient is nervous/anxious and has insomnia.   All other systems reviewed and are negative.      Objective:   Physical Exam Vitals signs reviewed.  Constitutional:      General: She is not in acute distress.    Appearance: She is well-developed.  HENT:     Head: Normocephalic and atraumatic.     Right Ear: Tympanic membrane normal.     Left Ear: Tympanic membrane normal.  Eyes:     Pupils: Pupils are equal, round, and reactive to light.  Neck:     Musculoskeletal: Normal range of motion and neck supple.     Thyroid: No thyromegaly.  Cardiovascular:     Rate and Rhythm: Normal rate and regular rhythm.     Heart sounds: Normal heart sounds. No murmur.  Pulmonary:     Effort: Pulmonary effort is normal. No respiratory distress.     Breath sounds: Normal breath sounds. No wheezing.  Chest:     Breasts:        Right: No swelling, bleeding, inverted nipple, mass, nipple discharge, skin change or tenderness.        Left: No swelling, bleeding, inverted nipple, mass, nipple discharge, skin change or tenderness.  Abdominal:     General: Bowel sounds are normal. There is no distension.     Palpations: Abdomen is soft.     Tenderness: There is no abdominal tenderness.  Genitourinary:    Pubic Area: No rash.      Labia:        Right: No rash,  tenderness, lesion or injury.        Left: No rash, tenderness, lesion or injury.      Comments: Bimanual exam- no adnexal masses or tenderness, ovaries nonpalpable   Cervix not present,  No discharge  Musculoskeletal: Normal range of motion.        General: No tenderness.  Skin:    General: Skin is warm and dry.  Neurological:     Mental Status: She is alert and oriented to person, place, and time.      Cranial Nerves: No cranial nerve deficit.     Deep Tendon Reflexes: Reflexes are normal and symmetric.  Psychiatric:        Behavior: Behavior normal.        Thought Content: Thought content normal.        Judgment: Judgment normal.       BP 126/84   Pulse 86   Temp (!) 97.3 F (36.3 C) (Oral)   Ht '5\' 6"'  (1.676 m)   Wt 117 lb 9.6 oz (53.3 kg)   BMI 18.98 kg/m      Assessment & Plan:  [name] comes in today with chief complaint of Gynecologic Exam   Diagnosis and orders addressed:  1. Encounter for gynecological examination without abnormal finding  - Urinalysis, Complete - Anemia Profile B - CMP14+EGFR - IGP, Aptima HPV, rfx 16/18,45  2. Annual physical exam - Anemia Profile B - CMP14+EGFR - Lipid panel - TSH - IGP, Aptima HPV, rfx 16/18,45  3. Chronic right-sided low back pain with right-sided sciatica - traMADol (ULTRAM) 50 MG tablet; Take 1 tablet (50 mg total) by mouth every 6 (six) hours as needed.  Dispense: 60 tablet; Refill: 2  4. Chronic bilateral low back pain without sciatica - traMADol (ULTRAM) 50 MG tablet; Take 1 tablet (50 mg total) by mouth every 6 (six) hours as needed.  Dispense: 60 tablet; Refill: 2  5. Other osteoporosis, unspecified pathological fracture presence  6. Mixed hyperlipidemia - simvastatin (ZOCOR) 20 MG tablet; TAKE 1 TABLET BY MOUTH EVERYDAY AT BEDTIME  Dispense: 90 tablet; Refill: 1 - Lipid panel  7. GAD (generalized anxiety disorder) - - escitalopram (LEXAPRO) 20 MG tablet; Take 1 tablet (20 mg total) by mouth daily.  Dispense: 90 tablet; Refill: 1 - busPIRone (BUSPAR) 10 MG tablet; Take 1 tablet (10 mg total) by mouth 3 (three) times daily.  Dispense: 90 tablet; Refill: 2  8. Essential hypertension, benign  9. Current smoker  10. Constipation, unspecified constipation type  11. Iron deficiency anemia, unspecified iron deficiency anemia type - Anemia Profile B   Labs pending Health Maintenance reviewed  Diet and exercise encouraged  Follow up plan: 3 months   Evelina Dun, FNP

## 2018-11-23 LAB — ANEMIA PROFILE B
Basophils Absolute: 0 10*3/uL (ref 0.0–0.2)
Basos: 1 %
EOS (ABSOLUTE): 0.1 10*3/uL (ref 0.0–0.4)
Eos: 2 %
Ferritin: 587 ng/mL — ABNORMAL HIGH (ref 15–150)
Folate: 6.1 ng/mL (ref >3.0)
Hematocrit: 36.5 % (ref 34.0–46.6)
Hemoglobin: 12.1 g/dL (ref 11.1–15.9)
Immature Grans (Abs): 0 10*3/uL (ref 0.0–0.1)
Immature Granulocytes: 0 %
Iron Saturation: 26 % (ref 15–55)
Iron: 60 ug/dL (ref 27–159)
Lymphocytes Absolute: 2.3 10*3/uL (ref 0.7–3.1)
Lymphs: 35 %
MCH: 31.3 pg (ref 26.6–33.0)
MCHC: 33.2 g/dL (ref 31.5–35.7)
MCV: 94 fL (ref 79–97)
Monocytes Absolute: 0.8 10*3/uL (ref 0.1–0.9)
Monocytes: 11 %
Neutrophils Absolute: 3.4 10*3/uL (ref 1.4–7.0)
Neutrophils: 51 %
Platelets: 295 10*3/uL (ref 150–450)
RBC: 3.87 x10E6/uL (ref 3.77–5.28)
RDW: 12.6 % (ref 11.7–15.4)
Retic Ct Pct: 1.1 % (ref 0.6–2.6)
Total Iron Binding Capacity: 235 ug/dL — ABNORMAL LOW (ref 250–450)
UIBC: 175 ug/dL (ref 131–425)
Vitamin B-12: 463 pg/mL (ref 232–1245)
WBC: 6.7 10*3/uL (ref 3.4–10.8)

## 2018-11-23 LAB — CMP14+EGFR
ALT: 15 IU/L (ref 0–32)
AST: 22 IU/L (ref 0–40)
Albumin/Globulin Ratio: 2.3 — ABNORMAL HIGH (ref 1.2–2.2)
Albumin: 4.4 g/dL (ref 3.8–4.9)
Alkaline Phosphatase: 44 IU/L (ref 39–117)
BUN/Creatinine Ratio: 11 (ref 9–23)
BUN: 9 mg/dL (ref 6–24)
Bilirubin Total: <0.2 mg/dL (ref 0.0–1.2)
CO2: 25 mmol/L (ref 20–29)
Calcium: 9.3 mg/dL (ref 8.7–10.2)
Chloride: 101 mmol/L (ref 96–106)
Creatinine, Ser: 0.81 mg/dL (ref 0.57–1.00)
GFR calc Af Amer: 96 mL/min/{1.73_m2} (ref >59)
GFR calc non Af Amer: 83 mL/min/{1.73_m2} (ref >59)
Globulin, Total: 1.9 g/dL (ref 1.5–4.5)
Glucose: 76 mg/dL (ref 65–99)
Potassium: 3.8 mmol/L (ref 3.5–5.2)
Sodium: 141 mmol/L (ref 134–144)
Total Protein: 6.3 g/dL (ref 6.0–8.5)

## 2018-11-23 LAB — LIPID PANEL
Chol/HDL Ratio: 2.3 ratio (ref 0.0–4.4)
Cholesterol, Total: 148 mg/dL (ref 100–199)
HDL: 65 mg/dL (ref >39)
LDL Calculated: 64 mg/dL (ref 0–99)
Triglycerides: 94 mg/dL (ref 0–149)
VLDL Cholesterol Cal: 19 mg/dL (ref 5–40)

## 2018-11-23 LAB — TSH: TSH: 1.28 u[IU]/mL (ref 0.450–4.500)

## 2018-11-26 LAB — TOXASSURE SELECT 13 (MW), URINE

## 2018-11-26 LAB — IGP, APTIMA HPV, RFX 16/18,45: HPV Aptima: NEGATIVE

## 2018-12-29 ENCOUNTER — Other Ambulatory Visit: Payer: Self-pay | Admitting: Family

## 2018-12-29 DIAGNOSIS — M5442 Lumbago with sciatica, left side: Secondary | ICD-10-CM

## 2019-02-03 ENCOUNTER — Other Ambulatory Visit: Payer: Self-pay | Admitting: Family

## 2019-02-03 DIAGNOSIS — G8929 Other chronic pain: Secondary | ICD-10-CM

## 2019-02-21 ENCOUNTER — Other Ambulatory Visit: Payer: Self-pay

## 2019-02-22 ENCOUNTER — Encounter: Payer: Self-pay | Admitting: Family

## 2019-02-22 ENCOUNTER — Ambulatory Visit (INDEPENDENT_AMBULATORY_CARE_PROVIDER_SITE_OTHER): Payer: Medicaid Other | Admitting: Family

## 2019-02-22 VITALS — BP 134/87 | HR 95 | Temp 98.1°F | Ht 66.0 in | Wt 118.5 lb

## 2019-02-22 DIAGNOSIS — R319 Hematuria, unspecified: Secondary | ICD-10-CM

## 2019-02-22 DIAGNOSIS — E782 Mixed hyperlipidemia: Secondary | ICD-10-CM

## 2019-02-22 DIAGNOSIS — F411 Generalized anxiety disorder: Secondary | ICD-10-CM | POA: Diagnosis not present

## 2019-02-22 DIAGNOSIS — F320 Major depressive disorder, single episode, mild: Secondary | ICD-10-CM

## 2019-02-22 DIAGNOSIS — G8929 Other chronic pain: Secondary | ICD-10-CM

## 2019-02-22 DIAGNOSIS — F17200 Nicotine dependence, unspecified, uncomplicated: Secondary | ICD-10-CM

## 2019-02-22 DIAGNOSIS — N390 Urinary tract infection, site not specified: Secondary | ICD-10-CM

## 2019-02-22 DIAGNOSIS — I10 Essential (primary) hypertension: Secondary | ICD-10-CM | POA: Diagnosis not present

## 2019-02-22 DIAGNOSIS — M818 Other osteoporosis without current pathological fracture: Secondary | ICD-10-CM

## 2019-02-22 DIAGNOSIS — D509 Iron deficiency anemia, unspecified: Secondary | ICD-10-CM

## 2019-02-22 DIAGNOSIS — R8290 Unspecified abnormal findings in urine: Secondary | ICD-10-CM | POA: Diagnosis not present

## 2019-02-22 DIAGNOSIS — M545 Low back pain, unspecified: Secondary | ICD-10-CM

## 2019-02-22 DIAGNOSIS — K5900 Constipation, unspecified: Secondary | ICD-10-CM

## 2019-02-22 DIAGNOSIS — B370 Candidal stomatitis: Secondary | ICD-10-CM

## 2019-02-22 DIAGNOSIS — M5441 Lumbago with sciatica, right side: Secondary | ICD-10-CM

## 2019-02-22 LAB — URINALYSIS, COMPLETE
Bilirubin, UA: NEGATIVE
Glucose, UA: NEGATIVE
Ketones, UA: NEGATIVE
Nitrite, UA: POSITIVE — AB
Specific Gravity, UA: 1.025 (ref 1.005–1.030)
Urobilinogen, Ur: 1 mg/dL (ref 0.2–1.0)
pH, UA: 5.5 (ref 5.0–7.5)

## 2019-02-22 LAB — MICROSCOPIC EXAMINATION: WBC, UA: >30 /hpf — AB (ref 0–5)

## 2019-02-22 MED ORDER — TRAMADOL HCL 50 MG PO TABS: 50.0000 mg | ORAL_TABLET | Freq: Two times a day (BID) | ORAL | 2 refills | Status: DC | PRN

## 2019-02-22 MED ORDER — ESCITALOPRAM OXALATE 20 MG PO TABS: 20.0000 mg | ORAL_TABLET | Freq: Every day | ORAL | 1 refills | Status: DC

## 2019-02-22 MED ORDER — BUSPIRONE HCL 15 MG PO TABS: 15.0000 mg | ORAL_TABLET | Freq: Two times a day (BID) | ORAL | 3 refills | Status: DC

## 2019-02-22 MED ORDER — SIMVASTATIN 20 MG PO TABS: ORAL_TABLET | ORAL | 1 refills | Status: DC

## 2019-02-22 MED ORDER — CEPHALEXIN 500 MG PO CAPS: 500.0000 mg | ORAL_CAPSULE | Freq: Two times a day (BID) | ORAL | 0 refills | Status: DC

## 2019-02-22 MED ORDER — NYSTATIN 100000 UNIT/ML MT SUSP: 5.0000 mL | Freq: Four times a day (QID) | OROMUCOSAL | 0 refills | Status: DC

## 2019-02-22 MED ORDER — FLUTICASONE PROPIONATE 50 MCG/ACT NA SUSP: NASAL | 5 refills | Status: DC

## 2019-02-22 MED ORDER — ALENDRONATE SODIUM 70 MG PO TABS: ORAL_TABLET | ORAL | 0 refills | Status: DC

## 2019-02-22 NOTE — Patient Instructions (Signed)
Urinary Tract Infection, Adult A urinary tract infection (UTI) is an infection of any part of the urinary tract. The urinary tract includes:  The kidneys.  The ureters.  The bladder.  The urethra. These organs make, store, and get rid of pee (urine) in the body. What are the causes? This is caused by germs (bacteria) in your genital area. These germs grow and cause swelling (inflammation) of your urinary tract. What increases the risk? You are more likely to develop this condition if:  You have a small, thin tube (catheter) to drain pee.  You cannot control when you pee or poop (incontinence).  You are female, and: ? You use these methods to prevent pregnancy: ? A medicine that kills sperm (spermicide). ? A device that blocks sperm (diaphragm). ? You have low levels of a female hormone (estrogen). ? You are pregnant.  You have genes that add to your risk.  You are sexually active.  You take antibiotic medicines.  You have trouble peeing because of: ? A prostate that is bigger than normal, if you are female. ? A blockage in the part of your body that drains pee from the bladder (urethra). ? A kidney stone. ? A nerve condition that affects your bladder (neurogenic bladder). ? Not getting enough to drink. ? Not peeing often enough.  You have other conditions, such as: ? Diabetes. ? A weak disease-fighting system (immune system). ? Sickle cell disease. ? Gout. ? Injury of the spine. What are the signs or symptoms? Symptoms of this condition include:  Needing to pee right away (urgently).  Peeing often.  Peeing small amounts often.  Pain or burning when peeing.  Blood in the pee.  Pee that smells bad or not like normal.  Trouble peeing.  Pee that is cloudy.  Fluid coming from the vagina, if you are female.  Pain in the belly or lower back. Other symptoms include:  Throwing up (vomiting).  No urge to eat.  Feeling mixed up (confused).  Being tired  and grouchy (irritable).  A fever.  Watery poop (diarrhea). How is this treated? This condition may be treated with:  Antibiotic medicine.  Other medicines.  Drinking enough water. Follow these instructions at home:  Medicines  Take over-the-counter and prescription medicines only as told by your doctor.  If you were prescribed an antibiotic medicine, take it as told by your doctor. Do not stop taking it even if you start to feel better. General instructions  Make sure you: ? Pee until your bladder is empty. ? Do not hold pee for a long time. ? Empty your bladder after sex. ? Wipe from front to back after pooping if you are a female. Use each tissue one time when you wipe.  Drink enough fluid to keep your pee pale yellow.  Keep all follow-up visits as told by your doctor. This is important. Contact a doctor if:  You do not get better after 1-2 days.  Your symptoms go away and then come back. Get help right away if:  You have very bad back pain.  You have very bad pain in your lower belly.  You have a fever.  You are sick to your stomach (nauseous).  You are throwing up. Summary  A urinary tract infection (UTI) is an infection of any part of the urinary tract.  This condition is caused by germs in your genital area.  There are many risk factors for a UTI. These include having a small, thin   tube to drain pee and not being able to control when you pee or poop.  Treatment includes antibiotic medicines for germs.  Drink enough fluid to keep your pee pale yellow. This information is not intended to replace advice given to you by your health care provider. Make sure you discuss any questions you have with your health care provider. Document Released: 11/26/2007 Document Revised: 05/27/2018 Document Reviewed: 12/17/2017 [employer] Patient Education  2020 [employer]  

## 2019-02-22 NOTE — Progress Notes (Signed)
Subjective:    Patient ID: [name], female    DOB: 04-04-1966, 53 y.o.   MRN: [id number]  Chief Complaint  Patient presents with  . Hypertension  . Fatigue, headache, abnormal urine odor    Hypertension This is a chronic problem. The current episode started more than 1 year ago. The problem has been waxing and waning since onset. The problem is controlled. Associated symptoms include anxiety and malaise/fatigue. Pertinent negatives include no peripheral edema or shortness of breath. Risk factors for coronary artery disease include dyslipidemia, obesity, sedentary lifestyle and smoking/tobacco exposure. The current treatment provides moderate improvement.  Anemia Presents for follow-up visit. Symptoms include malaise/fatigue. There has been no bruising/bleeding easily, confusion or paresthesias.  Anxiety Presents for follow-up visit. Symptoms include depressed mood, excessive worry, insomnia, irritability, nausea, nervous/anxious behavior and restlessness. Patient reports no confusion or shortness of breath. Symptoms occur occasionally. The severity of symptoms is moderate.   Her past medical history is significant for anemia.  Depression        This is a chronic problem.  The current episode started more than 1 year ago.   The onset quality is gradual.   The problem occurs intermittently.  Associated symptoms include insomnia, irritable, restlessness, decreased interest and sad.  Associated symptoms include no helplessness and no hopelessness.  Past treatments include SSRIs - Selective serotonin reuptake inhibitors.  Past medical history includes anxiety.   Hyperlipidemia This is a chronic problem. The current episode started more than 1 year ago. The problem is controlled. Recent lipid tests were reviewed and are normal. Pertinent negatives include no shortness of breath. Current antihyperlipidemic treatment includes statins. The current treatment provides moderate improvement of  lipids. Risk factors for coronary artery disease include dyslipidemia, hypertension, a sedentary lifestyle and post-menopausal.  Nicotine Dependence Presents for follow-up visit. Symptoms include insomnia and irritability. Her urge triggers include company of smokers. She smokes 1 pack of cigarettes per day.  Constipation This is a chronic problem. The current episode started more than 1 year ago. The problem has been waxing and waning since onset. Associated symptoms include back pain and nausea. She has tried laxatives for the symptoms. The treatment provided moderate relief.  Back Pain This is a chronic problem. The current episode started more than 1 year ago. The problem occurs intermittently. The problem has been waxing and waning since onset. The pain is present in the lumbar spine. The pain is mild. Pertinent negatives include no paresthesias.  Urinary Frequency  This is a new problem. The current episode started in the past 7 days. The problem occurs intermittently. The problem has been waxing and waning. The pain is at a severity of 2/10. The pain is mild. Associated symptoms include frequency, hesitancy, nausea and urgency. Pertinent negatives include no flank pain or hematuria. She has tried increased fluids for the symptoms. The treatment provided mild relief.      Review of Systems  Constitutional: Positive for irritability and malaise/fatigue.  Respiratory: Negative for shortness of breath.   Gastrointestinal: Positive for constipation and nausea.  Genitourinary: Positive for frequency, hesitancy and urgency. Negative for flank pain and hematuria.  Musculoskeletal: Positive for back pain.  Neurological: Negative for paresthesias.  Hematological: Does not bruise/bleed easily.  Psychiatric/Behavioral: Positive for depression. Negative for confusion. The patient is nervous/anxious and has insomnia.   All other systems reviewed and are negative.      Objective:   Physical Exam  Vitals signs reviewed.  Constitutional:  General: She is irritable. She is not in acute distress.    Appearance: She is well-developed.  HENT:     Head: Normocephalic and atraumatic.     Right Ear: Tympanic membrane normal.     Left Ear: Tympanic membrane normal.     Mouth/Throat:     Pharynx: Posterior oropharyngeal erythema present.     Comments: White coating on cheeks and tongue Eyes:     Pupils: Pupils are equal, round, and reactive to light.  Neck:     Musculoskeletal: Normal range of motion and neck supple.     Thyroid: No thyromegaly.  Cardiovascular:     Rate and Rhythm: Normal rate and regular rhythm.     Heart sounds: Normal heart sounds. No murmur.  Pulmonary:     Effort: Pulmonary effort is normal. No respiratory distress.     Breath sounds: Normal breath sounds. No wheezing.  Abdominal:     General: Bowel sounds are normal. There is no distension.     Palpations: Abdomen is soft.     Tenderness: There is abdominal tenderness (mild lower abdomen).  Musculoskeletal: Normal range of motion.        General: No tenderness.  Skin:    General: Skin is warm and dry.  Neurological:     Mental Status: She is alert and oriented to person, place, and time.     Cranial Nerves: No cranial nerve deficit.     Deep Tendon Reflexes: Reflexes are normal and symmetric.  Psychiatric:        Behavior: Behavior normal.        Thought Content: Thought content normal.        Judgment: Judgment normal.       BP 134/87   Pulse 95   Temp 98.1 F (36.7 C) (Oral)   Ht _0  (1.676 m)   Wt 118 lb 8 oz (53.8 kg)   BMI 19.13 kg/m      Assessment & Plan:  HOLIDAY MCMENAMIN comes in today with chief complaint of Hypertension and Fatigue, headache, abnormal urine odor   Diagnosis and orders addressed:  1. Abnormal urine odor - Urinalysis, Complete - CBC with Differential/Platelet - CMP14+EGFR  2. Mixed hyperlipidemia - simvastatin (ZOCOR) 20 MG tablet; TAKE 1 TABLET BY  MOUTH EVERYDAY AT BEDTIME  Dispense: 90 tablet; Refill: 1 - CBC with Differential/Platelet - CMP14+EGFR  3. GAD (generalized anxiety disorder) - escitalopram (LEXAPRO) 20 MG tablet; Take 1 tablet (20 mg total) by mouth daily.  Dispense: 90 tablet; Refill: 1 - CBC with Differential/Platelet - CMP14+EGFR - busPIRone (BUSPAR) 15 MG tablet; Take 1 tablet (15 mg total) by mouth 2 (two) times daily.  Dispense: 60 tablet; Refill: 3  4. Essential hypertension, benign - CBC with Differential/Platelet - CMP14+EGFR  5. Other osteoporosis, unspecified pathological fracture presence - alendronate (FOSAMAX) 70 MG tablet; TAKE 1 TABLET BY MOUTH EVERY 7 DAYS--TAKE WITH A FULL GLASS OF WATER ON AN EMPTY STOMACH  Dispense: 12 tablet; Refill: 0 - CBC with Differential/Platelet - CMP14+EGFR  6. Iron deficiency anemia, unspecified iron deficiency anemia type - CBC with Differential/Platelet - CMP14+EGFR  7. Chronic bilateral low back pain without sciatica - CBC with Differential/Platelet - CMP14+EGFR - traMADol (ULTRAM) 50 MG tablet; Take 1 tablet (50 mg total) by mouth every 12 (twelve) hours as needed.  Dispense: 45 tablet; Refill: 2  8. Constipation, unspecified constipation type - CBC with Differential/Platelet - CMP14+EGFR  9. Current smoker Smoking cessation discussed - CBC with  Differential/Platelet - CMP14+EGFR  10. Depression, major, single episode, mild (HCC) Will start Buspar 15 mg BID if not helping increase to TID - escitalopram (LEXAPRO) 20 MG tablet; Take 1 tablet (20 mg total) by mouth daily.  Dispense: 90 tablet; Refill: 1 - CBC with Differential/Platelet - CMP14+EGFR - busPIRone (BUSPAR) 15 MG tablet; Take 1 tablet (15 mg total) by mouth 2 (two) times daily.  Dispense: 60 tablet; Refill: 3  11. Urinary tract infection with hematuria, site unspecified Start Keflex today Urine culture pending - Urine Culture  12. Chronic right-sided low back pain with right-sided sciatica  - traMADol (ULTRAM) 50 MG tablet; Take 1 tablet (50 mg total) by mouth every 12 (twelve) hours as needed.  Dispense: 45 tablet; Refill: 2  13. Oral thrush Will start nystatin today - nystatin (MYCOSTATIN) 100000 UNIT/ML suspension; Take 5 mLs (500,000 Units total) by mouth 4 (four) times daily.  Dispense: 473 mL; Refill: 0   Labs pending Pt reviewed in [place] controlled database- No red flags. Ultram decreased to #45 a month from #60 Health Maintenance reviewed Diet and exercise encouraged  Follow up plan: 3 months    Evelina Dun, FNP

## 2019-02-23 LAB — CMP14+EGFR
ALT: 10 IU/L (ref 0–32)
AST: 16 IU/L (ref 0–40)
Albumin/Globulin Ratio: 1.9 (ref 1.2–2.2)
Albumin: 4.1 g/dL (ref 3.8–4.9)
Alkaline Phosphatase: 63 IU/L (ref 39–117)
BUN/Creatinine Ratio: 11 (ref 9–23)
BUN: 8 mg/dL (ref 6–24)
Bilirubin Total: 0.3 mg/dL (ref 0.0–1.2)
CO2: 26 mmol/L (ref 20–29)
Calcium: 9.3 mg/dL (ref 8.7–10.2)
Chloride: 95 mmol/L — ABNORMAL LOW (ref 96–106)
Creatinine, Ser: 0.73 mg/dL (ref 0.57–1.00)
GFR calc Af Amer: 109 mL/min/{1.73_m2} (ref >59)
GFR calc non Af Amer: 94 mL/min/{1.73_m2} (ref >59)
Globulin, Total: 2.2 g/dL (ref 1.5–4.5)
Glucose: 114 mg/dL — ABNORMAL HIGH (ref 65–99)
Potassium: 3.4 mmol/L — ABNORMAL LOW (ref 3.5–5.2)
Sodium: 135 mmol/L (ref 134–144)
Total Protein: 6.3 g/dL (ref 6.0–8.5)

## 2019-02-23 LAB — CBC WITH DIFFERENTIAL/PLATELET
Basophils Absolute: 0 10*3/uL (ref 0.0–0.2)
Basos: 0 %
EOS (ABSOLUTE): 0 10*3/uL (ref 0.0–0.4)
Eos: 0 %
Hematocrit: 35 % (ref 34.0–46.6)
Hemoglobin: 11.6 g/dL (ref 11.1–15.9)
Immature Grans (Abs): 0.1 10*3/uL (ref 0.0–0.1)
Immature Granulocytes: 1 %
Lymphocytes Absolute: 1.3 10*3/uL (ref 0.7–3.1)
Lymphs: 6 %
MCH: 30.8 pg (ref 26.6–33.0)
MCHC: 33.1 g/dL (ref 31.5–35.7)
MCV: 93 fL (ref 79–97)
Monocytes Absolute: 2.4 10*3/uL — ABNORMAL HIGH (ref 0.1–0.9)
Monocytes: 12 %
Neutrophils Absolute: 16.5 10*3/uL — ABNORMAL HIGH (ref 1.4–7.0)
Neutrophils: 81 %
Platelets: 356 10*3/uL (ref 150–450)
RBC: 3.77 x10E6/uL (ref 3.77–5.28)
RDW: 12.3 % (ref 11.7–15.4)
WBC: 20.3 10*3/uL (ref 3.4–10.8)

## 2019-02-24 ENCOUNTER — Other Ambulatory Visit: Payer: Self-pay | Admitting: *Deleted

## 2019-02-24 DIAGNOSIS — D72829 Elevated white blood cell count, unspecified: Secondary | ICD-10-CM

## 2019-02-24 LAB — URINE CULTURE

## 2019-02-25 ENCOUNTER — Other Ambulatory Visit: Payer: Self-pay

## 2019-02-25 ENCOUNTER — Other Ambulatory Visit: Payer: Medicaid Other

## 2019-02-25 DIAGNOSIS — D72829 Elevated white blood cell count, unspecified: Secondary | ICD-10-CM

## 2019-02-25 LAB — MICROSCOPIC EXAMINATION: Renal Epithel, UA: NONE SEEN /hpf

## 2019-02-25 LAB — URINALYSIS, COMPLETE
Bilirubin, UA: NEGATIVE
Glucose, UA: NEGATIVE
Ketones, UA: NEGATIVE
Nitrite, UA: NEGATIVE
Specific Gravity, UA: 1.02 (ref 1.005–1.030)
Urobilinogen, Ur: 4 mg/dL — ABNORMAL HIGH (ref 0.2–1.0)
pH, UA: 6 (ref 5.0–7.5)

## 2019-02-27 LAB — URINE CULTURE: Organism ID, Bacteria: NO GROWTH

## 2019-03-07 ENCOUNTER — Telehealth: Payer: Self-pay | Admitting: Family

## 2019-03-07 ENCOUNTER — Ambulatory Visit (INDEPENDENT_AMBULATORY_CARE_PROVIDER_SITE_OTHER): Payer: Medicaid Other | Admitting: Nurse Practitioner

## 2019-03-07 ENCOUNTER — Other Ambulatory Visit: Payer: Self-pay

## 2019-03-07 ENCOUNTER — Encounter: Payer: Self-pay | Admitting: Nurse Practitioner

## 2019-03-07 DIAGNOSIS — N390 Urinary tract infection, site not specified: Secondary | ICD-10-CM

## 2019-03-07 MED ORDER — CIPROFLOXACIN HCL 500 MG PO TABS: 500.0000 mg | ORAL_TABLET | Freq: Two times a day (BID) | ORAL | 0 refills | Status: DC

## 2019-03-07 NOTE — Progress Notes (Signed)
   Virtual Visit via telephone Note Due to COVID-19 pandemic this visit was conducted virtually. This visit type was conducted due to national recommendations for restrictions regarding the COVID-19 Pandemic (e.g. social distancing, sheltering in place) in an effort to limit this patient's exposure and mitigate transmission in our community. All issues noted in this document were discussed and addressed.  A physical exam was not performed with this format.  I connected with [name] on 03/07/19 at 8:40 by telephone and verified that I am speaking with the correct person using two identifiers. [name] is currently located at home and no one  is currently with her during visit. The provider, Mary-Margaret Hassell Done, FNP is located in their office at time of visit.  I discussed the limitations, risks, security and privacy concerns of performing an evaluation and management service by telephone and the availability of in person appointments. I also discussed with the patient that there may be a patient responsible charge related to this service. The patient expressed understanding and agreed to proceed.   History and Present Illness:   Chief Complaint: Urinary Tract Infection   HPI Patient c/o dysuria and foul smelling urine that started yesterday. She had to get up several times to go to the restroom. Just finished taking keflex and symptoms back back as soon as she took last pill.    Review of Systems  Constitutional: Negative for diaphoresis and weight loss.  Eyes: Negative for blurred vision, double vision and pain.  Respiratory: Negative for shortness of breath.   Cardiovascular: Negative for chest pain, palpitations, orthopnea and leg swelling.  Gastrointestinal: Positive for abdominal pain (supra pubic pain).  Genitourinary: Positive for dysuria, frequency and urgency.  Skin: Negative for rash.  Neurological: Negative for dizziness, sensory change, loss of consciousness,  weakness and headaches.  Endo/Heme/Allergies: Negative for polydipsia. Does not bruise/bleed easily.  Psychiatric/Behavioral: Negative for memory loss. The patient does not have insomnia.   All other systems reviewed and are negative.    Observations/Objective: Alert and oriented- answers all questions appropriately mild distress    Assessment and Plan: [name] in today with chief complaint of Urinary Tract Infection   1. Recurrent UTI Take medication as prescribe Cotton underwear Take shower not bath Cranberry juice, yogurt Force fluids AZO over the counter X2 days RTO prn    Follow Up Instructions: prn    I discussed the assessment and treatment plan with the patient. The patient was provided an opportunity to ask questions and all were answered. The patient agreed with the plan and demonstrated an understanding of the instructions.   The patient was advised to call back or seek an in-person evaluation if the symptoms worsen or if the condition fails to improve as anticipated.  The above assessment and management plan was discussed with the patient. The patient verbalized understanding of and has agreed to the management plan. Patient is aware to call the clinic if symptoms persist or worsen. Patient is aware when to return to the clinic for a follow-up visit. Patient educated on when it is appropriate to go to the emergency department.   Time call ended:  8:51  I provided 11 minutes of non-face-to-face time during this encounter.    Mary-Margaret Hassell Done, FNP

## 2019-03-07 NOTE — Telephone Encounter (Signed)
Doing telephone call

## 2019-04-05 ENCOUNTER — Other Ambulatory Visit: Payer: Self-pay

## 2019-04-06 ENCOUNTER — Ambulatory Visit (INDEPENDENT_AMBULATORY_CARE_PROVIDER_SITE_OTHER): Payer: Medicaid Other

## 2019-04-06 DIAGNOSIS — Z23 Encounter for immunization: Secondary | ICD-10-CM | POA: Diagnosis not present

## 2019-04-16 ENCOUNTER — Other Ambulatory Visit: Payer: Self-pay | Admitting: Family

## 2019-04-16 DIAGNOSIS — M5442 Lumbago with sciatica, left side: Secondary | ICD-10-CM

## 2019-05-24 ENCOUNTER — Encounter: Payer: Self-pay | Admitting: Family

## 2019-05-24 ENCOUNTER — Ambulatory Visit (INDEPENDENT_AMBULATORY_CARE_PROVIDER_SITE_OTHER): Payer: Medicaid Other | Admitting: Family

## 2019-05-24 DIAGNOSIS — M5441 Lumbago with sciatica, right side: Secondary | ICD-10-CM

## 2019-05-24 DIAGNOSIS — G8929 Other chronic pain: Secondary | ICD-10-CM

## 2019-05-24 DIAGNOSIS — J019 Acute sinusitis, unspecified: Secondary | ICD-10-CM | POA: Diagnosis not present

## 2019-05-24 DIAGNOSIS — M545 Low back pain: Secondary | ICD-10-CM | POA: Diagnosis not present

## 2019-05-24 MED ORDER — BUSPIRONE HCL 30 MG PO TABS: 30.0000 mg | ORAL_TABLET | Freq: Two times a day (BID) | ORAL | 2 refills | Status: DC

## 2019-05-24 MED ORDER — BENZONATATE 200 MG PO CAPS: 200.0000 mg | ORAL_CAPSULE | Freq: Three times a day (TID) | ORAL | 1 refills | Status: DC | PRN

## 2019-05-24 MED ORDER — TRAMADOL HCL 50 MG PO TABS: 50.0000 mg | ORAL_TABLET | Freq: Two times a day (BID) | ORAL | 2 refills | Status: DC | PRN

## 2019-05-24 MED ORDER — AMOXICILLIN-POT CLAVULANATE 875-125 MG PO TABS: 1.0000 | ORAL_TABLET | Freq: Two times a day (BID) | ORAL | 0 refills | Status: DC

## 2019-05-24 NOTE — Progress Notes (Signed)
Virtual Visit via telephone Note Due to COVID-19 pandemic this visit was conducted virtually. This visit type was conducted due to national recommendations for restrictions regarding the COVID-19 Pandemic (e.g. social distancing, sheltering in place) in an effort to limit this patient's exposure and mitigate transmission in our community. All issues noted in this document were discussed and addressed.  A physical exam was not performed with this format.  I connected with [name] on 05/24/19 at 12:00 pm by telephone and verified that I am speaking with the correct person using two identifiers. [name] is currently located at home and daughter is currently with her during visit. The provider, Evelina Dun, FNP is located in their office at time of visit.  I discussed the limitations, risks, security and privacy concerns of performing an evaluation and management service by telephone and the availability of in person appointments. I also discussed with the patient that there may be a patient responsible charge related to this service. The patient expressed understanding and agreed to proceed.   History and Present Illness:  Sinusitis This is a new problem. The current episode started 1 to 4 weeks ago. The problem has been waxing and waning since onset. There has been no fever. The pain is mild. Associated symptoms include congestion, coughing, headaches and sinus pressure. Pertinent negatives include no chills, ear pain, hoarse voice, shortness of breath, sneezing or sore throat. Past treatments include oral decongestants and acetaminophen.  Anxiety Presents for follow-up visit. Symptoms include decreased concentration, depressed mood, excessive worry, irritability, nervous/anxious behavior and restlessness. Patient reports no shortness of breath. Symptoms occur occasionally. The severity of symptoms is moderate. The quality of sleep is good.    Back Pain This is a chronic problem.  The current episode started more than 1 year ago. The problem occurs intermittently. The problem has been waxing and waning since onset. The pain is present in the lumbar spine. The quality of the pain is described as aching. The pain is at a severity of 8/10. The pain is moderate. Associated symptoms include headaches. Pertinent negatives include no bowel incontinence. She has tried analgesics for the symptoms. The treatment provided mild relief.      Review of Systems  Constitutional: Positive for irritability. Negative for chills.  HENT: Positive for congestion and sinus pressure. Negative for ear pain, hoarse voice, sneezing and sore throat.   Respiratory: Positive for cough. Negative for shortness of breath.   Gastrointestinal: Negative for bowel incontinence.  Musculoskeletal: Positive for back pain.  Neurological: Positive for headaches.  Psychiatric/Behavioral: Positive for decreased concentration. The patient is nervous/anxious.   All other systems reviewed and are negative.    Observations/Objective: No SOB or distress noted   Assessment and Plan: 1. Acute sinusitis, recurrence not specified, unspecified location - Take meds as prescribed - Use a cool mist humidifier  -Use saline nose sprays frequently -Force fluids -For any cough or congestion  Use plain Mucinex- regular strength or max strength is fine -For fever or aces or pains- take tylenol or ibuprofen. -Throat lozenges if help -Call if symptoms worsen or do not improve  - amoxicillin-clavulanate (AUGMENTIN) 875-125 MG tablet; Take 1 tablet by mouth 2 (two) times daily.  Dispense: 14 tablet; Refill: 0 - benzonatate (TESSALON) 200 MG capsule; Take 1 capsule (200 mg total) by mouth 3 (three) times daily as needed.  Dispense: 90 capsule; Refill: 1  2. Chronic right-sided low back pain with right-sided sciatica - traMADol (ULTRAM) 50 MG tablet;  Take 1 tablet (50 mg total) by mouth every 12 (twelve) hours as needed.   Dispense: 45 tablet; Refill: 2  3. Chronic bilateral low back pain without sciatica  - traMADol (ULTRAM) 50 MG tablet; Take 1 tablet (50 mg total) by mouth every 12 (twelve) hours as needed.  Dispense: 45 tablet; Refill: 2  Pt reviewed in [place] controlled database RTO in 3 months    I discussed the assessment and treatment plan with the patient. The patient was provided an opportunity to ask questions and all were answered. The patient agreed with the plan and demonstrated an understanding of the instructions.   The patient was advised to call back or seek an in-person evaluation if the symptoms worsen or if the condition fails to improve as anticipated.  The above assessment and management plan was discussed with the patient. The patient verbalized understanding of and has agreed to the management plan. Patient is aware to call the clinic if symptoms persist or worsen. Patient is aware when to return to the clinic for a follow-up visit. Patient educated on when it is appropriate to go to the emergency department.   Time call ended:  12:20 pm  I provided 20 minutes of non-face-to-face time during this encounter.    Evelina Dun, FNP

## 2019-06-09 ENCOUNTER — Other Ambulatory Visit: Payer: Self-pay | Admitting: Family

## 2019-06-09 DIAGNOSIS — M5442 Lumbago with sciatica, left side: Secondary | ICD-10-CM

## 2019-06-23 ENCOUNTER — Other Ambulatory Visit: Payer: Self-pay | Admitting: Family

## 2019-06-23 DIAGNOSIS — M818 Other osteoporosis without current pathological fracture: Secondary | ICD-10-CM

## 2019-06-23 DIAGNOSIS — G8929 Other chronic pain: Secondary | ICD-10-CM

## 2019-07-19 ENCOUNTER — Telehealth: Payer: Self-pay | Admitting: *Deleted

## 2019-07-19 NOTE — Telephone Encounter (Addendum)
Prior Auth for Tramadol 50mg -APPROVED till 01/15/20  PA# [id number]  [employer] opioid PA form filled out, OV notes printed and signed and faxed.  Pharmacy notified

## 2019-08-12 ENCOUNTER — Other Ambulatory Visit: Payer: Self-pay | Admitting: Family

## 2019-08-19 ENCOUNTER — Other Ambulatory Visit: Payer: Self-pay

## 2019-08-22 ENCOUNTER — Ambulatory Visit (INDEPENDENT_AMBULATORY_CARE_PROVIDER_SITE_OTHER): Payer: Medicaid Other | Admitting: Family

## 2019-08-22 ENCOUNTER — Encounter: Payer: Self-pay | Admitting: Family

## 2019-08-22 ENCOUNTER — Other Ambulatory Visit: Payer: Self-pay

## 2019-08-22 VITALS — BP 129/89 | HR 82 | Temp 97.7°F | Ht 66.0 in | Wt 121.8 lb

## 2019-08-22 DIAGNOSIS — I10 Essential (primary) hypertension: Secondary | ICD-10-CM | POA: Diagnosis not present

## 2019-08-22 DIAGNOSIS — F411 Generalized anxiety disorder: Secondary | ICD-10-CM

## 2019-08-22 DIAGNOSIS — M5441 Lumbago with sciatica, right side: Secondary | ICD-10-CM

## 2019-08-22 DIAGNOSIS — M545 Low back pain, unspecified: Secondary | ICD-10-CM

## 2019-08-22 DIAGNOSIS — Z79899 Other long term (current) drug therapy: Secondary | ICD-10-CM

## 2019-08-22 DIAGNOSIS — D509 Iron deficiency anemia, unspecified: Secondary | ICD-10-CM

## 2019-08-22 DIAGNOSIS — G8929 Other chronic pain: Secondary | ICD-10-CM

## 2019-08-22 DIAGNOSIS — K5900 Constipation, unspecified: Secondary | ICD-10-CM

## 2019-08-22 DIAGNOSIS — E782 Mixed hyperlipidemia: Secondary | ICD-10-CM

## 2019-08-22 DIAGNOSIS — F17200 Nicotine dependence, unspecified, uncomplicated: Secondary | ICD-10-CM

## 2019-08-22 DIAGNOSIS — M2550 Pain in unspecified joint: Secondary | ICD-10-CM

## 2019-08-22 MED ORDER — TRAMADOL HCL 50 MG PO TABS: 50.0000 mg | ORAL_TABLET | Freq: Two times a day (BID) | ORAL | 2 refills | Status: DC | PRN

## 2019-08-22 NOTE — Patient Instructions (Signed)
Health Maintenance, Female Adopting a healthy lifestyle and getting preventive care are important in promoting health and wellness. Ask your health care provider about:  The right schedule for you to have regular tests and exams.  Things you can do on your own to prevent diseases and keep yourself healthy. What should I know about diet, weight, and exercise? Eat a healthy diet   Eat a diet that includes plenty of vegetables, fruits, low-fat dairy products, and lean protein.  Do not eat a lot of foods that are high in solid fats, added sugars, or sodium. Maintain a healthy weight Body mass index (BMI) is used to identify weight problems. It estimates body fat based on height and weight. Your health care provider can help determine your BMI and help you achieve or maintain a healthy weight. Get regular exercise Get regular exercise. This is one of the most important things you can do for your health. Most adults should:  Exercise for at least 150 minutes each week. The exercise should increase your heart rate and make you sweat (moderate-intensity exercise).  Do strengthening exercises at least twice a week. This is in addition to the moderate-intensity exercise.  Spend less time sitting. Even light physical activity can be beneficial. Watch cholesterol and blood lipids Have your blood tested for lipids and cholesterol at 55 years of age, then have this test every 5 years. Have your cholesterol levels checked more often if:  Your lipid or cholesterol levels are high.  You are older than 55 years of age.  You are at high risk for heart disease. What should I know about cancer screening? Depending on your health history and family history, you may need to have cancer screening at various ages. This may include screening for:  Breast cancer.  Cervical cancer.  Colorectal cancer.  Skin cancer.  Lung cancer. What should I know about heart disease, diabetes, and high blood  pressure? Blood pressure and heart disease  High blood pressure causes heart disease and increases the risk of stroke. This is more likely to develop in people who have high blood pressure readings, are of African descent, or are overweight.  Have your blood pressure checked: ? Every 3-5 years if you are 18-39 years of age. ? Every year if you are 40 years old or older. Diabetes Have regular diabetes screenings. This checks your fasting blood sugar level. Have the screening done:  Once every three years after age 40 if you are at a normal weight and have a low risk for diabetes.  More often and at a younger age if you are overweight or have a high risk for diabetes. What should I know about preventing infection? Hepatitis B If you have a higher risk for hepatitis B, you should be screened for this virus. Talk with your health care provider to find out if you are at risk for hepatitis B infection. Hepatitis C Testing is recommended for:  Everyone born from 1945 through 1965.  Anyone with known risk factors for hepatitis C. Sexually transmitted infections (STIs)  Get screened for STIs, including gonorrhea and chlamydia, if: ? You are sexually active and are younger than 55 years of age. ? You are older than 55 years of age and your health care provider tells you that you are at risk for this type of infection. ? Your sexual activity has changed since you were last screened, and you are at increased risk for chlamydia or gonorrhea. Ask your health care provider if   you are at risk.  Ask your health care provider about whether you are at high risk for HIV. Your health care provider may recommend a prescription medicine to help prevent HIV infection. If you choose to take medicine to prevent HIV, you should first get tested for HIV. You should then be tested every 3 months for as long as you are taking the medicine. Pregnancy  If you are about to stop having your period (premenopausal) and  you may become pregnant, seek counseling before you get pregnant.  Take 400 to 800 micrograms (mcg) of folic acid every day if you become pregnant.  Ask for birth control (contraception) if you want to prevent pregnancy. Osteoporosis and menopause Osteoporosis is a disease in which the bones lose minerals and strength with aging. This can result in bone fractures. If you are 65 years old or older, or if you are at risk for osteoporosis and fractures, ask your health care provider if you should:  Be screened for bone loss.  Take a calcium or vitamin D supplement to lower your risk of fractures.  Be given hormone replacement therapy (HRT) to treat symptoms of menopause. Follow these instructions at home: Lifestyle  Do not use any products that contain nicotine or tobacco, such as cigarettes, e-cigarettes, and chewing tobacco. If you need help quitting, ask your health care provider.  Do not use street drugs.  Do not share needles.  Ask your health care provider for help if you need support or information about quitting drugs. Alcohol use  Do not drink alcohol if: ? Your health care provider tells you not to drink. ? You are pregnant, may be pregnant, or are planning to become pregnant.  If you drink alcohol: ? Limit how much you use to 0-1 drink a day. ? Limit intake if you are breastfeeding.  Be aware of how much alcohol is in your drink. In the [place] one drink equals one 12 oz bottle of beer (355 mL), one 5 oz glass of wine (148 mL), or one 1 oz glass of hard liquor (44 mL). General instructions  Schedule regular health, dental, and eye exams.  Stay current with your vaccines.  Tell your health care provider if: ? You often feel depressed. ? You have ever been abused or do not feel safe at home. Summary  Adopting a healthy lifestyle and getting preventive care are important in promoting health and wellness.  Follow your health care provider's instructions about healthy  diet, exercising, and getting tested or screened for diseases.  Follow your health care provider's instructions on monitoring your cholesterol and blood pressure. This information is not intended to replace advice given to you by your health care provider. Make sure you discuss any questions you have with your health care provider. Document Revised: 06/02/2018 Document Reviewed: 06/02/2018 [employer] Patient Education  2020 [employer]  

## 2019-08-22 NOTE — Progress Notes (Signed)
Subjective:    Patient ID: [name], female    DOB: 07/17/1965, 54 y.o.   MRN: [id number]  Chief Complaint  Patient presents with  . Medical Management of Chronic Issues    fall 6 weeks fell on left knee. When patient coughs hurts in back on left side.   . Medication Problem    Discuss buspar how she takes not sure it is working    PT presents to the office today for chronic follow up. Pt states her mother and father passed away Sep 04, 2018. She states she is anxious, but doing well.   PT requests to be checked for rheumatoid arthritis through lab work as she continues to have joint pain in her hands, back, and knees. No family hx of RA.  Hypertension This is a chronic problem. The current episode started more than 1 year ago. The problem has been resolved since onset. The problem is controlled. Associated symptoms include anxiety. Pertinent negatives include no malaise/fatigue, peripheral edema or shortness of breath. Risk factors for coronary artery disease include dyslipidemia, sedentary lifestyle and smoking/tobacco exposure. The current treatment provides moderate improvement. There is no history of kidney disease, CAD/MI, CVA or heart failure.  Back Pain This is a chronic problem. The current episode started more than 1 year ago. The problem occurs intermittently. The problem has been waxing and waning since onset. The pain is present in the lumbar spine. The pain is at a severity of 8/10. The pain is moderate. Pertinent negatives include no bladder incontinence, bowel incontinence, leg pain or weakness. She has tried analgesics for the symptoms. The treatment provided moderate relief.  Anxiety Presents for follow-up visit. Symptoms include depressed mood, excessive worry, insomnia, irritability, nervous/anxious behavior, panic and restlessness. Patient reports no shortness of breath. Symptoms occur occasionally. The severity of symptoms is moderate.   Her past medical history is  significant for anemia.  Anemia Presents for follow-up visit. There has been no bruising/bleeding easily or malaise/fatigue. There is no history of heart failure.  Constipation This is a chronic problem. The current episode started more than 1 year ago. The problem has been waxing and waning since onset. Associated symptoms include back pain. She has tried laxatives for the symptoms. The treatment provided mild relief.  Nicotine Dependence Presents for follow-up visit. Symptoms include insomnia and irritability. Her urge triggers include company of smokers. The symptoms have been stable. She smokes < 1/2 a pack of cigarettes per day.      Review of Systems  Constitutional: Positive for irritability. Negative for malaise/fatigue.  Respiratory: Negative for shortness of breath.   Gastrointestinal: Positive for constipation. Negative for bowel incontinence.  Genitourinary: Negative for bladder incontinence.  Musculoskeletal: Positive for back pain.  Neurological: Negative for weakness.  Hematological: Does not bruise/bleed easily.  Psychiatric/Behavioral: The patient is nervous/anxious and has insomnia.   All other systems reviewed and are negative.      Objective:   Physical Exam Vitals reviewed.  Constitutional:      General: She is not in acute distress.    Appearance: She is well-developed.  HENT:     Head: Normocephalic and atraumatic.     Right Ear: Tympanic membrane normal.     Left Ear: Tympanic membrane normal.  Eyes:     Pupils: Pupils are equal, round, and reactive to light.  Neck:     Thyroid: No thyromegaly.  Cardiovascular:     Rate and Rhythm: Normal rate and regular rhythm.  Heart sounds: Normal heart sounds. No murmur.  Pulmonary:     Effort: Pulmonary effort is normal. No respiratory distress.     Breath sounds: Normal breath sounds. No wheezing.  Abdominal:     General: Bowel sounds are normal. There is no distension.     Palpations: Abdomen is soft.       Tenderness: There is no abdominal tenderness.  Musculoskeletal:        General: No tenderness. Normal range of motion.     Cervical back: Normal range of motion and neck supple.  Skin:    General: Skin is warm and dry.  Neurological:     Mental Status: She is alert and oriented to person, place, and time.     Cranial Nerves: No cranial nerve deficit.     Deep Tendon Reflexes: Reflexes are normal and symmetric.  Psychiatric:        Behavior: Behavior normal.        Thought Content: Thought content normal.        Judgment: Judgment normal.       BP 129/89   Pulse 82   Temp 97.7 F (36.5 C) (Temporal)   Ht _0  (1.676 m)   Wt 121 lb 12.8 oz (55.2 kg)   SpO2 98%   BMI 19.66 kg/m      Assessment & Plan:  [name] comes in today with chief complaint of Medical Management of Chronic Issues (fall 6 weeks fell on left knee. When patient coughs hurts in back on left side. ) and Medication Problem (Discuss buspar how she takes not sure it is working )   Diagnosis and orders addressed:  1. Chronic right-sided low back pain with right-sided sciatica - traMADol (ULTRAM) 50 MG tablet; Take 1 tablet (50 mg total) by mouth every 12 (twelve) hours as needed.  Dispense: 45 tablet; Refill: 2 - CMP14+EGFR - CBC with Differential/Platelet - Rheumatoid factor  2. Chronic bilateral low back pain without sciatica  - traMADol (ULTRAM) 50 MG tablet; Take 1 tablet (50 mg total) by mouth every 12 (twelve) hours as needed.  Dispense: 45 tablet; Refill: 2 - CMP14+EGFR - CBC with Differential/Platelet  3. Essential hypertension, benign - CMP14+EGFR - CBC with Differential/Platelet  4. Iron deficiency anemia, unspecified iron deficiency anemia type - CMP14+EGFR - CBC with Differential/Platelet  5. Constipation, unspecified constipation type - CMP14+EGFR - CBC with Differential/Platelet  6. Current smoker - CMP14+EGFR - CBC with Differential/Platelet  7. GAD (generalized  anxiety disorder) - CMP14+EGFR - CBC with Differential/Platelet  8. Mixed hyperlipidemia - CMP14+EGFR - CBC with Differential/Platelet  9. Controlled substance agreement signed - traMADol (ULTRAM) 50 MG tablet; Take 1 tablet (50 mg total) by mouth every 12 (twelve) hours as needed.  Dispense: 45 tablet; Refill: 2 - CMP14+EGFR - CBC with Differential/Platelet  10. Arthralgia, unspecified joint - CMP14+EGFR - CBC with Differential/Platelet - Rheumatoid factor   Labs pending Pt reviewed in [place] Controlled database, contract and urine drug up dated today Health Maintenance reviewed Diet and exercise encouraged  Follow up plan: 3 months   Evelina Dun, FNP

## 2019-08-23 LAB — CMP14+EGFR
ALT: 22 IU/L (ref 0–32)
AST: 21 IU/L (ref 0–40)
Albumin/Globulin Ratio: 1.9 (ref 1.2–2.2)
Albumin: 4.1 g/dL (ref 3.8–4.9)
Alkaline Phosphatase: 58 IU/L (ref 39–117)
BUN/Creatinine Ratio: 12 (ref 9–23)
BUN: 9 mg/dL (ref 6–24)
Bilirubin Total: <0.2 mg/dL (ref 0.0–1.2)
CO2: 24 mmol/L (ref 20–29)
Calcium: 9.9 mg/dL (ref 8.7–10.2)
Chloride: 99 mmol/L (ref 96–106)
Creatinine, Ser: 0.78 mg/dL (ref 0.57–1.00)
GFR calc Af Amer: 100 mL/min/{1.73_m2} (ref >59)
GFR calc non Af Amer: 86 mL/min/{1.73_m2} (ref >59)
Globulin, Total: 2.2 g/dL (ref 1.5–4.5)
Glucose: 85 mg/dL (ref 65–99)
Potassium: 4.1 mmol/L (ref 3.5–5.2)
Sodium: 138 mmol/L (ref 134–144)
Total Protein: 6.3 g/dL (ref 6.0–8.5)

## 2019-08-23 LAB — CBC WITH DIFFERENTIAL/PLATELET
Basophils Absolute: 0 10*3/uL (ref 0.0–0.2)
Basos: 1 %
EOS (ABSOLUTE): 0.2 10*3/uL (ref 0.0–0.4)
Eos: 3 %
Hematocrit: 39 % (ref 34.0–46.6)
Hemoglobin: 12.9 g/dL (ref 11.1–15.9)
Immature Grans (Abs): 0 10*3/uL (ref 0.0–0.1)
Immature Granulocytes: 0 %
Lymphocytes Absolute: 2 10*3/uL (ref 0.7–3.1)
Lymphs: 30 %
MCH: 31.2 pg (ref 26.6–33.0)
MCHC: 33.1 g/dL (ref 31.5–35.7)
MCV: 94 fL (ref 79–97)
Monocytes Absolute: 0.9 10*3/uL (ref 0.1–0.9)
Monocytes: 13 %
Neutrophils Absolute: 3.6 10*3/uL (ref 1.4–7.0)
Neutrophils: 53 %
Platelets: 335 10*3/uL (ref 150–450)
RBC: 4.13 x10E6/uL (ref 3.77–5.28)
RDW: 12.5 % (ref 11.7–15.4)
WBC: 6.7 10*3/uL (ref 3.4–10.8)

## 2019-08-23 LAB — RHEUMATOID FACTOR: Rheumatoid fact SerPl-aCnc: 11.6 IU/mL (ref 0.0–13.9)

## 2019-08-24 LAB — TOXASSURE SELECT 13 (MW), URINE

## 2019-09-17 ENCOUNTER — Other Ambulatory Visit: Payer: Self-pay | Admitting: Family

## 2019-09-17 DIAGNOSIS — M818 Other osteoporosis without current pathological fracture: Secondary | ICD-10-CM

## 2019-10-29 ENCOUNTER — Other Ambulatory Visit: Payer: Self-pay | Admitting: Family

## 2019-10-29 DIAGNOSIS — E782 Mixed hyperlipidemia: Secondary | ICD-10-CM

## 2019-11-14 ENCOUNTER — Other Ambulatory Visit: Payer: Self-pay | Admitting: Family

## 2019-11-19 ENCOUNTER — Other Ambulatory Visit: Payer: Self-pay | Admitting: Family

## 2019-11-19 DIAGNOSIS — F411 Generalized anxiety disorder: Secondary | ICD-10-CM

## 2019-11-19 DIAGNOSIS — F320 Major depressive disorder, single episode, mild: Secondary | ICD-10-CM

## 2019-11-23 ENCOUNTER — Encounter: Payer: Self-pay | Admitting: Family

## 2019-11-23 ENCOUNTER — Ambulatory Visit: Payer: Medicaid Other | Admitting: Family

## 2019-11-25 ENCOUNTER — Other Ambulatory Visit: Payer: Self-pay | Admitting: Family

## 2019-11-25 DIAGNOSIS — G8929 Other chronic pain: Secondary | ICD-10-CM

## 2019-12-07 ENCOUNTER — Other Ambulatory Visit: Payer: Self-pay | Admitting: *Deleted

## 2019-12-07 DIAGNOSIS — M818 Other osteoporosis without current pathological fracture: Secondary | ICD-10-CM

## 2019-12-07 MED ORDER — BUSPIRONE HCL 30 MG PO TABS: 30.0000 mg | ORAL_TABLET | Freq: Two times a day (BID) | ORAL | 2 refills | Status: DC

## 2019-12-07 MED ORDER — ALENDRONATE SODIUM 70 MG PO TABS: ORAL_TABLET | ORAL | 0 refills | Status: DC

## 2019-12-15 ENCOUNTER — Other Ambulatory Visit: Payer: Self-pay | Admitting: Family

## 2019-12-15 DIAGNOSIS — M5442 Lumbago with sciatica, left side: Secondary | ICD-10-CM

## 2019-12-15 DIAGNOSIS — M818 Other osteoporosis without current pathological fracture: Secondary | ICD-10-CM

## 2019-12-28 ENCOUNTER — Other Ambulatory Visit: Payer: Self-pay | Admitting: Family

## 2019-12-28 DIAGNOSIS — Z79899 Other long term (current) drug therapy: Secondary | ICD-10-CM

## 2019-12-28 DIAGNOSIS — M5441 Lumbago with sciatica, right side: Secondary | ICD-10-CM

## 2019-12-28 DIAGNOSIS — M545 Low back pain, unspecified: Secondary | ICD-10-CM

## 2020-02-06 ENCOUNTER — Other Ambulatory Visit: Payer: Self-pay | Admitting: Family

## 2020-02-23 ENCOUNTER — Ambulatory Visit: Payer: Medicaid Other | Admitting: Family

## 2020-03-05 ENCOUNTER — Other Ambulatory Visit: Payer: Self-pay | Admitting: Family

## 2020-03-05 DIAGNOSIS — G8929 Other chronic pain: Secondary | ICD-10-CM

## 2020-03-07 ENCOUNTER — Other Ambulatory Visit: Payer: Self-pay | Admitting: Family

## 2020-03-07 DIAGNOSIS — M5442 Lumbago with sciatica, left side: Secondary | ICD-10-CM

## 2020-03-22 ENCOUNTER — Encounter: Payer: Self-pay | Admitting: Family

## 2020-03-22 ENCOUNTER — Other Ambulatory Visit: Payer: Self-pay

## 2020-03-22 ENCOUNTER — Ambulatory Visit (INDEPENDENT_AMBULATORY_CARE_PROVIDER_SITE_OTHER): Payer: Medicaid Other | Admitting: Family

## 2020-03-22 VITALS — BP 155/95 | HR 99 | Temp 98.3°F | Ht 66.0 in | Wt 126.2 lb

## 2020-03-22 DIAGNOSIS — M545 Low back pain, unspecified: Secondary | ICD-10-CM

## 2020-03-22 DIAGNOSIS — M5441 Lumbago with sciatica, right side: Secondary | ICD-10-CM

## 2020-03-22 DIAGNOSIS — D509 Iron deficiency anemia, unspecified: Secondary | ICD-10-CM

## 2020-03-22 DIAGNOSIS — Z23 Encounter for immunization: Secondary | ICD-10-CM

## 2020-03-22 DIAGNOSIS — E785 Hyperlipidemia, unspecified: Secondary | ICD-10-CM

## 2020-03-22 DIAGNOSIS — Z79899 Other long term (current) drug therapy: Secondary | ICD-10-CM | POA: Diagnosis not present

## 2020-03-22 DIAGNOSIS — I10 Essential (primary) hypertension: Secondary | ICD-10-CM | POA: Diagnosis not present

## 2020-03-22 DIAGNOSIS — G8929 Other chronic pain: Secondary | ICD-10-CM

## 2020-03-22 DIAGNOSIS — F17200 Nicotine dependence, unspecified, uncomplicated: Secondary | ICD-10-CM

## 2020-03-22 DIAGNOSIS — Z1159 Encounter for screening for other viral diseases: Secondary | ICD-10-CM

## 2020-03-22 DIAGNOSIS — Z8781 Personal history of (healed) traumatic fracture: Secondary | ICD-10-CM

## 2020-03-22 DIAGNOSIS — F411 Generalized anxiety disorder: Secondary | ICD-10-CM

## 2020-03-22 MED ORDER — CYCLOBENZAPRINE HCL 5 MG PO TABS: 5.0000 mg | ORAL_TABLET | Freq: Two times a day (BID) | ORAL | 2 refills | Status: DC | PRN

## 2020-03-22 MED ORDER — DULOXETINE HCL 60 MG PO CPEP: 60.0000 mg | ORAL_CAPSULE | Freq: Every day | ORAL | 3 refills | Status: DC

## 2020-03-22 MED ORDER — HYDROXYZINE PAMOATE 25 MG PO CAPS: 25.0000 mg | ORAL_CAPSULE | Freq: Three times a day (TID) | ORAL | 0 refills | Status: DC | PRN

## 2020-03-22 MED ORDER — TRAMADOL HCL 50 MG PO TABS: 50.0000 mg | ORAL_TABLET | Freq: Two times a day (BID) | ORAL | 5 refills | Status: DC | PRN

## 2020-03-22 NOTE — Progress Notes (Signed)
Subjective:    Patient ID: [name], female    DOB: 09-Jan-1966, 54 y.o.   MRN: [id number]  Chief Complaint  Patient presents with   Follow-up   PT presents to the office today for chronic follow up. Pt states her mother and father passed away 10-09-18. She states she is anxious. She states her and her daughter are fighting and had bed bugs. She states her anxiety is terrible.  Hypertension This is a chronic problem. The current episode started more than 1 year ago. The problem has been waxing and waning since onset. The problem is uncontrolled. Associated symptoms include anxiety and malaise/fatigue. Pertinent negatives include no chest pain, peripheral edema or shortness of breath. Risk factors for coronary artery disease include dyslipidemia and sedentary lifestyle. The current treatment provides moderate improvement. There is no history of kidney disease, CVA or heart failure.  Hyperlipidemia This is a chronic problem. The current episode started more than 1 year ago. The problem is controlled. Recent lipid tests were reviewed and are normal. She has no history of obesity. Pertinent negatives include no chest pain or shortness of breath. Current antihyperlipidemic treatment includes statins. The current treatment provides moderate improvement of lipids. Risk factors for coronary artery disease include hypertension, a sedentary lifestyle, post-menopausal, dyslipidemia and diabetes mellitus.  Anxiety Presents for follow-up visit. Symptoms include depressed mood, excessive worry, nervous/anxious behavior and restlessness. Patient reports no chest pain or shortness of breath. Symptoms occur occasionally. The severity of symptoms is moderate. The quality of sleep is good.   Her past medical history is significant for anemia.  Constipation This is a chronic problem. The current episode started more than 1 year ago. The problem has been waxing and waning since onset. Her stool frequency is 2 to  3 times per week. Associated symptoms include back pain. She has tried laxatives for the symptoms. The treatment provided moderate relief.  Anemia Presents for follow-up visit. Symptoms include malaise/fatigue. There has been no bruising/bleeding easily. There is no history of heart failure. Side effects of medications include fatigue.  Back Pain This is a chronic problem. The current episode started more than 1 year ago. The problem occurs intermittently. The problem has been waxing and waning since onset. The pain is present in the lumbar spine. The quality of the pain is described as aching. The pain is at a severity of 0/10. The pain is mild. Pertinent negatives include no chest pain, dysuria or numbness.      Review of Systems  Constitutional: Positive for malaise/fatigue.  Respiratory: Negative for shortness of breath.   Cardiovascular: Negative for chest pain.  Gastrointestinal: Positive for constipation.  Genitourinary: Negative for dysuria.  Musculoskeletal: Positive for back pain.  Neurological: Negative for numbness.  Hematological: Does not bruise/bleed easily.  Psychiatric/Behavioral: The patient is nervous/anxious.   All other systems reviewed and are negative.      Objective:   Physical Exam Vitals reviewed.  Constitutional:      General: She is not in acute distress.    Appearance: She is well-developed.  HENT:     Head: Normocephalic and atraumatic.     Right Ear: Tympanic membrane normal.     Left Ear: Tympanic membrane normal.  Eyes:     Pupils: Pupils are equal, round, and reactive to light.  Neck:     Thyroid: No thyromegaly.  Cardiovascular:     Rate and Rhythm: Normal rate and regular rhythm.     Heart sounds: Normal heart sounds.  No murmur heard.   Pulmonary:     Effort: Pulmonary effort is normal. No respiratory distress.     Breath sounds: Normal breath sounds. No wheezing.  Abdominal:     General: Bowel sounds are normal. There is no distension.      Palpations: Abdomen is soft.     Tenderness: There is no abdominal tenderness.  Musculoskeletal:        General: No tenderness. Normal range of motion.     Cervical back: Normal range of motion and neck supple.  Skin:    General: Skin is warm and dry.  Neurological:     Mental Status: She is alert and oriented to person, place, and time.     Cranial Nerves: No cranial nerve deficit.     Deep Tendon Reflexes: Reflexes are normal and symmetric.  Psychiatric:        Behavior: Behavior normal.        Thought Content: Thought content normal.        Judgment: Judgment normal.       BP (!) 155/95    Pulse 99    Temp 98.3 F (36.8 C)    Ht '5\' 6"'  (1.676 m)    Wt 126 lb 3.2 oz (57.2 kg)    SpO2 99%    BMI 20.37 kg/m      Assessment & Plan:  [name] comes in today with chief complaint of Follow-up   Diagnosis and orders addressed:  1. Need for immunization against influenza - Flu Vaccine QUAD 36+ mos IM - CMP14+EGFR - CBC with Differential/Platelet  2. Essential hypertension, benign - CMP14+EGFR - CBC with Differential/Platelet  3. Iron deficiency anemia, unspecified iron deficiency anemia type - CMP14+EGFR - CBC with Differential/Platelet  4. Chronic bilateral low back pain without sciatica - CMP14+EGFR - CBC with Differential/Platelet  5. Controlled substance agreement signed - CMP14+EGFR - CBC with Differential/Platelet  6. Current smoker - CMP14+EGFR - CBC with Differential/Platelet  7. GAD (generalized anxiety disorder) Continue Lexapro for 3 days then decrease to 10 mg for three days then stop. Start Cymbalta 60 mg today.  - CMP14+EGFR - CBC with Differential/Platelet - DULoxetine (CYMBALTA) 60 MG capsule; Take 1 capsule (60 mg total) by mouth daily.  Dispense: 90 capsule; Refill: 3 - hydrOXYzine (VISTARIL) 25 MG capsule; Take 1 capsule (25 mg total) by mouth 3 (three) times daily as needed.  Dispense: 30 capsule; Refill: 0  8. History of fracture  of patella - CMP14+EGFR - CBC with Differential/Platelet  9. Need for hepatitis C screening test - CMP14+EGFR - CBC with Differential/Platelet - Hepatitis C antibody  10. Hyperlipidemia, unspecified hyperlipidemia type - CMP14+EGFR - CBC with Differential/Platelet - Lipid panel  11. Chronic right-sided low back pain with right-sided sciatic - cyclobenzaprine (FLEXERIL) 5 MG tablet; Take 1 tablet (5 mg total) by mouth 2 (two) times daily as needed for muscle spasms.  Dispense: 60 tablet; Refill: 2   Labs pending Health Maintenance reviewed Diet and exercise encouraged  Follow up plan: 6 months    Evelina Dun, FNP

## 2020-03-22 NOTE — Patient Instructions (Signed)

## 2020-03-23 LAB — CBC WITH DIFFERENTIAL/PLATELET
Basophils Absolute: 0 10*3/uL (ref 0.0–0.2)
Basos: 1 %
EOS (ABSOLUTE): 0.2 10*3/uL (ref 0.0–0.4)
Eos: 4 %
Hematocrit: 39.6 % (ref 34.0–46.6)
Hemoglobin: 13.3 g/dL (ref 11.1–15.9)
Immature Grans (Abs): 0 10*3/uL (ref 0.0–0.1)
Immature Granulocytes: 1 %
Lymphocytes Absolute: 1.9 10*3/uL (ref 0.7–3.1)
Lymphs: 30 %
MCH: 31.6 pg (ref 26.6–33.0)
MCHC: 33.6 g/dL (ref 31.5–35.7)
MCV: 94 fL (ref 79–97)
Monocytes Absolute: 1 10*3/uL — ABNORMAL HIGH (ref 0.1–0.9)
Monocytes: 15 %
Neutrophils Absolute: 3.2 10*3/uL (ref 1.4–7.0)
Neutrophils: 49 %
Platelets: 375 10*3/uL (ref 150–450)
RBC: 4.21 x10E6/uL (ref 3.77–5.28)
RDW: 12.1 % (ref 11.7–15.4)
WBC: 6.4 10*3/uL (ref 3.4–10.8)

## 2020-03-23 LAB — CMP14+EGFR
ALT: 22 IU/L (ref 0–32)
AST: 24 IU/L (ref 0–40)
Albumin/Globulin Ratio: 1.8 (ref 1.2–2.2)
Albumin: 4.4 g/dL (ref 3.8–4.9)
Alkaline Phosphatase: 62 IU/L (ref 44–121)
BUN/Creatinine Ratio: 12 (ref 9–23)
BUN: 9 mg/dL (ref 6–24)
Bilirubin Total: <0.2 mg/dL (ref 0.0–1.2)
CO2: 28 mmol/L (ref 20–29)
Calcium: 9.8 mg/dL (ref 8.7–10.2)
Chloride: 100 mmol/L (ref 96–106)
Creatinine, Ser: 0.78 mg/dL (ref 0.57–1.00)
GFR calc Af Amer: 100 mL/min/{1.73_m2} (ref >59)
GFR calc non Af Amer: 86 mL/min/{1.73_m2} (ref >59)
Globulin, Total: 2.4 g/dL (ref 1.5–4.5)
Glucose: 83 mg/dL (ref 65–99)
Potassium: 4.2 mmol/L (ref 3.5–5.2)
Sodium: 141 mmol/L (ref 134–144)
Total Protein: 6.8 g/dL (ref 6.0–8.5)

## 2020-03-23 LAB — HEPATITIS C ANTIBODY: Hep C Virus Ab: <0.1 s/co ratio (ref 0.0–0.9)

## 2020-03-23 LAB — LIPID PANEL
Chol/HDL Ratio: 2.7 ratio (ref 0.0–4.4)
Cholesterol, Total: 167 mg/dL (ref 100–199)
HDL: 61 mg/dL (ref >39)
LDL Chol Calc (NIH): 82 mg/dL (ref 0–99)
Triglycerides: 138 mg/dL (ref 0–149)
VLDL Cholesterol Cal: 24 mg/dL (ref 5–40)

## 2020-04-02 ENCOUNTER — Other Ambulatory Visit: Payer: Self-pay | Admitting: Family

## 2020-04-02 DIAGNOSIS — F320 Major depressive disorder, single episode, mild: Secondary | ICD-10-CM

## 2020-04-02 DIAGNOSIS — M5442 Lumbago with sciatica, left side: Secondary | ICD-10-CM

## 2020-04-02 DIAGNOSIS — F411 Generalized anxiety disorder: Secondary | ICD-10-CM

## 2020-04-03 ENCOUNTER — Other Ambulatory Visit: Payer: Self-pay

## 2020-04-03 ENCOUNTER — Emergency Department (HOSPITAL_COMMUNITY): Payer: Medicaid Other

## 2020-04-03 ENCOUNTER — Emergency Department (HOSPITAL_COMMUNITY)
Admission: EM | Admit: 2020-04-03 | Discharge: 2020-04-04 | Disposition: A | Discharge status: Home / Self Care | Payer: Medicaid Other | Attending: Emergency Medicine | Admitting: Emergency Medicine

## 2020-04-03 ENCOUNTER — Encounter (HOSPITAL_COMMUNITY): Payer: Self-pay | Admitting: Student

## 2020-04-03 DIAGNOSIS — S99911A Unspecified injury of right ankle, initial encounter: Secondary | ICD-10-CM | POA: Insufficient documentation

## 2020-04-03 DIAGNOSIS — I10 Essential (primary) hypertension: Secondary | ICD-10-CM | POA: Insufficient documentation

## 2020-04-03 DIAGNOSIS — W19XXXA Unspecified fall, initial encounter: Secondary | ICD-10-CM

## 2020-04-03 DIAGNOSIS — R0789 Other chest pain: Secondary | ICD-10-CM | POA: Insufficient documentation

## 2020-04-03 DIAGNOSIS — F17210 Nicotine dependence, cigarettes, uncomplicated: Secondary | ICD-10-CM | POA: Diagnosis not present

## 2020-04-03 DIAGNOSIS — Y92838 Other recreation area as the place of occurrence of the external cause: Secondary | ICD-10-CM | POA: Insufficient documentation

## 2020-04-03 DIAGNOSIS — R079 Chest pain, unspecified: Secondary | ICD-10-CM

## 2020-04-03 DIAGNOSIS — M79671 Pain in right foot: Secondary | ICD-10-CM | POA: Insufficient documentation

## 2020-04-03 DIAGNOSIS — B370 Candidal stomatitis: Secondary | ICD-10-CM

## 2020-04-03 DIAGNOSIS — W08XXXA Fall from other furniture, initial encounter: Secondary | ICD-10-CM | POA: Diagnosis not present

## 2020-04-03 LAB — BASIC METABOLIC PANEL
Anion gap: 12 (ref 5–15)
BUN: 8 mg/dL (ref 6–20)
CO2: 28 mmol/L (ref 22–32)
Calcium: 9.5 mg/dL (ref 8.9–10.3)
Chloride: 102 mmol/L (ref 98–111)
Creatinine, Ser: 0.86 mg/dL (ref 0.44–1.00)
GFR, Estimated: >60 mL/min (ref >60)
Glucose, Bld: 96 mg/dL (ref 70–99)
Potassium: 3.6 mmol/L (ref 3.5–5.1)
Sodium: 142 mmol/L (ref 135–145)

## 2020-04-03 LAB — CBC
HCT: 41.1 % (ref 36.0–46.0)
Hemoglobin: 13.1 g/dL (ref 12.0–15.0)
MCH: 31.6 pg (ref 26.0–34.0)
MCHC: 31.9 g/dL (ref 30.0–36.0)
MCV: 99.3 fL (ref 80.0–100.0)
Platelets: 423 10*3/uL — ABNORMAL HIGH (ref 150–400)
RBC: 4.14 MIL/uL (ref 3.87–5.11)
RDW: 12.5 % (ref 11.5–15.5)
WBC: 9.7 10*3/uL (ref 4.0–10.5)
nRBC: 0 % (ref 0.0–0.2)

## 2020-04-03 LAB — TROPONIN I (HIGH SENSITIVITY)
Troponin I (High Sensitivity): 3 ng/L (ref <18)
Troponin I (High Sensitivity): 4 ng/L (ref <18)

## 2020-04-03 NOTE — ED Provider Notes (Signed)
[name]PART[facility] Provider Note   CSN: [id number] Arrival date & time: 04/03/20  1807     History Chief Complaint  Patient presents with  . Chest Pain  . Foot Pain    [name] is a 54 y.o. female with a history of tobacco abuse, hypertension, hyperlipidemia, anxiety, & anemia who presents to the ED with complaints of chest and right foot/ankle pain status post fall 3 days prior.  Patient states that she was stepping on to a stool when she missed the step causing her to fall onto her right foot and subsequently onto the ground.  She denies head injury or loss of consciousness.  She states since the fall she has had pain to her right posterior foot/ankle as well as to her right chest.  Pain is constant, worse with movement and palpation, no alleviating factors.  She denies any prodromal symptoms to fall, no chest pain, dyspnea, lightheadedness, or dizziness, states she simply misstepped.  She denies nausea, vomiting, diaphoresis, dyspnea, abdominal pain, headache, neck pain, back pain, numbness, tingling, or weakness.  She is not anticoagulated.  She also denies any calf pain/swelling, hormone use, hemoptysis, recent long travel/ or surgery, prior VTE, or prior cancer.  HPI     Past Medical History:  Diagnosis Date  . Anemia   . Anxiety   . Hyperlipidemia   . Hypertension   . Osteoporosis     Patient Active Problem List   Diagnosis Date Noted  . Controlled substance agreement signed 08/22/2019  . Chronic back pain 04/21/2017  . Current smoker 10/20/2016  . Osteoporosis 04/23/2016  . Anemia, iron deficiency 10/16/2015  . Constipation 10/16/2015  . History of fracture of patella   . GAD (generalized anxiety disorder) 03/06/2014  . Hyperlipidemia 03/06/2014  . Essential hypertension, benign 03/06/2014    Past Surgical History:  Procedure Laterality Date  . ABDOMINAL HYSTERECTOMY     Partial  . HARDWARE REMOVAL Left 06/20/2015    Procedure: HARDWARE REMOVAL;  Surgeon: Carole Civil, MD;  Location: [facility];  Service: Orthopedics;  Laterality: Left;  left knee  . ORIF PATELLA Left 12/29/2014   Procedure: OPEN REDUCTION INTERNAL FIXATION LEFT PATELLA;  Surgeon: Carole Civil, MD;  Location: [facility];  Service: Orthopedics;  Laterality: Left;     OB History   No obstetric history on file.     Family History  Problem Relation Age of Onset  . COPD Mother   . Osteoporosis Mother   . COPD Father   . Osteoporosis Father   . Colon cancer Neg Hx     Social History   Tobacco Use  . Smoking status: Current Every Day Smoker    Packs/day: 1.00    Years: 29.00    Pack years: 29.00  . Smokeless tobacco: Never Used  Vaping Use  . Vaping Use: Never used  Substance Use Topics  . Alcohol use: No    Alcohol/week: 0.0 standard drinks  . Drug use: No    Home Medications Prior to Admission medications   Medication Sig Start Date End Date Taking? Authorizing Provider  alendronate (FOSAMAX) 70 MG tablet TAKE 1 TABLET BY MOUTH ONCE WEEKLY WITH A FULL GLASS OF WATER ON AN EMPTY STOMACH. 12/15/19   Evelina Dun A, FNP  calcium-vitamin D (OSCAL WITH D) 500-200 MG-UNIT tablet Take 1 tablet by mouth daily. 04/23/16   Cherre Robins, PharmD  cyclobenzaprine (FLEXERIL) 5 MG tablet Take 1 tablet (5 mg total) by  mouth 2 (two) times daily as needed for muscle spasms. 03/22/20   Evelina Dun A, FNP  docusate sodium (COLACE) 100 MG capsule Take 100 mg by mouth daily.    [provider]  DULoxetine (CYMBALTA) 60 MG capsule Take 1 capsule (60 mg total) by mouth daily. 03/22/20   Evelina Dun A, FNP  fexofenadine (ALLER-EASE) 180 MG tablet Take 180 mg by mouth daily. OTC    [provider]  fluticasone (FLONASE) 50 MCG/ACT nasal spray SPRAY 2 SPRAYS INTO EACH NOSTRIL EVERY DAY 02/06/20   Evelina Dun A, FNP  hydrOXYzine (VISTARIL) 25 MG capsule Take 1 capsule (25 mg total) by mouth 3 (three) times daily as needed.  03/22/20   Evelina Dun A, FNP  naproxen (NAPROSYN) 500 MG tablet TAKE 1 TABLET (500 MG TOTAL) BY MOUTH 2 (TWO) TIMES DAILY WITH A MEAL. 04/02/20   Evelina Dun A, FNP  nystatin (MYCOSTATIN) 100000 UNIT/ML suspension Take 5 mLs (500,000 Units total) by mouth 4 (four) times daily. 02/22/19   Evelina Dun A, FNP  Simethicone (GAS-X EXTRA STRENGTH) 125 MG CAPS Take 1 capsule (125 mg total) by mouth daily. 04/21/17   Sharion Balloon, FNP  simvastatin (ZOCOR) 20 MG tablet TAKE 1 TABLET BY MOUTH EVERYDAY AT BEDTIME 10/31/19   Hawks, Alyse Low A, FNP  traMADol (ULTRAM) 50 MG tablet Take 1 tablet (50 mg total) by mouth every 12 (twelve) hours as needed. 03/22/20   Sharion Balloon, FNP    Allergies    Codeine and Hydrocodone  Review of Systems   Review of Systems  Constitutional: Negative for chills, diaphoresis and fever.  Eyes: Negative for visual disturbance.  Respiratory: Negative for shortness of breath.   Cardiovascular: Positive for chest pain. Negative for leg swelling.  Gastrointestinal: Negative for abdominal pain, blood in stool, constipation, diarrhea, nausea and vomiting.  Musculoskeletal: Positive for arthralgias. Negative for back pain and neck pain.  Neurological: Negative for dizziness, seizures, syncope, speech difficulty, weakness, light-headedness, numbness and headaches.  All other systems reviewed and are negative.   Physical Exam Updated Vital Signs BP 120/88 (BP Location: Left Arm)   Pulse 80   Temp 97.8 F (36.6 C) (Oral)   Resp 16   Ht 5\' 6"  (1.676 m)   Wt 57.6 kg   SpO2 100%   BMI 20.50 kg/m   Physical Exam Vitals and nursing note reviewed.  Constitutional:      General: She is not in acute distress.    Appearance: She is well-developed. She is not ill-appearing or toxic-appearing.  HENT:     Head: Normocephalic and atraumatic.     Comments: No raccoon eyes or battle sign. Eyes:     General:        Right eye: No discharge.        Left eye: No  discharge.     Conjunctiva/sclera: Conjunctivae normal.     Pupils: Pupils are equal, round, and reactive to light.  Cardiovascular:     Rate and Rhythm: Normal rate and regular rhythm.     Pulses:          Dorsalis pedis pulses are 2+ on the right side and 2+ on the left side.       Posterior tibial pulses are 2+ on the right side and 2+ on the left side.  Pulmonary:     Effort: Pulmonary effort is normal. No respiratory distress.     Breath sounds: Normal breath sounds. No wheezing, rhonchi or rales.  Chest:     Chest wall: Tenderness (Right anterior chest wall without overlying skin changes or palpable crepitus.  Patient states this reproduces her pain.) present.  Abdominal:     General: There is no distension.     Palpations: Abdomen is soft.     Tenderness: There is no abdominal tenderness.  Musculoskeletal:     Cervical back: Normal range of motion and neck supple. No spinous process tenderness.     Comments: Upper extremities: Intact active range of motion.  No focal bony tenderness. Back: No midline tenderness or palpable step-off.   Lower extremities: Patient has bruising to the dorsum medial plantar, and medial ankle of the right lower extremity.  There is no significant pitting edema.  There are no significant open wounds.  She has intact active range of motion throughout.  She is tender to palpation to the right medial malleolus as well as the medial ligament.  Lower extremities are otherwise nontender.  No calf tenderness to palpation.  Skin:    General: Skin is warm and dry.     Capillary Refill: Capillary refill takes less than 2 seconds.     Findings: No rash.  Neurological:     Mental Status: She is alert.     Comments: Alert. Clear speech. Sensation grossly intact to bilateral lower extremities. 5/5 strength with plantar/dorsiflexion bilaterally.   Psychiatric:        Mood and Affect: Mood normal.        Behavior: Behavior normal.     ED Results / Procedures /  Treatments   Labs (all labs ordered are listed, but only abnormal results are displayed) Labs Reviewed  CBC - Abnormal; Notable for the following components:      Result Value   Platelets 423 (*)    All other components within normal limits  BASIC METABOLIC PANEL  TROPONIN I (HIGH SENSITIVITY)  TROPONIN I (HIGH SENSITIVITY)    EKG EKG Interpretation  Date/Time:  Tuesday April 03 2020 18:10:21 EDT Ventricular Rate:  92 PR Interval:  118 QRS Duration: 80 QT Interval:  336 QTC Calculation: 415 R Axis:   75 Text Interpretation: Normal sinus rhythm Normal ECG Confirmed by Thayer Jew [phone]) on 04/03/2020 11:47:02 PM   Radiology DG Chest 2 View  Result Date: 04/03/2020 CLINICAL DATA:  Chest pain. EXAM: CHEST - 2 VIEW COMPARISON:  Chest radiograph 12/28/2014 FINDINGS: The heart size and mediastinal contours are within normal limits. Aortic atherosclerosis. Chronic hyperinflation. No consolidation. No pleural effusions or pneumothorax. No acute osseous abnormality. IMPRESSION: 1. No acute cardiopulmonary disease. 2. Chronic hyperinflation, suggesting COPD/emphysema. Electronically Signed   By: Margaretha Sheffield MD   On: 04/03/2020 18:41   DG Ankle Complete Right  Result Date: 04/04/2020 CLINICAL DATA:  Fall, right ankle pain EXAM: RIGHT ANKLE - COMPLETE 3+ VIEW COMPARISON:  None. FINDINGS: There is no evidence of fracture, dislocation, or joint effusion. There is no evidence of arthropathy or other focal bone abnormality. Soft tissues are unremarkable. IMPRESSION: Negative. Electronically Signed   By: Fidela Salisbury MD   On: 04/04/2020 01:16   DG Foot Complete Right  Result Date: 04/03/2020 CLINICAL DATA:  54 year old female with right foot pain. EXAM: RIGHT FOOT COMPLETE - 3+ VIEW COMPARISON:  None. FINDINGS: There is no acute fracture or dislocation. The bones are osteopenic. There is hallux valgus. There is soft tissue thickening of the forefoot. No radiopaque foreign  object or soft tissue gas. IMPRESSION: No acute fracture or dislocation. Electronically Signed  By: Anner Crete M.D.   On: 04/03/2020 18:41    Procedures Procedures (including critical care time)  Medications Ordered in ED Medications - No data to display  ED Course  I have reviewed the triage vital signs and the nursing notes.  Pertinent labs & imaging results that were available during my care of the patient were reviewed by me and considered in my medical decision making (see chart for details).    MDM Rules/Calculators/A&P                         Patient presents to the ED with complaints of chest discomfort & R ankle/foot pain S/p fall a few days prior. No prodromal sxs, mechanical fall. Chest pain did not develop until after fall. She is nontoxic, vitals WNL on my assessment initial elevated BP normalized. Chest pain reproducible w/ chest wall palpation. RLE w/ bruising and medial malleolar/medial ligament tenderness. NVI distally.   Additional history obtained:  Additional history obtained from chart review & nursing note review.  EKG: NSR, no STEMI.  Lab Tests:  I reviewed & interpreted labs, which included:  CBC, BMP, troponins: Fairly unremarkable, troponins are flat.   Imaging Studies ordered:  CXR & R foot xray ordered per triage, I subsequently ordered R ankle x-ray, I independently visualized and interpreted imaging which showed:  CXR: No acute process R foot x-ray: No acute fracture or dislocation R ankle x-ray: No acute fracture or dislocation.   Pain following fall with reproducibility on palpation- suspect MSK most likely cause of chest discomfort.  HEAR score 3- low risk, troponins are flat, EKG w/o ischemic changes- doubt ACS. Low risk wells- doubt PE. No widened mediastinum on CXR, symmetric pulses- doubt dissection Reassuring labs/CXR. In terms of RLE injury-x-rays without fracture/dislocation, neurovascular intact distally, exam is not consistent with  compartment syndrome at this time, she has no lower leg edema or calf tenderness to raise concern for DVT.  She has a Cam walker at home that she can use, will provide crutches in the emergency department.  It appears she was recently prescribed naproxen, encouraged that she take this as prescribed.  PCP/orthopedics follow-up. I discussed results, treatment plan, need for follow-up, and return precautions with the patient & her daughter @ bedside. Provided opportunity for questions, patient & her daughter confirmed understanding and are in agreement with plan.    Portions of this note were generated with [employer]. Dictation errors may occur despite best attempts at proofreading.  Final Clinical Impression(s) / ED Diagnoses Final diagnoses:  Fall, initial encounter  Injury of right ankle, initial encounter  Chest pain, unspecified type    Rx / DC Orders ED Discharge Orders    None       Amaryllis Dyke, PA-C 04/04/20 0131    Merryl Hacker, MD 04/07/20 [phone]

## 2020-04-03 NOTE — ED Triage Notes (Signed)
Pt here with reports of chest pain onset yesterday. Pt also wants evaluation of her R foot after falling Saturday and hitting her foot.

## 2020-04-04 ENCOUNTER — Emergency Department (HOSPITAL_COMMUNITY): Payer: Medicaid Other

## 2020-04-04 NOTE — Discharge Instructions (Addendum)
Please read and follow all provided instructions.  You have been seen today for chest pain and right foot/ankle pain after a fall.  Tests performed today include: X-rays of your foot, ankle, and your chest do not show any obvious fractures. EKG: Normal Labs: Overall reassuring  Home care instructions: -- *PRICE in the first 24-48 hours Protect (with brace, splint, sling), if given by your provider--- please use your boot at home Rest Ice- Do not apply ice pack directly to your skin, place towel or similar between your skin and ice/ice pack. Apply ice for 20 min, then remove for 40 min while awake Compression- Wear brace, elastic bandage, splint as directed by your provider Elevate affected extremity above the level of your heart when not walking around for the first 24-48 hours    Please use crutches and weight-bear as tolerated.  Medications:  Upon review of your medications it appears that your primary care provider has prescribed naproxen, please take this as prescribed to help with pain/swelling.   You make take Tylenol per over the counter dosing as well  We have prescribed you new medication(s) today. Discuss the medications prescribed today with your pharmacist as they can have adverse effects and interactions with your other medicines including over the counter and prescribed medications. Seek medical evaluation if you start to experience new or abnormal symptoms after taking one of these medicines, seek care immediately if you start to experience difficulty breathing, feeling of your throat closing, facial swelling, or rash as these could be indications of a more serious allergic reaction   Follow-up instructions: Please follow-up with your primary care provider or the provided orthopedic physician (bone specialist) if you continue to have significant pain within the next 3 days In this case you may have a more severe injury that requires further care.  Please also follow-up with  your primary care provider in general.  Return instructions:  Please return if your digits or extremity are numb or tingling, appear gray or blue, or you have severe pain (also elevate the extremity and loosen splint or wrap if you were given one) Please return if you have redness or fevers.  Please return if you have any calf or lower leg swelling/pain Please return to the Emergency Department if you experience new or worsening symptoms.  Please return if you have any other emergent concerns. Additional Information:  Your vital signs today were: BP 135/89   Pulse 88   Temp 97.9 F (36.6 C) (Oral)   Resp 18   Ht 5\' 6"  (1.676 m)   Wt 57.6 kg   SpO2 99%   BMI 20.50 kg/m  If your blood pressure (BP) was elevated above 135/85 this visit, please have this repeated by your doctor within one month. ---------------

## 2020-04-04 NOTE — ED Notes (Signed)
ED Provider at bedside. 

## 2020-04-11 ENCOUNTER — Other Ambulatory Visit: Payer: Self-pay | Admitting: Family

## 2020-04-11 DIAGNOSIS — F411 Generalized anxiety disorder: Secondary | ICD-10-CM

## 2020-04-26 ENCOUNTER — Other Ambulatory Visit: Payer: Self-pay | Admitting: Family

## 2020-04-26 DIAGNOSIS — M818 Other osteoporosis without current pathological fracture: Secondary | ICD-10-CM

## 2020-04-26 DIAGNOSIS — F320 Major depressive disorder, single episode, mild: Secondary | ICD-10-CM

## 2020-04-26 DIAGNOSIS — F411 Generalized anxiety disorder: Secondary | ICD-10-CM

## 2020-05-16 ENCOUNTER — Other Ambulatory Visit: Payer: Self-pay | Admitting: Family

## 2020-05-16 DIAGNOSIS — M5442 Lumbago with sciatica, left side: Secondary | ICD-10-CM

## 2020-05-16 DIAGNOSIS — M818 Other osteoporosis without current pathological fracture: Secondary | ICD-10-CM

## 2020-06-12 ENCOUNTER — Other Ambulatory Visit: Payer: Self-pay | Admitting: Family

## 2020-06-12 DIAGNOSIS — F411 Generalized anxiety disorder: Secondary | ICD-10-CM

## 2020-06-12 DIAGNOSIS — F320 Major depressive disorder, single episode, mild: Secondary | ICD-10-CM

## 2020-06-22 ENCOUNTER — Other Ambulatory Visit: Payer: Self-pay | Admitting: Family

## 2020-06-22 DIAGNOSIS — E782 Mixed hyperlipidemia: Secondary | ICD-10-CM

## 2020-07-31 ENCOUNTER — Other Ambulatory Visit: Payer: Self-pay | Admitting: Family

## 2020-07-31 ENCOUNTER — Other Ambulatory Visit: Payer: Self-pay | Admitting: Family Medicine

## 2020-07-31 DIAGNOSIS — G8929 Other chronic pain: Secondary | ICD-10-CM

## 2020-07-31 DIAGNOSIS — M818 Other osteoporosis without current pathological fracture: Secondary | ICD-10-CM

## 2020-09-06 ENCOUNTER — Other Ambulatory Visit: Payer: Self-pay | Admitting: Family

## 2020-09-06 DIAGNOSIS — M818 Other osteoporosis without current pathological fracture: Secondary | ICD-10-CM

## 2020-09-20 ENCOUNTER — Ambulatory Visit: Payer: Medicaid Other | Admitting: Family

## 2020-10-02 ENCOUNTER — Other Ambulatory Visit: Payer: Self-pay | Admitting: Family

## 2020-10-05 ENCOUNTER — Other Ambulatory Visit: Payer: Self-pay | Admitting: Family

## 2020-10-05 DIAGNOSIS — M5442 Lumbago with sciatica, left side: Secondary | ICD-10-CM

## 2020-10-09 ENCOUNTER — Ambulatory Visit: Payer: Medicaid Other | Admitting: Family

## 2020-10-20 ENCOUNTER — Other Ambulatory Visit: Payer: Self-pay | Admitting: Family

## 2020-10-20 DIAGNOSIS — F411 Generalized anxiety disorder: Secondary | ICD-10-CM

## 2020-10-24 ENCOUNTER — Other Ambulatory Visit: Payer: Self-pay | Admitting: Family

## 2020-10-24 DIAGNOSIS — G8929 Other chronic pain: Secondary | ICD-10-CM

## 2020-10-24 DIAGNOSIS — Z79899 Other long term (current) drug therapy: Secondary | ICD-10-CM

## 2020-10-24 DIAGNOSIS — M5441 Lumbago with sciatica, right side: Secondary | ICD-10-CM

## 2020-12-20 ENCOUNTER — Encounter: Payer: Self-pay | Admitting: Family

## 2020-12-20 ENCOUNTER — Other Ambulatory Visit: Payer: Self-pay

## 2020-12-20 ENCOUNTER — Ambulatory Visit (INDEPENDENT_AMBULATORY_CARE_PROVIDER_SITE_OTHER): Payer: Medicaid Other | Admitting: Family

## 2020-12-20 VITALS — BP 148/89 | HR 66 | Temp 97.9°F | Ht 66.0 in | Wt 130.0 lb

## 2020-12-20 DIAGNOSIS — G8929 Other chronic pain: Secondary | ICD-10-CM

## 2020-12-20 DIAGNOSIS — M545 Low back pain, unspecified: Secondary | ICD-10-CM | POA: Diagnosis not present

## 2020-12-20 DIAGNOSIS — F411 Generalized anxiety disorder: Secondary | ICD-10-CM

## 2020-12-20 DIAGNOSIS — F17200 Nicotine dependence, unspecified, uncomplicated: Secondary | ICD-10-CM

## 2020-12-20 DIAGNOSIS — I10 Essential (primary) hypertension: Secondary | ICD-10-CM

## 2020-12-20 DIAGNOSIS — E782 Mixed hyperlipidemia: Secondary | ICD-10-CM

## 2020-12-20 DIAGNOSIS — Z23 Encounter for immunization: Secondary | ICD-10-CM

## 2020-12-20 DIAGNOSIS — D509 Iron deficiency anemia, unspecified: Secondary | ICD-10-CM

## 2020-12-20 DIAGNOSIS — K5900 Constipation, unspecified: Secondary | ICD-10-CM

## 2020-12-20 DIAGNOSIS — M5441 Lumbago with sciatica, right side: Secondary | ICD-10-CM

## 2020-12-20 MED ORDER — CYCLOBENZAPRINE HCL 5 MG PO TABS: 5.0000 mg | ORAL_TABLET | Freq: Two times a day (BID) | ORAL | 4 refills | Status: DC | PRN

## 2020-12-20 MED ORDER — SIMVASTATIN 20 MG PO TABS: 20.0000 mg | ORAL_TABLET | Freq: Every day | ORAL | 0 refills | Status: DC

## 2020-12-20 MED ORDER — HYDROXYZINE PAMOATE 25 MG PO CAPS: ORAL_CAPSULE | ORAL | 2 refills | Status: DC

## 2020-12-20 MED ORDER — BUPROPION HCL ER (XL) 150 MG PO TB24: 150.0000 mg | ORAL_TABLET | Freq: Every day | ORAL | 1 refills | Status: DC

## 2020-12-20 MED ORDER — IBUPROFEN 600 MG PO TABS: 600.0000 mg | ORAL_TABLET | Freq: Three times a day (TID) | ORAL | 1 refills | Status: DC | PRN

## 2020-12-20 MED ORDER — DULOXETINE HCL 60 MG PO CPEP: 60.0000 mg | ORAL_CAPSULE | Freq: Every day | ORAL | 3 refills | Status: DC

## 2020-12-20 NOTE — Patient Instructions (Signed)

## 2020-12-20 NOTE — Progress Notes (Signed)
Subjective:    Patient ID: [name], female    DOB: 17-Apr-1966, 55 y.o.   MRN: [id number]  Chief Complaint  Patient presents with   Medical Management of Chronic Issues    Been under a lot of stress   PT presents to the office today for chronic follow up. Pt states her mother and father passed away 10-17-18. She states she is anxious, but doing well.  Hypertension This is a chronic problem. The current episode started more than 1 year ago. The problem has been resolved since onset. The problem is controlled. Associated symptoms include anxiety and malaise/fatigue. Pertinent negatives include no peripheral edema or shortness of breath. Risk factors for coronary artery disease include dyslipidemia and sedentary lifestyle. The current treatment provides moderate improvement.  Hyperlipidemia This is a chronic problem. The current episode started more than 1 year ago. Pertinent negatives include no shortness of breath. Current antihyperlipidemic treatment includes statins. The current treatment provides moderate improvement of lipids. Risk factors for coronary artery disease include dyslipidemia, hypertension and a sedentary lifestyle.  Anxiety Presents for follow-up visit. Symptoms include depressed mood, excessive worry, irritability, nervous/anxious behavior and restlessness. Patient reports no compulsions or shortness of breath. Symptoms occur most days. The severity of symptoms is moderate.   Her past medical history is significant for anemia.  Nicotine Dependence Presents for follow-up visit. Symptoms include irritability. Her urge triggers include company of smokers. The symptoms have been stable. She smokes < 1/2 a pack of cigarettes per day.  Constipation This is a chronic problem. The current episode started more than 1 year ago. The problem has been resolved since onset. Her stool frequency is 1 time per day. She has tried laxatives for the symptoms. The treatment provided moderate  relief.  Anemia Presents for follow-up visit. Symptoms include malaise/fatigue.     Review of Systems  Constitutional:  Positive for irritability and malaise/fatigue.  Respiratory:  Negative for shortness of breath.   Gastrointestinal:  Positive for constipation.  Psychiatric/Behavioral:  The patient is nervous/anxious.   All other systems reviewed and are negative.     Objective:   Physical Exam Vitals reviewed.  Constitutional:      General: She is not in acute distress.    Appearance: She is well-developed.  HENT:     Head: Normocephalic and atraumatic.     Right Ear: Tympanic membrane normal.     Left Ear: Tympanic membrane normal.  Eyes:     Pupils: Pupils are equal, round, and reactive to light.  Neck:     Thyroid: No thyromegaly.  Cardiovascular:     Rate and Rhythm: Normal rate and regular rhythm.     Heart sounds: Normal heart sounds. No murmur heard. Pulmonary:     Effort: Pulmonary effort is normal. No respiratory distress.     Breath sounds: Normal breath sounds. No wheezing.  Abdominal:     General: Bowel sounds are normal. There is no distension.     Palpations: Abdomen is soft.     Tenderness: There is no abdominal tenderness.  Musculoskeletal:        General: No tenderness. Normal range of motion.     Cervical back: Normal range of motion and neck supple.  Skin:    General: Skin is warm and dry.  Neurological:     Mental Status: She is alert and oriented to person, place, and time.     Cranial Nerves: No cranial nerve deficit.     Deep Tendon  Reflexes: Reflexes are normal and symmetric.  Psychiatric:        Behavior: Behavior normal.        Thought Content: Thought content normal.        Judgment: Judgment normal.         BP (!) 148/89   Pulse 66   Temp 97.9 F (36.6 C) (Temporal)   Ht '5\' 6"'  (1.676 m)   Wt 130 lb (59 kg)   BMI 20.98 kg/m   Assessment & Plan:  BLESSEN Dixon comes in today with chief complaint of Medical Management of  Chronic Issues (Been under a lot of stress)   Diagnosis and orders addressed:  1. Mixed hyperlipidemia - simvastatin (ZOCOR) 20 MG tablet; Take 1 tablet (20 mg total) by mouth daily at 6 PM.  Dispense: 90 tablet; Refill: 0 - CMP14+EGFR - CBC with Differential/Platelet  2. Essential hypertension, benign - CMP14+EGFR - CBC with Differential/Platelet  3. Current smoker - CMP14+EGFR - CBC with Differential/Platelet  4. Chronic bilateral low back pain without sciatica - CMP14+EGFR - CBC with Differential/Platelet - TSH  5. GAD (generalized anxiety disorder) Will add Wellbutrin 150 mg and stop Buspar  - DULoxetine (CYMBALTA) 60 MG capsule; Take 1 capsule (60 mg total) by mouth daily.  Dispense: 90 capsule; Refill: 3 - buPROPion (WELLBUTRIN XL) 150 MG 24 hr tablet; Take 1 tablet (150 mg total) by mouth daily.  Dispense: 90 tablet; Refill: 1 - hydrOXYzine (VISTARIL) 25 MG capsule; TAKE 1 CAPSULE BY MOUTH 3 TIMES DAILY AS NEEDED.  Dispense: 90 capsule; Refill: 2 - CMP14+EGFR - CBC with Differential/Platelet  6. Iron deficiency anemia, unspecified iron deficiency anemia type - CMP14+EGFR - CBC with Differential/Platelet  7. Constipation, unspecified constipation type - CMP14+EGFR - CBC with Differential/Platelet  8. Chronic right-sided low back pain with right-sided sciatica - cyclobenzaprine (FLEXERIL) 5 MG tablet; Take 1 tablet (5 mg total) by mouth 2 (two) times daily as needed for muscle spasms.  Dispense: 42 tablet; Refill: 4 - CMP14+EGFR - CBC with Differential/Platelet   Labs pending Health Maintenance reviewed Diet and exercise encouraged  Follow up plan: 3 months    Evelina Dun, FNP

## 2020-12-21 LAB — CMP14+EGFR
ALT: 10 IU/L (ref 0–32)
AST: 16 IU/L (ref 0–40)
Albumin/Globulin Ratio: 1.8 (ref 1.2–2.2)
Albumin: 4.4 g/dL (ref 3.8–4.9)
Alkaline Phosphatase: 72 IU/L (ref 44–121)
BUN/Creatinine Ratio: 11 (ref 9–23)
BUN: 8 mg/dL (ref 6–24)
Bilirubin Total: <0.2 mg/dL (ref 0.0–1.2)
CO2: 23 mmol/L (ref 20–29)
Calcium: 9.6 mg/dL (ref 8.7–10.2)
Chloride: 100 mmol/L (ref 96–106)
Creatinine, Ser: 0.7 mg/dL (ref 0.57–1.00)
Globulin, Total: 2.4 g/dL (ref 1.5–4.5)
Glucose: 93 mg/dL (ref 65–99)
Potassium: 4.2 mmol/L (ref 3.5–5.2)
Sodium: 138 mmol/L (ref 134–144)
Total Protein: 6.8 g/dL (ref 6.0–8.5)
eGFR: 102 mL/min/{1.73_m2} (ref >59)

## 2020-12-21 LAB — CBC WITH DIFFERENTIAL/PLATELET
Basophils Absolute: 0 10*3/uL (ref 0.0–0.2)
Basos: 1 %
EOS (ABSOLUTE): 0.1 10*3/uL (ref 0.0–0.4)
Eos: 1 %
Hematocrit: 40.3 % (ref 34.0–46.6)
Hemoglobin: 13.4 g/dL (ref 11.1–15.9)
Immature Grans (Abs): 0 10*3/uL (ref 0.0–0.1)
Immature Granulocytes: 0 %
Lymphocytes Absolute: 2.7 10*3/uL (ref 0.7–3.1)
Lymphs: 32 %
MCH: 31.6 pg (ref 26.6–33.0)
MCHC: 33.3 g/dL (ref 31.5–35.7)
MCV: 95 fL (ref 79–97)
Monocytes Absolute: 1 10*3/uL — ABNORMAL HIGH (ref 0.1–0.9)
Monocytes: 12 %
Neutrophils Absolute: 4.5 10*3/uL (ref 1.4–7.0)
Neutrophils: 54 %
Platelets: 388 10*3/uL (ref 150–450)
RBC: 4.24 x10E6/uL (ref 3.77–5.28)
RDW: 12.3 % (ref 11.7–15.4)
WBC: 8.3 10*3/uL (ref 3.4–10.8)

## 2020-12-21 LAB — TSH: TSH: 1.01 u[IU]/mL (ref 0.450–4.500)

## 2020-12-26 ENCOUNTER — Other Ambulatory Visit: Payer: Self-pay | Admitting: Family

## 2020-12-26 DIAGNOSIS — Z1231 Encounter for screening mammogram for malignant neoplasm of breast: Secondary | ICD-10-CM

## 2021-02-17 ENCOUNTER — Other Ambulatory Visit: Payer: Self-pay | Admitting: Family

## 2021-02-20 ENCOUNTER — Other Ambulatory Visit: Payer: Self-pay

## 2021-02-20 ENCOUNTER — Ambulatory Visit
Admission: RE | Admit: 2021-02-20 | Discharge: 2021-02-20 | Disposition: A | Discharge status: Home / Self Care | Payer: Medicaid Other | Source: Ambulatory Visit | Attending: Family | Admitting: Family

## 2021-02-20 DIAGNOSIS — Z1231 Encounter for screening mammogram for malignant neoplasm of breast: Secondary | ICD-10-CM

## 2021-03-12 ENCOUNTER — Other Ambulatory Visit: Payer: Self-pay | Admitting: Family

## 2021-03-22 ENCOUNTER — Ambulatory Visit: Payer: Medicaid Other | Admitting: Family

## 2021-03-22 ENCOUNTER — Other Ambulatory Visit: Payer: Self-pay

## 2021-03-22 ENCOUNTER — Encounter: Payer: Self-pay | Admitting: Family

## 2021-03-22 VITALS — BP 126/83 | HR 82 | Temp 97.5°F | Ht 66.0 in | Wt 128.6 lb

## 2021-03-22 DIAGNOSIS — D509 Iron deficiency anemia, unspecified: Secondary | ICD-10-CM

## 2021-03-22 DIAGNOSIS — F411 Generalized anxiety disorder: Secondary | ICD-10-CM | POA: Diagnosis not present

## 2021-03-22 DIAGNOSIS — Z23 Encounter for immunization: Secondary | ICD-10-CM

## 2021-03-22 DIAGNOSIS — G8929 Other chronic pain: Secondary | ICD-10-CM

## 2021-03-22 DIAGNOSIS — M545 Low back pain, unspecified: Secondary | ICD-10-CM

## 2021-03-22 DIAGNOSIS — E782 Mixed hyperlipidemia: Secondary | ICD-10-CM

## 2021-03-22 DIAGNOSIS — F17200 Nicotine dependence, unspecified, uncomplicated: Secondary | ICD-10-CM | POA: Diagnosis not present

## 2021-03-22 DIAGNOSIS — I10 Essential (primary) hypertension: Secondary | ICD-10-CM

## 2021-03-22 DIAGNOSIS — M818 Other osteoporosis without current pathological fracture: Secondary | ICD-10-CM | POA: Diagnosis not present

## 2021-03-22 DIAGNOSIS — K5900 Constipation, unspecified: Secondary | ICD-10-CM

## 2021-03-22 MED ORDER — ALENDRONATE SODIUM 70 MG PO TABS: ORAL_TABLET | ORAL | 1 refills | Status: DC

## 2021-03-22 NOTE — Patient Instructions (Signed)
Health Maintenance, Female Adopting a healthy lifestyle and getting preventive care are important in promoting health and wellness. Ask your health care provider about: The right schedule for you to have regular tests and exams. Things you can do on your own to prevent diseases and keep yourself healthy. What should I know about diet, weight, and exercise? Eat a healthy diet  Eat a diet that includes plenty of vegetables, fruits, low-fat dairy products, and lean protein. Do not eat a lot of foods that are high in solid fats, added sugars, or sodium. Maintain a healthy weight Body mass index (BMI) is used to identify weight problems. It estimates body fat based on height and weight. Your health care provider can help determine your BMI and help you achieve or maintain a healthy weight. Get regular exercise Get regular exercise. This is one of the most important things you can do for your health. Most adults should: Exercise for at least 150 minutes each week. The exercise should increase your heart rate and make you sweat (moderate-intensity exercise). Do strengthening exercises at least twice a week. This is in addition to the moderate-intensity exercise. Spend less time sitting. Even light physical activity can be beneficial. Watch cholesterol and blood lipids Have your blood tested for lipids and cholesterol at 56 years of age, then have this test every 5 years. Have your cholesterol levels checked more often if: Your lipid or cholesterol levels are high. You are older than 56 years of age. You are at high risk for heart disease. What should I know about cancer screening? Depending on your health history and family history, you may need to have cancer screening at various ages. This may include screening for: Breast cancer. Cervical cancer. Colorectal cancer. Skin cancer. Lung cancer. What should I know about heart disease, diabetes, and high blood pressure? Blood pressure and heart  disease High blood pressure causes heart disease and increases the risk of stroke. This is more likely to develop in people who have high blood pressure readings, are of African descent, or are overweight. Have your blood pressure checked: Every 3-5 years if you are 18-39 years of age. Every year if you are 40 years old or older. Diabetes Have regular diabetes screenings. This checks your fasting blood sugar level. Have the screening done: Once every three years after age 40 if you are at a normal weight and have a low risk for diabetes. More often and at a younger age if you are overweight or have a high risk for diabetes. What should I know about preventing infection? Hepatitis B If you have a higher risk for hepatitis B, you should be screened for this virus. Talk with your health care provider to find out if you are at risk for hepatitis B infection. Hepatitis C Testing is recommended for: Everyone born from 1945 through 1965. Anyone with known risk factors for hepatitis C. Sexually transmitted infections (STIs) Get screened for STIs, including gonorrhea and chlamydia, if: You are sexually active and are younger than 56 years of age. You are older than 56 years of age and your health care provider tells you that you are at risk for this type of infection. Your sexual activity has changed since you were last screened, and you are at increased risk for chlamydia or gonorrhea. Ask your health care provider if you are at risk. Ask your health care provider about whether you are at high risk for HIV. Your health care provider may recommend a prescription medicine   to help prevent HIV infection. If you choose to take medicine to prevent HIV, you should first get tested for HIV. You should then be tested every 3 months for as long as you are taking the medicine. Pregnancy If you are about to stop having your period (premenopausal) and you may become pregnant, seek counseling before you get  pregnant. Take 400 to 800 micrograms (mcg) of folic acid every day if you become pregnant. Ask for birth control (contraception) if you want to prevent pregnancy. Osteoporosis and menopause Osteoporosis is a disease in which the bones lose minerals and strength with aging. This can result in bone fractures. If you are 65 years old or older, or if you are at risk for osteoporosis and fractures, ask your health care provider if you should: Be screened for bone loss. Take a calcium or vitamin D supplement to lower your risk of fractures. Be given hormone replacement therapy (HRT) to treat symptoms of menopause. Follow these instructions at home: Lifestyle Do not use any products that contain nicotine or tobacco, such as cigarettes, e-cigarettes, and chewing tobacco. If you need help quitting, ask your health care provider. Do not use street drugs. Do not share needles. Ask your health care provider for help if you need support or information about quitting drugs. Alcohol use Do not drink alcohol if: Your health care provider tells you not to drink. You are pregnant, may be pregnant, or are planning to become pregnant. If you drink alcohol: Limit how much you use to 0-1 drink a day. Limit intake if you are breastfeeding. Be aware of how much alcohol is in your drink. In the [place] one drink equals one 12 oz bottle of beer (355 mL), one 5 oz glass of wine (148 mL), or one 1 oz glass of hard liquor (44 mL). General instructions Schedule regular health, dental, and eye exams. Stay current with your vaccines. Tell your health care provider if: You often feel depressed. You have ever been abused or do not feel safe at home. Summary Adopting a healthy lifestyle and getting preventive care are important in promoting health and wellness. Follow your health care provider's instructions about healthy diet, exercising, and getting tested or screened for diseases. Follow your health care provider's  instructions on monitoring your cholesterol and blood pressure. This information is not intended to replace advice given to you by your health care provider. Make sure you discuss any questions you have with your health care provider. Document Revised: 08/17/2020 Document Reviewed: 06/02/2018 Elsevier Patient Education  2022 Elsevier Inc.  

## 2021-03-22 NOTE — Progress Notes (Signed)
Subjective:    Patient ID: [name], female    DOB: [birth year], 55 y.o.   MRN: [id number]  Chief Complaint  Patient presents with   Medical Management of Chronic Issues   PT presents to the office today for chronic follow up. Pt states her mother and father passed away 09/23/2018. She states she is anxious, but doing well. She reports she is doing better.  Hypertension This is a chronic problem. The current episode started more than 1 year ago. The problem has been resolved since onset. The problem is controlled. Associated symptoms include anxiety and malaise/fatigue. Pertinent negatives include no peripheral edema or shortness of breath. Risk factors for coronary artery disease include dyslipidemia. The current treatment provides moderate improvement.  Hyperlipidemia This is a chronic problem. The current episode started more than 1 year ago. She has no history of nephrotic syndrome. Pertinent negatives include no shortness of breath. Current antihyperlipidemic treatment includes statins. The current treatment provides moderate improvement of lipids. Risk factors for coronary artery disease include dyslipidemia.  Anxiety Presents for follow-up visit. Symptoms include depressed mood, excessive worry, irritability and nervous/anxious behavior. Patient reports no shortness of breath. Symptoms occur most days.   Her past medical history is significant for anemia.  Anemia Presents for follow-up visit. Symptoms include malaise/fatigue.  Back Pain This is a chronic problem. The current episode started more than 1 year ago. The problem occurs intermittently. The problem has been waxing and waning since onset. The pain is present in the lumbar spine. The quality of the pain is described as aching. The pain is at a severity of 0/10 (today 0, but can be a 5-7 out 10). The patient is experiencing no pain. The treatment provided no relief.  Nicotine Dependence Presents for follow-up visit. Symptoms  include irritability. Her urge triggers include company of smokers. The symptoms have been stable. She smokes < 1/2 a pack of cigarettes per day.  Constipation This is a chronic problem. The current episode started more than 1 year ago. The problem has been waxing and waning since onset. Associated symptoms include back pain. She has tried laxatives for the symptoms. The treatment provided moderate relief.  Osteoporosis PT taking Fosamax weekly. Last Dexascan was 04/22/16.    Review of Systems  Constitutional:  Positive for irritability and malaise/fatigue.  Respiratory:  Negative for shortness of breath.   Gastrointestinal:  Positive for constipation.  Musculoskeletal:  Positive for back pain.  Psychiatric/Behavioral:  The patient is nervous/anxious.   All other systems reviewed and are negative.     Objective:   Physical Exam Vitals reviewed.  Constitutional:      General: She is not in acute distress.    Appearance: She is well-developed.  HENT:     Head: Normocephalic and atraumatic.     Right Ear: Tympanic membrane normal.     Left Ear: Tympanic membrane normal.  Eyes:     Pupils: Pupils are equal, round, and reactive to light.  Neck:     Thyroid: No thyromegaly.  Cardiovascular:     Rate and Rhythm: Normal rate and regular rhythm.     Heart sounds: Normal heart sounds. No murmur heard. Pulmonary:     Effort: Pulmonary effort is normal. No respiratory distress.     Breath sounds: Normal breath sounds. No wheezing.  Abdominal:     General: Bowel sounds are normal. There is no distension.     Palpations: Abdomen is soft.     Tenderness: There is  no abdominal tenderness.  Musculoskeletal:        General: No tenderness. Normal range of motion.     Cervical back: Normal range of motion and neck supple.  Skin:    General: Skin is warm and dry.  Neurological:     Mental Status: She is alert and oriented to person, place, and time.     Cranial Nerves: No cranial nerve  deficit.     Deep Tendon Reflexes: Reflexes are normal and symmetric.  Psychiatric:        Behavior: Behavior normal.        Thought Content: Thought content normal.        Judgment: Judgment normal.       BP 126/83   Pulse 82   Temp (!) 97.5 F (36.4 C) (Temporal)   Ht '5\' 6"'  (1.676 m)   Wt 128 lb 9.6 oz (58.3 kg)   BMI 20.76 kg/m   Assessment & Plan:  [name] comes in today with chief complaint of Medical Management of Chronic Issues   Diagnosis and orders addressed:  1. Other osteoporosis, unspecified pathological fracture presence - alendronate (FOSAMAX) 70 MG tablet; Take with a full glass of water on an empty stomach.  Dispense: 12 tablet; Refill: 1 - CMP14+EGFR  2. Essential hypertension, benign - CMP14+EGFR  3. Current smoker - CMP14+EGFR  4. GAD (generalized anxiety disorder) - CMP14+EGFR  5. Mixed hyperlipidemia - CMP14+EGFR  6. Iron deficiency anemia, unspecified iron deficiency anemia type - CMP14+EGFR - Iron  7. Constipation, unspecified constipation type - CMP14+EGFR  8. Chronic bilateral low back pain without sciatica - CMP14+EGFR   Labs pending Health Maintenance reviewed Diet and exercise encouraged  Follow up plan: 6 months    Evelina Dun, FNP

## 2021-03-23 LAB — CMP14+EGFR
ALT: 14 IU/L (ref 0–32)
AST: 19 IU/L (ref 0–40)
Albumin/Globulin Ratio: 2 (ref 1.2–2.2)
Albumin: 4.4 g/dL (ref 3.8–4.9)
Alkaline Phosphatase: 82 IU/L (ref 44–121)
BUN/Creatinine Ratio: 9 (ref 9–23)
BUN: 8 mg/dL (ref 6–24)
Bilirubin Total: <0.2 mg/dL (ref 0.0–1.2)
CO2: 25 mmol/L (ref 20–29)
Calcium: 9.4 mg/dL (ref 8.7–10.2)
Chloride: 100 mmol/L (ref 96–106)
Creatinine, Ser: 0.86 mg/dL (ref 0.57–1.00)
Globulin, Total: 2.2 g/dL (ref 1.5–4.5)
Glucose: 97 mg/dL (ref 70–99)
Potassium: 3.9 mmol/L (ref 3.5–5.2)
Sodium: 139 mmol/L (ref 134–144)
Total Protein: 6.6 g/dL (ref 6.0–8.5)
eGFR: 80 mL/min/{1.73_m2} (ref >59)

## 2021-03-23 LAB — IRON: Iron: 72 ug/dL (ref 27–159)

## 2021-03-25 ENCOUNTER — Other Ambulatory Visit: Payer: Self-pay | Admitting: Family

## 2021-03-25 DIAGNOSIS — F411 Generalized anxiety disorder: Secondary | ICD-10-CM

## 2021-04-10 ENCOUNTER — Ambulatory Visit: Payer: Medicaid Other | Admitting: Family

## 2021-06-14 ENCOUNTER — Other Ambulatory Visit: Payer: Self-pay | Admitting: Family

## 2021-06-14 DIAGNOSIS — F411 Generalized anxiety disorder: Secondary | ICD-10-CM

## 2021-07-04 ENCOUNTER — Telehealth: Payer: Self-pay | Admitting: Family

## 2021-07-04 NOTE — Telephone Encounter (Signed)
Pt called in needing help calling [facility] to get these medications refilled. Call to [facility] & they will get them ready for her.

## 2021-07-16 ENCOUNTER — Other Ambulatory Visit: Payer: Self-pay | Admitting: Family

## 2021-07-16 DIAGNOSIS — E782 Mixed hyperlipidemia: Secondary | ICD-10-CM

## 2021-09-18 ENCOUNTER — Other Ambulatory Visit: Payer: Self-pay | Admitting: Family

## 2021-09-18 DIAGNOSIS — F411 Generalized anxiety disorder: Secondary | ICD-10-CM

## 2021-09-19 ENCOUNTER — Ambulatory Visit: Payer: Medicaid Other | Admitting: Family

## 2021-09-19 ENCOUNTER — Encounter: Payer: Self-pay | Admitting: Family

## 2021-09-19 VITALS — BP 133/83 | HR 98 | Temp 97.0°F | Ht 66.0 in | Wt 120.4 lb

## 2021-09-19 DIAGNOSIS — E782 Mixed hyperlipidemia: Secondary | ICD-10-CM | POA: Diagnosis not present

## 2021-09-19 DIAGNOSIS — K5900 Constipation, unspecified: Secondary | ICD-10-CM

## 2021-09-19 DIAGNOSIS — F411 Generalized anxiety disorder: Secondary | ICD-10-CM | POA: Diagnosis not present

## 2021-09-19 DIAGNOSIS — D509 Iron deficiency anemia, unspecified: Secondary | ICD-10-CM

## 2021-09-19 DIAGNOSIS — M818 Other osteoporosis without current pathological fracture: Secondary | ICD-10-CM | POA: Diagnosis not present

## 2021-09-19 DIAGNOSIS — I10 Essential (primary) hypertension: Secondary | ICD-10-CM

## 2021-09-19 DIAGNOSIS — M25512 Pain in left shoulder: Secondary | ICD-10-CM | POA: Diagnosis not present

## 2021-09-19 DIAGNOSIS — F17200 Nicotine dependence, unspecified, uncomplicated: Secondary | ICD-10-CM

## 2021-09-19 DIAGNOSIS — M25511 Pain in right shoulder: Secondary | ICD-10-CM | POA: Diagnosis not present

## 2021-09-19 DIAGNOSIS — Z23 Encounter for immunization: Secondary | ICD-10-CM

## 2021-09-19 DIAGNOSIS — M545 Low back pain, unspecified: Secondary | ICD-10-CM

## 2021-09-19 DIAGNOSIS — G8929 Other chronic pain: Secondary | ICD-10-CM

## 2021-09-19 MED ORDER — SIMVASTATIN 20 MG PO TABS: ORAL_TABLET | ORAL | 3 refills | Status: DC

## 2021-09-19 MED ORDER — ALENDRONATE SODIUM 70 MG PO TABS: ORAL_TABLET | ORAL | 1 refills | Status: DC

## 2021-09-19 MED ORDER — DULOXETINE HCL 60 MG PO CPEP: 60.0000 mg | ORAL_CAPSULE | Freq: Every day | ORAL | 3 refills | Status: DC

## 2021-09-19 MED ORDER — METHYLPREDNISOLONE ACETATE 80 MG/ML IJ SUSP
80.0000 mg | Freq: Once | INTRAMUSCULAR | Status: AC
  Administered 2021-09-19: 80 mg via INTRAMUSCULAR

## 2021-09-19 MED ORDER — LIDOCAINE HCL (PF) 1 % IJ SOLN
1.0000 mL | Freq: Once | INTRAMUSCULAR | Status: AC
  Administered 2021-09-19: 1 mL via INTRADERMAL

## 2021-09-19 MED ORDER — HYDROXYZINE PAMOATE 25 MG PO CAPS: ORAL_CAPSULE | ORAL | 4 refills | Status: DC

## 2021-09-19 MED ORDER — BUPROPION HCL ER (XL) 150 MG PO TB24: 150.0000 mg | ORAL_TABLET | Freq: Every day | ORAL | 3 refills | Status: DC

## 2021-09-19 NOTE — Patient Instructions (Signed)
Managing the Challenge of Quitting Smoking ?Quitting smoking is a physical and mental challenge. You will face cravings, withdrawal symptoms, and temptation. Before quitting, work with your health care provider to make a plan that can help you manage quitting. Preparation can help you quit and keep you from giving in. ?How to manage lifestyle changes ?Managing stress ?Stress can make you want to smoke, and wanting to smoke may cause stress. It is important to find ways to manage your stress. You might try some of the following: ?Practice relaxation techniques. ?Breathe slowly and deeply, in through your nose and out through your mouth. ?Listen to music. ?Soak in a bath or take a shower. ?Imagine a peaceful place or vacation. ?Get some support. ?Talk with family or friends about your stress. ?Join a support group. ?Talk with a counselor or therapist. ?Get some physical activity. ?Go for a walk, run, or bike ride. ?Play a favorite sport. ?Practice yoga. ? ?Medicines ?Talk with your health care provider about medicines that might help you deal with cravings and make quitting easier for you. ?Relationships ?Social situations can be difficult when you are quitting smoking. To manage this, you can: ?Avoid parties and other social situations where people might be smoking. ?Avoid alcohol. ?Leave right away if you have the urge to smoke. ?Explain to your family and friends that you are quitting smoking. Ask for support and let them know you might be a bit grumpy. ?Plan activities where smoking is not an option. ?General instructions ?Be aware that many people gain weight after they quit smoking. However, not everyone does. To keep from gaining weight, have a plan in place before you quit and stick to the plan after you quit. Your plan should include: ?Having healthy snacks. When you have a craving, it may help to: ?Eat popcorn, carrots, celery, or other cut vegetables. ?Chew sugar-free gum. ?Changing how you eat. ?Eat small  portion sizes at meals. ?Eat 4-6 small meals throughout the day instead of 1-2 large meals a day. ?Be mindful when you eat. Do not watch television or do other things that might distract you as you eat. ?Exercising regularly. ?Make time to exercise each day. If you do not have time for a long workout, do short bouts of exercise for 5-10 minutes several times a day. ?Do some form of strengthening exercise, such as weight lifting. ?Do some exercise that gets your heart beating and causes you to breathe deeply, such as walking fast, running, swimming, or biking. This is very important. ?Drinking plenty of water or other low-calorie or no-calorie drinks. Drink 6-8 glasses of water daily. ? ?How to recognize withdrawal symptoms ?Your body and mind may experience discomfort as you try to get used to not having nicotine in your system. These effects are called withdrawal symptoms. They may include: ?Feeling hungrier than normal. ?Having trouble concentrating. ?Feeling irritable or restless. ?Having trouble sleeping. ?Feeling depressed. ?Craving a cigarette. ?To manage withdrawal symptoms: ?Avoid places, people, and activities that trigger your cravings. ?Remember why you want to quit. ?Get plenty of sleep. ?Avoid coffee and other caffeinated drinks. These may worsen some of your symptoms. ?These symptoms may surprise you. But be assured that they are normal to have when quitting smoking. ?How to manage cravings ?Come up with a plan for how to deal with your cravings. The plan should include the following: ?A definition of the specific situation you want to deal with. ?An alternative action you will take. ?A clear idea for how this action   will help. ?The name of someone who might help you with this. ?Cravings usually last for 5-10 minutes. Consider taking the following actions to help you with your plan to deal with cravings: ?Keep your mouth busy. ?Chew sugar-free gum. ?Suck on hard candies or a straw. ?Brush your  teeth. ?Keep your hands and body busy. ?Change to a different activity right away. ?Squeeze or play with a ball. ?Do an activity or a hobby, such as making bead jewelry, practicing needlepoint, or working with wood. ?Mix up your normal routine. ?Take a short exercise break. Go for a quick walk or run up and down stairs. ?Focus on doing something kind or helpful for someone else. ?Call a friend or family member to talk during a craving. ?Join a support group. ?Contact a quitline. ?Where to find support ?To get help or find a support group: ?Call the National Cancer Institute's Smoking Quitline: 1-800-QUIT NOW (784-8669) ?Visit the website of the Substance Abuse and Mental Health Services Administration: www.samhsa.gov ?Text QUIT to SmokefreeTXT: 478848 ?Where to find more information ?Visit these websites to find more information on quitting smoking: ?National Cancer Institute: www.smokefree.gov ?American Lung Association: www.lung.org ?American Cancer Society: www.cancer.org ?Centers for Disease Control and Prevention: www.cdc.gov ?American Heart Association: www.heart.org ?Contact a health care provider if: ?You want to change your plan for quitting. ?The medicines you are taking are not helping. ?Your eating feels out of control or you cannot sleep. ?Get help right away if: ?You feel depressed or become very anxious. ?Summary ?Quitting smoking is a physical and mental challenge. You will face cravings, withdrawal symptoms, and temptation to smoke again. Preparation can help you as you go through these challenges. ?Try different techniques to manage stress, handle social situations, and prevent weight gain. ?You can deal with cravings by keeping your mouth busy (such as by chewing gum), keeping your hands and body busy, calling family or friends, or contacting a quitline for people who want to quit smoking. ?You can deal with withdrawal symptoms by avoiding places where people smoke, getting plenty of rest, and  avoiding drinks with caffeine. ?This information is not intended to replace advice given to you by your health care provider. Make sure you discuss any questions you have with your health care provider. ?Document Revised: 02/15/2021 Document Reviewed: 03/29/2019 ?Elsevier Patient Education ? 2022 [employer] ? ?

## 2021-09-19 NOTE — Progress Notes (Signed)
? ?Subjective:  ? ? Patient ID: [name], female    DOB: Dec 19, 1965, 56 y.o.   MRN: [id number] ? ?Chief Complaint  ?Patient presents with  ? Medical Management of Chronic Issues  ?  Wants shots in shoulder.  ? ?PT presents to the office today for chronic follow up. Pt states her mother and father passed away 09/20/2018. She states she is anxious, but doing well. She currently lives alone.  ? ?She has osteoporosis and her last dexa scan was 04/22/16.  She is taking Fosamax 70 mg weekly.  ?Hypertension ?This is a chronic problem. The current episode started more than 1 year ago. The problem has been resolved since onset. The problem is controlled. Associated symptoms include anxiety and malaise/fatigue. Pertinent negatives include no peripheral edema or shortness of breath. Risk factors for coronary artery disease include dyslipidemia, sedentary lifestyle and smoking/tobacco exposure. The current treatment provides moderate improvement.  ?Hyperlipidemia ?This is a chronic problem. The current episode started more than 1 year ago. The problem is controlled. Recent lipid tests were reviewed and are normal. Pertinent negatives include no shortness of breath. Current antihyperlipidemic treatment includes statins. The current treatment provides moderate improvement of lipids. Risk factors for coronary artery disease include dyslipidemia, hypertension, a sedentary lifestyle and post-menopausal.  ?Anxiety ?Presents for follow-up visit. Symptoms include depressed mood, excessive worry, muscle tension, nervous/anxious behavior and restlessness. Patient reports no shortness of breath. Symptoms occur occasionally.  ? ?Her past medical history is significant for anemia.  ?Back Pain ?This is a chronic problem. The current episode started more than 1 year ago. The problem occurs intermittently. The pain is present in the lumbar spine. The quality of the pain is described as aching. The pain is at a severity of 2/10. The pain is  moderate. She has tried bed rest and NSAIDs for the symptoms. The treatment provided mild relief.  ?Nicotine Dependence ?Presents for follow-up visit. Her urge triggers include company of smokers. The symptoms have been improving. She smokes < 1/2 a pack of cigarettes per day.  ?Constipation ?This is a chronic problem. The current episode started more than 1 year ago. The problem has been resolved since onset. Her stool frequency is 1 time per day. Associated symptoms include back pain. She has tried stool softeners for the symptoms. The treatment provided moderate relief.  ?Anemia ?Presents for follow-up visit. Symptoms include malaise/fatigue. There has been no bruising/bleeding easily or leg swelling.  ?Shoulder Pain  ?The pain is present in the left shoulder and right shoulder. This is a chronic problem. The current episode started more than 1 month ago. There has been no history of extremity trauma. The problem occurs intermittently. The problem has been waxing and waning. The quality of the pain is described as aching. The pain is moderate. She has tried rest for the symptoms. The treatment provided mild relief.  ? ? ? ?Review of Systems  ?Constitutional:  Positive for malaise/fatigue.  ?Respiratory:  Negative for shortness of breath.   ?Gastrointestinal:  Positive for constipation.  ?Musculoskeletal:  Positive for back pain.  ?Hematological:  Does not bruise/bleed easily.  ?Psychiatric/Behavioral:  The patient is nervous/anxious.   ?All other systems reviewed and are negative. ? ?   ?Objective:  ? Physical Exam ?Vitals reviewed.  ?Constitutional:   ?   General: She is not in acute distress. ?   Appearance: She is well-developed.  ?HENT:  ?   Head: Normocephalic and atraumatic.  ?   Right Ear: Tympanic membrane  normal.  ?   Left Ear: Tympanic membrane normal.  ?Eyes:  ?   Pupils: Pupils are equal, round, and reactive to light.  ?Neck:  ?   Thyroid: No thyromegaly.  ?Cardiovascular:  ?   Rate and Rhythm: Normal  rate and regular rhythm.  ?   Heart sounds: Normal heart sounds. No murmur heard. ?Pulmonary:  ?   Effort: Pulmonary effort is normal. No respiratory distress.  ?   Breath sounds: Normal breath sounds. No wheezing.  ?Abdominal:  ?   General: Bowel sounds are normal. There is no distension.  ?   Palpations: Abdomen is soft.  ?   Tenderness: There is no abdominal tenderness.  ?Musculoskeletal:     ?   General: Tenderness present. Normal range of motion.  ?   Cervical back: Normal range of motion and neck supple.  ?   Comments: Mild pain with abduction of bilateral shoulders   ?Skin: ?   General: Skin is warm and dry.  ?Neurological:  ?   Mental Status: She is alert and oriented to person, place, and time.  ?   Cranial Nerves: No cranial nerve deficit.  ?   Deep Tendon Reflexes: Reflexes are normal and symmetric.  ?Psychiatric:     ?   Behavior: Behavior normal.     ?   Thought Content: Thought content normal.     ?   Judgment: Judgment normal.  ? ? ? ?rightshoulder prepped with betadine ?Injected with Marcaine .5% plain and methylprednisolone with 22 guage needle x 1. Patient tolerated well. ? ?Left shoulder prepped with betadine ?Injected with Marcaine .5% plain and methylprednisolone with 22 guage needle x 1. Patient tolerated well. ? ?  BP 133/83   Pulse 98   Temp (!) 97 ?F (36.1 ?C) (Temporal)   Ht '5\' 6"'  (1.676 m)   Wt 120 lb 6.4 oz (54.6 kg)   BMI 19.43 kg/m?  ? ?Assessment & Plan:  ?[name] comes in today with chief complaint of Medical Management of Chronic Issues (Wants shots in shoulder.) ? ? ?Diagnosis and orders addressed: ? ?1. GAD (generalized anxiety disorder) ?- hydrOXYzine (VISTARIL) 25 MG capsule; TAKE 1 CAPSULE BY MOUTH THREE TIMES A DAY AS NEEDED  Dispense: 90 capsule; Refill: 4 ?- buPROPion (WELLBUTRIN XL) 150 MG 24 hr tablet; Take 1 tablet (150 mg total) by mouth daily.  Dispense: 90 tablet; Refill: 3 ?- DULoxetine (CYMBALTA) 60 MG capsule; Take 1 capsule (60 mg total) by mouth daily.   Dispense: 90 capsule; Refill: 3 ?- CMP14+EGFR ?- CBC with Differential/Platelet ? ?2. Mixed hyperlipidemia ?- simvastatin (ZOCOR) 20 MG tablet; TAKE 1 TABLET BY MOUTH DAILY AT 6 PM.  Dispense: 90 tablet; Refill: 3 ?- CMP14+EGFR ?- CBC with Differential/Platelet ? ?3. Other osteoporosis, unspecified pathological fracture presence ?- alendronate (FOSAMAX) 70 MG tablet; Take with a full glass of water on an empty stomach.  Dispense: 12 tablet; Refill: 1 ?- DG [facility] DEXA ?- CMP14+EGFR ?- CBC with Differential/Platelet ? ?4. Essential hypertension, benign ?- CMP14+EGFR ?- CBC with Differential/Platelet ? ?5. Iron deficiency anemia, unspecified iron deficiency anemia type ?- Anemia Profile B ?- CMP14+EGFR ?- CBC with Differential/Platelet ? ?6. Current smoker ?- CMP14+EGFR ?- CBC with Differential/Platelet ? ?7. Constipation, unspecified constipation type ?- CMP14+EGFR ?- CBC with Differential/Platelet ? ?8. Chronic bilateral low back pain without sciatica ?- CMP14+EGFR ?- CBC with Differential/Platelet ? ?9. Acute pain of both shoulders ?- CMP14+EGFR ?- CBC with Differential/Platelet ? ? ?Labs pending ?Health  Maintenance reviewed ?Diet and exercise encouraged ? ?Follow up plan: ?3 months  ? ? ?Evelina Dun, FNP ? ? ? ?

## 2021-09-20 LAB — ANEMIA PROFILE B
Basophils Absolute: 0 10*3/uL (ref 0.0–0.2)
Basos: 1 %
EOS (ABSOLUTE): 0.1 10*3/uL (ref 0.0–0.4)
Eos: 2 %
Ferritin: 407 ng/mL — ABNORMAL HIGH (ref 15–150)
Folate: 5.1 ng/mL (ref >3.0)
Hematocrit: 39.5 % (ref 34.0–46.6)
Hemoglobin: 13.2 g/dL (ref 11.1–15.9)
Immature Grans (Abs): 0 10*3/uL (ref 0.0–0.1)
Immature Granulocytes: 0 %
Iron Saturation: 26 % (ref 15–55)
Iron: 72 ug/dL (ref 27–159)
Lymphocytes Absolute: 2.1 10*3/uL (ref 0.7–3.1)
Lymphs: 36 %
MCH: 31.1 pg (ref 26.6–33.0)
MCHC: 33.4 g/dL (ref 31.5–35.7)
MCV: 93 fL (ref 79–97)
Monocytes Absolute: 0.9 10*3/uL (ref 0.1–0.9)
Monocytes: 15 %
Neutrophils Absolute: 2.7 10*3/uL (ref 1.4–7.0)
Neutrophils: 46 %
Platelets: 308 10*3/uL (ref 150–450)
RBC: 4.24 x10E6/uL (ref 3.77–5.28)
RDW: 12.3 % (ref 11.7–15.4)
Retic Ct Pct: 1.4 % (ref 0.6–2.6)
Total Iron Binding Capacity: 272 ug/dL (ref 250–450)
UIBC: 200 ug/dL (ref 131–425)
Vitamin B-12: 344 pg/mL (ref 232–1245)
WBC: 5.9 10*3/uL (ref 3.4–10.8)

## 2021-09-20 LAB — CMP14+EGFR
ALT: 15 IU/L (ref 0–32)
AST: 19 IU/L (ref 0–40)
Albumin/Globulin Ratio: 1.8 (ref 1.2–2.2)
Albumin: 4.2 g/dL (ref 3.8–4.9)
Alkaline Phosphatase: 70 IU/L (ref 44–121)
BUN/Creatinine Ratio: 12 (ref 9–23)
BUN: 9 mg/dL (ref 6–24)
Bilirubin Total: <0.2 mg/dL (ref 0.0–1.2)
CO2: 25 mmol/L (ref 20–29)
Calcium: 9.7 mg/dL (ref 8.7–10.2)
Chloride: 98 mmol/L (ref 96–106)
Creatinine, Ser: 0.76 mg/dL (ref 0.57–1.00)
Globulin, Total: 2.3 g/dL (ref 1.5–4.5)
Glucose: 80 mg/dL (ref 70–99)
Potassium: 4.2 mmol/L (ref 3.5–5.2)
Sodium: 138 mmol/L (ref 134–144)
Total Protein: 6.5 g/dL (ref 6.0–8.5)
eGFR: 92 mL/min/{1.73_m2} (ref >59)

## 2021-09-25 ENCOUNTER — Telehealth: Payer: Self-pay | Admitting: Family

## 2021-09-25 ENCOUNTER — Other Ambulatory Visit: Payer: Self-pay | Admitting: Family

## 2021-09-25 DIAGNOSIS — G8929 Other chronic pain: Secondary | ICD-10-CM

## 2021-09-25 NOTE — Telephone Encounter (Signed)
?  Prescription Request ? ?09/25/2021 ? ?Is this a "Controlled Substance" medicine? no ? ?Have you seen your PCP in the last 2 weeks? no ? ?If YES, route message to pool  -  If NO, patient needs to be scheduled for appointment. ? ?What is the name of the medication or equipment? Nicorette gum ? ?Have you contacted your pharmacy to request a refill? NO  ? ?Which pharmacy would you like this sent to? [facility] [place] ? ? ?Patient notified that their request is being sent to the clinical staff for review and that they should receive a response within 2 business days.  ?  ?

## 2021-09-26 MED ORDER — NICOTINE POLACRILEX 4 MG MT GUM: 4.0000 mg | CHEWING_GUM | OROMUCOSAL | 0 refills | Status: DC | PRN

## 2021-09-26 NOTE — Telephone Encounter (Signed)
Nicotine 4 mg Prescription sent to pharmacy  ? ?

## 2021-09-26 NOTE — Telephone Encounter (Addendum)
Attempted to contact patient. Unable to leave message. AP  ?

## 2021-10-01 ENCOUNTER — Telehealth: Payer: Self-pay | Admitting: Family

## 2021-10-01 ENCOUNTER — Ambulatory Visit (INDEPENDENT_AMBULATORY_CARE_PROVIDER_SITE_OTHER): Payer: Medicaid Other

## 2021-10-01 DIAGNOSIS — M810 Age-related osteoporosis without current pathological fracture: Secondary | ICD-10-CM

## 2021-10-01 MED ORDER — IBUPROFEN 600 MG PO TABS: 600.0000 mg | ORAL_TABLET | Freq: Three times a day (TID) | ORAL | 1 refills | Status: DC | PRN

## 2021-10-01 MED ORDER — FLUTICASONE PROPIONATE 50 MCG/ACT NA SUSP: NASAL | 4 refills | Status: DC

## 2021-10-01 NOTE — Telephone Encounter (Signed)
Pt aware refills sent to pharmacy 

## 2021-10-01 NOTE — Telephone Encounter (Signed)
?  Prescription Request ? ?10/01/2021 ? ?Is this a "Controlled Substance" medicine? No    ? ?Have you seen your PCP in the last 2 weeks? Yes  09/19/21 ? ?If YES, route message to pool  -  If NO, patient needs to be scheduled for appointment. ? ?What is the name of the medication or equipment? Fluticasone 50 mg and Ibuprofen 600 mg   ? ?Have you contacted your pharmacy to request a refill? yes  ? ?Which pharmacy would you like this sent to? [facility] [place] ? ? ?Patient notified that their request is being sent to the clinical staff for review and that they should receive a response within 2 business days.  ?  ?

## 2021-10-02 DIAGNOSIS — M810 Age-related osteoporosis without current pathological fracture: Secondary | ICD-10-CM | POA: Diagnosis not present

## 2021-10-02 DIAGNOSIS — Z780 Asymptomatic menopausal state: Secondary | ICD-10-CM | POA: Diagnosis not present

## 2021-11-08 ENCOUNTER — Other Ambulatory Visit: Payer: Self-pay | Admitting: Family

## 2022-01-12 ENCOUNTER — Other Ambulatory Visit: Payer: Self-pay | Admitting: Family

## 2022-01-16 ENCOUNTER — Other Ambulatory Visit: Payer: Self-pay | Admitting: Family

## 2022-01-16 DIAGNOSIS — F411 Generalized anxiety disorder: Secondary | ICD-10-CM

## 2022-02-21 ENCOUNTER — Other Ambulatory Visit: Payer: Self-pay | Admitting: Family

## 2022-02-21 DIAGNOSIS — G8929 Other chronic pain: Secondary | ICD-10-CM

## 2022-02-25 ENCOUNTER — Other Ambulatory Visit: Payer: Self-pay | Admitting: Family

## 2022-02-25 DIAGNOSIS — Z1231 Encounter for screening mammogram for malignant neoplasm of breast: Secondary | ICD-10-CM

## 2022-03-12 ENCOUNTER — Ambulatory Visit
Admission: RE | Admit: 2022-03-12 | Discharge: 2022-03-12 | Disposition: A | Discharge status: Home / Self Care | Payer: Medicaid Other | Source: Ambulatory Visit | Attending: Family | Admitting: Family

## 2022-03-12 DIAGNOSIS — Z1231 Encounter for screening mammogram for malignant neoplasm of breast: Secondary | ICD-10-CM

## 2022-03-25 ENCOUNTER — Ambulatory Visit: Payer: Medicaid Other | Admitting: Family

## 2022-03-25 ENCOUNTER — Encounter: Payer: Self-pay | Admitting: Family

## 2022-03-25 VITALS — BP 124/83 | HR 92 | Temp 98.2°F | Ht 66.0 in | Wt 114.2 lb

## 2022-03-25 DIAGNOSIS — M545 Low back pain, unspecified: Secondary | ICD-10-CM | POA: Diagnosis not present

## 2022-03-25 DIAGNOSIS — Z0000 Encounter for general adult medical examination without abnormal findings: Secondary | ICD-10-CM

## 2022-03-25 DIAGNOSIS — Z23 Encounter for immunization: Secondary | ICD-10-CM | POA: Diagnosis not present

## 2022-03-25 DIAGNOSIS — I10 Essential (primary) hypertension: Secondary | ICD-10-CM | POA: Diagnosis not present

## 2022-03-25 DIAGNOSIS — M818 Other osteoporosis without current pathological fracture: Secondary | ICD-10-CM | POA: Diagnosis not present

## 2022-03-25 DIAGNOSIS — Z0001 Encounter for general adult medical examination with abnormal findings: Secondary | ICD-10-CM | POA: Diagnosis not present

## 2022-03-25 DIAGNOSIS — D509 Iron deficiency anemia, unspecified: Secondary | ICD-10-CM

## 2022-03-25 DIAGNOSIS — G8929 Other chronic pain: Secondary | ICD-10-CM

## 2022-03-25 DIAGNOSIS — K5900 Constipation, unspecified: Secondary | ICD-10-CM

## 2022-03-25 DIAGNOSIS — E782 Mixed hyperlipidemia: Secondary | ICD-10-CM

## 2022-03-25 DIAGNOSIS — F411 Generalized anxiety disorder: Secondary | ICD-10-CM

## 2022-03-25 DIAGNOSIS — F17200 Nicotine dependence, unspecified, uncomplicated: Secondary | ICD-10-CM

## 2022-03-25 NOTE — Patient Instructions (Signed)

## 2022-03-25 NOTE — Progress Notes (Signed)
Subjective:    Patient ID: [name], female    DOB: 05-Feb-1966, 56 y.o.   MRN: [id number]  Chief Complaint  Patient presents with   Medical Management of Chronic Issues   Anxiety   Fibromyalgia   Cerumen Impaction   PT presents to the office today for CPE and  chronic follow up. Pt states her mother and father passed away 09-07-2018. She states she is anxious, but doing well. She currently living with her daughter.    She has osteoporosis and her last dexa scan was 10/01/21.Marland Kitchen  She is taking Fosamax 70 mg weekly.  Anxiety Presents for follow-up visit. Symptoms include depressed mood, excessive worry, irritability, nervous/anxious behavior and restlessness. Patient reports no shortness of breath. Symptoms occur occasionally. The severity of symptoms is moderate.   Her past medical history is significant for anemia.  Hypertension This is a chronic problem. The current episode started more than 1 year ago. The problem has been resolved since onset. The problem is controlled. Associated symptoms include anxiety and malaise/fatigue. Pertinent negatives include no peripheral edema or shortness of breath. Risk factors for coronary artery disease include smoking/tobacco exposure. The current treatment provides moderate improvement.  Hyperlipidemia This is a chronic problem. The current episode started more than 1 year ago. The problem is controlled. Recent lipid tests were reviewed and are normal. Pertinent negatives include no shortness of breath. Current antihyperlipidemic treatment includes statins. The current treatment provides moderate improvement of lipids. Risk factors for coronary artery disease include dyslipidemia, hypertension and post-menopausal.  Nicotine Dependence Presents for follow-up visit. Symptoms include irritability. Her urge triggers include company of smokers. The symptoms have been stable. She smokes < 1/2 a pack of cigarettes per day.  Constipation This is a chronic  problem. The current episode started more than 1 year ago. The problem has been resolved since onset. Her stool frequency is 1 time per day. Associated symptoms include back pain. She has tried diet changes for the symptoms. The treatment provided moderate relief.  Back Pain This is a chronic problem. The current episode started more than 1 year ago. The problem occurs intermittently. The problem has been waxing and waning since onset. The pain is present in the lumbar spine. The quality of the pain is described as aching. The pain is at a severity of 7/10. The pain is mild. The symptoms are aggravated by twisting and bending. She has tried muscle relaxant for the symptoms. The treatment provided moderate relief.  Anemia Presents for follow-up visit. Symptoms include malaise/fatigue. There has been no bruising/bleeding easily.      Review of Systems  Constitutional:  Positive for irritability and malaise/fatigue.  Respiratory:  Negative for shortness of breath.   Gastrointestinal:  Positive for constipation.  Musculoskeletal:  Positive for back pain.  Hematological:  Does not bruise/bleed easily.  Psychiatric/Behavioral:  The patient is nervous/anxious.   All other systems reviewed and are negative.  Family History  Problem Relation Age of Onset   COPD Mother    Osteoporosis Mother    COPD Father    Osteoporosis Father    Colon cancer Neg Hx    Breast cancer Neg Hx    Social History   Socioeconomic History   Marital status: Divorced    Spouse name: Not on file   Number of children: Not on file   Years of education: Not on file   Highest education level: Not on file  Occupational History   Not on file  Tobacco  Use   Smoking status: Every Day    Packs/day: 1.00    Years: 29.00    Total pack years: 29.00    Types: Cigarettes   Smokeless tobacco: Never  Vaping Use   Vaping Use: Never used  Substance and Sexual Activity   Alcohol use: No    Alcohol/week: 0.0 standard drinks  of alcohol   Drug use: No   Sexual activity: Not on file  Other Topics Concern   Not on file  Social History Narrative   Not on file   Social Determinants of Health   Financial Resource Strain: Not on file  Food Insecurity: Not on file  Transportation Needs: Not on file  Physical Activity: Not on file  Stress: Not on file  Social Connections: Not on file       Objective:   Physical Exam Vitals reviewed.  Constitutional:      General: She is not in acute distress.    Appearance: She is well-developed.  HENT:     Head: Normocephalic and atraumatic.     Right Ear: Tympanic membrane normal.     Left Ear: Tympanic membrane normal.  Eyes:     Pupils: Pupils are equal, round, and reactive to light.  Neck:     Thyroid: No thyromegaly.  Cardiovascular:     Rate and Rhythm: Normal rate and regular rhythm.     Heart sounds: Normal heart sounds. No murmur heard. Pulmonary:     Effort: Pulmonary effort is normal. No respiratory distress.     Breath sounds: Normal breath sounds. No wheezing.  Abdominal:     General: Bowel sounds are normal. There is no distension.     Palpations: Abdomen is soft.     Tenderness: There is no abdominal tenderness.  Musculoskeletal:        General: No tenderness. Normal range of motion.     Cervical back: Normal range of motion and neck supple.  Skin:    General: Skin is warm and dry.  Neurological:     Mental Status: She is alert and oriented to person, place, and time.     Cranial Nerves: No cranial nerve deficit.     Deep Tendon Reflexes: Reflexes are normal and symmetric.  Psychiatric:        Behavior: Behavior normal.        Thought Content: Thought content normal.        Judgment: Judgment normal.       BP 124/83   Pulse 92   Temp 98.2 F (36.8 C) (Temporal)   Ht _0  (1.676 m)   Wt 114 lb 3.2 oz (51.8 kg)   SpO2 100%   BMI 18.43 kg/m      Assessment & Plan:   [name] comes in today with chief complaint of  Medical Management of Chronic Issues, Anxiety, Fibromyalgia, and Cerumen Impaction   Diagnosis and orders addressed:  1. Need for immunization against influenza - Flu Vaccine QUAD 5moIM (Fluarix, Fluzone & Alfiuria Quad PF) - CMP14+EGFR - CBC with Differential/Platelet  2. Essential hypertension, benign - CMP14+EGFR - CBC with Differential/Platelet  3. Other osteoporosis, unspecified pathological fracture presence - CMP14+EGFR - CBC with Differential/Platelet  4. Chronic bilateral low back pain without sciatica - CMP14+EGFR - CBC with Differential/Platelet  5. Current smoker  - CMP14+EGFR - CBC with Differential/Platelet  6. Constipation, unspecified constipation type - CMP14+EGFR - CBC with Differential/Platelet  7. Iron deficiency anemia, unspecified iron deficiency anemia type - CMP14+EGFR -  CBC with Differential/Platelet  8. GAD (generalized anxiety disorder)  - CMP14+EGFR - CBC with Differential/Platelet  9. Mixed hyperlipidemia - CMP14+EGFR - CBC with Differential/Platelet - Lipid panel  10. Annual physical exam - CMP14+EGFR - CBC with Differential/Platelet - Lipid panel - TSH   Labs pending Health Maintenance reviewed Diet and exercise encouraged  Follow up plan: 6 months    Evelina Dun, FNP

## 2022-03-26 LAB — CMP14+EGFR
ALT: 11 IU/L (ref 0–32)
AST: 16 IU/L (ref 0–40)
Albumin/Globulin Ratio: 1.7 (ref 1.2–2.2)
Albumin: 3.8 g/dL (ref 3.8–4.9)
Alkaline Phosphatase: 70 IU/L (ref 44–121)
BUN/Creatinine Ratio: 11 (ref 9–23)
BUN: 8 mg/dL (ref 6–24)
Bilirubin Total: <0.2 mg/dL (ref 0.0–1.2)
CO2: 29 mmol/L (ref 20–29)
Calcium: 8.9 mg/dL (ref 8.7–10.2)
Chloride: 105 mmol/L (ref 96–106)
Creatinine, Ser: 0.73 mg/dL (ref 0.57–1.00)
Globulin, Total: 2.2 g/dL (ref 1.5–4.5)
Glucose: 110 mg/dL — ABNORMAL HIGH (ref 70–99)
Potassium: 3.7 mmol/L (ref 3.5–5.2)
Sodium: 137 mmol/L (ref 134–144)
Total Protein: 6 g/dL (ref 6.0–8.5)
eGFR: 96 mL/min/{1.73_m2} (ref >59)

## 2022-03-26 LAB — CBC WITH DIFFERENTIAL/PLATELET
Basophils Absolute: 0 10*3/uL (ref 0.0–0.2)
Basos: 0 %
EOS (ABSOLUTE): 0.2 10*3/uL (ref 0.0–0.4)
Eos: 3 %
Hematocrit: 36.2 % (ref 34.0–46.6)
Hemoglobin: 12 g/dL (ref 11.1–15.9)
Immature Grans (Abs): 0 10*3/uL (ref 0.0–0.1)
Immature Granulocytes: 1 %
Lymphocytes Absolute: 2.4 10*3/uL (ref 0.7–3.1)
Lymphs: 35 %
MCH: 30.8 pg (ref 26.6–33.0)
MCHC: 33.1 g/dL (ref 31.5–35.7)
MCV: 93 fL (ref 79–97)
Monocytes Absolute: 0.9 10*3/uL (ref 0.1–0.9)
Monocytes: 13 %
Neutrophils Absolute: 3.4 10*3/uL (ref 1.4–7.0)
Neutrophils: 48 %
Platelets: 363 10*3/uL (ref 150–450)
RBC: 3.89 x10E6/uL (ref 3.77–5.28)
RDW: 12.1 % (ref 11.7–15.4)
WBC: 6.9 10*3/uL (ref 3.4–10.8)

## 2022-03-26 LAB — LIPID PANEL
Chol/HDL Ratio: 2.8 ratio (ref 0.0–4.4)
Cholesterol, Total: 148 mg/dL (ref 100–199)
HDL: 53 mg/dL (ref >39)
LDL Chol Calc (NIH): 69 mg/dL (ref 0–99)
Triglycerides: 151 mg/dL — ABNORMAL HIGH (ref 0–149)
VLDL Cholesterol Cal: 26 mg/dL (ref 5–40)

## 2022-03-26 LAB — TSH: TSH: 0.684 u[IU]/mL (ref 0.450–4.500)

## 2022-04-14 ENCOUNTER — Other Ambulatory Visit: Payer: Self-pay | Admitting: Family

## 2022-04-17 ENCOUNTER — Other Ambulatory Visit: Payer: Self-pay | Admitting: Family

## 2022-04-17 DIAGNOSIS — M818 Other osteoporosis without current pathological fracture: Secondary | ICD-10-CM

## 2022-06-22 ENCOUNTER — Inpatient Hospital Stay (HOSPITAL_COMMUNITY)
Admission: EM | Admit: 2022-06-22 | Discharge: 2022-06-25 | DRG: 521 | Disposition: A | Discharge status: Home / Self Care | Payer: Medicaid Other | Attending: Internal Medicine | Admitting: Internal Medicine

## 2022-06-22 ENCOUNTER — Emergency Department (HOSPITAL_COMMUNITY): Payer: Medicaid Other

## 2022-06-22 ENCOUNTER — Other Ambulatory Visit: Payer: Self-pay

## 2022-06-22 ENCOUNTER — Encounter (HOSPITAL_COMMUNITY): Payer: Self-pay | Admitting: Emergency Medicine

## 2022-06-22 ENCOUNTER — Encounter (HOSPITAL_COMMUNITY): Payer: Self-pay

## 2022-06-22 DIAGNOSIS — F32A Depression, unspecified: Secondary | ICD-10-CM | POA: Diagnosis present

## 2022-06-22 DIAGNOSIS — Z79899 Other long term (current) drug therapy: Secondary | ICD-10-CM | POA: Diagnosis not present

## 2022-06-22 DIAGNOSIS — D62 Acute posthemorrhagic anemia: Secondary | ICD-10-CM | POA: Diagnosis not present

## 2022-06-22 DIAGNOSIS — R54 Age-related physical debility: Secondary | ICD-10-CM | POA: Diagnosis present

## 2022-06-22 DIAGNOSIS — I10 Essential (primary) hypertension: Secondary | ICD-10-CM | POA: Diagnosis present

## 2022-06-22 DIAGNOSIS — W010XXA Fall on same level from slipping, tripping and stumbling without subsequent striking against object, initial encounter: Secondary | ICD-10-CM | POA: Diagnosis present

## 2022-06-22 DIAGNOSIS — D649 Anemia, unspecified: Secondary | ICD-10-CM | POA: Diagnosis not present

## 2022-06-22 DIAGNOSIS — Z8262 Family history of osteoporosis: Secondary | ICD-10-CM | POA: Diagnosis not present

## 2022-06-22 DIAGNOSIS — D72829 Elevated white blood cell count, unspecified: Secondary | ICD-10-CM | POA: Diagnosis not present

## 2022-06-22 DIAGNOSIS — S72002A Fracture of unspecified part of neck of left femur, initial encounter for closed fracture: Secondary | ICD-10-CM | POA: Diagnosis not present

## 2022-06-22 DIAGNOSIS — W19XXXA Unspecified fall, initial encounter: Secondary | ICD-10-CM | POA: Diagnosis not present

## 2022-06-22 DIAGNOSIS — Z825 Family history of asthma and other chronic lower respiratory diseases: Secondary | ICD-10-CM

## 2022-06-22 DIAGNOSIS — F419 Anxiety disorder, unspecified: Secondary | ICD-10-CM | POA: Diagnosis present

## 2022-06-22 DIAGNOSIS — F17210 Nicotine dependence, cigarettes, uncomplicated: Secondary | ICD-10-CM | POA: Diagnosis present

## 2022-06-22 DIAGNOSIS — J309 Allergic rhinitis, unspecified: Secondary | ICD-10-CM | POA: Diagnosis present

## 2022-06-22 DIAGNOSIS — S72012A Unspecified intracapsular fracture of left femur, initial encounter for closed fracture: Principal | ICD-10-CM | POA: Diagnosis present

## 2022-06-22 DIAGNOSIS — F418 Other specified anxiety disorders: Secondary | ICD-10-CM

## 2022-06-22 DIAGNOSIS — Z885 Allergy status to narcotic agent status: Secondary | ICD-10-CM

## 2022-06-22 DIAGNOSIS — Z720 Tobacco use: Secondary | ICD-10-CM | POA: Diagnosis not present

## 2022-06-22 DIAGNOSIS — S72042A Displaced fracture of base of neck of left femur, initial encounter for closed fracture: Secondary | ICD-10-CM | POA: Diagnosis not present

## 2022-06-22 DIAGNOSIS — E876 Hypokalemia: Secondary | ICD-10-CM | POA: Diagnosis not present

## 2022-06-22 DIAGNOSIS — F320 Major depressive disorder, single episode, mild: Secondary | ICD-10-CM | POA: Diagnosis present

## 2022-06-22 DIAGNOSIS — J189 Pneumonia, unspecified organism: Secondary | ICD-10-CM | POA: Diagnosis not present

## 2022-06-22 DIAGNOSIS — E785 Hyperlipidemia, unspecified: Secondary | ICD-10-CM | POA: Diagnosis present

## 2022-06-22 DIAGNOSIS — R059 Cough, unspecified: Secondary | ICD-10-CM | POA: Diagnosis not present

## 2022-06-22 DIAGNOSIS — M810 Age-related osteoporosis without current pathological fracture: Secondary | ICD-10-CM | POA: Diagnosis present

## 2022-06-22 DIAGNOSIS — Z9889 Other specified postprocedural states: Secondary | ICD-10-CM | POA: Diagnosis not present

## 2022-06-22 DIAGNOSIS — Z7983 Long term (current) use of bisphosphonates: Secondary | ICD-10-CM

## 2022-06-22 DIAGNOSIS — W1830XA Fall on same level, unspecified, initial encounter: Secondary | ICD-10-CM | POA: Diagnosis not present

## 2022-06-22 DIAGNOSIS — E782 Mixed hyperlipidemia: Secondary | ICD-10-CM | POA: Diagnosis not present

## 2022-06-22 DIAGNOSIS — M25552 Pain in left hip: Secondary | ICD-10-CM | POA: Diagnosis not present

## 2022-06-22 LAB — CBC WITH DIFFERENTIAL/PLATELET
Abs Immature Granulocytes: 0.16 10*3/uL — ABNORMAL HIGH (ref 0.00–0.07)
Basophils Absolute: 0.1 10*3/uL (ref 0.0–0.1)
Basophils Relative: 0 %
Eosinophils Absolute: 0.1 10*3/uL (ref 0.0–0.5)
Eosinophils Relative: 1 %
HCT: 33.3 % — ABNORMAL LOW (ref 36.0–46.0)
Hemoglobin: 11 g/dL — ABNORMAL LOW (ref 12.0–15.0)
Immature Granulocytes: 1 %
Lymphocytes Relative: 11 %
Lymphs Abs: 1.3 10*3/uL (ref 0.7–4.0)
MCH: 30.9 pg (ref 26.0–34.0)
MCHC: 33 g/dL (ref 30.0–36.0)
MCV: 93.5 fL (ref 80.0–100.0)
Monocytes Absolute: 1.6 10*3/uL — ABNORMAL HIGH (ref 0.1–1.0)
Monocytes Relative: 13 %
Neutro Abs: 9.4 10*3/uL — ABNORMAL HIGH (ref 1.7–7.7)
Neutrophils Relative %: 74 %
Platelets: 387 10*3/uL (ref 150–400)
RBC: 3.56 MIL/uL — ABNORMAL LOW (ref 3.87–5.11)
RDW: 12.8 % (ref 11.5–15.5)
WBC: 12.7 10*3/uL — ABNORMAL HIGH (ref 4.0–10.5)
nRBC: 0 % (ref 0.0–0.2)

## 2022-06-22 LAB — BASIC METABOLIC PANEL
Anion gap: 6 (ref 5–15)
BUN: 13 mg/dL (ref 6–20)
CO2: 28 mmol/L (ref 22–32)
Calcium: 8.4 mg/dL — ABNORMAL LOW (ref 8.9–10.3)
Chloride: 103 mmol/L (ref 98–111)
Creatinine, Ser: 0.68 mg/dL (ref 0.44–1.00)
GFR, Estimated: >60 mL/min (ref >60)
Glucose, Bld: 109 mg/dL — ABNORMAL HIGH (ref 70–99)
Potassium: 3.1 mmol/L — ABNORMAL LOW (ref 3.5–5.1)
Sodium: 137 mmol/L (ref 135–145)

## 2022-06-22 LAB — CBC
HCT: 38.1 % (ref 36.0–46.0)
Hemoglobin: 12.5 g/dL (ref 12.0–15.0)
MCH: 30.7 pg (ref 26.0–34.0)
MCHC: 32.8 g/dL (ref 30.0–36.0)
MCV: 93.6 fL (ref 80.0–100.0)
Platelets: 425 10*3/uL — ABNORMAL HIGH (ref 150–400)
RBC: 4.07 MIL/uL (ref 3.87–5.11)
RDW: 12.9 % (ref 11.5–15.5)
WBC: 10.9 10*3/uL — ABNORMAL HIGH (ref 4.0–10.5)
nRBC: 0 % (ref 0.0–0.2)

## 2022-06-22 LAB — VITAMIN D 25 HYDROXY (VIT D DEFICIENCY, FRACTURES): Vit D, 25-Hydroxy: 38.19 ng/mL (ref 30–100)

## 2022-06-22 LAB — CREATININE, SERUM
Creatinine, Ser: 0.57 mg/dL (ref 0.44–1.00)
GFR, Estimated: >60 mL/min (ref >60)

## 2022-06-22 LAB — MAGNESIUM: Magnesium: 1.9 mg/dL (ref 1.7–2.4)

## 2022-06-22 LAB — HIV ANTIBODY (ROUTINE TESTING W REFLEX): HIV Screen 4th Generation wRfx: NONREACTIVE

## 2022-06-22 MED ORDER — SODIUM CHLORIDE 0.9 % IV SOLN: INTRAVENOUS | Status: AC

## 2022-06-22 MED ORDER — MORPHINE SULFATE (PF) 4 MG/ML IV SOLN
4.0000 mg | Freq: Once | INTRAVENOUS | Status: AC
  Administered 2022-06-22: 4 mg via INTRAVENOUS
  Filled 2022-06-22: qty 1

## 2022-06-22 MED ORDER — ONDANSETRON HCL 4 MG/2ML IJ SOLN
4.0000 mg | Freq: Once | INTRAMUSCULAR | Status: AC
  Administered 2022-06-22: 4 mg via INTRAVENOUS
  Filled 2022-06-22: qty 2

## 2022-06-22 MED ORDER — ONDANSETRON HCL 4 MG/2ML IJ SOLN
4.0000 mg | Freq: Four times a day (QID) | INTRAMUSCULAR | Status: DC | PRN
  Administered 2022-06-22 – 2022-06-23 (×2): 4 mg via INTRAVENOUS
  Filled 2022-06-22 (×2): qty 2

## 2022-06-22 MED ORDER — OXYCODONE HCL 5 MG PO TABS
5.0000 mg | ORAL_TABLET | Freq: Four times a day (QID) | ORAL | Status: DC | PRN
  Administered 2022-06-22: 5 mg via ORAL
  Administered 2022-06-22 (×2): 10 mg via ORAL
  Filled 2022-06-22: qty 2
  Filled 2022-06-22: qty 1
  Filled 2022-06-22: qty 2

## 2022-06-22 MED ORDER — POTASSIUM CHLORIDE CRYS ER 20 MEQ PO TBCR
40.0000 meq | EXTENDED_RELEASE_TABLET | ORAL | Status: AC
  Administered 2022-06-22 (×2): 40 meq via ORAL
  Filled 2022-06-22 (×2): qty 2

## 2022-06-22 MED ORDER — FLUTICASONE PROPIONATE 50 MCG/ACT NA SUSP
1.0000 | Freq: Every day | NASAL | Status: DC
  Administered 2022-06-22 – 2022-06-25 (×4): 1 via NASAL
  Filled 2022-06-22 (×2): qty 16

## 2022-06-22 MED ORDER — HYDROMORPHONE HCL 1 MG/ML IJ SOLN
1.0000 mg | INTRAMUSCULAR | Status: DC | PRN
  Administered 2022-06-22 – 2022-06-23 (×4): 1 mg via INTRAVENOUS
  Filled 2022-06-22 (×3): qty 1

## 2022-06-22 MED ORDER — DULOXETINE HCL 60 MG PO CPEP
60.0000 mg | ORAL_CAPSULE | Freq: Every day | ORAL | Status: DC
  Administered 2022-06-22 – 2022-06-25 (×4): 60 mg via ORAL
  Filled 2022-06-22: qty 1
  Filled 2022-06-22: qty 2
  Filled 2022-06-22 (×2): qty 1

## 2022-06-22 MED ORDER — ENOXAPARIN SODIUM 40 MG/0.4ML IJ SOSY
40.0000 mg | PREFILLED_SYRINGE | INTRAMUSCULAR | Status: DC
  Administered 2022-06-22: 40 mg via SUBCUTANEOUS
  Filled 2022-06-22: qty 0.4

## 2022-06-22 MED ORDER — NICOTINE 14 MG/24HR TD PT24
14.0000 mg | MEDICATED_PATCH | Freq: Every day | TRANSDERMAL | Status: DC
  Administered 2022-06-22 – 2022-06-25 (×4): 14 mg via TRANSDERMAL
  Filled 2022-06-22 (×4): qty 1

## 2022-06-22 MED ORDER — BUPROPION HCL ER (XL) 150 MG PO TB24
150.0000 mg | ORAL_TABLET | Freq: Every day | ORAL | Status: DC
  Administered 2022-06-22 – 2022-06-25 (×4): 150 mg via ORAL
  Filled 2022-06-22 (×4): qty 1

## 2022-06-22 MED ORDER — LORATADINE 10 MG PO TABS
10.0000 mg | ORAL_TABLET | Freq: Every day | ORAL | Status: DC
  Administered 2022-06-22 – 2022-06-25 (×4): 10 mg via ORAL
  Filled 2022-06-22 (×4): qty 1

## 2022-06-22 MED ORDER — SIMVASTATIN 20 MG PO TABS
20.0000 mg | ORAL_TABLET | Freq: Every day | ORAL | Status: DC
  Administered 2022-06-22 – 2022-06-24 (×3): 20 mg via ORAL
  Filled 2022-06-22: qty 1
  Filled 2022-06-22: qty 2
  Filled 2022-06-22: qty 1

## 2022-06-22 NOTE — Assessment & Plan Note (Signed)
-  Potassium 3.1 at time of admission -Will check magnesium level -Replete electrolytes and follow trend.

## 2022-06-22 NOTE — Assessment & Plan Note (Signed)
-  Appears to be stable -Continue treatment with loratadine and Flonase.

## 2022-06-22 NOTE — Assessment & Plan Note (Signed)
-  Stable mood -Continue home anxiolytic/antidepressant regimen.

## 2022-06-22 NOTE — ED Triage Notes (Addendum)
Pt BIB [facility] from home after mechanical fall tonight resulting in severe left hip pain. Pt unable to move left leg.  Pt given 189mg fentanyl and '4mg'$  zofran by EMS.

## 2022-06-22 NOTE — ED Provider Notes (Signed)
Due to the fact that [facility] does not have anesthesiology today I have been working to coordinate operative plan for the patient.  Current on-call orthopedic doctors at [facility] do not do total hip replacements.  Did discuss with Dr. Lucia Gaskins from orthopedics who states that the patient can come to [facility] and have her operation done tomorrow.  He states that it would be safe for her to wait 1 day for the procedure and would not be at increased risk of any sort of complications.  This was relayed to Dr. Dyann Kief who will transfer her to [facility].   Fransico Meadow, MD 06/22/22 [phone]

## 2022-06-22 NOTE — Progress Notes (Signed)
Patient ID: [name], female   DOB: [birth year], 56 y.o.   MRN: [id number]  Diagnosis left femoral neck fracture  Due to unfortunate circumstances, the hospital currently does not have anesthesiology coverage.  The patient has a left femoral neck fracture.  In an ideal situation the fracture should be done within 24 hours if not within 48 hours.  I recommended that the patient be transferred to [facility] to expedite her surgical treatment. However, the orthopedist there Dr.Adair has indicated that he does not do hip replacements.  The patient is 56 years old and meets the criteria for total hip replacement in the fracture setting.  Unfortunately the 2 orthopedic surgeons here also do not do total hip replacements on a routine basis even in the setting of arthritis.  I have also spoken to our hospital administrator Vi-Anne Antrum, and currently anesthesiology coverage for tomorrow has still not been secured.  It is my opinion that the patient should be transferred to a higher level of care to receive the total hip replacement and it would be advantageous for her to receive the surgery today.  However, under the circumstances we will have to hold off on transferring her until we can secure a surgeon who can do a total hip replacement in the fracture care setting.

## 2022-06-22 NOTE — ED Notes (Signed)
Pt given sprite at this time.

## 2022-06-22 NOTE — Assessment & Plan Note (Signed)
Continue statin 

## 2022-06-22 NOTE — ED Notes (Signed)
3rd vall to [facility] at this time for ortho consult. [name]

## 2022-06-22 NOTE — ED Notes (Signed)
2nd call to [facility] at this time for ortho consult. [name]

## 2022-06-22 NOTE — Assessment & Plan Note (Signed)
-  Cessation counseling provided -Nicotine patch has been ordered.

## 2022-06-22 NOTE — Assessment & Plan Note (Addendum)
-  Secondary to mechanical fall -Images demonstrating acute subcapital left femoral neck fracture with valgus impaction -Will require surgical repair -Continue analgesics and muscle relaxants -Follow orthopedic service recommendations -N.p.o. after midnight.

## 2022-06-22 NOTE — ED Provider Notes (Signed)
[facility] EMERGENCY DEPARTMENT Provider Note   CSN: [id number] Arrival date & time: 06/22/22  0405     History  Chief Complaint  Patient presents with  . Fall    [name] is a 56 y.o. female.  HPI     This is a 56 year old female who presents following a fall.  Patient reports that she slipped and fell on one of her grandchildren's toys.  She reports landing on her left hip.  She did not hit her head or lose consciousness.  She is not on any blood thinners.  Only complaining of pain of the left hip.  Denies numbness or tingling of the left lower extremity.  Home Medications Prior to Admission medications   Medication Sig Start Date End Date Taking? Authorizing Provider  alendronate (FOSAMAX) 70 MG tablet TAKE 1 TABLET WITH A FULL GLASS OF WATER ON AN EMPTY STOMACH ONCE A WEEK 04/17/22   Hawks, Christy A, FNP  buPROPion (WELLBUTRIN XL) 150 MG 24 hr tablet Take 1 tablet (150 mg total) by mouth daily. 09/19/21   Sharion Balloon, FNP  calcium-vitamin D (OSCAL WITH D) 500-200 MG-UNIT tablet Take 1 tablet by mouth daily. 04/23/16   Eckard, Tammy, RPH-CPP  cyclobenzaprine (FLEXERIL) 5 MG tablet TAKE 1 TABLET BY MOUTH 2 TIMES DAILY AS NEEDED FOR MUSCLE SPASMS. 02/21/22   Evelina Dun A, FNP  docusate sodium (COLACE) 100 MG capsule Take 100 mg by mouth daily.    [provider]  DULoxetine (CYMBALTA) 60 MG capsule Take 1 capsule (60 mg total) by mouth daily. 09/19/21   Evelina Dun A, FNP  fexofenadine (ALLEGRA) 180 MG tablet Take 180 mg by mouth daily. OTC    [provider]  fluticasone (FLONASE) 50 MCG/ACT nasal spray SPRAY 2 SPRAYS INTO EACH NOSTRIL EVERY DAY 04/17/22   Evelina Dun A, FNP  hydrOXYzine (VISTARIL) 25 MG capsule TAKE 1 CAPSULE BY MOUTH THREE TIMES A DAY AS NEEDED 01/17/22   Evelina Dun A, FNP  ibuprofen (ADVIL) 600 MG tablet TAKE 1 TABLET BY MOUTH EVERY 8 HOURS AS NEEDED 04/14/22   Evelina Dun A, FNP  nicotine polacrilex (NICORETTE) 4 MG  gum TAKE 1 EACH (4 MG TOTAL) BY MOUTH AS NEEDED FOR SMOKING CESSATION. 11/08/21   Sharion Balloon, FNP  Simethicone (GAS-X EXTRA STRENGTH) 125 MG CAPS Take 1 capsule (125 mg total) by mouth daily. 04/21/17   Sharion Balloon, FNP  simvastatin (ZOCOR) 20 MG tablet TAKE 1 TABLET BY MOUTH DAILY AT 6 PM. 09/19/21   Sharion Balloon, FNP      Allergies    Codeine and Hydrocodone    Review of Systems   Review of Systems  Constitutional:  Negative for fever.  Musculoskeletal:  Negative for back pain.       Hip pain  All other systems reviewed and are negative.   Physical Exam Updated Vital Signs BP 133/79   Pulse 86   Temp 97.9 F (36.6 C) (Oral)   Resp 18   Ht 1.676 m ('5\' 6"'$ )   Wt 51.8 kg   SpO2 98%   BMI 18.43 kg/m  Physical Exam Vitals and nursing note reviewed.  Constitutional:      Appearance: She is well-developed.     Comments: Chronically ill-appearing but nontoxic  HENT:     Head: Normocephalic and atraumatic.  Eyes:     Pupils: Pupils are equal, round, and reactive to light.  Cardiovascular:     Rate and Rhythm:  Normal rate and regular rhythm.     Heart sounds: Normal heart sounds.  Pulmonary:     Effort: Pulmonary effort is normal. No respiratory distress.     Breath sounds: No wheezing.  Abdominal:     Palpations: Abdomen is soft.     Tenderness: There is no abdominal tenderness.  Musculoskeletal:     Cervical back: Neck supple.     Comments: Deformity noted of the left hip with internal rotation, 2+ DP pulse distally, neurovascular intact  Skin:    General: Skin is warm and dry.  Neurological:     Mental Status: She is alert and oriented to person, place, and time.  Psychiatric:        Mood and Affect: Mood normal.     ED Results / Procedures / Treatments   Labs (all labs ordered are listed, but only abnormal results are displayed) Labs Reviewed  CBC WITH DIFFERENTIAL/PLATELET - Abnormal; Notable for the following components:      Result Value    WBC 12.7 (*)    RBC 3.56 (*)    Hemoglobin 11.0 (*)    HCT 33.3 (*)    Neutro Abs 9.4 (*)    Monocytes Absolute 1.6 (*)    Abs Immature Granulocytes 0.16 (*)    All other components within normal limits  BASIC METABOLIC PANEL - Abnormal; Notable for the following components:   Potassium 3.1 (*)    Glucose, Bld 109 (*)    Calcium 8.4 (*)    All other components within normal limits    EKG EKG Interpretation  Date/Time:  Sunday June 22 2022 04:18:27 EST Ventricular Rate:  87 PR Interval:  109 QRS Duration: 73 QT Interval:  374 QTC Calculation: 450 R Axis:   71 Text Interpretation: Sinus rhythm Short PR interval Confirmed by Thayer Jew [phone]) on 06/22/2022 5:28:11 AM  Radiology DG Hip Unilat W or Wo Pelvis 2-3 Views Left  Result Date: 06/22/2022 CLINICAL DATA:  56 year old female status post fall.  Pain. EXAM: DG HIP (WITH OR WITHOUT PELVIS) 2-3V LEFT COMPARISON:  Lumbar radiographs 12/11/2015. FINDINGS: On the [facility] view of the pelvis dextroconvex lumbar scoliosis appears progressed since 2017. The patient is somewhat oblique to the right. Femoral heads remain normally located. Mild pubic symphysis irregularity might be remote trauma. No acute pelvis fracture identified. Grossly intact proximal right femur. Acute left femoral neck fracture with varus impaction. Subcapital fracture location. The intertrochanteric segment appears to remain intact. IMPRESSION: 1. Acute subcapital left femoral neck fracture with varus impaction. 2. No other acute osseous abnormality identified. Electronically Signed   By: Genevie Ann M.D.   On: 06/22/2022 04:45    Procedures Procedures    Medications Ordered in ED Medications  morphine (PF) 4 MG/ML injection 4 mg (4 mg Intravenous Given 06/22/22 0516)  ondansetron (ZOFRAN) injection 4 mg (4 mg Intravenous Given 06/22/22 0516)    ED Course/ Medical Decision Making/ A&P Clinical Course as of 06/22/22 6387  [facility] Jun 22, 2022  0531 Spoke with  Dr. Aline Brochure, orthopedics.  No anesthesiology coverage currently.  Unclear when coverage will resume.  Will call orthopedics at [facility]. [CH]  [phone] Have attempted for over 1 hour to consult with orthopedics at [facility] via [place].  Have not received call back.  No further update on anesthesiology at this time. [CH]  3295 Spoke with Dr. Lucia Gaskins [CH]    Clinical Course User Index [CH] Marquite Attwood, Barbette Hair, MD  Medical Decision Making Amount and/or Complexity of Data Reviewed Labs: ordered. Radiology: ordered.  Risk Prescription drug management. Decision regarding hospitalization.   This patient presents to the ED for concern of left hip pain, this involves an extensive number of treatment options, and is a complaint that carries with it a high risk of complications and morbidity.  I considered the following differential and admission for this acute, potentially life threatening condition.  The differential diagnosis includes fracture, dislocation, contusion  MDM:    This is a's 56 year old female who presents with left hip pain.  Describes a mechanical fall.  She is overall nontoxic and vital signs are reassuring.  She is generally frail appearing and has a history of osteoporosis.  Concern for possible fracture on exam.  X-rays obtained and reviewed and shows subcapital left hip fracture.  Discussed with orthopedics as above.  Unfortunately, do not have anesthesiology coverage at this time.  Will attempt transfer to [facility].  Will admit to the hospitalist.  EKG obtained and reviewed and largely reassuring.  Labs notable for mild hypokalemia.  (Labs, imaging, consults)  Labs: I Ordered, and personally interpreted labs.  The pertinent results include: CBC, BMP  Imaging Studies ordered: I ordered imaging studies including left hip x-ray I independently visualized and interpreted imaging. I agree with the radiologist interpretation  Additional history obtained  from chart review.  External records from outside source obtained and reviewed including prior evaluations  Cardiac Monitoring: .The patient was maintained on a cardiac monitor.  I personally viewed and interpreted the cardiac monitored which showed an underlying rhythm of:  Sinus rhythm Reevaluation: After the interventions noted above, I reevaluated the patient and found that they have :stayed the same  Social Determinants of Health: . lives independently  Disposition: Admit  Co morbidities that complicate the patient evaluation . Past Medical History:  Diagnosis Date  . Anemia   . Anxiety   . Hyperlipidemia   . Hypertension   . Osteoporosis      Medicines Meds ordered this encounter  Medications  . morphine (PF) 4 MG/ML injection 4 mg  . ondansetron (ZOFRAN) injection 4 mg    I have reviewed the patients home medicines and have made adjustments as needed  Problem List / ED Course: Problem List Items Addressed This Visit   None Visit Diagnoses     Closed fracture of left hip, initial encounter ([place])    -  Primary                   Final Clinical Impression(s) / ED Diagnoses Final diagnoses:  Closed fracture of left hip, initial encounter [facility])    Rx / DC Orders ED Discharge Orders     None         Dina Rich, Barbette Hair, MD 06/22/22 [phone]

## 2022-06-22 NOTE — TOC Progression Note (Signed)
  Transition of Care [facility]) Screening Note   Patient Details  Name: ARYONA SILL Date of Birth: 23-Oct-1965   Transition of Care [facility]) CM/SW Contact:    Boneta Lucks, RN Phone Number: 06/22/2022, 3:02 PM    Transition of Care Department [facility]) has reviewed patient and no TOC needs have been identified at this time. We will continue to monitor patient advancement through interdisciplinary progression rounds. If new patient transition needs arise, please place a TOC consult.      Barriers to Discharge: Continued Medical Work up  Expected Discharge Plan and Services      Transfer to [facility] Surgical services.

## 2022-06-22 NOTE — H&P (Signed)
History and Physical    Patient: [name]:[id number] DOB: 03/22/1966 DOA: 06/22/2022 DOS: the patient was seen and examined on 06/22/2022 PCP: Sharion Balloon, FNP  Patient coming from: Home  Chief Complaint:  Chief Complaint  Patient presents with   Fall   HPI: [name] is a 56 y.o. female with medical history significant of tobacco abuse, osteoporosis, hypertension, hyperlipidemia and anxiety; presented to the hospital following mechanical fall after treatment with grandchildren toys at home.  Patient expressed landing on her left side and experiencing significant pain and inability to bear weight.  There was no complaints of dizziness, headaches, loss of consciousness or any prodromal symptoms prior to the fall.  Physical examination demonstrated internal rotation and shortening outpatient left lower extremity.  Any movement elicit significant pain.   Patient reports no chest pain, no fever, no nausea, no vomiting, no focal weaknesses, no dysuria, no hematuria, no melena, no hematochezia or any other complaints.  Due to inability to perform surgical repair and any pain (no anesthesia service available) patient will be transferred to [facility] for further management.  Review of Systems: As mentioned in the history of present illness. All other systems reviewed and are negative. Past Medical History:  Diagnosis Date   Anemia    Anxiety    Hyperlipidemia    Hypertension    Osteoporosis    Past Surgical History:  Procedure Laterality Date   ABDOMINAL HYSTERECTOMY     Partial   HARDWARE REMOVAL Left 06/20/2015   Procedure: HARDWARE REMOVAL;  Surgeon: Carole Civil, MD;  Location: [facility];  Service: Orthopedics;  Laterality: Left;  left knee   ORIF PATELLA Left 12/29/2014   Procedure: OPEN REDUCTION INTERNAL FIXATION LEFT PATELLA;  Surgeon: Carole Civil, MD;  Location: [facility];  Service: Orthopedics;  Laterality: Left;   Social History:  reports  that she has been smoking cigarettes. She has a 29.00 pack-year smoking history. She has never used smokeless tobacco. She reports that she does not drink alcohol and does not use drugs.  Allergies  Allergen Reactions   Codeine Other (See Comments)    Makes pt.have seizure   Hydrocodone     May have caused a seizure per patient reported.    Family History  Problem Relation Age of Onset   COPD Mother    Osteoporosis Mother    COPD Father    Osteoporosis Father    Colon cancer Neg Hx    Breast cancer Neg Hx     Prior to Admission medications   Medication Sig Start Date End Date Taking? Authorizing Provider  alendronate (FOSAMAX) 70 MG tablet TAKE 1 TABLET WITH A FULL GLASS OF WATER ON AN EMPTY STOMACH ONCE A WEEK 04/17/22   Hawks, Christy A, FNP  buPROPion (WELLBUTRIN XL) 150 MG 24 hr tablet Take 1 tablet (150 mg total) by mouth daily. 09/19/21   Sharion Balloon, FNP  calcium-vitamin D (OSCAL WITH D) 500-200 MG-UNIT tablet Take 1 tablet by mouth daily. 04/23/16   Eckard, Tammy, RPH-CPP  cyclobenzaprine (FLEXERIL) 5 MG tablet TAKE 1 TABLET BY MOUTH 2 TIMES DAILY AS NEEDED FOR MUSCLE SPASMS. 02/21/22   Evelina Dun A, FNP  docusate sodium (COLACE) 100 MG capsule Take 100 mg by mouth daily.    [provider]  DULoxetine (CYMBALTA) 60 MG capsule Take 1 capsule (60 mg total) by mouth daily. 09/19/21   Sharion Balloon, FNP  fexofenadine (ALLEGRA) 180 MG tablet Take 180  mg by mouth daily. OTC    [provider]  fluticasone (FLONASE) 50 MCG/ACT nasal spray SPRAY 2 SPRAYS INTO EACH NOSTRIL EVERY DAY 04/17/22   Evelina Dun A, FNP  hydrOXYzine (VISTARIL) 25 MG capsule TAKE 1 CAPSULE BY MOUTH THREE TIMES A DAY AS NEEDED 01/17/22   Evelina Dun A, FNP  ibuprofen (ADVIL) 600 MG tablet TAKE 1 TABLET BY MOUTH EVERY 8 HOURS AS NEEDED 04/14/22   Evelina Dun A, FNP  nicotine polacrilex (NICORETTE) 4 MG gum TAKE 1 EACH (4 MG TOTAL) BY MOUTH AS NEEDED FOR SMOKING CESSATION. 11/08/21    Sharion Balloon, FNP  Simethicone (GAS-X EXTRA STRENGTH) 125 MG CAPS Take 1 capsule (125 mg total) by mouth daily. 04/21/17   Sharion Balloon, FNP  simvastatin (ZOCOR) 20 MG tablet TAKE 1 TABLET BY MOUTH DAILY AT 6 PM. 09/19/21   Sharion Balloon, FNP    Physical Exam: Vitals:   06/22/22 0730 06/22/22 0800 06/22/22 0830 06/22/22 0906  BP: (!) 132/100 (!) 141/72 136/84   Pulse: 88 88 84   Resp: (!) '27 12 17   '$ Temp:    97.7 F (36.5 C)  TempSrc:    Oral  SpO2: 100% 98% 98%   Weight:      Height:       General exam: Alert, awake, oriented x 3; in no acute distress as long as she does not try to move.  Patient is afebrile. Respiratory system: Good air movement bilaterally; positive scattered rhonchi, no wheezing, no using accessory muscle.  Good saturation on room air. Cardiovascular system:RRR. No rubs or gallops; no JVD. Gastrointestinal system: Abdomen is nondistended, soft and nontender. No organomegaly or masses felt. Normal bowel sounds heard. Central nervous system: Alert and oriented. No focal neurological deficits. Extremities: No cyanosis or clubbing; left lower extremity with internal rotation and shortening on evaluation.  Tender to palpation. Skin: No petechiae Psychiatry: Judgement and insight appear normal. Mood & affect appropriate.   Data Reviewed: CBC: WBC 12.7, hemoglobin 11.0 platelet count 696 K Basic metabolic panel: Sodium 789, potassium 3.1, chloride 103, bicarb 28, BUN 13, creatinine 0.68 and GFR more than 60 Vitamin D level: 38.1 Magnesium: 1.9  Assessment and Plan: * Closed left hip fracture (HCC) -Secondary to mechanical fall -Images demonstrating acute subcapital left femoral neck fracture with valgus impaction -Will require surgical repair -Continue analgesics and muscle relaxants -Follow orthopedic service recommendations -N.p.o. after midnight.  Allergic rhinitis -Appears to be stable -Continue treatment with loratadine and  Flonase.  Hypokalemia -Potassium 3.1 at time of admission -Will check magnesium level -Replete electrolytes and follow trend.  Anxiety with depression -Stable mood -Continue home anxiolytic/antidepressant regimen.  Tobacco abuse -Cessation counseling provided -Nicotine patch has been ordered.  HLD (hyperlipidemia) -Continue statin -Heart healthy diet discussed with patient.      Advance Care Planning:   Code Status: Full Code   Consults: Orthopedic service.  Family Communication: Daughter at bedside  Severity of Illness: The appropriate patient status for this patient is INPATIENT. Inpatient status is judged to be reasonable and necessary in order to provide the required intensity of service to ensure the patient's safety. The patient's presenting symptoms, physical exam findings, and initial radiographic and laboratory data in the context of their chronic comorbidities is felt to place them at high risk for further clinical deterioration. Furthermore, it is not anticipated that the patient will be medically stable for discharge from the hospital within 2 midnights of admission.   * I  certify that at the point of admission it is my clinical judgment that the patient will require inpatient hospital care spanning beyond 2 midnights from the point of admission due to high intensity of service, high risk for further deterioration and high frequency of surveillance required.*  Author: Barton Dubois, MD 06/22/2022 9:40 AM  For on call review [email or web address].

## 2022-06-23 ENCOUNTER — Inpatient Hospital Stay (HOSPITAL_COMMUNITY): Payer: Medicaid Other

## 2022-06-23 ENCOUNTER — Inpatient Hospital Stay (HOSPITAL_COMMUNITY): Payer: Medicaid Other | Admitting: Anesthesiology

## 2022-06-23 ENCOUNTER — Encounter (HOSPITAL_COMMUNITY): Payer: Self-pay | Admitting: Internal Medicine

## 2022-06-23 ENCOUNTER — Encounter (HOSPITAL_COMMUNITY)
Admission: EM | Disposition: A | Discharge status: Home / Self Care | Payer: Self-pay | Source: Home / Self Care | Attending: Internal Medicine

## 2022-06-23 ENCOUNTER — Other Ambulatory Visit: Payer: Self-pay

## 2022-06-23 DIAGNOSIS — S72002A Fracture of unspecified part of neck of left femur, initial encounter for closed fracture: Secondary | ICD-10-CM

## 2022-06-23 DIAGNOSIS — F17210 Nicotine dependence, cigarettes, uncomplicated: Secondary | ICD-10-CM

## 2022-06-23 DIAGNOSIS — D649 Anemia, unspecified: Secondary | ICD-10-CM

## 2022-06-23 DIAGNOSIS — I10 Essential (primary) hypertension: Secondary | ICD-10-CM

## 2022-06-23 HISTORY — PX: TOTAL HIP ARTHROPLASTY: SHX124

## 2022-06-23 LAB — URINALYSIS, ROUTINE W REFLEX MICROSCOPIC
Bilirubin Urine: NEGATIVE
Glucose, UA: NEGATIVE mg/dL
Hgb urine dipstick: NEGATIVE
Ketones, ur: 5 mg/dL — AB
Leukocytes,Ua: NEGATIVE
Nitrite: NEGATIVE
Protein, ur: NEGATIVE mg/dL
Specific Gravity, Urine: 1.013 (ref 1.005–1.030)
pH: 6 (ref 5.0–8.0)

## 2022-06-23 LAB — BASIC METABOLIC PANEL
Anion gap: 6 (ref 5–15)
BUN: 8 mg/dL (ref 6–20)
CO2: 28 mmol/L (ref 22–32)
Calcium: 8.4 mg/dL — ABNORMAL LOW (ref 8.9–10.3)
Chloride: 100 mmol/L (ref 98–111)
Creatinine, Ser: 0.47 mg/dL (ref 0.44–1.00)
GFR, Estimated: >60 mL/min (ref >60)
Glucose, Bld: 112 mg/dL — ABNORMAL HIGH (ref 70–99)
Potassium: 3.5 mmol/L (ref 3.5–5.1)
Sodium: 134 mmol/L — ABNORMAL LOW (ref 135–145)

## 2022-06-23 LAB — CBC
HCT: 36.3 % (ref 36.0–46.0)
Hemoglobin: 11.7 g/dL — ABNORMAL LOW (ref 12.0–15.0)
MCH: 30.6 pg (ref 26.0–34.0)
MCHC: 32.2 g/dL (ref 30.0–36.0)
MCV: 95 fL (ref 80.0–100.0)
Platelets: 401 10*3/uL — ABNORMAL HIGH (ref 150–400)
RBC: 3.82 MIL/uL — ABNORMAL LOW (ref 3.87–5.11)
RDW: 12.9 % (ref 11.5–15.5)
WBC: 14.9 10*3/uL — ABNORMAL HIGH (ref 4.0–10.5)
nRBC: 0 % (ref 0.0–0.2)

## 2022-06-23 LAB — SURGICAL PCR SCREEN
MRSA, PCR: NEGATIVE
Staphylococcus aureus: POSITIVE — AB

## 2022-06-23 LAB — MAGNESIUM: Magnesium: 1.7 mg/dL (ref 1.7–2.4)

## 2022-06-23 SURGERY — ARTHROPLASTY, HIP, TOTAL, ANTERIOR APPROACH
Anesthesia: General | Site: Hip | Laterality: Left

## 2022-06-23 MED ORDER — SODIUM CHLORIDE 0.9 % IV SOLN: INTRAVENOUS | Status: DC

## 2022-06-23 MED ORDER — ACETAMINOPHEN 160 MG/5ML PO SOLN: 1000.0000 mg | Freq: Once | ORAL | Status: DC | PRN

## 2022-06-23 MED ORDER — SUGAMMADEX SODIUM 200 MG/2ML IV SOLN
INTRAVENOUS | Status: DC | PRN
  Administered 2022-06-23: 200 mg via INTRAVENOUS

## 2022-06-23 MED ORDER — DEXMEDETOMIDINE HCL IN NACL 80 MCG/20ML IV SOLN
INTRAVENOUS | Status: AC
  Filled 2022-06-23: qty 20

## 2022-06-23 MED ORDER — TRANEXAMIC ACID-NACL 1000-0.7 MG/100ML-% IV SOLN
1000.0000 mg | INTRAVENOUS | Status: DC
  Administered 2022-06-23: 1000 mg via INTRAVENOUS

## 2022-06-23 MED ORDER — PROPOFOL 10 MG/ML IV BOLUS
INTRAVENOUS | Status: AC
  Filled 2022-06-23: qty 20

## 2022-06-23 MED ORDER — SENNA 8.6 MG PO TABS
1.0000 | ORAL_TABLET | Freq: Two times a day (BID) | ORAL | Status: DC
  Administered 2022-06-23 – 2022-06-25 (×4): 8.6 mg via ORAL
  Filled 2022-06-23 (×4): qty 1

## 2022-06-23 MED ORDER — METHOCARBAMOL 500 MG PO TABS
500.0000 mg | ORAL_TABLET | Freq: Four times a day (QID) | ORAL | Status: DC | PRN
  Administered 2022-06-23 – 2022-06-25 (×5): 500 mg via ORAL
  Filled 2022-06-23 (×5): qty 1

## 2022-06-23 MED ORDER — LACTATED RINGERS IV SOLN: INTRAVENOUS | Status: DC

## 2022-06-23 MED ORDER — OXYCODONE HCL 5 MG PO TABS
5.0000 mg | ORAL_TABLET | ORAL | Status: DC | PRN
  Administered 2022-06-23 – 2022-06-24 (×3): 10 mg via ORAL
  Administered 2022-06-25 (×2): 5 mg via ORAL
  Filled 2022-06-23: qty 2
  Filled 2022-06-23: qty 1
  Filled 2022-06-23: qty 2
  Filled 2022-06-23: qty 1
  Filled 2022-06-23 (×2): qty 2

## 2022-06-23 MED ORDER — CHLORHEXIDINE GLUCONATE 4 % EX LIQD: 60.0000 mL | Freq: Once | CUTANEOUS | Status: DC

## 2022-06-23 MED ORDER — SODIUM CHLORIDE 0.9 % IR SOLN
Status: DC | PRN
  Administered 2022-06-23: 3000 mL

## 2022-06-23 MED ORDER — PHENOL 1.4 % MT LIQD: 1.0000 | OROMUCOSAL | Status: DC | PRN

## 2022-06-23 MED ORDER — KETOROLAC TROMETHAMINE 30 MG/ML IJ SOLN
INTRAMUSCULAR | Status: AC
  Filled 2022-06-23: qty 1

## 2022-06-23 MED ORDER — TRANEXAMIC ACID-NACL 1000-0.7 MG/100ML-% IV SOLN
INTRAVENOUS | Status: AC
  Filled 2022-06-23: qty 100

## 2022-06-23 MED ORDER — HYDROMORPHONE HCL 1 MG/ML IJ SOLN
INTRAMUSCULAR | Status: AC
  Filled 2022-06-23: qty 1

## 2022-06-23 MED ORDER — FENTANYL CITRATE (PF) 250 MCG/5ML IJ SOLN
INTRAMUSCULAR | Status: DC | PRN
  Administered 2022-06-23: 50 ug via INTRAVENOUS
  Administered 2022-06-23 (×2): 100 ug via INTRAVENOUS

## 2022-06-23 MED ORDER — LIDOCAINE 2% (20 MG/ML) 5 ML SYRINGE
INTRAMUSCULAR | Status: AC
  Filled 2022-06-23: qty 5

## 2022-06-23 MED ORDER — ROCURONIUM BROMIDE 10 MG/ML (PF) SYRINGE
PREFILLED_SYRINGE | INTRAVENOUS | Status: AC
  Filled 2022-06-23: qty 10

## 2022-06-23 MED ORDER — SODIUM CHLORIDE (PF) 0.9 % IJ SOLN
INTRAMUSCULAR | Status: AC
  Filled 2022-06-23: qty 50

## 2022-06-23 MED ORDER — METHOCARBAMOL 1000 MG/10ML IJ SOLN: 500.0000 mg | Freq: Four times a day (QID) | INTRAVENOUS | Status: DC | PRN

## 2022-06-23 MED ORDER — CEFAZOLIN SODIUM-DEXTROSE 2-4 GM/100ML-% IV SOLN
2.0000 g | INTRAVENOUS | Status: DC
  Administered 2022-06-23: 2 g via INTRAVENOUS

## 2022-06-23 MED ORDER — CEFAZOLIN SODIUM-DEXTROSE 2-4 GM/100ML-% IV SOLN
2.0000 g | Freq: Four times a day (QID) | INTRAVENOUS | Status: AC
  Administered 2022-06-23 (×2): 2 g via INTRAVENOUS
  Filled 2022-06-23 (×2): qty 100

## 2022-06-23 MED ORDER — OXYCODONE HCL 5 MG PO TABS
10.0000 mg | ORAL_TABLET | ORAL | Status: DC | PRN
  Administered 2022-06-24: 10 mg via ORAL

## 2022-06-23 MED ORDER — ALUM & MAG HYDROXIDE-SIMETH 200-200-20 MG/5ML PO SUSP
30.0000 mL | ORAL | Status: DC | PRN
  Administered 2022-06-24: 30 mL via ORAL
  Filled 2022-06-23: qty 30

## 2022-06-23 MED ORDER — ORAL CARE MOUTH RINSE: 15.0000 mL | Freq: Once | OROMUCOSAL | Status: AC

## 2022-06-23 MED ORDER — MIDAZOLAM HCL 2 MG/2ML IJ SOLN
INTRAMUSCULAR | Status: DC | PRN
  Administered 2022-06-23: 2 mg via INTRAVENOUS

## 2022-06-23 MED ORDER — DEXAMETHASONE SODIUM PHOSPHATE 10 MG/ML IJ SOLN
INTRAMUSCULAR | Status: DC | PRN
  Administered 2022-06-23: 5 mg via INTRAVENOUS

## 2022-06-23 MED ORDER — PHENYLEPHRINE HCL (PRESSORS) 10 MG/ML IV SOLN
INTRAVENOUS | Status: DC | PRN
  Administered 2022-06-23 (×2): 40 ug via INTRAVENOUS
  Administered 2022-06-23: 80 ug via INTRAVENOUS

## 2022-06-23 MED ORDER — DEXMEDETOMIDINE HCL IN NACL 80 MCG/20ML IV SOLN
INTRAVENOUS | Status: DC | PRN
  Administered 2022-06-23: 8 ug via BUCCAL

## 2022-06-23 MED ORDER — HYDROMORPHONE HCL 1 MG/ML IJ SOLN: 0.5000 mg | INTRAMUSCULAR | Status: DC | PRN

## 2022-06-23 MED ORDER — ONDANSETRON HCL 4 MG/2ML IJ SOLN
INTRAMUSCULAR | Status: DC | PRN
  Administered 2022-06-23: 4 mg via INTRAVENOUS

## 2022-06-23 MED ORDER — MIDAZOLAM HCL 2 MG/2ML IJ SOLN
INTRAMUSCULAR | Status: AC
  Filled 2022-06-23: qty 2

## 2022-06-23 MED ORDER — POVIDONE-IODINE 10 % EX SWAB
2.0000 | Freq: Once | CUTANEOUS | Status: AC
  Administered 2022-06-23: 2 via TOPICAL

## 2022-06-23 MED ORDER — OXYCODONE HCL 5 MG PO TABS: 5.0000 mg | ORAL_TABLET | Freq: Once | ORAL | Status: DC | PRN

## 2022-06-23 MED ORDER — CHLORHEXIDINE GLUCONATE 0.12 % MT SOLN
OROMUCOSAL | Status: AC
  Administered 2022-06-23: 15 mL via OROMUCOSAL
  Filled 2022-06-23: qty 15

## 2022-06-23 MED ORDER — FENTANYL CITRATE (PF) 250 MCG/5ML IJ SOLN
INTRAMUSCULAR | Status: AC
  Filled 2022-06-23: qty 5

## 2022-06-23 MED ORDER — PHENYLEPHRINE 80 MCG/ML (10ML) SYRINGE FOR IV PUSH (FOR BLOOD PRESSURE SUPPORT)
PREFILLED_SYRINGE | INTRAVENOUS | Status: AC
  Filled 2022-06-23: qty 20

## 2022-06-23 MED ORDER — ACETAMINOPHEN 500 MG PO TABS
ORAL_TABLET | ORAL | Status: AC
  Administered 2022-06-23: 1000 mg via ORAL
  Filled 2022-06-23: qty 2

## 2022-06-23 MED ORDER — ASPIRIN 81 MG PO CHEW
81.0000 mg | CHEWABLE_TABLET | Freq: Two times a day (BID) | ORAL | Status: DC
  Administered 2022-06-23 – 2022-06-25 (×4): 81 mg via ORAL
  Filled 2022-06-23 (×4): qty 1

## 2022-06-23 MED ORDER — FENTANYL CITRATE (PF) 100 MCG/2ML IJ SOLN: 25.0000 ug | INTRAMUSCULAR | Status: DC | PRN

## 2022-06-23 MED ORDER — SODIUM CHLORIDE 0.9 % IV SOLN: INTRAVENOUS | Status: AC

## 2022-06-23 MED ORDER — DIPHENHYDRAMINE HCL 12.5 MG/5ML PO ELIX: 12.5000 mg | ORAL_SOLUTION | ORAL | Status: DC | PRN

## 2022-06-23 MED ORDER — PHENYLEPHRINE 80 MCG/ML (10ML) SYRINGE FOR IV PUSH (FOR BLOOD PRESSURE SUPPORT)
PREFILLED_SYRINGE | INTRAVENOUS | Status: AC
  Filled 2022-06-23: qty 10

## 2022-06-23 MED ORDER — METOCLOPRAMIDE HCL 5 MG/ML IJ SOLN: 5.0000 mg | Freq: Three times a day (TID) | INTRAMUSCULAR | Status: DC | PRN

## 2022-06-23 MED ORDER — ACETAMINOPHEN 500 MG PO TABS: 1000.0000 mg | ORAL_TABLET | Freq: Once | ORAL | Status: DC | PRN

## 2022-06-23 MED ORDER — MUPIROCIN 2 % EX OINT
1.0000 | TOPICAL_OINTMENT | Freq: Two times a day (BID) | CUTANEOUS | Status: DC
  Administered 2022-06-23 – 2022-06-25 (×4): 1 via NASAL
  Filled 2022-06-23: qty 22

## 2022-06-23 MED ORDER — MENTHOL 3 MG MT LOZG: 1.0000 | LOZENGE | OROMUCOSAL | Status: DC | PRN

## 2022-06-23 MED ORDER — LIDOCAINE 2% (20 MG/ML) 5 ML SYRINGE
INTRAMUSCULAR | Status: DC | PRN
  Administered 2022-06-23: 50 mg via INTRAVENOUS

## 2022-06-23 MED ORDER — ACETAMINOPHEN 325 MG PO TABS: 325.0000 mg | ORAL_TABLET | Freq: Four times a day (QID) | ORAL | Status: DC | PRN

## 2022-06-23 MED ORDER — OXYCODONE HCL 5 MG/5ML PO SOLN: 5.0000 mg | Freq: Once | ORAL | Status: DC | PRN

## 2022-06-23 MED ORDER — POVIDONE-IODINE 10 % EX SWAB: 2.0000 | Freq: Once | CUTANEOUS | Status: DC

## 2022-06-23 MED ORDER — SODIUM CHLORIDE (PF) 0.9 % IJ SOLN
INTRAMUSCULAR | Status: DC | PRN
  Administered 2022-06-23: 61 mL via INTRAMUSCULAR

## 2022-06-23 MED ORDER — ONDANSETRON HCL 4 MG PO TABS: 4.0000 mg | ORAL_TABLET | Freq: Four times a day (QID) | ORAL | Status: DC | PRN

## 2022-06-23 MED ORDER — CHLORHEXIDINE GLUCONATE CLOTH 2 % EX PADS
6.0000 | MEDICATED_PAD | Freq: Every day | CUTANEOUS | Status: DC
  Administered 2022-06-24: 6 via TOPICAL

## 2022-06-23 MED ORDER — BUPIVACAINE-EPINEPHRINE (PF) 0.5% -1:200000 IJ SOLN
INTRAMUSCULAR | Status: AC
  Filled 2022-06-23: qty 30

## 2022-06-23 MED ORDER — ACETAMINOPHEN 500 MG PO TABS: 1000.0000 mg | ORAL_TABLET | Freq: Once | ORAL | Status: AC

## 2022-06-23 MED ORDER — METOCLOPRAMIDE HCL 5 MG PO TABS: 5.0000 mg | ORAL_TABLET | Freq: Three times a day (TID) | ORAL | Status: DC | PRN

## 2022-06-23 MED ORDER — POLYETHYLENE GLYCOL 3350 17 G PO PACK: 17.0000 g | PACK | Freq: Every day | ORAL | Status: DC | PRN

## 2022-06-23 MED ORDER — ACETAMINOPHEN 10 MG/ML IV SOLN: 1000.0000 mg | Freq: Once | INTRAVENOUS | Status: DC | PRN

## 2022-06-23 MED ORDER — 0.9 % SODIUM CHLORIDE (POUR BTL) OPTIME
TOPICAL | Status: DC | PRN
  Administered 2022-06-23: 1000 mL

## 2022-06-23 MED ORDER — CELECOXIB 200 MG PO CAPS
200.0000 mg | ORAL_CAPSULE | Freq: Two times a day (BID) | ORAL | Status: DC
  Administered 2022-06-23 – 2022-06-25 (×5): 200 mg via ORAL
  Filled 2022-06-23 (×5): qty 1

## 2022-06-23 MED ORDER — DOCUSATE SODIUM 100 MG PO CAPS
100.0000 mg | ORAL_CAPSULE | Freq: Two times a day (BID) | ORAL | Status: DC
  Administered 2022-06-23 – 2022-06-25 (×4): 100 mg via ORAL
  Filled 2022-06-23 (×4): qty 1

## 2022-06-23 MED ORDER — ROCURONIUM BROMIDE 10 MG/ML (PF) SYRINGE
PREFILLED_SYRINGE | INTRAVENOUS | Status: DC | PRN
  Administered 2022-06-23: 50 mg via INTRAVENOUS
  Administered 2022-06-23: 30 mg via INTRAVENOUS
  Administered 2022-06-23: 20 mg via INTRAVENOUS

## 2022-06-23 MED ORDER — ONDANSETRON HCL 4 MG/2ML IJ SOLN: 4.0000 mg | Freq: Four times a day (QID) | INTRAMUSCULAR | Status: DC | PRN

## 2022-06-23 MED ORDER — CHLORHEXIDINE GLUCONATE 0.12 % MT SOLN: 15.0000 mL | Freq: Once | OROMUCOSAL | Status: AC

## 2022-06-23 MED ORDER — PROPOFOL 10 MG/ML IV BOLUS
INTRAVENOUS | Status: DC | PRN
  Administered 2022-06-23: 60 mg via INTRAVENOUS

## 2022-06-23 MED ORDER — CEFAZOLIN SODIUM-DEXTROSE 2-4 GM/100ML-% IV SOLN
INTRAVENOUS | Status: AC
  Filled 2022-06-23: qty 100

## 2022-06-23 MED ORDER — IRRISEPT - 450ML BOTTLE WITH 0.05% CHG IN STERILE WATER, USP 99.95% OPTIME
TOPICAL | Status: DC | PRN
  Administered 2022-06-23: 450 mL

## 2022-06-23 SURGICAL SUPPLY — 63 items
ALCOHOL 70% 16 OZ (MISCELLANEOUS) ×1 IMPLANT
BAG COUNTER SPONGE SURGICOUNT (BAG) ×1 IMPLANT
BLADE CLIPPER SURG (BLADE) IMPLANT
CHLORAPREP W/TINT 26 (MISCELLANEOUS) ×1 IMPLANT
COVER SURGICAL LIGHT HANDLE (MISCELLANEOUS) ×1 IMPLANT
DERMABOND ADVANCED .7 DNX12 (GAUZE/BANDAGES/DRESSINGS) ×2 IMPLANT
DRAPE C-ARM 42X72 X-RAY (DRAPES) ×1 IMPLANT
DRAPE STERI IOBAN 125X83 (DRAPES) ×1 IMPLANT
DRAPE U-SHAPE 47X51 STRL (DRAPES) ×3 IMPLANT
DRSG AQUACEL AG ADV 3.5X10 (GAUZE/BANDAGES/DRESSINGS) ×1 IMPLANT
ELECT BLADE 4.0 EZ CLEAN MEGAD (MISCELLANEOUS) ×1
ELECT PENCIL ROCKER SW 15FT (MISCELLANEOUS) ×1 IMPLANT
ELECT REM PT RETURN 9FT ADLT (ELECTROSURGICAL) ×1
ELECTRODE BLDE 4.0 EZ CLN MEGD (MISCELLANEOUS) ×1 IMPLANT
ELECTRODE REM PT RTRN 9FT ADLT (ELECTROSURGICAL) ×1 IMPLANT
EVACUATOR 1/8 PVC DRAIN (DRAIN) IMPLANT
GLOVE BIO SURGEON STRL SZ8.5 (GLOVE) ×2 IMPLANT
GLOVE BIOGEL M 7.0 STRL (GLOVE) ×1 IMPLANT
GLOVE BIOGEL PI IND STRL 7.5 (GLOVE) ×1 IMPLANT
GLOVE BIOGEL PI IND STRL 8.5 (GLOVE) ×1 IMPLANT
GOWN STRL REUS W/ TWL LRG LVL3 (GOWN DISPOSABLE) ×2 IMPLANT
GOWN STRL REUS W/ TWL XL LVL3 (GOWN DISPOSABLE) ×1 IMPLANT
GOWN STRL REUS W/TWL 2XL LVL3 (GOWN DISPOSABLE) ×1 IMPLANT
GOWN STRL REUS W/TWL LRG LVL3 (GOWN DISPOSABLE) ×2
GOWN STRL REUS W/TWL XL LVL3 (GOWN DISPOSABLE) ×1
HANDPIECE INTERPULSE COAX TIP (DISPOSABLE) ×1
HEAD CERAMIC BIOLOX 32 TP1 -3 (Head) IMPLANT
HOOD PEEL AWAY FACE SHEILD DIS (HOOD) ×2 IMPLANT
JET LAVAGE IRRISEPT WOUND (IRRIGATION / IRRIGATOR) ×1
KIT BASIN OR (CUSTOM PROCEDURE TRAY) ×1 IMPLANT
KIT TURNOVER KIT B (KITS) ×1 IMPLANT
LAVAGE JET IRRISEPT WOUND (IRRIGATION / IRRIGATOR) ×1 IMPLANT
LINER ACETAB NEUTRAL 7/C 32 (Liner) IMPLANT
MANIFOLD NEPTUNE II (INSTRUMENTS) ×1 IMPLANT
MARKER SKIN DUAL TIP RULER LAB (MISCELLANEOUS) ×2 IMPLANT
NDL SPNL 18GX3.5 QUINCKE PK (NEEDLE) ×1 IMPLANT
NEEDLE SPNL 18GX3.5 QUINCKE PK (NEEDLE) ×1 IMPLANT
NS IRRIG 1000ML POUR BTL (IV SOLUTION) ×1 IMPLANT
PACK TOTAL JOINT (CUSTOM PROCEDURE TRAY) ×1 IMPLANT
PACK UNIVERSAL I (CUSTOM PROCEDURE TRAY) ×1 IMPLANT
PAD ARMBOARD 7.5X6 YLW CONV (MISCELLANEOUS) ×2 IMPLANT
SAW OSC TIP CART 19.5X105X1.3 (SAW) ×1 IMPLANT
SEALER BIPOLAR AQUA 6.0 (INSTRUMENTS) IMPLANT
SET HNDPC FAN SPRY TIP SCT (DISPOSABLE) ×1 IMPLANT
SHELL ACET G7 3H 48 SZC (Shell) IMPLANT
SOL PREP POV-IOD 4OZ 10% (MISCELLANEOUS) ×1 IMPLANT
STAPLER VISISTAT 35W (STAPLE) IMPLANT
STEM FEM CMTLS 13 146 (Stem) IMPLANT
SUT ETHIBOND NAB CT1 #1 30IN (SUTURE) ×2 IMPLANT
SUT MNCRL AB 3-0 PS2 18 (SUTURE) ×1 IMPLANT
SUT MON AB 2-0 CT1 36 (SUTURE) ×1 IMPLANT
SUT VIC AB 2-0 CT1 27 (SUTURE) ×1
SUT VIC AB 2-0 CT1 TAPERPNT 27 (SUTURE) ×1 IMPLANT
SUT VLOC 180 0 24IN GS25 (SUTURE) ×1 IMPLANT
SYR 50ML LL SCALE MARK (SYRINGE) ×1 IMPLANT
TOWEL GREEN STERILE (TOWEL DISPOSABLE) ×1 IMPLANT
TOWEL GREEN STERILE FF (TOWEL DISPOSABLE) ×1 IMPLANT
TRAY CATH 16FR W/PLASTIC CATH (SET/KITS/TRAYS/PACK) IMPLANT
TRAY FOLEY W/BAG SLVR 14FR (SET/KITS/TRAYS/PACK) IMPLANT
TRAY FOLEY W/BAG SLVR 16FR (SET/KITS/TRAYS/PACK)
TRAY FOLEY W/BAG SLVR 16FR ST (SET/KITS/TRAYS/PACK) IMPLANT
TUBE SUCT ARGYLE STRL (TUBING) ×1 IMPLANT
WATER STERILE IRR 1000ML POUR (IV SOLUTION) ×3 IMPLANT

## 2022-06-23 NOTE — Discharge Instructions (Signed)
 Dr. Kieran Nachtigal Joint Replacement Specialist [facility] [address] [place], [place] [address] [phone]   TOTAL HIP REPLACEMENT POSTOPERATIVE DIRECTIONS    Hip Rehabilitation, Guidelines Following Surgery   WEIGHT BEARING Weight bearing as tolerated with assist device (walker, cane, etc) as directed, use it as long as suggested by your surgeon or therapist, typically at least 4-6 weeks.  The results of a hip operation are greatly improved after range of motion and muscle strengthening exercises. Follow all safety measures which are given to protect your hip. If any of these exercises cause increased pain or swelling in your joint, decrease the amount until you are comfortable again. Then slowly increase the exercises. Call your caregiver if you have problems or questions.   HOME CARE INSTRUCTIONS  Most of the following instructions are designed to prevent the dislocation of your new hip.  Remove items at home which could result in a fall. This includes throw rugs or furniture in walking pathways.  Continue medications as instructed at time of discharge. You may have some home medications which will be placed on hold until you complete the course of blood thinner medication. You may start showering once you are discharged home. Do not remove your dressing. Do not put on socks or shoes without following the instructions of your caregivers.   Sit on chairs with arms. Use the chair arms to help push yourself up when arising.  Arrange for the use of a toilet seat elevator so you are not sitting low.  Walk with walker as instructed.  You may resume a sexual relationship in one month or when given the OK by your caregiver.  Use walker as long as suggested by your caregivers.  You may put full weight on your legs and walk as much as is comfortable. Avoid periods of inactivity such as sitting longer than an hour when not asleep. This helps prevent blood  clots.  You may return to work once you are cleared by your surgeon.  Do not drive a car for 6 weeks or until released by your surgeon.  Do not drive while taking narcotics.  Wear elastic stockings for two weeks following surgery during the day but you may remove then at night.  Make sure you keep all of your appointments after your operation with all of your doctors and caregivers. You should call the office at the above phone number and make an appointment for approximately two weeks after the date of your surgery. Please pick up a stool softener and laxative for home use as long as you are requiring pain medications. ICE to the affected hip every three hours for 30 minutes at a time and then as needed for pain and swelling. Continue to use ice on the hip for pain and swelling from surgery. You may notice swelling that will progress down to the foot and ankle.  This is normal after surgery.  Elevate the leg when you are not up walking on it.   It is important for you to complete the blood thinner medication as prescribed by your doctor. Continue to use the breathing machine which will help keep your temperature down.  It is common for your temperature to cycle up and down following surgery, especially at night when you are not up moving around and exerting yourself.  The breathing machine keeps your lungs expanded and your temperature down.  RANGE OF MOTION AND STRENGTHENING EXERCISES  These exercises are designed to help you   keep full movement of your hip joint. Follow your caregiver's or physical therapist's instructions. Perform all exercises about fifteen times, three times per day or as directed. Exercise both hips, even if you have had only one joint replacement. These exercises can be done on a training (exercise) mat, on the floor, on a table or on a bed. Use whatever works the best and is most comfortable for you. Use music or television while you are exercising so that the exercises are a  pleasant break in your day. This will make your life better with the exercises acting as a break in routine you can look forward to.  ?Lying on your back, slowly slide your foot toward your buttocks, raising your knee up off the floor. Then slowly slide your foot back down until your leg is straight again.  ?Lying on your back spread your legs as far apart as you can without causing discomfort.  ?Lying on your side, raise your upper leg and foot straight up from the floor as far as is comfortable. Slowly lower the leg and repeat.  ?Lying on your back, tighten up the muscle in the front of your thigh (quadriceps muscles). You can do this by keeping your leg straight and trying to raise your heel off the floor. This helps strengthen the largest muscle supporting your knee.  ?Lying on your back, tighten up the muscles of your buttocks both with the legs straight and with the knee bent at a comfortable angle while keeping your heel on the floor.  ? ?SKILLED REHAB INSTRUCTIONS: ?If the patient is transferred to a skilled rehab facility following release from the hospital, a list of the current medications will be sent to the facility for the patient to continue.  When discharged from the skilled rehab facility, please have the facility set up the patient's Home Health Physical Therapy prior to being released. Also, the skilled facility will be responsible for providing the patient with their medications at time of release from the facility to include their pain medication and their blood thinner medication. If the patient is still at the rehab facility at time of the two week follow up appointment, the skilled rehab facility will also need to assist the patient in arranging follow up appointment in our office and any transportation needs. ? ?POST-OPERATIVE OPIOID TAPER INSTRUCTIONS: ?It is important to wean off of your opioid medication as soon as possible. If you do not need pain medication after your surgery it is ok  to stop day one. ?Opioids include: ?Codeine, Hydrocodone(Norco, Vicodin), Oxycodone(Percocet, oxycontin) and hydromorphone amongst others.  ?Long term and even short term use of opiods can cause: ?Increased pain response ?Dependence ?Constipation ?Depression ?Respiratory depression ?And more.  ?Withdrawal symptoms can include ?Flu like symptoms ?Nausea, vomiting ?And more ?Techniques to manage these symptoms ?Hydrate well ?Eat regular healthy meals ?Stay active ?Use relaxation techniques(deep breathing, meditating, yoga) ?Do Not substitute Alcohol to help with tapering ?If you have been on opioids for less than two weeks and do not have pain than it is ok to stop all together.  ?Plan to wean off of opioids ?This plan should start within one week post op of your joint replacement. ?Maintain the same interval or time between taking each dose and first decrease the dose.  ?Cut the total daily intake of opioids by one tablet each day ?Next start to increase the time between doses. ?The last dose that should be eliminated is the evening dose.  ? ? ?MAKE   SURE YOU:  Understand these instructions.  Will watch your condition.  Will get help right away if you are not doing well or get worse.  Pick up stool softner and laxative for home use following surgery while on pain medications. Do not remove your dressing. The dressing is waterproof--it is OK to take showers. Continue to use ice for pain and swelling after surgery. Do not use any lotions or creams on the incision until instructed by your surgeon. Total Hip Protocol. Do not take alendronate (Fosamax) until further instructed by your surgeon.

## 2022-06-23 NOTE — Transfer of Care (Signed)
Immediate Anesthesia Transfer of Care Note  Patient: [name]  Procedure(s) Performed: TOTAL HIP ARTHROPLASTY ANTERIOR APPROACH (Left: Hip)  Patient Location: PACU  Anesthesia Type:General  Level of Consciousness: awake, alert , and oriented  Airway & Oxygen Therapy: Patient connected to face mask oxygen  Post-op Assessment: Post -op Vital signs reviewed and stable  Post vital signs: stable  Last Vitals:  Vitals Value Taken Time  BP    Temp    Pulse    Resp    SpO2      Last Pain:  Vitals:   06/23/22 1219  TempSrc:   PainSc: 10-Worst pain ever      Patients Stated Pain Goal: 2 (20/72/18 2883)  Complications: No notable events documented.

## 2022-06-23 NOTE — Progress Notes (Signed)
Called ortho tech and was informed that trapeze bar will be installed after surgery, d/t inability to transport patient with bar to sx.

## 2022-06-23 NOTE — Anesthesia Procedure Notes (Signed)
Procedure Name: Intubation Date/Time: 06/23/2022 1:40 PM  Performed by: Moshe Salisbury, CRNAPre-anesthesia Checklist: Patient identified, Emergency Drugs available, Suction available and Patient being monitored Patient Re-evaluated:Patient Re-evaluated prior to induction Oxygen Delivery Method: Circle System Utilized Preoxygenation: Pre-oxygenation with 100% oxygen Induction Type: IV induction Ventilation: Mask ventilation without difficulty Laryngoscope Size: Mac and 3 Grade View: Grade II Tube type: Oral Tube size: 7.0 mm Number of attempts: 1 Airway Equipment and Method: Stylet Placement Confirmation: ETT inserted through vocal cords under direct vision, positive ETCO2 and breath sounds checked- equal and bilateral Secured at: 22 cm Tube secured with: Tape Dental Injury: Teeth and Oropharynx as per pre-operative assessment

## 2022-06-23 NOTE — Progress Notes (Signed)
PROGRESS NOTE    [name]  GUR:[id number] DOB: Jul 14, 1965 DOA: 06/22/2022 PCP: Sharion Balloon, FNP   Brief Narrative: 57 year old with past medical history significant for tobacco abuse, osteoporosis, hypertension, hyperlipidemia, anxiety presented to the hospital after mechanical fall, she landed on her left side and was unable to bear weight. Patient was found to have acute subcapital left femoral neck fracture with valgus impaction.  Patient was transferred to [facility] for surgical intervention.   Assessment & Plan:   Principal Problem:   Closed left hip fracture (HCC) Active Problems:   HLD (hyperlipidemia)   Tobacco abuse   Anxiety with depression   Hypokalemia   Allergic rhinitis   1-Closed Left Hip Fracture: -X ray: Acute subcapital left femoral neck fracture with valgus impaction -Dr Lyla Glassing planning Sx today.   Hypokalemia: replaced.    Anxiety and depression Continue with Wellbutrin and Cymbalta   Tobacco abuse: Counseled Continue with nicotine patch  Hyperlipidemia: Continue with the statins  Allergic rhinitis: Continue with Flonase  Leukocytosis : Check UA         Estimated body mass index is 18.41 kg/m as calculated from the following:   Height as of this encounter: 5' 5.98" (1.676 m).   Weight as of this encounter: 51.7 kg.   DVT prophylaxis: per ortho Code Status: Full code Family Communication: Care discussed with patient.  Disposition Plan:  Status is: Inpatient Remains inpatient appropriate because: management hips sx    Consultants:  Dr Lyla Glassing  Procedures:    Antimicrobials:    Subjective: She denies chest pain, dyspnea.  Complaint of hip pain   Objective: Vitals:   06/23/22 0104 06/23/22 0105 06/23/22 0200 06/23/22 0351  BP: (!) 138/95  (!) 122/99 (!) 122/99  Pulse: 96 (!) 103 98 98  Resp: (!) '22 20 18 18  '$ Temp: 98.6 F (37 C)  98.4 F (36.9 C) 98.4 F (36.9 C)  TempSrc: Oral  Oral Oral   SpO2: 91% 90% 95%   Weight:    51.7 kg  Height:    5' 5.98" (1.676 m)   No intake or output data in the 24 hours ending 06/23/22 0720 Filed Weights   06/22/22 0410 06/23/22 0351  Weight: 51.8 kg 51.7 kg    Examination:  General exam: Appears calm and comfortable  Respiratory system: Clear to auscultation. Respiratory effort normal. Cardiovascular system: S1 & S2 heard, RRR. No JVD, murmurs, rubs, gallops or clicks. No pedal edema. Gastrointestinal system: Abdomen is nondistended, soft and nontender. No organomegaly or masses felt. Normal bowel sounds heard. Central nervous system: Alert and oriented. No focal neurological deficits. Extremities: left hip shorter  Data Reviewed: I have personally reviewed following labs and imaging studies  CBC: Recent Labs  Lab 06/22/22 0452 06/22/22 0946  WBC 12.7* 10.9*  NEUTROABS 9.4*  --   HGB 11.0* 12.5  HCT 33.3* 38.1  MCV 93.5 93.6  PLT 387 283*   Basic Metabolic Panel: Recent Labs  Lab 06/22/22 0452 06/22/22 0946  NA 137  --   K 3.1*  --   CL 103  --   CO2 28  --   GLUCOSE 109*  --   BUN 13  --   CREATININE 0.68 0.57  CALCIUM 8.4*  --   MG  --  1.9   GFR: Estimated Creatinine Clearance: 63.3 mL/min (by C-G formula based on SCr of 0.57 mg/dL). Liver Function Tests: No results for input(s): "AST", "ALT", "ALKPHOS", "BILITOT", "PROT", "ALBUMIN" in the last  168 hours. No results for input(s): "LIPASE", "AMYLASE" in the last 168 hours. No results for input(s): "AMMONIA" in the last 168 hours. Coagulation Profile: No results for input(s): "INR", "PROTIME" in the last 168 hours. Cardiac Enzymes: No results for input(s): "CKTOTAL", "CKMB", "CKMBINDEX", "TROPONINI" in the last 168 hours. BNP (last 3 results) No results for input(s): "PROBNP" in the last 8760 hours. HbA1C: No results for input(s): "HGBA1C" in the last 72 hours. CBG: No results for input(s): "GLUCAP" in the last 168 hours. Lipid Profile: No results for  input(s): "CHOL", "HDL", "LDLCALC", "TRIG", "CHOLHDL", "LDLDIRECT" in the last 72 hours. Thyroid Function Tests: No results for input(s): "TSH", "T4TOTAL", "FREET4", "T3FREE", "THYROIDAB" in the last 72 hours. Anemia Panel: No results for input(s): "VITAMINB12", "FOLATE", "FERRITIN", "TIBC", "IRON", "RETICCTPCT" in the last 72 hours. Sepsis Labs: No results for input(s): "PROCALCITON", "LATICACIDVEN" in the last 168 hours.  No results found for this or any previous visit (from the past 240 hour(s)).       Radiology Studies: DG Hip Unilat W or Wo Pelvis 2-3 Views Left  Result Date: 06/22/2022 CLINICAL DATA:  57 year old female status post fall.  Pain. EXAM: DG HIP (WITH OR WITHOUT PELVIS) 2-3V LEFT COMPARISON:  Lumbar radiographs 12/11/2015. FINDINGS: On the AP view of the pelvis dextroconvex lumbar scoliosis appears progressed since 2017. The patient is somewhat oblique to the right. Femoral heads remain normally located. Mild pubic symphysis irregularity might be remote trauma. No acute pelvis fracture identified. Grossly intact proximal right femur. Acute left femoral neck fracture with varus impaction. Subcapital fracture location. The intertrochanteric segment appears to remain intact. IMPRESSION: 1. Acute subcapital left femoral neck fracture with varus impaction. 2. No other acute osseous abnormality identified. Electronically Signed   By: Genevie Ann M.D.   On: 06/22/2022 04:45        Scheduled Meds:  buPROPion  150 mg Oral Daily   DULoxetine  60 mg Oral Daily   enoxaparin (LOVENOX) injection  40 mg Subcutaneous Q24H   fluticasone  1 spray Each Nare Daily   loratadine  10 mg Oral Daily   nicotine  14 mg Transdermal Daily   simvastatin  20 mg Oral q1800   Continuous Infusions:   LOS: 1 day    Time spent: 35 minutes.     Elmarie Shiley, MD [facility]   If 7PM-7AM, please contact night-coverage [email or web address]  06/23/2022, 7:20 AM

## 2022-06-23 NOTE — Op Note (Signed)
OPERATIVE REPORT  SURGEON: Rod Can, MD   ASSISTANT: Larene Pickett, PA-C  PREOPERATIVE DIAGNOSIS: Displaced Left femoral neck fracture.   POSTOPERATIVE DIAGNOSIS: Displaced Left femoral neck fracture.   PROCEDURE: Left total hip arthroplasty, anterior approach.   IMPLANTS: Biomet Taperloc Reduced Distal stem, size 13 x 146 mm, high offset. Biomet G7 OsseoTi Cup, size 48 mm. Biomet Vivacit-E liner, size 32 mm, C, neutral. Biomet Biolox ceramic head ball, size 32 - 3 mm.  ANESTHESIA:  General  ANTIBIOTICS: 2g ancef.  ESTIMATED BLOOD LOSS:-200 mL    DRAINS: None.  COMPLICATIONS: None   CONDITION: PACU - hemodynamically stable.   BRIEF CLINICAL NOTE: [name] is a 57 y.o. female with a displaced Left femoral neck fracture. The patient was admitted to the hospitalist service and underwent perioperative risk stratification and medical optimization. The risks, benefits, and alternatives to total hip arthroplasty were explained, and the patient elected to proceed.  PROCEDURE IN DETAIL: The patient was taken to the operating room and general anesthesia was induced on the hospital bed.  The patient was then positioned on the Hana table.  All bony prominences were well padded.  The hip was prepped and draped in the normal sterile surgical fashion.  A time-out was called verifying side and site of surgery. Antibiotics were given within 60 minutes of beginning the procedure.   Bikini incision was made, and the direct anterior approach to the hip was performed through the Hueter interval.  Lateral femoral circumflex vessels were treated with the Auqumantys. The anterior capsule was exposed and an inverted T capsulotomy was made.  Fracture hematoma was encountered and evacuated. The patient was found to have a comminuted Left subcapital femoral neck fracture.  I freshened the femoral neck cut with a saw.  I removed the femoral neck fragment.  A corkscrew was placed into the head and the  head was removed.  This was passed to the back table and was measured. The pubofemoral ligament was released subperiosteally to the lesser trochanter.  Acetabular exposure was achieved, and the pulvinar and labrum were excised. Sequential reaming of the acetabulum was then performed up to a size 47 mm reamer under direct visulization. A 48 mm cup was then opened and impacted into place at approximately 40 degrees of abduction and 20 degrees of anteversion. The final polyethylene liner was impacted into place and acetabular osteophytes were removed.    I then gained femoral exposure taking care to protect the abductors and greater trochanter.  This was performed using standard external rotation, extension, and adduction.  A cookie cutter was used to enter the femoral canal, and then the femoral canal finder was placed.  Sequential broaching was performed up to a size 13.  Calcar planer was used on the femoral neck remnant.  I placed a high offset neck and a trial head ball.  The hip was reduced.  Leg lengths and offset were checked fluoroscopically.  The hip was dislocated and trial components were removed.  The final implants were placed, and the hip was reduced.  Fluoroscopy was used to confirm component position and leg lengths.  At 90 degrees of external rotation and full extension, the hip was stable to an anterior directed force.   The wound was copiously irrigated with Irrisept solution and normal saline using pule lavage.  Marcaine solution was injected into the periarticular soft tissue.  The wound was closed in layers using #1 Stratafix for the fascia, 2-0 Vicryl for the subcutaneous fat, 2-0  Monocryl for the deep dermal layer, and staples + Dermabond for the skin.  Once the glue was fully dried, an Aquacell Ag dressing was applied.  The patient was transported to the recovery room in stable condition.  Sponge, needle, and instrument counts were correct at the end of the case x2.  The patient  tolerated the procedure well and there were no known complications.  Please note that a surgical assistant was a medical necessity for this procedure to perform it in a safe and expeditious manner. Assistant was necessary to provide appropriate retraction of vital neurovascular structures, to prevent femoral fracture, and to allow for anatomic placement of the prosthesis.

## 2022-06-23 NOTE — Anesthesia Preprocedure Evaluation (Signed)
Anesthesia Evaluation  Patient identified by MRN, date of birth, ID band Patient awake    Reviewed: Allergy & Precautions, NPO status , Patient's Chart, lab work & pertinent test results  History of Anesthesia Complications Negative for: history of anesthetic complications  Airway Mallampati: I  TM Distance: >3 FB Neck ROM: Full    Dental  (+) Edentulous Upper, Edentulous Lower, Dental Advisory Given   Pulmonary neg shortness of breath, neg COPD, neg recent URI, Current Smoker and Patient abstained from smoking.   breath sounds clear to auscultation       Cardiovascular hypertension, (-) angina (-) Past MI and (-) CHF  Rhythm:Regular     Neuro/Psych  PSYCHIATRIC DISORDERS Anxiety Depression    negative neurological ROS     GI/Hepatic negative GI ROS, Neg liver ROS,,,  Endo/Other    Renal/GU negative Renal ROS     Musculoskeletal  Left Hip fx   Abdominal   Peds  Hematology  (+) Blood dyscrasia, anemia Lab Results      Component                Value               Date                      WBC                      14.9 (H)            06/23/2022                HGB                      11.7 (L)            06/23/2022                HCT                      36.3                06/23/2022                MCV                      95.0                06/23/2022                PLT                      401 (H)             06/23/2022              Anesthesia Other Findings   Reproductive/Obstetrics                             Anesthesia Physical Anesthesia Plan  ASA: 3  Anesthesia Plan: General   Post-op Pain Management: Tylenol PO (pre-op)*   Induction: Intravenous  PONV Risk Score and Plan: 2 and Ondansetron and Dexamethasone  Airway Management Planned: Oral ETT  Additional Equipment: None  Intra-op Plan:   Post-operative Plan: Extubation in OR  Informed Consent: I have reviewed the  patients History and Physical, chart, labs and discussed the procedure including the risks, benefits  and alternatives for the proposed anesthesia with the patient or authorized representative who has indicated his/her understanding and acceptance.     Dental advisory given  Plan Discussed with: CRNA  Anesthesia Plan Comments:        Anesthesia Quick Evaluation

## 2022-06-23 NOTE — Anesthesia Postprocedure Evaluation (Signed)
Anesthesia Post Note  Patient: [name]  Procedure(s) Performed: TOTAL HIP ARTHROPLASTY ANTERIOR APPROACH (Left: Hip)     Patient location during evaluation: PACU Anesthesia Type: General Level of consciousness: awake and patient cooperative Pain management: pain level controlled Vital Signs Assessment: post-procedure vital signs reviewed and stable Respiratory status: spontaneous breathing, nonlabored ventilation, respiratory function stable and patient connected to nasal cannula oxygen Cardiovascular status: blood pressure returned to baseline and stable Postop Assessment: no apparent nausea or vomiting Anesthetic complications: no  No notable events documented.  Last Vitals:  Vitals:   06/23/22 1632 06/23/22 2009  BP: 117/88 111/76  Pulse: 93 99  Resp: 18 15  Temp: 36.9 C 36.8 C  SpO2: 98% 98%    Last Pain:  Vitals:   06/23/22 2009  TempSrc: Oral  PainSc:                  Arlyn Bumpus

## 2022-06-23 NOTE — Consult Note (Addendum)
ORTHOPAEDIC CONSULTATION  REQUESTING PHYSICIAN: Elmarie Shiley, MD  PCP:  Sharion Balloon, FNP  Chief Complaint: Left hip injury  HPI: [name] is a 57 y.o. female with a past medical history of tobacco abuse,osteoporosis, hypertension, hyperlipidemia, and anxiety who sustained a ground-level fall yesterday.  She tripped over her grandchild's toy, and landed on her left hip.  She had immediate left hip pain and inability to weight-bear.  She was brought to the emergency department at [facility], where x-rays revealed a displaced left femoral neck fracture.  Orthopedic surgery consult was placed to Dr. Lucia Gaskins who accepted the patient in transfer to [facility].  I was then asked to take over care due to my expertise in adult reconstruction.  She was admitted by the hospitalist service for perioperative restratification and medical optimization.  She denies other injuries.  Past Medical History:  Diagnosis Date   Anemia    Anxiety    Hyperlipidemia    Hypertension    Osteoporosis    Past Surgical History:  Procedure Laterality Date   ABDOMINAL HYSTERECTOMY     Partial   HARDWARE REMOVAL Left 06/20/2015   Procedure: HARDWARE REMOVAL;  Surgeon: Carole Civil, MD;  Location: [facility];  Service: Orthopedics;  Laterality: Left;  left knee   ORIF PATELLA Left 12/29/2014   Procedure: OPEN REDUCTION INTERNAL FIXATION LEFT PATELLA;  Surgeon: Carole Civil, MD;  Location: [facility];  Service: Orthopedics;  Laterality: Left;   Social History   Socioeconomic History   Marital status: Divorced    Spouse name: Not on file   Number of children: Not on file   Years of education: Not on file   Highest education level: Not on file  Occupational History   Not on file  Tobacco Use   Smoking status: Every Day    Packs/day: 1.00    Years: 29.00    Total pack years: 29.00    Types: Cigarettes   Smokeless tobacco: Never  Vaping Use   Vaping Use: Never used  Substance and  Sexual Activity   Alcohol use: No    Alcohol/week: 0.0 standard drinks of alcohol   Drug use: No   Sexual activity: Not on file  Other Topics Concern   Not on file  Social History Narrative   Not on file   Social Determinants of Health   Financial Resource Strain: Not on file  Food Insecurity: No Food Insecurity (06/23/2022)   Hunger Vital Sign    Worried About Running Out of Food in the Last Year: Never true    Ran Out of Food in the Last Year: Never true  Transportation Needs: No Transportation Needs (06/23/2022)   PRAPARE - Hydrologist (Medical): No    Lack of Transportation (Non-Medical): No  Physical Activity: Not on file  Stress: Not on file  Social Connections: Not on file   Family History  Problem Relation Age of Onset   COPD Mother    Osteoporosis Mother    COPD Father    Osteoporosis Father    Colon cancer Neg Hx    Breast cancer Neg Hx    Allergies  Allergen Reactions   Codeine Other (See Comments)    Makes pt.have seizure   Hydrocodone     May have caused a seizure per patient reported.   Prior to Admission medications   Medication Sig Start Date End Date Taking? Authorizing Provider  alendronate (FOSAMAX) 70  MG tablet TAKE 1 TABLET WITH A FULL GLASS OF WATER ON AN EMPTY STOMACH ONCE A WEEK 04/17/22  Yes Hawks, Christy A, FNP  buPROPion (WELLBUTRIN XL) 150 MG 24 hr tablet Take 1 tablet (150 mg total) by mouth daily. 09/19/21  Yes Hawks, Christy A, FNP  calcium-vitamin D (OSCAL WITH D) 500-200 MG-UNIT tablet Take 1 tablet by mouth daily. 04/23/16  Yes Eckard, Tammy, RPH-CPP  cyclobenzaprine (FLEXERIL) 5 MG tablet TAKE 1 TABLET BY MOUTH 2 TIMES DAILY AS NEEDED FOR MUSCLE SPASMS. Patient taking differently: Take 5 mg by mouth 2 (two) times daily as needed for muscle spasms. 02/21/22  Yes Hawks, Christy A, FNP  diphenhydrAMINE (BENADRYL) 25 mg capsule Take 25 mg by mouth daily.   Yes [provider]  docusate sodium (COLACE) 100  MG capsule Take 100 mg by mouth daily.   Yes [provider]  DULoxetine (CYMBALTA) 60 MG capsule Take 1 capsule (60 mg total) by mouth daily. 09/19/21  Yes Hawks, Christy A, FNP  fexofenadine (ALLEGRA) 180 MG tablet Take 180 mg by mouth daily. OTC   Yes [provider]  fluticasone (FLONASE) 50 MCG/ACT nasal spray SPRAY 2 SPRAYS INTO EACH NOSTRIL EVERY DAY Patient taking differently: Place 2 sprays into both nostrils daily. SPRAY 2 SPRAYS INTO EACH NOSTRIL EVERY DAY 04/17/22  Yes Hawks, Christy A, FNP  hydrOXYzine (VISTARIL) 25 MG capsule TAKE 1 CAPSULE BY MOUTH THREE TIMES A DAY AS NEEDED Patient taking differently: Take 25 mg by mouth 3 (three) times daily as needed for anxiety or itching. 01/17/22  Yes Hawks, Christy A, FNP  ibuprofen (ADVIL) 600 MG tablet TAKE 1 TABLET BY MOUTH EVERY 8 HOURS AS NEEDED 04/14/22  Yes Hawks, Christy A, FNP  Simethicone (GAS-X EXTRA STRENGTH) 125 MG CAPS Take 1 capsule (125 mg total) by mouth daily. 04/21/17  Yes Hawks, Alyse Low A, FNP  simvastatin (ZOCOR) 20 MG tablet TAKE 1 TABLET BY MOUTH DAILY AT 6 PM. 09/19/21  Yes Sharion Balloon, FNP   DG Knee Left Port  Result Date: 06/23/2022 CLINICAL DATA:  Proximal femur fracture.  Evaluate knee. EXAM: PORTABLE LEFT KNEE - 2 VIEW COMPARISON:  03/22/2015 radiographs FINDINGS: There is no evidence of acute fracture subluxation or dislocation. No joint effusion is noted. Fixation hardware within the patella again noted. IMPRESSION: 1. No evidence of acute abnormality. Electronically Signed   By: Margarette Canada M.D.   On: 06/23/2022 09:48   DG Hip Unilat W or Wo Pelvis 2-3 Views Left  Result Date: 06/22/2022 CLINICAL DATA:  57 year old female status post fall.  Pain. EXAM: DG HIP (WITH OR WITHOUT PELVIS) 2-3V LEFT COMPARISON:  Lumbar radiographs 12/11/2015. FINDINGS: On the [facility] view of the pelvis dextroconvex lumbar scoliosis appears progressed since 2017. The patient is somewhat oblique to the right. Femoral  heads remain normally located. Mild pubic symphysis irregularity might be remote trauma. No acute pelvis fracture identified. Grossly intact proximal right femur. Acute left femoral neck fracture with varus impaction. Subcapital fracture location. The intertrochanteric segment appears to remain intact. IMPRESSION: 1. Acute subcapital left femoral neck fracture with varus impaction. 2. No other acute osseous abnormality identified. Electronically Signed   By: Genevie Ann M.D.   On: 06/22/2022 04:45    Positive ROS: All other systems have been reviewed and were otherwise negative with the exception of those mentioned in the HPI and as above.  Physical Exam: General: Alert, no acute distress Cardiovascular: No pedal edema Respiratory: No cyanosis, no use of  accessory musculature GI: No organomegaly, abdomen is soft and non-tender Skin: No lesions in the area of chief complaint Neurologic: Sensation intact distally Psychiatric: Patient is competent for consent with normal mood and affect Lymphatic: No axillary or cervical lymphadenopathy  MUSCULOSKELETAL: Examination of the left hip reveals no skin wounds or lesions.  She is shortened and externally rotated.  She has pain with attempted logrolling.  Distally, there is no focal motor or sensory deficit.  She has palpable pedal pulses.  Assessment: Displaced left femoral neck fracture. Tobacco abuse. Osteoporosis on alendronate.  Plan: I discussed the findings with the patient.  She has an unstable, displaced left femoral neck fracture that requires surgical treatment.  I recommend left total hip replacement for pain control and immediate mobilization out of bed.  We discussed the risks, benefits, and alternatives to anterior approach left total hip arthroplasty.  Please see statement of risk.  We will proceed with surgery today.  Continue NPO.  Hold chemical DVT prophylaxis.  Patient will need to discontinue alendronate for at least 6 months  postoperatively in order to allow for adequate osseous integration into her hip replacement device.  Recommend tobacco cessation in order to decrease the risk of wound healing complications, the most devastating of which is periprosthetic joint infection.  All questions were solicited and answered.  The risks, benefits, and alternatives were discussed with the patient. There are risks associated with the surgery including, but not limited to, problems with anesthesia (death), infection, instability (giving out of the joint), dislocation, differences in leg length/angulation/rotation, fracture of bones, loosening or failure of implants, hematoma (blood accumulation) which may require surgical drainage, blood clots, pulmonary embolism, nerve injury (foot drop and lateral thigh numbness), and blood vessel injury. The patient understands these risks and elects to proceed.   Bertram Savin, MD [phone]    06/23/2022 10:40 AM   ADDENDUM: Patient reports eating crackers in the ED at 0730. Therefore, surgery will need to be delayed until 1330. 11:36 AM

## 2022-06-24 ENCOUNTER — Other Ambulatory Visit: Payer: Self-pay | Admitting: Family

## 2022-06-24 ENCOUNTER — Encounter (HOSPITAL_COMMUNITY): Payer: Self-pay | Admitting: Orthopedic Surgery

## 2022-06-24 ENCOUNTER — Inpatient Hospital Stay (HOSPITAL_COMMUNITY): Payer: Medicaid Other

## 2022-06-24 DIAGNOSIS — F411 Generalized anxiety disorder: Secondary | ICD-10-CM

## 2022-06-24 DIAGNOSIS — S72002A Fracture of unspecified part of neck of left femur, initial encounter for closed fracture: Secondary | ICD-10-CM | POA: Diagnosis not present

## 2022-06-24 LAB — CBC
HCT: 26.2 % — ABNORMAL LOW (ref 36.0–46.0)
Hemoglobin: 8.5 g/dL — ABNORMAL LOW (ref 12.0–15.0)
MCH: 30.7 pg (ref 26.0–34.0)
MCHC: 32.4 g/dL (ref 30.0–36.0)
MCV: 94.6 fL (ref 80.0–100.0)
Platelets: 301 10*3/uL (ref 150–400)
RBC: 2.77 MIL/uL — ABNORMAL LOW (ref 3.87–5.11)
RDW: 12.7 % (ref 11.5–15.5)
WBC: 17.3 10*3/uL — ABNORMAL HIGH (ref 4.0–10.5)
nRBC: 0 % (ref 0.0–0.2)

## 2022-06-24 LAB — URINE CULTURE: Culture: NO GROWTH

## 2022-06-24 LAB — BASIC METABOLIC PANEL
Anion gap: 10 (ref 5–15)
BUN: 7 mg/dL (ref 6–20)
CO2: 27 mmol/L (ref 22–32)
Calcium: 8.2 mg/dL — ABNORMAL LOW (ref 8.9–10.3)
Chloride: 99 mmol/L (ref 98–111)
Creatinine, Ser: 0.61 mg/dL (ref 0.44–1.00)
GFR, Estimated: >60 mL/min (ref >60)
Glucose, Bld: 130 mg/dL — ABNORMAL HIGH (ref 70–99)
Potassium: 3.7 mmol/L (ref 3.5–5.1)
Sodium: 136 mmol/L (ref 135–145)

## 2022-06-24 MED ORDER — POLYETHYLENE GLYCOL 3350 17 G PO PACK
17.0000 g | PACK | Freq: Every day | ORAL | Status: DC
  Administered 2022-06-25: 17 g via ORAL
  Filled 2022-06-24 (×2): qty 1

## 2022-06-24 MED ORDER — SODIUM CHLORIDE 0.9 % IV SOLN
1.0000 g | INTRAVENOUS | Status: DC
  Administered 2022-06-24 – 2022-06-25 (×2): 1 g via INTRAVENOUS
  Filled 2022-06-24 (×2): qty 10

## 2022-06-24 MED ORDER — FOLIC ACID 1 MG PO TABS
1.0000 mg | ORAL_TABLET | Freq: Every day | ORAL | Status: DC
  Administered 2022-06-24 – 2022-06-25 (×2): 1 mg via ORAL
  Filled 2022-06-24 (×2): qty 1

## 2022-06-24 MED ORDER — FERROUS SULFATE 325 (65 FE) MG PO TABS
325.0000 mg | ORAL_TABLET | Freq: Every day | ORAL | Status: DC
  Administered 2022-06-24 – 2022-06-25 (×2): 325 mg via ORAL
  Filled 2022-06-24 (×2): qty 1

## 2022-06-24 MED ORDER — MAGNESIUM SULFATE 2 GM/50ML IV SOLN
2.0000 g | Freq: Once | INTRAVENOUS | Status: AC
  Administered 2022-06-24: 2 g via INTRAVENOUS
  Filled 2022-06-24: qty 50

## 2022-06-24 MED ORDER — AZITHROMYCIN 250 MG PO TABS
500.0000 mg | ORAL_TABLET | Freq: Every day | ORAL | Status: DC
  Administered 2022-06-24 – 2022-06-25 (×2): 500 mg via ORAL
  Filled 2022-06-24 (×2): qty 2

## 2022-06-24 MED ORDER — SODIUM CHLORIDE 0.9 % IV SOLN: 1.0000 g | INTRAVENOUS | Status: DC

## 2022-06-24 NOTE — Progress Notes (Signed)
    Subjective:  Patient reports pain as mild.  Denies N/V/CP/SOB/Abd pain. She denies any tingling or numbness in LE bilaterally. Reports pain as mild. She did not require much pain medication overnight.   Objective:   VITALS:   Vitals:   06/23/22 1615 06/23/22 1632 06/23/22 2009 06/24/22 0437  BP: 116/69 117/88 111/76 114/80  Pulse: 91 93 99 92  Resp: _0 Temp: 98.3 F (36.8 C) 98.4 F (36.9 C) 98.3 F (36.8 C) 98.3 F (36.8 C)  TempSrc:   Oral   SpO2: 94% 98% 98% 100%  Weight:      Height:        Patient is sitting up in bed. NAD. Neurologically intact ABD soft Neurovascular intact Sensation intact distally Intact pulses distally Dorsiflexion/Plantar flexion intact Incision: dressing C/D/I No cellulitis present Compartment soft Small subcutaneous fluid collection.   Lab Results  Component Value Date   WBC 17.3 (H) 06/24/2022   HGB 8.5 (L) 06/24/2022   HCT 26.2 (L) 06/24/2022   MCV 94.6 06/24/2022   PLT 301 06/24/2022   BMET    Component Value Date/Time   NA 136 06/24/2022 0416   NA 137 03/25/2022 1502   K 3.7 06/24/2022 0416   CL 99 06/24/2022 0416   CO2 27 06/24/2022 0416   GLUCOSE 130 (H) 06/24/2022 0416   BUN 7 06/24/2022 0416   BUN 8 03/25/2022 1502   CREATININE 0.61 06/24/2022 0416   CALCIUM 8.2 (L) 06/24/2022 0416   EGFR 96 03/25/2022 1502   GFRNONAA >60 06/24/2022 0416     Assessment/Plan: 1 Day Post-Op   Principal Problem:   Closed left hip fracture (HCC) Active Problems:   HLD (hyperlipidemia)   Tobacco abuse   Anxiety with depression   Hypokalemia   Allergic rhinitis   WBAT with walker DVT ppx: Aspirin, SCDs, TEDS PO pain control PT/OT: PT has not seen yet. PT to come by today.  Dispo: Patient under care of the medical team, disposition per their recommendation. Patient hopeful to go home with her daughter.    Charlott Rakes, PA-C 06/24/2022, 7:34 AM   [facility]  [place] [address], [address],  [place], [place] [address] Phone: [phone] [email or web address] Facebook  [employer]

## 2022-06-24 NOTE — Progress Notes (Signed)
PROGRESS NOTE    [name]  ERX:[id number] DOB: 21-Sep-1965 DOA: 06/22/2022 PCP: Sharion Balloon, FNP   Brief Narrative: 57 year old with past medical history significant for tobacco abuse, osteoporosis, hypertension, hyperlipidemia, anxiety presented to the hospital after mechanical fall, she landed on her left side and was unable to bear weight. Patient was found to have acute subcapital left femoral neck fracture with valgus impaction.  Patient was transferred to [facility] for surgical intervention.   Assessment & Plan:   Principal Problem:   Closed left hip fracture (HCC) Active Problems:   HLD (hyperlipidemia)   Tobacco abuse   Anxiety with depression   Hypokalemia   Allergic rhinitis   1-Closed displaced  Left Femoral neck Fracture: -X ray: Acute subcapital left femoral neck fracture with valgus impaction -Underwent total left hip arthroplasty by Dr Lyla Glassing 06/23/2022. -Continue with bowel regimen.  -PT/OT consulted.  -DVT prophylaxis; aspirin.   Leukocytosis :  UA negative. Follow uirne culture.  Suspect PNA. She report cough, greenish sputum for few days.  Plan to start empirically IV ceftriaxone and Azithromycin.  Check Chest x ray.   Hypokalemia: Replaced.   Acute Blood loss anemia; post sx. Expected. Monitor hb.   Anxiety and depression Continue with Wellbutrin and Cymbalta 11---8.5 Follow trend  Tobacco abuse: Counseled Continue with nicotine patch  Hyperlipidemia: Continue with the statins  Allergic rhinitis: Continue with Flonase         Estimated body mass index is 18.41 kg/m as calculated from the following:   Height as of this encounter: 5' 5.98" (1.676 m).   Weight as of this encounter: 51.7 kg.   DVT prophylaxis: per ortho Code Status: Full code Family Communication: Care discussed with patient.  Disposition Plan:  Status is: Inpatient Remains inpatient appropriate because: management hips sx    Consultants:  Dr  Lyla Glassing  Procedures:    Antimicrobials:    Subjective: She is feeling better, left hip pain controlled. NO B She denies dysuria.  She report productive cough, greenish sputum.    Objective: Vitals:   06/23/22 1615 06/23/22 1632 06/23/22 2009 06/24/22 0437  BP: 116/69 117/88 111/76 114/80  Pulse: 91 93 99 92  Resp: '13 18 15 16  '$ Temp: 98.3 F (36.8 C) 98.4 F (36.9 C) 98.3 F (36.8 C) 98.3 F (36.8 C)  TempSrc:   Oral   SpO2: 94% 98% 98% 100%  Weight:      Height:        Intake/Output Summary (Last 24 hours) at 06/24/2022 0711 Last data filed at 06/24/2022 0453 Gross per 24 hour  Intake 2815.54 ml  Output 215 ml  Net 2600.54 ml   Filed Weights   06/22/22 0410 06/23/22 0351  Weight: 51.8 kg 51.7 kg    Examination:  General exam: NAD Respiratory system: CTA Cardiovascular system: S 1, S 2 RRR Gastrointestinal system: BS present, soft, nt Central nervous system: Non focal.  Extremities:Left Hip with dressing.   Data Reviewed: I have personally reviewed following labs and imaging studies  CBC: Recent Labs  Lab 06/22/22 0452 06/22/22 0946 06/23/22 0753 06/24/22 0416  WBC 12.7* 10.9* 14.9* 17.3*  NEUTROABS 9.4*  --   --   --   HGB 11.0* 12.5 11.7* 8.5*  HCT 33.3* 38.1 36.3 26.2*  MCV 93.5 93.6 95.0 94.6  PLT 387 425* 401* 950    Basic Metabolic Panel: Recent Labs  Lab 06/22/22 0452 06/22/22 0946 06/23/22 0753 06/24/22 0416  NA 137  --  134*  136  K 3.1*  --  3.5 3.7  CL 103  --  100 99  CO2 28  --  28 27  GLUCOSE 109*  --  112* 130*  BUN 13  --  8 7  CREATININE 0.68 0.57 0.47 0.61  CALCIUM 8.4*  --  8.4* 8.2*  MG  --  1.9 1.7  --     GFR: Estimated Creatinine Clearance: 63.3 mL/min (by C-G formula based on SCr of 0.61 mg/dL). Liver Function Tests: No results for input(s): "AST", "ALT", "ALKPHOS", "BILITOT", "PROT", "ALBUMIN" in the last 168 hours. No results for input(s): "LIPASE", "AMYLASE" in the last 168 hours. No results for  input(s): "AMMONIA" in the last 168 hours. Coagulation Profile: No results for input(s): "INR", "PROTIME" in the last 168 hours. Cardiac Enzymes: No results for input(s): "CKTOTAL", "CKMB", "CKMBINDEX", "TROPONINI" in the last 168 hours. BNP (last 3 results) No results for input(s): "PROBNP" in the last 8760 hours. HbA1C: No results for input(s): "HGBA1C" in the last 72 hours. CBG: No results for input(s): "GLUCAP" in the last 168 hours. Lipid Profile: No results for input(s): "CHOL", "HDL", "LDLCALC", "TRIG", "CHOLHDL", "LDLDIRECT" in the last 72 hours. Thyroid Function Tests: No results for input(s): "TSH", "T4TOTAL", "FREET4", "T3FREE", "THYROIDAB" in the last 72 hours. Anemia Panel: No results for input(s): "VITAMINB12", "FOLATE", "FERRITIN", "TIBC", "IRON", "RETICCTPCT" in the last 72 hours. Sepsis Labs: No results for input(s): "PROCALCITON", "LATICACIDVEN" in the last 168 hours.  Recent Results (from the past 240 hour(s))  Surgical pcr screen     Status: Abnormal   Collection Time: 06/23/22 10:23 AM   Specimen: Nasal Mucosa; Nasal Swab  Result Value Ref Range Status   MRSA, PCR NEGATIVE NEGATIVE Final   Staphylococcus aureus POSITIVE (A) NEGATIVE Final    Comment: (NOTE) The Xpert SA Assay (FDA approved for NASAL specimens in patients 63 years of age and older), is one component of a comprehensive surveillance program. It is not intended to diagnose infection nor to guide or monitor treatment. Performed at [facility], [place] [address], [place], [place] [address]   Urine Culture     Status: None (Preliminary result)   Collection Time: 06/23/22  3:28 PM   Specimen: Urine, In & Out Cath  Result Value Ref Range Status   Specimen Description IN/OUT CATH URINE  Final   Special Requests   Final    NONE Performed at [facility], [place] [address], [place],  [address]    Culture PENDING  Incomplete   Report Status PENDING  Incomplete          Radiology Studies: DG HIP UNILAT WITH PELVIS 1V LEFT  Result Date: 06/23/2022 CLINICAL DATA:  Intraoperative fluoroscopy for total left hip arthroplasty. EXAM: DG HIP (WITH OR WITHOUT PELVIS) 1V*L* COMPARISON:  Pelvis and left hip radiographs 06/22/2022 FINDINGS: Images were performed intraoperatively without the presence of a radiologist. New total left hip arthroplasty. No hardware complication is seen. Total fluoroscopy images: Total fluoroscopy time: 17 seconds Total dose: Radiation Exposure Index (as provided by the fluoroscopic device): 1.14 mGy air Kerma Please see intraoperative findings for further detail. IMPRESSION: Intraoperative fluoroscopy for total left hip arthroplasty. Electronically Signed   By: Yvonne Kendall M.D.   On: 06/23/2022 15:22   DG C-Arm 1-60 Min-No Report  Result Date: 06/23/2022 Fluoroscopy was utilized by the requesting physician.  No radiographic interpretation.   DG C-Arm 1-60 Min-No Report  Result Date: 06/23/2022 Fluoroscopy was utilized by the  requesting physician.  No radiographic interpretation.   DG Knee Left Port  Result Date: 06/23/2022 CLINICAL DATA:  Proximal femur fracture.  Evaluate knee. EXAM: PORTABLE LEFT KNEE - 2 VIEW COMPARISON:  03/22/2015 radiographs FINDINGS: There is no evidence of acute fracture subluxation or dislocation. No joint effusion is noted. Fixation hardware within the patella again noted. IMPRESSION: 1. No evidence of acute abnormality. Electronically Signed   By: Margarette Canada M.D.   On: 06/23/2022 09:48        Scheduled Meds:  aspirin  81 mg Oral BID   buPROPion  150 mg Oral Daily   celecoxib  200 mg Oral BID   Chlorhexidine Gluconate Cloth  6 each Topical Q0600   docusate sodium  100 mg Oral BID   DULoxetine  60 mg Oral Daily   fluticasone  1 spray Each Nare Daily   loratadine  10 mg Oral Daily   mupirocin ointment  1 Application Nasal BID   nicotine  14 mg Transdermal Daily   senna  1 tablet Oral BID    simvastatin  20 mg Oral q1800   Continuous Infusions:  sodium chloride     methocarbamol (ROBAXIN) IV       LOS: 2 days    Time spent: 35 minutes.     Elmarie Shiley, MD [facility]   If 7PM-7AM, please contact night-coverage [email or web address]  06/24/2022, 7:11 AM

## 2022-06-24 NOTE — Evaluation (Addendum)
Physical Therapy Evaluation  Patient Details Name: [name] MRN: [id number] DOB: 1965/09/24 Today's Date: 06/24/2022  History of Present Illness  Pt is a 57 y/o female who presents s/p fall at home, sustaining a L femoral neck fracture. She is now s/p L THA direct anterior approach on 06/23/2022. PMH significant for Anxiety, HTN, osteoporosis, L patellar ORIF 2016.   Clinical Impression  Pt is s/p THA resulting in the deficits listed below (see PT Problem List). At the time of PT eval pt was able to perform transfers and ambulation with gross min guard assist and RW for support. Pt anticipates d/c home with daughter's support who is available 24/7 per pt report. She was motivated for activity and ambulation distance this session and was able to tolerate initiation of HEP as well. Pt will benefit from skilled PT to increase their independence and safety with mobility to allow discharge to the venue listed below.         Recommendations for follow up therapy are one component of a multi-disciplinary discharge planning process, led by the attending physician.  Recommendations may be updated based on patient status, additional functional criteria and insurance authorization.  Follow Up Recommendations Outpatient PT      Assistance Recommended at Discharge PRN  Patient can return home with the following  A little help with walking and/or transfers;A little help with bathing/dressing/bathroom;Assistance with cooking/housework;Assist for transportation;Help with stairs or ramp for entrance    Equipment Recommendations Rolling walker (2 wheels)  Recommendations for Other Services       Functional Status Assessment Patient has had a recent decline in their functional status and demonstrates the ability to make significant improvements in function in a reasonable and predictable amount of time.     Precautions / Restrictions Precautions Precautions: Fall Restrictions Weight Bearing  Restrictions: Yes LLE Weight Bearing: Weight bearing as tolerated      Mobility  Bed Mobility               General bed mobility comments: Pt was received sitting up in the recliner    Transfers Overall transfer level: Needs assistance Equipment used: Rolling walker (2 wheels) Transfers: Sit to/from Stand Sit to Stand: Min guard           General transfer comment: VC's for hand placement on seated surface for safety as pt powered up to full stand. Increased time required. Noted mild uncontrolled descent to chair at end of session.    Ambulation/Gait Ambulation/Gait assistance: Min guard Gait Distance (Feet): 200 Feet Assistive device: Rolling walker (2 wheels) Gait Pattern/deviations: Step-through pattern, Decreased stride length, Trunk flexed Gait velocity: Decreased Gait velocity interpretation: <1.31 ft/sec, indicative of household ambulator   General Gait Details: VC's for improved posture, closer walker proximity, and forward gaze. Pt was able to improve gait pattern with increased heel strike and step-through pattern with cues as well.  Stairs            Wheelchair Mobility    Modified Rankin (Stroke Patients Only)       Balance Overall balance assessment: Needs assistance Sitting-balance support: No upper extremity supported, Feet supported Sitting balance-Leahy Scale: Fair     Standing balance support: No upper extremity supported Standing balance-Leahy Scale: Fair Standing balance comment: Statically. Requires UE support with dynamic movement.                             Pertinent Vitals/Pain Pain Assessment  Pain Assessment: Faces Faces Pain Scale: Hurts a little bit Pain Location: L hip Pain Descriptors / Indicators: Operative site guarding Pain Intervention(s): Limited activity within patient's tolerance, Monitored during session, Repositioned    Home Living Family/patient expects to be discharged to:: Private  residence Living Arrangements: Children Available Help at Discharge: Family;Available 24 hours/day Type of Home: House Home Access: Level entry       Home Layout: One level Home Equipment: Cane - single point      Prior Function Prior Level of Function : Independent/Modified Independent (Does not drive, does not work)                     Journalist, newspaper        Extremity/Trunk Assessment   Upper Extremity Assessment Upper Extremity Assessment: Overall WFL for tasks assessed    Lower Extremity Assessment Lower Extremity Assessment: LLE deficits/detail LLE Deficits / Details: Acute pain, decreased strength and AROM consistent with pre-op diagnosis and sunsequent surgery.    Cervical / Trunk Assessment Cervical / Trunk Assessment: Normal (Forward head posture with rounded shoulders)  Communication   Communication: No difficulties  Cognition Arousal/Alertness: Awake/alert Behavior During Therapy: WFL for tasks assessed/performed Overall Cognitive Status: Within Functional Limits for tasks assessed                                          General Comments      Exercises Total Joint Exercises Quad Sets: 10 reps Hip ABduction/ADduction: 10 reps Long Arc Quad: 10 reps   Assessment/Plan    PT Assessment Patient needs continued PT services  PT Problem List Decreased strength;Decreased range of motion;Decreased activity tolerance;Decreased balance;Decreased mobility;Decreased knowledge of use of DME;Decreased safety awareness;Decreased knowledge of precautions;Pain       PT Treatment Interventions DME instruction;Gait training;Stair training;Functional mobility training;Therapeutic activities;Therapeutic exercise;Balance training;Patient/family education    PT Goals (Current goals can be found in the Care Plan section)  Acute Rehab PT Goals Patient Stated Goal: Be able to manage at home PT Goal Formulation: With patient Time For Goal  Achievement: 07/01/22 Potential to Achieve Goals: Good    Frequency Min 5X/week     Co-evaluation               AM-PAC PT "6 Clicks" Mobility  Outcome Measure Help needed turning from your back to your side while in a flat bed without using bedrails?: None Help needed moving from lying on your back to sitting on the side of a flat bed without using bedrails?: A Little Help needed moving to and from a bed to a chair (including a wheelchair)?: A Little Help needed standing up from a chair using your arms (e.g., wheelchair or bedside chair)?: A Little Help needed to walk in hospital room?: A Little Help needed climbing 3-5 steps with a railing? : A Little 6 Click Score: 19    End of Session Equipment Utilized During Treatment: Gait belt Activity Tolerance: Patient tolerated treatment well Patient left: in chair;with call bell/phone within reach;with chair alarm set Nurse Communication: Mobility status PT Visit Diagnosis: Unsteadiness on feet (R26.81);Pain Pain - Right/Left: Left Pain - part of body: Hip    Time: 0912-0937 PT Time Calculation (min) (ACUTE ONLY): 25 min   Charges:   PT Evaluation $PT Eval Moderate Complexity: 1 Mod PT Treatments $Gait Training: 8-22 mins  Rolinda Roan, PT, DPT Acute Rehabilitation Services Secure Chat Preferred Office: [phone]   Thelma Comp 06/24/2022, 10:41 AM

## 2022-06-24 NOTE — Evaluation (Signed)
Occupational Therapy Evaluation Patient Details Name: [name] MRN: [id number] DOB: 1966/01/23 Today's Date: 06/24/2022   History of Present Illness Pt is a 57 y/o female who presents s/p fall at home, sustaining a L femoral neck fracture. She is now s/p L THA direct anterior approach on 06/23/2022. PMH significant for Anxiety, HTN, osteoporosis, L patellar ORIF 2016.   Clinical Impression   Pt in bathroom upon therapy and agreeable to participate in OT evaluation. Prior to admit, pt was independent with all BADL and IADL tasks. Currently, pt is requiring slight increased physical assist with primarily LB bathing/dressing and functional mobility due to decreased dynamic standing balance from recent left hip surgery. Pt reports that she will have assistance at home from Daughter when discharged. Pt provided with education regarding AE that may assist with LB ADL tasks and provided handout for reference. Pt verbalized understanding. No follow up OT services needed at this time; will sign off.       Recommendations for follow up therapy are one component of a multi-disciplinary discharge planning process, led by the attending physician.  Recommendations may be updated based on patient status, additional functional criteria and insurance authorization.   Follow Up Recommendations  No OT follow up     Assistance Recommended at Discharge PRN  Patient can return home with the following A little help with bathing/dressing/bathroom;Help with stairs or ramp for entrance;Assistance with cooking/housework;Assist for transportation    Functional Status Assessment  Patient has had a recent decline in their functional status and demonstrates the ability to make significant improvements in function in a reasonable and predictable amount of time.  Equipment Recommendations  Tub/shower seat       Precautions / Restrictions Precautions Precautions: Fall Restrictions Weight Bearing Restrictions:  Yes LLE Weight Bearing: Weight bearing as tolerated      Mobility Bed Mobility Overal bed mobility:  (Pt in bathroom upon therapy arrival)               Patient Response: Cooperative  Transfers Overall transfer level: Needs assistance Equipment used: Rolling walker (2 wheels) Transfers: Sit to/from Stand, Bed to chair/wheelchair/BSC Sit to Stand: Supervision     Step pivot transfers: Supervision     General transfer comment: VC for safety awareness and RW management when performing functional mobility from bathroom to recliner.      Balance Overall balance assessment: Needs assistance Sitting-balance support: No upper extremity supported, Feet supported Sitting balance-Leahy Scale: Good     Standing balance support: No upper extremity supported, During functional activity Standing balance-Leahy Scale: Fair Standing balance comment: While washing hands at sink         ADL either performed or assessed with clinical judgement   ADL Overall ADL's : Needs assistance/impaired Eating/Feeding: Sitting;Modified independent   Grooming: Wash/dry hands;Wash/dry face;Oral care;Standing;Supervision/safety   Upper Body Bathing: Set up;Sitting   Lower Body Bathing: Moderate assistance;Sit to/from stand;Sitting/lateral leans   Upper Body Dressing : Set up;Sitting   Lower Body Dressing: Moderate assistance;Sit to/from stand;Sitting/lateral leans   Toilet Transfer: Supervision/safety;Rolling walker (2 wheels);Regular Toilet   Toileting- Clothing Manipulation and Hygiene: Modified independent;Sit to/from stand;Sitting/lateral lean         General ADL Comments: Provided pt education regarding use of AE such as reacher and long handled sponge to assist with LB bathing and dressing tasks as well as increase safety while completing self care tasks. Provided handout.     Vision Baseline Vision/History: 0 No visual deficits Ability to See in Adequate  Light: 0  Adequate Patient Visual Report: No change from baseline Vision Assessment?: No apparent visual deficits            Pertinent Vitals/Pain Pain Assessment Pain Assessment: 0-10 Pain Score: 5  Pain Location: L hip Pain Descriptors / Indicators: Operative site guarding Pain Intervention(s): Limited activity within patient's tolerance, Monitored during session, Premedicated before session     Hand Dominance Right   Extremity/Trunk Assessment Upper Extremity Assessment Upper Extremity Assessment: Overall WFL for tasks assessed   Lower Extremity Assessment Lower Extremity Assessment: Defer to PT evaluation LLE Deficits / Details: Acute pain, decreased strength and AROM consistent with pre-op diagnosis and sunsequent surgery.   Cervical / Trunk Assessment Cervical / Trunk Assessment: Normal (Forward head posture with rounded shoulders)   Communication Communication Communication: No difficulties   Cognition Arousal/Alertness: Awake/alert Behavior During Therapy: WFL for tasks assessed/performed Overall Cognitive Status: Within Functional Limits for tasks assessed           General Comments  VSS            Home Living Family/patient expects to be discharged to:: Private residence Living Arrangements: Children Available Help at Discharge: Family;Available 24 hours/day (daughter available to assist at home) Type of Home: House Home Access: Level entry     Home Layout: One level     Bathroom Shower/Tub: Teacher, early years/pre: Handicapped height     Home Equipment: Plover - single point          Prior Functioning/Environment Prior Level of Function : Independent/Modified Independent            OT Problem List: Impaired balance (sitting and/or standing)      OT Treatment/Interventions:   Eval only   OT Goals(Current goals can be found in the care plan section) Acute Rehab OT Goals Patient Stated Goal: to get stronger  OT Frequency:  1 time  visit       AM-PAC OT "6 Clicks" Daily Activity     Outcome Measure Help from another person eating meals?: None Help from another person taking care of personal grooming?: None Help from another person toileting, which includes using toliet, bedpan, or urinal?: None Help from another person bathing (including washing, rinsing, drying)?: A Little Help from another person to put on and taking off regular upper body clothing?: None Help from another person to put on and taking off regular lower body clothing?: A Little 6 Click Score: 22   End of Session Equipment Utilized During Treatment: Rolling walker (2 wheels) Nurse Communication: Mobility status  Activity Tolerance: Patient tolerated treatment well Patient left: in chair;with call bell/phone within reach;with chair alarm set  OT Visit Diagnosis: History of falling (Z91.81)                Time: 4696-2952 OT Time Calculation (min): 25 min Charges:  OT General Charges $OT Visit: 1 Visit OT Evaluation $OT Eval Low Complexity: 1 Low  Ailene Ravel, OTR/L,CBIS  Supplemental OT - [facility] and [facility]   Tamari Busic, Clarene Duke 06/24/2022, 11:34 AM

## 2022-06-24 NOTE — Progress Notes (Signed)
Mobility Specialist Progress Note   06/24/22 1228  Mobility  Activity Ambulated with assistance in hallway  Level of Assistance Contact guard assist, steadying assist  Assistive Device Front wheel walker  Distance Ambulated (ft) 210 ft  LLE Weight Bearing WBAT  Activity Response Tolerated well  Mobility Referral Yes  $Mobility charge 1 Mobility   Received pt in chair having no complaints and agreeable to mobility. Pt was asymptomatic throughout ambulation and returned to room w/o fault. Left in bed w/ call bell in reach and bed alarm on.  [place] Falling Mobility Specialist Please contact via SecureChat or  Rehab office at [phone]

## 2022-06-25 DIAGNOSIS — S72002A Fracture of unspecified part of neck of left femur, initial encounter for closed fracture: Secondary | ICD-10-CM | POA: Diagnosis not present

## 2022-06-25 LAB — CBC
HCT: 24.4 % — ABNORMAL LOW (ref 36.0–46.0)
Hemoglobin: 7.9 g/dL — ABNORMAL LOW (ref 12.0–15.0)
MCH: 30.3 pg (ref 26.0–34.0)
MCHC: 32.4 g/dL (ref 30.0–36.0)
MCV: 93.5 fL (ref 80.0–100.0)
Platelets: 308 10*3/uL (ref 150–400)
RBC: 2.61 MIL/uL — ABNORMAL LOW (ref 3.87–5.11)
RDW: 12.9 % (ref 11.5–15.5)
WBC: 10.8 10*3/uL — ABNORMAL HIGH (ref 4.0–10.5)
nRBC: 0 % (ref 0.0–0.2)

## 2022-06-25 LAB — BASIC METABOLIC PANEL
Anion gap: 5 (ref 5–15)
BUN: 6 mg/dL (ref 6–20)
CO2: 29 mmol/L (ref 22–32)
Calcium: 7.6 mg/dL — ABNORMAL LOW (ref 8.9–10.3)
Chloride: 101 mmol/L (ref 98–111)
Creatinine, Ser: 0.51 mg/dL (ref 0.44–1.00)
GFR, Estimated: >60 mL/min (ref >60)
Glucose, Bld: 111 mg/dL — ABNORMAL HIGH (ref 70–99)
Potassium: 3.9 mmol/L (ref 3.5–5.1)
Sodium: 135 mmol/L (ref 135–145)

## 2022-06-25 LAB — MAGNESIUM: Magnesium: 2 mg/dL (ref 1.7–2.4)

## 2022-06-25 MED ORDER — PANTOPRAZOLE SODIUM 40 MG PO TBEC: 40.0000 mg | DELAYED_RELEASE_TABLET | Freq: Every day | ORAL | 0 refills | Status: DC

## 2022-06-25 MED ORDER — FERROUS SULFATE 325 (65 FE) MG PO TABS: 325.0000 mg | ORAL_TABLET | Freq: Every day | ORAL | 0 refills | Status: DC

## 2022-06-25 MED ORDER — ASPIRIN 81 MG PO CHEW: 81.0000 mg | CHEWABLE_TABLET | Freq: Two times a day (BID) | ORAL | 0 refills | Status: AC

## 2022-06-25 MED ORDER — NICOTINE 14 MG/24HR TD PT24: 14.0000 mg | MEDICATED_PATCH | Freq: Every day | TRANSDERMAL | 0 refills | Status: DC

## 2022-06-25 MED ORDER — DOXYCYCLINE HYCLATE 100 MG PO TABS: 100.0000 mg | ORAL_TABLET | Freq: Two times a day (BID) | ORAL | 0 refills | Status: AC

## 2022-06-25 MED ORDER — FOLIC ACID 1 MG PO TABS: 1.0000 mg | ORAL_TABLET | Freq: Every day | ORAL | 0 refills | Status: DC

## 2022-06-25 MED ORDER — OXYCODONE-ACETAMINOPHEN 7.5-325 MG PO TABS: 1.0000 | ORAL_TABLET | Freq: Three times a day (TID) | ORAL | 0 refills | Status: AC | PRN

## 2022-06-25 MED ORDER — CELECOXIB 200 MG PO CAPS: 200.0000 mg | ORAL_CAPSULE | Freq: Two times a day (BID) | ORAL | 0 refills | Status: AC

## 2022-06-25 MED ORDER — SENNA 8.6 MG PO TABS: 1.0000 | ORAL_TABLET | Freq: Two times a day (BID) | ORAL | 0 refills | Status: AC

## 2022-06-25 NOTE — Progress Notes (Addendum)
Mobility Specialist Progress Note   06/25/22 1010  Mobility  Activity Ambulated with assistance in hallway;Ambulated with assistance to bathroom  Level of Assistance Standby assist, set-up cues, supervision of patient - no hands on  Assistive Device Front wheel walker  Distance Ambulated (ft) 615 ft  Range of Motion/Exercises Active;All extremities  LLE Weight Bearing WBAT  Activity Response Tolerated well   Patient received in recliner, eager and motivated to participate. Stood with supervision and ambulated to restroom then in hallway min guard to supervision. Was able to extend distance compared to past sessions. Returned to room without complaint or incident. Was left dangling EOB with all needs met, call bell in reach.   [place] Souleymane Saiki, BS EXP Mobility Specialist Please contact via SecureChat or Rehab office at [phone]

## 2022-06-25 NOTE — Progress Notes (Signed)
Physical Therapy Treatment Patient Details Name: [name] MRN: [id number] DOB: 1966-02-22 Today's Date: 06/25/2022   History of Present Illness Pt is a 57 y/o female who presents s/p fall at home, sustaining a L femoral neck fracture. She is now s/p L THA direct anterior approach on 06/23/2022. PMH significant for Anxiety, HTN, osteoporosis, L patellar ORIF 2016.    PT Comments    Pt progressing towards physical therapy goals. Noted pt ambulated 600+ feet with mobility specialist prior to PT session, so focus of session was therapeutic exercise and HEP. Handout provided and pt demonstrated good technique with all exercise. She anticipates d/c home this afternoon. Will continue to follow.     Recommendations for follow up therapy are one component of a multi-disciplinary discharge planning process, led by the attending physician.  Recommendations may be updated based on patient status, additional functional criteria and insurance authorization.  Follow Up Recommendations  Outpatient PT     Assistance Recommended at Discharge PRN  Patient can return home with the following A little help with walking and/or transfers;A little help with bathing/dressing/bathroom;Assistance with cooking/housework;Assist for transportation;Help with stairs or ramp for entrance   Equipment Recommendations  Rolling walker (2 wheels)    Recommendations for Other Services       Precautions / Restrictions Precautions Precautions: Fall Precaution Comments: Direct anterior approach, no precautions. Restrictions Weight Bearing Restrictions: Yes LLE Weight Bearing: Weight bearing as tolerated     Mobility  Bed Mobility Overal bed mobility: Needs Assistance Bed Mobility: Supine to Sit, Sit to Supine     Supine to sit: Modified independent (Device/Increase time) Sit to supine: Min guard   General bed mobility comments: Pt with mild difficulty elevating LE's up onto bed at end of session but able to  transition to EOB without assist.    Transfers Overall transfer level: Modified independent Equipment used: Rolling walker (2 wheels) Transfers: Sit to/from Stand             General transfer comment: Pt demonstrated proper hand placement on seated surface for safety. No assist required.    Ambulation/Gait Ambulation/Gait assistance: Supervision Gait Distance (Feet): 25 Feet Assistive device: Rolling walker (2 wheels) Gait Pattern/deviations: Step-through pattern, Decreased stride length, Trunk flexed Gait velocity: Decreased Gait velocity interpretation: <1.31 ft/sec, indicative of household ambulator   General Gait Details: Formal gait training deferred as pt just finished ambulation 600+ feet in the hallway with mobility specialist. Pt did ambulate around room to go to/from bathroom, however. Therapist managed IV pole. No overt LOB noted.   Stairs             Wheelchair Mobility    Modified Rankin (Stroke Patients Only)       Balance Overall balance assessment: Needs assistance Sitting-balance support: No upper extremity supported, Feet supported Sitting balance-Leahy Scale: Good     Standing balance support: No upper extremity supported, During functional activity Standing balance-Leahy Scale: Fair Standing balance comment: While washing hands at sink                            Cognition Arousal/Alertness: Awake/alert Behavior During Therapy: WFL for tasks assessed/performed Overall Cognitive Status: Within Functional Limits for tasks assessed                                          Exercises  Total Joint Exercises Ankle Circles/Pumps: 10 reps Quad Sets: 10 reps Short Arc Quad: 10 reps Heel Slides: 10 reps Hip ABduction/ADduction: 10 reps, Supine, Standing Straight Leg Raises: 10 reps, Standing Long Arc Quad: 10 reps Knee Flexion: 10 reps, Standing Standing Hip Extension: 10 reps    General Comments         Pertinent Vitals/Pain Pain Assessment Pain Assessment: Faces Faces Pain Scale: Hurts a little bit Pain Location: L hip Pain Descriptors / Indicators: Operative site guarding Pain Intervention(s): Limited activity within patient's tolerance, Monitored during session, Repositioned    Home Living                          Prior Function            PT Goals (current goals can now be found in the care plan section) Acute Rehab PT Goals Patient Stated Goal: Home today PT Goal Formulation: With patient Time For Goal Achievement: 07/01/22 Potential to Achieve Goals: Good Progress towards PT goals: Progressing toward goals    Frequency    Min 5X/week      PT Plan Current plan remains appropriate    Co-evaluation              AM-PAC PT "6 Clicks" Mobility   Outcome Measure  Help needed turning from your back to your side while in a flat bed without using bedrails?: None Help needed moving from lying on your back to sitting on the side of a flat bed without using bedrails?: A Little Help needed moving to and from a bed to a chair (including a wheelchair)?: A Little Help needed standing up from a chair using your arms (e.g., wheelchair or bedside chair)?: A Little Help needed to walk in hospital room?: A Little Help needed climbing 3-5 steps with a railing? : A Little 6 Click Score: 19    End of Session Equipment Utilized During Treatment: Gait belt Activity Tolerance: Patient tolerated treatment well Patient left: in bed;with call bell/phone within reach Nurse Communication: Mobility status PT Visit Diagnosis: Unsteadiness on feet (R26.81);Pain Pain - Right/Left: Left Pain - part of body: Hip     Time: 1040-1106 PT Time Calculation (min) (ACUTE ONLY): 26 min  Charges:  $Therapeutic Exercise: 23-37 mins                     Rolinda Roan, PT, DPT Acute Rehabilitation Services Secure Chat Preferred Office: [phone]    Thelma Comp 06/25/2022, 12:56 PM

## 2022-06-25 NOTE — Hospital Course (Addendum)
Assessment:     Plan:

## 2022-06-25 NOTE — TOC Transition Note (Signed)
Transition of Care [facility]) - CM/SW Discharge Note   Patient Details  Name: [name] MRN: [id number] Date of Birth: [birth year]  Transition of Care [facility] CM/SW Contact:  Sharin Mons, RN Phone Number: 06/25/2022, 12:49 PM   Clinical Narrative:    Patient will DC to: home  Anticipated DC date: 06/25/2022 Family notified: yes Transport by: car      -s/p TOTAL HIP ARTHROPLASTY 06/23/2022 Per MD patient ready for DC today.RN, patient, and patient's daughter notified of DC. Daughter to assist with care once d/c.Per PT's eval/recommendation :outpatient PT. Pt states would like outpt services, NCM made provider ( Dr.Swinteck ) aware. Per pt [place] to [place] home [place] and states she ok for MD to make outpatient PT referral there. Referral for DME RW made with Kristen/[facility] Pt without RX med concerns. Post hospital f/u noted on AVS. Daughter to provide transportation to home.  RNCM will sign off for now as intervention is no longer needed. Please consult [place] again if new needs arise.    Final next level of care: Home/Self Care Barriers to Discharge: No Barriers Identified   Patient Goals and CMS Choice   Choice offered to / list presented to : Patient  Discharge Placement                         Discharge Plan and Services Additional resources added to the After Visit Summary for                  DME Arranged: Walker rolling DME Agency: [facility] Date DME Agency Contacted: 06/25/22 Time DME Agency Contacted: [phone] Representative spoke with at DME Agency: [place] ([place] Interventions [place]: No Food Insecurity (06/23/2022)  Housing: Low Risk  (06/23/2022)  Transportation Needs: No Transportation Needs (06/23/2022)  Utilities: Not At Risk (06/23/2022)  Depression (PHQ2-9): Medium Risk (03/25/2022)  Tobacco Use: High Risk (06/24/2022)     Readmission Risk Interventions     No  data to display

## 2022-06-25 NOTE — Discharge Summary (Signed)
Physician Discharge Summary  [name] UKG:[id number] DOB: 1966-04-02 DOA: 06/22/2022  PCP: Sharion Balloon, FNP  Admit date: 06/22/2022 Discharge date: 06/25/2022  Admitted From: Home  Discharge disposition: Home with outpatient PT  Recommendations for Outpatient Follow-Up:   Follow up with your primary care provider in one week.  Check CBC, BMP, magnesium in the next visit Follow-up with Dr. Lyla Glassing orthopedics in 2 weeks  Discharge Diagnosis:   Principal Problem:   Closed left hip fracture ([place]) Active Problems:   HLD (hyperlipidemia)   Tobacco abuse   Anxiety with depression   Hypokalemia   Allergic rhinitis  Discharge Condition: Improved.  Diet recommendation: Low sodium, heart healthy.    Wound care: None.  Code status: Full.   History of Present Illness:   Patient is a 57 years old female with past medical history of osteoporosis, hypertension, hyperlipidemia, anxiety and tobacco abuse presented to hospital with mechanical fall on the left side.  Subsequently she was unable to bear weight on that leg.  In the ED, patient was noted to have acute subcapital left femoral fracture with valgus impaction and patient was admitted hospital for further evaluation and treatment.  Hospital Course:   Following conditions were addressed during hospitalization as listed below,   Closed displaced  Left Femoral neck Fracture: Orthopedics was consulted and patient underwent total left hip arthroplasty by Dr Lyla Glassing 06/23/2022.  Physical therapy has been consulted and recommended no special therapy needs outpatient PT..  Orthopedic recommend aspirin for prophylaxis.  Will need to follow-up with orthopedics as outpatient in 2 weeks for wound check x-rays.  Patient has been able to ambulate.   Leukocytosis :  Urine analysis negative.  Urine culture negative.  Had some cough and greenish phlegm production and suspected pneumonia.  WBC at 10.8.  On Rocephin and  Zithromax. Chest x-ray showed hazy opacity in the right lung base.  Patient is on room air.  Afebrile.  Prescribed 3 more days of doxycycline on discharge to complete the course.   Hypokalemia: Replenished and improved.  Latest potassium of 3.9.   Acute Blood loss anemia; postoperative.   Latest hemoglobin of 7.9.  No need for transfusion.  Recommend outpatient periodic monitoring.   Anxiety and depression Continue with Wellbutrin and Cymbalta   Tobacco abuse: On nicotine patch.  Counseling done.   Hyperlipidemia: Continue statins.   Allergic rhinitis: Continue Flonase  Disposition.  At this time, patient is stable for disposition with outpatient PCP and orthopedic follow-up  Medical Consultants:   Orthopedics  Procedures:    Total hip arthroplasty 06/23/2022. Subjective:   Today, patient seen and examined at bedside.  In good spirits.  Denies any cough shortness of breath chest pain fever or chills.  Has mild pain.  Was able to ambulate with physical therapy.  Discharge Exam:   Vitals:   06/25/22 0439 06/25/22 0752  BP: 101/66 111/86  Pulse: 97 97  Resp: 16   Temp: 98 F (36.7 C) 98.1 F (36.7 C)  SpO2: 98% 99%   Vitals:   06/24/22 1545 06/24/22 2110 06/25/22 0439 06/25/22 0752  BP: 106/77 131/79 101/66 111/86  Pulse: (!) 103 96 97 97  Resp: '17 16 16   '$ Temp: 97.8 F (36.6 C) 98 F (36.7 C) 98 F (36.7 C) 98.1 F (36.7 C)  TempSrc: Oral Oral Oral Oral  SpO2: 100% 100% 98% 99%  Weight:      Height:        General: Alert awake,  not in obvious distress, thinly built HENT: pupils equally reacting to light,  No scleral pallor or icterus noted. Oral mucosa is moist.  Chest:   Diminished breath sounds bilaterally. No crackles or wheezes.  CVS: S1 &S2 heard. No murmur.  Regular rate and rhythm. Abdomen: Soft, nontender, nondistended.  Bowel sounds are heard.   Extremities: No cyanosis, clubbing or edema.  Peripheral pulses are palpable.  Surgical site.  Appears  mildly edematous but healthy, covered with dressing.Marland Kitchen Psych: Alert, awake and oriented, normal mood CNS:  No cranial nerve deficits.  Power equal in all extremities.   Skin: Warm and dry.  No rashes noted.  The results of significant diagnostics from this hospitalization (including imaging, microbiology, ancillary and laboratory) are listed below for reference.     Diagnostic Studies:   DG HIP UNILAT WITH PELVIS 1V LEFT  Result Date: 06/23/2022 CLINICAL DATA:  Intraoperative fluoroscopy for total left hip arthroplasty. EXAM: DG HIP (WITH OR WITHOUT PELVIS) 1V*L* COMPARISON:  Pelvis and left hip radiographs 06/22/2022 FINDINGS: Images were performed intraoperatively without the presence of a radiologist. New total left hip arthroplasty. No hardware complication is seen. Total fluoroscopy images: Total fluoroscopy time: 17 seconds Total dose: Radiation Exposure Index (as provided by the fluoroscopic device): 1.14 mGy air Kerma Please see intraoperative findings for further detail. IMPRESSION: Intraoperative fluoroscopy for total left hip arthroplasty. Electronically Signed   By: Yvonne Kendall M.D.   On: 06/23/2022 15:22   DG C-Arm 1-60 Min-No Report  Result Date: 06/23/2022 Fluoroscopy was utilized by the requesting physician.  No radiographic interpretation.   DG C-Arm 1-60 Min-No Report  Result Date: 06/23/2022 Fluoroscopy was utilized by the requesting physician.  No radiographic interpretation.   DG Knee Left Port  Result Date: 06/23/2022 CLINICAL DATA:  Proximal femur fracture.  Evaluate knee. EXAM: PORTABLE LEFT KNEE - 2 VIEW COMPARISON:  03/22/2015 radiographs FINDINGS: There is no evidence of acute fracture subluxation or dislocation. No joint effusion is noted. Fixation hardware within the patella again noted. IMPRESSION: 1. No evidence of acute abnormality. Electronically Signed   By: Margarette Canada M.D.   On: 06/23/2022 09:48   DG Hip Unilat W or Wo Pelvis 2-3 Views Left  Result Date:  06/22/2022 CLINICAL DATA:  57 year old female status post fall.  Pain. EXAM: DG HIP (WITH OR WITHOUT PELVIS) 2-3V LEFT COMPARISON:  Lumbar radiographs 12/11/2015. FINDINGS: On the AP view of the pelvis dextroconvex lumbar scoliosis appears progressed since 2017. The patient is somewhat oblique to the right. Femoral heads remain normally located. Mild pubic symphysis irregularity might be remote trauma. No acute pelvis fracture identified. Grossly intact proximal right femur. Acute left femoral neck fracture with varus impaction. Subcapital fracture location. The intertrochanteric segment appears to remain intact. IMPRESSION: 1. Acute subcapital left femoral neck fracture with varus impaction. 2. No other acute osseous abnormality identified. Electronically Signed   By: Genevie Ann M.D.   On: 06/22/2022 04:45     Labs:   Basic Metabolic Panel: Recent Labs  Lab 06/22/22 0452 06/22/22 0946 06/23/22 0753 06/24/22 0416 06/25/22 0322  NA 137  --  134* 136 135  K 3.1*  --  3.5 3.7 3.9  CL 103  --  100 99 101  CO2 28  --  '28 27 29  '$ GLUCOSE 109*  --  112* 130* 111*  BUN 13  --  '8 7 6  '$ CREATININE 0.68 0.57 0.47 0.61 0.51  CALCIUM 8.4*  --  8.4* 8.2* 7.6*  MG  --  1.9 1.7  --  2.0   GFR Estimated Creatinine Clearance: 63.3 mL/min (by C-G formula based on SCr of 0.51 mg/dL). Liver Function Tests: No results for input(s): "AST", "ALT", "ALKPHOS", "BILITOT", "PROT", "ALBUMIN" in the last 168 hours. No results for input(s): "LIPASE", "AMYLASE" in the last 168 hours. No results for input(s): "AMMONIA" in the last 168 hours. Coagulation profile No results for input(s): "INR", "PROTIME" in the last 168 hours.  CBC: Recent Labs  Lab 06/22/22 0452 06/22/22 0946 06/23/22 0753 06/24/22 0416 06/25/22 0322  WBC 12.7* 10.9* 14.9* 17.3* 10.8*  NEUTROABS 9.4*  --   --   --   --   HGB 11.0* 12.5 11.7* 8.5* 7.9*  HCT 33.3* 38.1 36.3 26.2* 24.4*  MCV 93.5 93.6 95.0 94.6 93.5  PLT 387 425* 401* 301 308    Cardiac Enzymes: No results for input(s): "CKTOTAL", "CKMB", "CKMBINDEX", "TROPONINI" in the last 168 hours. BNP: Invalid input(s): "POCBNP" CBG: No results for input(s): "GLUCAP" in the last 168 hours. D-Dimer No results for input(s): "DDIMER" in the last 72 hours. Hgb A1c No results for input(s): "HGBA1C" in the last 72 hours. Lipid Profile No results for input(s): "CHOL", "HDL", "LDLCALC", "TRIG", "CHOLHDL", "LDLDIRECT" in the last 72 hours. Thyroid function studies No results for input(s): "TSH", "T4TOTAL", "T3FREE", "THYROIDAB" in the last 72 hours.  Invalid input(s): "FREET3" Anemia work up No results for input(s): "VITAMINB12", "FOLATE", "FERRITIN", "TIBC", "IRON", "RETICCTPCT" in the last 72 hours. Microbiology Recent Results (from the past 240 hour(s))  Surgical pcr screen     Status: Abnormal   Collection Time: 06/23/22 10:23 AM   Specimen: Nasal Mucosa; Nasal Swab  Result Value Ref Range Status   MRSA, PCR NEGATIVE NEGATIVE Final   Staphylococcus aureus POSITIVE (A) NEGATIVE Final    Comment: (NOTE) The Xpert SA Assay (FDA approved for NASAL specimens in patients 65 years of age and older), is one component of a comprehensive surveillance program. It is not intended to diagnose infection nor to guide or monitor treatment. Performed at [facility], [place] [address] [place], [place] [address]   Urine Culture     Status: None   Collection Time: 06/23/22  3:28 PM   Specimen: Urine, In & Out Cath  Result Value Ref Range Status   Specimen Description IN/OUT CATH URINE  Final   Special Requests NONE  Final   Culture   Final    NO GROWTH Performed at [facility], [place] [address] [place], [place] [address]    Report Status 06/24/2022 FINAL  Final     Discharge Instructions:   Discharge Instructions     Diet general   Complete by: As directed    Discharge instructions   Complete by: As directed    Follow-up with your primary care provider in 1  week.  Follow-up with orthopedics in 2 weeks for wound check/ x-rays.  Continue physical therapy as outpatient.  Weightbearing as tolerated.  Seek medical attention for worsening symptoms.   Increase activity slowly   Complete by: As directed       Allergies as of 06/25/2022       Reactions   Codeine Other (See Comments)   Makes pt.have seizure   Hydrocodone    May have caused a seizure per patient reported.        Medication List     STOP taking these medications    alendronate 70 MG tablet Commonly known as: FOSAMAX   ibuprofen  600 MG tablet Commonly known as: ADVIL       TAKE these medications    aspirin 81 MG chewable tablet Chew 1 tablet (81 mg total) by mouth 2 (two) times daily.   buPROPion 150 MG 24 hr tablet Commonly known as: WELLBUTRIN XL Take 1 tablet (150 mg total) by mouth daily.   calcium-vitamin D 500-200 MG-UNIT tablet Commonly known as: OSCAL WITH D Take 1 tablet by mouth daily.   celecoxib 200 MG capsule Commonly known as: CELEBREX Take 1 capsule (200 mg total) by mouth 2 (two) times daily for 10 days.   cyclobenzaprine 5 MG tablet Commonly known as: FLEXERIL TAKE 1 TABLET BY MOUTH 2 TIMES DAILY AS NEEDED FOR MUSCLE SPASMS. What changed: See the new instructions.   diphenhydrAMINE 25 mg capsule Commonly known as: BENADRYL Take 25 mg by mouth daily.   docusate sodium 100 MG capsule Commonly known as: COLACE Take 100 mg by mouth daily.   doxycycline 100 MG tablet Commonly known as: VIBRA-TABS Take 1 tablet (100 mg total) by mouth 2 (two) times daily for 3 days.   DULoxetine 60 MG capsule Commonly known as: Cymbalta Take 1 capsule (60 mg total) by mouth daily.   ferrous sulfate 325 (65 FE) MG tablet Take 1 tablet (325 mg total) by mouth daily with breakfast. Start taking on: June 26, 2022   fexofenadine 180 MG tablet Commonly known as: ALLEGRA Take 180 mg by mouth daily. OTC   fluticasone 50 MCG/ACT nasal spray Commonly  known as: FLONASE SPRAY 2 SPRAYS INTO EACH NOSTRIL EVERY DAY What changed: See the new instructions.   folic acid 1 MG tablet Commonly known as: FOLVITE Take 1 tablet (1 mg total) by mouth daily. Start taking on: June 26, 2022   hydrOXYzine 25 MG capsule Commonly known as: VISTARIL TAKE 1 CAPSULE BY MOUTH THREE TIMES A DAY AS NEEDED What changed: See the new instructions.   nicotine 14 mg/24hr patch Commonly known as: NICODERM CQ - dosed in mg/24 hours Place 1 patch (14 mg total) onto the skin daily. Start taking on: June 26, 2022   oxyCODONE-acetaminophen 7.5-325 MG tablet Commonly known as: Percocet Take 1 tablet by mouth every 8 (eight) hours as needed for up to 5 days for severe pain (if inot improved with celebrex).   pantoprazole 40 MG tablet Commonly known as: Protonix Take 1 tablet (40 mg total) by mouth daily.   senna 8.6 MG Tabs tablet Commonly known as: SENOKOT Take 1 tablet (8.6 mg total) by mouth 2 (two) times daily for 10 days.   Simethicone 125 MG Caps Commonly known as: Gas-X Extra Strength Take 1 capsule (125 mg total) by mouth daily.   simvastatin 20 MG tablet Commonly known as: ZOCOR TAKE 1 TABLET BY MOUTH DAILY AT 6 PM.        Follow-up Information     Swinteck, Aaron Edelman, MD. Schedule an appointment as soon as possible for a visit in 2 week(s).   Specialty: Orthopedic Surgery Why: For suture removal, For wound re-check Contact information: [address] [place] [address] [phone]         Evelina Dun A, FNP Follow up in 1 week(s).   Specialty: Family Medicine Contact information: [place] [address] [phone]                  Time coordinating discharge: 39 minutes  Signed:  Yasir Kitner  [facility] 06/25/2022, 10:29 AM

## 2022-06-25 NOTE — Plan of Care (Signed)

## 2022-06-27 ENCOUNTER — Telehealth: Payer: Self-pay | Admitting: Family

## 2022-06-27 NOTE — Telephone Encounter (Signed)
Pt says she just had hip surgery and says she's been running a fever. Also says she hasn't had a bowel movement since before surgery. Says she has been taking miralax and stool softners but not helping.   Please advise and call patient.

## 2022-06-27 NOTE — Telephone Encounter (Signed)
She needs to call her surgeon now if she is having fever! Is her wound red, any discharge?

## 2022-06-27 NOTE — Telephone Encounter (Signed)
Patient aware and verbalized understanding. Patient is going to call surgeon right aware.

## 2022-06-29 ENCOUNTER — Other Ambulatory Visit: Payer: Self-pay | Admitting: Family

## 2022-06-29 DIAGNOSIS — G8929 Other chronic pain: Secondary | ICD-10-CM

## 2022-07-01 ENCOUNTER — Ambulatory Visit (INDEPENDENT_AMBULATORY_CARE_PROVIDER_SITE_OTHER): Payer: Medicaid Other | Admitting: Family

## 2022-07-01 ENCOUNTER — Other Ambulatory Visit: Payer: Self-pay | Admitting: Family

## 2022-07-01 ENCOUNTER — Encounter: Payer: Self-pay | Admitting: Family

## 2022-07-01 VITALS — BP 124/81 | HR 105 | Temp 97.5°F | Ht 65.98 in | Wt 119.0 lb

## 2022-07-01 DIAGNOSIS — Z720 Tobacco use: Secondary | ICD-10-CM | POA: Diagnosis not present

## 2022-07-01 DIAGNOSIS — Z8781 Personal history of (healed) traumatic fracture: Secondary | ICD-10-CM

## 2022-07-01 DIAGNOSIS — K5900 Constipation, unspecified: Secondary | ICD-10-CM

## 2022-07-01 DIAGNOSIS — I10 Essential (primary) hypertension: Secondary | ICD-10-CM | POA: Diagnosis not present

## 2022-07-01 DIAGNOSIS — Z09 Encounter for follow-up examination after completed treatment for conditions other than malignant neoplasm: Secondary | ICD-10-CM | POA: Diagnosis not present

## 2022-07-01 NOTE — Progress Notes (Signed)
Subjective:    Patient ID: [name], female    DOB: 1966/02/28, 57 y.o.   MRN: [id number]  Chief Complaint  Patient presents with   Hospitalization Follow-up   Pt presents to the office today for hospital follow up. She fell in her home on 06/22/22 and broke her left hip. She had surgery 06/23/22. Reports she is doing well. She reports aching pain of 4 out 10. Using a rolling walker.  Hip Pain  The incident occurred more than 1 week ago. The injury mechanism was a fall. The pain is present in the left hip. The pain is at a severity of 4/10. The pain is moderate. The pain has been Intermittent since onset. Pertinent negatives include no loss of motion, loss of sensation, muscle weakness, numbness or tingling. She reports no foreign bodies present. The symptoms are aggravated by movement and weight bearing. She has tried acetaminophen for the symptoms. The treatment provided mild relief.  Nicotine Dependence Presents for follow-up visit. Symptoms include irritability. Her urge triggers include company of smokers. The symptoms have been stable. She smokes < 1/2 a pack of cigarettes per day.  Hypertension This is a chronic problem. The current episode started more than 1 year ago. The problem has been resolved since onset. The problem is controlled. Associated symptoms include anxiety and malaise/fatigue. Pertinent negatives include no peripheral edema or shortness of breath. Risk factors for coronary artery disease include dyslipidemia and sedentary lifestyle. The current treatment provides moderate improvement.  Anxiety Presents for follow-up visit. Symptoms include excessive worry, irritability, nervous/anxious behavior and restlessness. Patient reports no shortness of breath. Symptoms occur occasionally. The severity of symptoms is moderate.    Constipation This is a new problem. The problem has been waxing and waning since onset. Her stool frequency is 2 to 3 times per week. She has  tried laxatives and stool softeners for the symptoms. The treatment provided mild relief.      Review of Systems  Constitutional:  Positive for irritability and malaise/fatigue.  Respiratory:  Negative for shortness of breath.   Gastrointestinal:  Positive for constipation.  Neurological:  Negative for tingling and numbness.  Psychiatric/Behavioral:  The patient is nervous/anxious.   All other systems reviewed and are negative.      Objective:   Physical Exam Vitals reviewed.  Constitutional:      General: She is not in acute distress.    Appearance: She is well-developed.  HENT:     Head: Normocephalic and atraumatic.  Eyes:     Pupils: Pupils are equal, round, and reactive to light.  Neck:     Thyroid: No thyromegaly.  Cardiovascular:     Rate and Rhythm: Normal rate and regular rhythm.     Heart sounds: Normal heart sounds. No murmur heard. Pulmonary:     Effort: Pulmonary effort is normal. No respiratory distress.     Breath sounds: Normal breath sounds. No wheezing.  Abdominal:     General: Bowel sounds are normal. There is no distension.     Palpations: Abdomen is soft.     Tenderness: There is no abdominal tenderness.  Musculoskeletal:        General: No tenderness.     Cervical back: Normal range of motion and neck supple.     Comments: Pain in left hip with rotation and flexion  Skin:    General: Skin is warm and dry.     Coloration: Skin is pale.  Neurological:     Mental  Status: She is alert and oriented to person, place, and time.     Cranial Nerves: No cranial nerve deficit.     Motor: Weakness (using rolling walker) present.     Gait: Gait abnormal.     Deep Tendon Reflexes: Reflexes are normal and symmetric.  Psychiatric:        Behavior: Behavior normal.        Thought Content: Thought content normal.        Judgment: Judgment normal.       BP 124/81   Pulse (!) 105   Temp (!) 97.5 F (36.4 C) (Temporal)   Ht 5' 5.98" (1.676 m)   Wt 119  lb (54 kg)   SpO2 100%   BMI 19.22 kg/m      Assessment & Plan:  [name] comes in today with chief complaint of Hospitalization Follow-up   Diagnosis and orders addressed:  1. H/O fracture of left hip - CMP14+EGFR - CBC with Differential/Platelet  2. Hospital discharge follow-up Hospital notes reviewed  - CMP14+EGFR - CBC with Differential/Platelet  3. Tobacco abuse - CMP14+EGFR - CBC with Differential/Platelet  4. Essential hypertension, benign - CMP14+EGFR - CBC with Differential/Platelet  5. Constipation, unspecified constipation type  - CMP14+EGFR - CBC with Differential/Platelet   Labs pending Health Maintenance reviewed Diet and exercise encouraged  Follow up plan: Keep chronic follow up   Evelina Dun, FNP

## 2022-07-01 NOTE — Patient Instructions (Signed)
Hip Fracture Treated With ORIF, Care After The following information offers guidance on how to care for yourself after your procedure. Your health care provider may also give you more specific instructions. If you have problems or questions, contact your health care provider. What can I expect after the procedure? After the procedure, it is common to have: Pain. You will be given medicines to treat this. Swelling. Difficulty walking. Some redness or bruising around the incision. A small amount of fluid or blood from the incision. Follow these instructions at home: Medicines Take over-the-counter and prescription medicines only as told by your health care provider. You may be given a blood thinner to take for up to 6 weeks. This will help reduce the risk of developing a blood clot. It is important to use this medicine exactly as directed. Ask your health care provider if the medicine prescribed to you: Requires you to avoid driving or using machinery. Can cause constipation. You may need to take these actions to prevent or treat constipation: Drink enough fluid to keep your urine pale yellow. Take over-the-counter or prescription medicines. Eat foods that are high in fiber, such as beans, whole grains, and fresh fruits and vegetables. Limit foods that are high in fat and processed sugars, such as fried or sweet foods. You may be given calcium and vitamin D supplements to strengthen your bones. Bathing Do not take baths, swim, or use a hot tub until your health care provider approves. Ask your health care provider if you may take showers. You may only be allowed to take sponge baths. Keep your bandage (dressing) dry. Incision care  Follow instructions from your health care provider about how to take care of your incision. Make sure you: Wash your hands with soap and water for at least 20 seconds before and after you change your dressing. If soap and water are not available, use hand  sanitizer. Change your dressing as told by your health care provider. You may be asked to leave the dressing in place until your clinic visit. Leave stitches (sutures), staples, skin glue, or adhesive strips in place. These skin closures may need to stay in place for 2 weeks or longer. If adhesive strip edges start to loosen and curl up, you may trim the loose edges. Do not remove adhesive strips completely unless your health care provider tells you to do that. Check your incision area every day for signs of infection. Check for: More redness, swelling, or pain. More fluid or blood. Warmth. Pus or a bad smell. Managing pain, stiffness, and swelling  If directed, put ice on the affected area. To do this: Put ice in a plastic bag. Place a towel between your skin and the bag. Leave the ice on for 20 minutes, 2-3 times a day. Remove the ice if your skin turns bright red. This is very important. If you cannot feel pain, heat, or cold, you have a greater risk of damage to the area. Move your hips, knees, ankles, and toes often to reduce stiffness and swelling. Raise (elevate) your leg above the level of your heart while you are lying down. To do this, try putting a few pillows under your leg. Activity  Return to your normal activities as told by your health care provider. Ask your health care provider what activities are safe for you. Do exercises as told by your health care provider or physical therapist. This will help make your hip stronger and help you recover more quickly. Do not   use your injured limb to support (bear) your body weight until your health care provider says that you can. Follow weight-bearing restrictions as told by your health care provider. Use crutches, a walker, or other devices (assistive devices) to help you move around as directed. You may feel most comfortable using a raised surface when sitting on the toilet or in a chair. Consider using a toilet seat riser over the  toilet for comfort. General instructions Wear compression stockings as told by your health care provider. These stockings help to prevent blood clots and reduce swelling in your legs. Do not use any products that contain nicotine or tobacco. These products include cigarettes, chewing tobacco, and vaping devices, such as e-cigarettes. These can delay bone healing and increase your risk of infection. If you need help quitting, ask your health care provider. Keep all follow-up visits. This is important. This may include visits for: Physical therapy. Screening for osteoporosis. Osteoporosis is the thinning and loss of density in your bones. Contact a health care provider if: You have a fever. You have pain that is not helped with medicine. You have more redness, swelling, or pain at your incision area. You have more fluid or blood coming from your incision or leaking through your dressing. You notice that your incision feels warm to the touch. You have pus or a bad smell coming from your incision area. Get help right away if: You notice that the edges of your incision have come apart after the sutures or staples have been removed. You have pain, warmth, or tenderness in the back of your lower leg (calf). You have tingling or numbness in your leg. You have a pale and cold leg. You have trouble breathing. You have chest pain. These symptoms may be an emergency. Get help right away. Call 911. Do not wait to see if the symptoms will go away. Do not drive yourself to the hospital. Summary After the procedure, it is common to have some pain and swelling. Take pain medicines as directed by your health care provider. Icing may also help with pain control. Contact your health care provider if you have any signs of infection, such as more redness, swelling, or pain at your incision area, or more fluid or blood coming from your incision. This information is not intended to replace advice given to you by  your health care provider. Make sure you discuss any questions you have with your health care provider. Document Revised: 04/25/2021 Document Reviewed: 04/25/2021 Elsevier Patient Education  2023 Elsevier Inc.  

## 2022-07-02 LAB — CBC WITH DIFFERENTIAL/PLATELET
Basophils Absolute: 0.1 10*3/uL (ref 0.0–0.2)
Basos: 1 %
EOS (ABSOLUTE): 0.5 10*3/uL — ABNORMAL HIGH (ref 0.0–0.4)
Eos: 5 %
Hematocrit: 28.6 % — ABNORMAL LOW (ref 34.0–46.6)
Hemoglobin: 9.4 g/dL — ABNORMAL LOW (ref 11.1–15.9)
Immature Grans (Abs): 0.1 10*3/uL (ref 0.0–0.1)
Immature Granulocytes: 2 %
Lymphocytes Absolute: 2.6 10*3/uL (ref 0.7–3.1)
Lymphs: 29 %
MCH: 31 pg (ref 26.6–33.0)
MCHC: 32.9 g/dL (ref 31.5–35.7)
MCV: 94 fL (ref 79–97)
Monocytes Absolute: 1.3 10*3/uL — ABNORMAL HIGH (ref 0.1–0.9)
Monocytes: 14 %
Neutrophils Absolute: 4.4 10*3/uL (ref 1.4–7.0)
Neutrophils: 49 %
Platelets: 677 10*3/uL — ABNORMAL HIGH (ref 150–450)
RBC: 3.03 x10E6/uL — ABNORMAL LOW (ref 3.77–5.28)
RDW: 12.5 % (ref 11.7–15.4)
WBC: 8.9 10*3/uL (ref 3.4–10.8)

## 2022-07-02 LAB — CMP14+EGFR
ALT: 16 IU/L (ref 0–32)
AST: 18 IU/L (ref 0–40)
Albumin/Globulin Ratio: 1.2 (ref 1.2–2.2)
Albumin: 3.3 g/dL — ABNORMAL LOW (ref 3.8–4.9)
Alkaline Phosphatase: 75 IU/L (ref 44–121)
BUN/Creatinine Ratio: 13 (ref 9–23)
BUN: 9 mg/dL (ref 6–24)
Bilirubin Total: <0.2 mg/dL (ref 0.0–1.2)
CO2: 25 mmol/L (ref 20–29)
Calcium: 8.7 mg/dL (ref 8.7–10.2)
Chloride: 101 mmol/L (ref 96–106)
Creatinine, Ser: 0.7 mg/dL (ref 0.57–1.00)
Globulin, Total: 2.8 g/dL (ref 1.5–4.5)
Glucose: 94 mg/dL (ref 70–99)
Potassium: 4 mmol/L (ref 3.5–5.2)
Sodium: 139 mmol/L (ref 134–144)
Total Protein: 6.1 g/dL (ref 6.0–8.5)
eGFR: 101 mL/min/{1.73_m2} (ref >59)

## 2022-07-03 ENCOUNTER — Encounter: Payer: Self-pay | Admitting: *Deleted

## 2022-07-14 DIAGNOSIS — Z471 Aftercare following joint replacement surgery: Secondary | ICD-10-CM | POA: Diagnosis not present

## 2022-07-14 DIAGNOSIS — S72032D Displaced midcervical fracture of left femur, subsequent encounter for closed fracture with routine healing: Secondary | ICD-10-CM | POA: Diagnosis not present

## 2022-07-17 ENCOUNTER — Other Ambulatory Visit: Payer: Self-pay | Admitting: Family

## 2022-07-17 DIAGNOSIS — F411 Generalized anxiety disorder: Secondary | ICD-10-CM

## 2022-08-03 ENCOUNTER — Other Ambulatory Visit: Payer: Self-pay | Admitting: Family

## 2022-08-04 ENCOUNTER — Telehealth: Payer: Self-pay | Admitting: Family

## 2022-08-04 DIAGNOSIS — Z471 Aftercare following joint replacement surgery: Secondary | ICD-10-CM | POA: Diagnosis not present

## 2022-08-04 DIAGNOSIS — S72032D Displaced midcervical fracture of left femur, subsequent encounter for closed fracture with routine healing: Secondary | ICD-10-CM | POA: Diagnosis not present

## 2022-08-04 NOTE — Telephone Encounter (Signed)
pharmacy: PT IS REQUESTING A REFILL. THANKS!  Not on current med list, DCd 06/22/22 reason says pt preference Nicotine patches were prescribed on 06/26/22 by Dr Narda Bonds advise

## 2022-08-04 NOTE — Telephone Encounter (Signed)
  Prescription Request  08/04/2022   What is the name of the medication or equipment? NICOTINE GUM  Have you contacted your pharmacy to request a refill? YES  Which pharmacy would you like this sent to? [facility] [place]

## 2022-08-04 NOTE — Telephone Encounter (Signed)
Lmtcb.

## 2022-08-04 NOTE — Telephone Encounter (Signed)
Prescription sent to pharmacy.

## 2022-08-05 NOTE — Telephone Encounter (Signed)
Pt aware.

## 2022-08-15 MED ORDER — NICOTINE 14 MG/24HR TD PT24: 14.0000 mg | MEDICATED_PATCH | Freq: Every day | TRANSDERMAL | 2 refills | Status: DC

## 2022-08-15 NOTE — Telephone Encounter (Signed)
Pt says she also needs Rx sent for the Nicotine patches because she is out.

## 2022-08-15 NOTE — Telephone Encounter (Signed)
Prescription sent to pharmacy.

## 2022-08-15 NOTE — Addendum Note (Signed)
Addended by: Evelina Dun A on: 08/15/2022 04:47 PM   Modules accepted: Orders

## 2022-09-23 ENCOUNTER — Ambulatory Visit: Payer: Medicaid Other | Admitting: Family

## 2022-09-25 ENCOUNTER — Ambulatory Visit: Payer: Medicaid Other | Admitting: Family

## 2022-09-26 ENCOUNTER — Encounter: Payer: Self-pay | Admitting: Family

## 2022-09-30 ENCOUNTER — Other Ambulatory Visit: Payer: Self-pay | Admitting: Family

## 2022-10-04 ENCOUNTER — Other Ambulatory Visit: Payer: Self-pay | Admitting: Family

## 2022-10-04 DIAGNOSIS — G8929 Other chronic pain: Secondary | ICD-10-CM

## 2022-10-08 ENCOUNTER — Other Ambulatory Visit: Payer: Self-pay | Admitting: Family

## 2022-10-08 DIAGNOSIS — F411 Generalized anxiety disorder: Secondary | ICD-10-CM

## 2022-10-08 DIAGNOSIS — E782 Mixed hyperlipidemia: Secondary | ICD-10-CM

## 2022-10-09 ENCOUNTER — Encounter: Payer: Self-pay | Admitting: Family

## 2022-10-09 ENCOUNTER — Ambulatory Visit (INDEPENDENT_AMBULATORY_CARE_PROVIDER_SITE_OTHER): Payer: Medicaid Other | Admitting: Family

## 2022-10-09 VITALS — BP 138/85 | HR 88 | Temp 97.1°F | Ht 65.89 in | Wt 121.0 lb

## 2022-10-09 DIAGNOSIS — M818 Other osteoporosis without current pathological fracture: Secondary | ICD-10-CM | POA: Diagnosis not present

## 2022-10-09 DIAGNOSIS — Z720 Tobacco use: Secondary | ICD-10-CM | POA: Diagnosis not present

## 2022-10-09 DIAGNOSIS — M545 Low back pain, unspecified: Secondary | ICD-10-CM | POA: Diagnosis not present

## 2022-10-09 DIAGNOSIS — F418 Other specified anxiety disorders: Secondary | ICD-10-CM

## 2022-10-09 DIAGNOSIS — D509 Iron deficiency anemia, unspecified: Secondary | ICD-10-CM

## 2022-10-09 DIAGNOSIS — M5441 Lumbago with sciatica, right side: Secondary | ICD-10-CM | POA: Diagnosis not present

## 2022-10-09 DIAGNOSIS — G8929 Other chronic pain: Secondary | ICD-10-CM

## 2022-10-09 DIAGNOSIS — Z8781 Personal history of (healed) traumatic fracture: Secondary | ICD-10-CM | POA: Diagnosis not present

## 2022-10-09 DIAGNOSIS — K5900 Constipation, unspecified: Secondary | ICD-10-CM

## 2022-10-09 DIAGNOSIS — I10 Essential (primary) hypertension: Secondary | ICD-10-CM

## 2022-10-09 DIAGNOSIS — F411 Generalized anxiety disorder: Secondary | ICD-10-CM

## 2022-10-09 DIAGNOSIS — E782 Mixed hyperlipidemia: Secondary | ICD-10-CM

## 2022-10-09 DIAGNOSIS — Z122 Encounter for screening for malignant neoplasm of respiratory organs: Secondary | ICD-10-CM

## 2022-10-09 MED ORDER — SIMVASTATIN 20 MG PO TABS: ORAL_TABLET | ORAL | 0 refills | Status: DC

## 2022-10-09 MED ORDER — DULOXETINE HCL 60 MG PO CPEP: 60.0000 mg | ORAL_CAPSULE | Freq: Every day | ORAL | 3 refills | Status: DC

## 2022-10-09 MED ORDER — HYDROXYZINE PAMOATE 25 MG PO CAPS: 25.0000 mg | ORAL_CAPSULE | Freq: Four times a day (QID) | ORAL | 1 refills | Status: DC | PRN

## 2022-10-09 MED ORDER — ALENDRONATE SODIUM 70 MG PO TABS: 70.0000 mg | ORAL_TABLET | ORAL | 1 refills | Status: DC

## 2022-10-09 MED ORDER — CYCLOBENZAPRINE HCL 5 MG PO TABS: ORAL_TABLET | ORAL | 2 refills | Status: DC

## 2022-10-09 MED ORDER — POLYETHYLENE GLYCOL 3350 17 GM/SCOOP PO POWD: 17.0000 g | Freq: Two times a day (BID) | ORAL | 1 refills | Status: DC | PRN

## 2022-10-09 NOTE — Patient Instructions (Signed)

## 2022-10-09 NOTE — Progress Notes (Signed)
Subjective:    Patient ID: [name], female    DOB: 10-03-1965, 57 y.o.   MRN: [id number]  Chief Complaint  Patient presents with   Medical Management of Chronic Issues   PT presents to the office today for chronic follow up. Pt states her mother and father passed away 01-Oct-2018. She states she is anxious, but doing well. She currently living with her daughter.    She has osteoporosis and her last dexa scan was 10/01/21. She is taking Fosamax 70 mg weekly.   She is doing much better from left hip fracture.   She quit smoking two weeks ago.   Hypertension This is a chronic problem. The current episode started more than 1 year ago. The problem has been resolved since onset. The problem is controlled. Associated symptoms include anxiety. Pertinent negatives include no malaise/fatigue, peripheral edema or shortness of breath. Risk factors for coronary artery disease include dyslipidemia and sedentary lifestyle. The current treatment provides moderate improvement.  Anxiety Presents for follow-up visit. Symptoms include depressed mood, excessive worry, nervous/anxious behavior and restlessness. Patient reports no irritability or shortness of breath. The severity of symptoms is moderate.   Her past medical history is significant for anemia.  Hyperlipidemia This is a chronic problem. The current episode started more than 1 year ago. The problem is controlled. Recent lipid tests were reviewed and are normal. Pertinent negatives include no shortness of breath. Current antihyperlipidemic treatment includes statins. The current treatment provides moderate improvement of lipids. Risk factors for coronary artery disease include dyslipidemia, hypertension and a sedentary lifestyle.  Back Pain This is a chronic problem. The current episode started more than 1 year ago. The pain is present in the lumbar spine and thoracic spine. The quality of the pain is described as aching. The pain is at a severity  of 5/10. The pain is moderate. She has tried bed rest for the symptoms.  Anemia Presents for follow-up visit. There has been no malaise/fatigue.  Constipation This is a chronic problem. The current episode started more than 1 year ago. The problem has been waxing and waning since onset. Her stool frequency is 1 time per day. Associated symptoms include back pain. She has tried stool softeners and diet changes for the symptoms. The treatment provided mild relief.      Review of Systems  Constitutional:  Negative for irritability and malaise/fatigue.  Respiratory:  Negative for shortness of breath.   Gastrointestinal:  Positive for constipation.  Musculoskeletal:  Positive for back pain.  Psychiatric/Behavioral:  The patient is nervous/anxious.   All other systems reviewed and are negative.      Objective:   Physical Exam Vitals reviewed.  Constitutional:      General: She is not in acute distress.    Appearance: She is well-developed.  HENT:     Head: Normocephalic and atraumatic.     Right Ear: Tympanic membrane normal.     Left Ear: Tympanic membrane normal.  Eyes:     Pupils: Pupils are equal, round, and reactive to light.  Neck:     Thyroid: No thyromegaly.  Cardiovascular:     Rate and Rhythm: Normal rate and regular rhythm.     Heart sounds: Normal heart sounds. No murmur heard. Pulmonary:     Effort: Pulmonary effort is normal. No respiratory distress.     Breath sounds: Normal breath sounds. No wheezing.  Abdominal:     General: Bowel sounds are normal. There is no distension.  Palpations: Abdomen is soft.     Tenderness: There is no abdominal tenderness.  Musculoskeletal:        General: No tenderness. Normal range of motion.     Cervical back: Normal range of motion and neck supple.  Skin:    General: Skin is warm and dry.  Neurological:     Mental Status: She is alert and oriented to person, place, and time.     Cranial Nerves: No cranial nerve deficit.      Deep Tendon Reflexes: Reflexes are normal and symmetric.  Psychiatric:        Behavior: Behavior normal.        Thought Content: Thought content normal.        Judgment: Judgment normal.          BP 138/85   Pulse 88   Temp (!) 97.1 F (36.2 C) (Temporal)   Ht 5' 5.89" (1.674 m)   Wt 121 lb (54.9 kg)   SpO2 100%   BMI 19.60 kg/m   Assessment & Plan:   REY FORS comes in today with chief complaint of Medical Management of Chronic Issues   Diagnosis and orders addressed:  1. Iron deficiency anemia, unspecified iron deficiency anemia type - CMP14+EGFR - CBC with Differential/Platelet  2. Anxiety with depression - DULoxetine (CYMBALTA) 60 MG capsule; Take 1 capsule (60 mg total) by mouth daily.  Dispense: 90 capsule; Refill: 3 - CMP14+EGFR - CBC with Differential/Platelet  3. Chronic bilateral low back pain without sciatica - CMP14+EGFR - CBC with Differential/Platelet  4. Constipation, unspecified constipation type Start Miralax as needed  - CMP14+EGFR - CBC with Differential/Platelet  5. Essential hypertension, benign - CMP14+EGFR - CBC with Differential/Platelet  6. GAD (generalized anxiety disorder)  - DULoxetine (CYMBALTA) 60 MG capsule; Take 1 capsule (60 mg total) by mouth daily.  Dispense: 90 capsule; Refill: 3 - hydrOXYzine (VISTARIL) 25 MG capsule; Take 1 capsule (25 mg total) by mouth every 6 (six) hours as needed.  Dispense: 270 capsule; Refill: 1 - CMP14+EGFR - CBC with Differential/Platelet  7. Mixed hyperlipidemia - simvastatin (ZOCOR) 20 MG tablet; TAKE 1 TABLET BY MOUTH DAILY AT 6 PM.  Dispense: 90 tablet; Refill: 0 - CMP14+EGFR - CBC with Differential/Platelet  8. Other osteoporosis, unspecified pathological fracture presence  - alendronate (FOSAMAX) 70 MG tablet; Take 1 tablet (70 mg total) by mouth once a week. Take with a full glass of water on an empty stomach.  Dispense: 12 tablet; Refill: 1 - CMP14+EGFR - CBC with  Differential/Platelet  9. Tobacco abuse  - CMP14+EGFR - CBC with Differential/Platelet  10. H/O fracture of left hip  - CMP14+EGFR - CBC with Differential/Platelet  11. Chronic right-sided low back pain with right-sided sciatica - cyclobenzaprine (FLEXERIL) 5 MG tablet; TAKE 1 TABLET BY MOUTH 2 TIMES DAILY AS NEEDED FOR MUSCLE SPASMS.  Dispense: 60 tablet; Refill: 2 - CMP14+EGFR - CBC with Differential/Platelet  12. Screening for lung cancer - Ambulatory Referral Lung Cancer Screening [facility]   Labs pending Health Maintenance reviewed Diet and exercise encouraged  Follow up plan: 4 months    Jannifer Rodney, FNP

## 2022-10-10 LAB — CBC WITH DIFFERENTIAL/PLATELET
Basophils Absolute: 0 10*3/uL (ref 0.0–0.2)
Basos: 0 %
EOS (ABSOLUTE): 0.2 10*3/uL (ref 0.0–0.4)
Eos: 2 %
Hematocrit: 39.1 % (ref 34.0–46.6)
Hemoglobin: 13.1 g/dL (ref 11.1–15.9)
Immature Grans (Abs): 0 10*3/uL (ref 0.0–0.1)
Immature Granulocytes: 0 %
Lymphocytes Absolute: 2.7 10*3/uL (ref 0.7–3.1)
Lymphs: 37 %
MCH: 30 pg (ref 26.6–33.0)
MCHC: 33.5 g/dL (ref 31.5–35.7)
MCV: 90 fL (ref 79–97)
Monocytes Absolute: 1 10*3/uL — ABNORMAL HIGH (ref 0.1–0.9)
Monocytes: 13 %
Neutrophils Absolute: 3.5 10*3/uL (ref 1.4–7.0)
Neutrophils: 48 %
Platelets: 425 10*3/uL (ref 150–450)
RBC: 4.36 x10E6/uL (ref 3.77–5.28)
RDW: 12.7 % (ref 11.7–15.4)
WBC: 7.3 10*3/uL (ref 3.4–10.8)

## 2022-10-10 LAB — CMP14+EGFR
ALT: 20 IU/L (ref 0–32)
AST: 22 IU/L (ref 0–40)
Albumin/Globulin Ratio: 1.6 (ref 1.2–2.2)
Albumin: 4.3 g/dL (ref 3.8–4.9)
Alkaline Phosphatase: 84 IU/L (ref 44–121)
BUN/Creatinine Ratio: 14 (ref 9–23)
BUN: 10 mg/dL (ref 6–24)
Bilirubin Total: <0.2 mg/dL (ref 0.0–1.2)
CO2: 24 mmol/L (ref 20–29)
Calcium: 9.7 mg/dL (ref 8.7–10.2)
Chloride: 96 mmol/L (ref 96–106)
Creatinine, Ser: 0.73 mg/dL (ref 0.57–1.00)
Globulin, Total: 2.7 g/dL (ref 1.5–4.5)
Glucose: 93 mg/dL (ref 70–99)
Potassium: 4.3 mmol/L (ref 3.5–5.2)
Sodium: 137 mmol/L (ref 134–144)
Total Protein: 7 g/dL (ref 6.0–8.5)
eGFR: 96 mL/min/{1.73_m2} (ref >59)

## 2022-11-06 ENCOUNTER — Other Ambulatory Visit: Payer: Self-pay | Admitting: Family

## 2022-12-21 ENCOUNTER — Other Ambulatory Visit: Payer: Self-pay | Admitting: Family

## 2023-01-04 ENCOUNTER — Other Ambulatory Visit: Payer: Self-pay | Admitting: Family Medicine

## 2023-01-05 ENCOUNTER — Other Ambulatory Visit: Payer: Self-pay | Admitting: Family

## 2023-01-05 DIAGNOSIS — F411 Generalized anxiety disorder: Secondary | ICD-10-CM

## 2023-02-03 ENCOUNTER — Other Ambulatory Visit: Payer: Self-pay | Admitting: Family Medicine

## 2023-02-05 ENCOUNTER — Encounter: Payer: Self-pay | Admitting: *Deleted

## 2023-02-09 ENCOUNTER — Other Ambulatory Visit: Payer: Self-pay | Admitting: Family

## 2023-02-27 DIAGNOSIS — H5213 Myopia, bilateral: Secondary | ICD-10-CM | POA: Diagnosis not present

## 2023-03-13 ENCOUNTER — Other Ambulatory Visit: Payer: Self-pay | Admitting: Family

## 2023-03-13 DIAGNOSIS — Z1231 Encounter for screening mammogram for malignant neoplasm of breast: Secondary | ICD-10-CM

## 2023-04-03 ENCOUNTER — Encounter: Payer: Self-pay | Admitting: Family

## 2023-04-03 ENCOUNTER — Ambulatory Visit: Payer: Medicaid Other | Admitting: Family

## 2023-04-03 VITALS — BP 130/83 | HR 83 | Temp 98.2°F | Ht 65.0 in | Wt 128.6 lb

## 2023-04-03 DIAGNOSIS — K5900 Constipation, unspecified: Secondary | ICD-10-CM

## 2023-04-03 DIAGNOSIS — M545 Low back pain, unspecified: Secondary | ICD-10-CM

## 2023-04-03 DIAGNOSIS — Z23 Encounter for immunization: Secondary | ICD-10-CM | POA: Diagnosis not present

## 2023-04-03 DIAGNOSIS — Z720 Tobacco use: Secondary | ICD-10-CM

## 2023-04-03 DIAGNOSIS — E782 Mixed hyperlipidemia: Secondary | ICD-10-CM

## 2023-04-03 DIAGNOSIS — Z0001 Encounter for general adult medical examination with abnormal findings: Secondary | ICD-10-CM

## 2023-04-03 DIAGNOSIS — M818 Other osteoporosis without current pathological fracture: Secondary | ICD-10-CM | POA: Diagnosis not present

## 2023-04-03 DIAGNOSIS — Z8781 Personal history of (healed) traumatic fracture: Secondary | ICD-10-CM

## 2023-04-03 DIAGNOSIS — F320 Major depressive disorder, single episode, mild: Secondary | ICD-10-CM

## 2023-04-03 DIAGNOSIS — I10 Essential (primary) hypertension: Secondary | ICD-10-CM

## 2023-04-03 DIAGNOSIS — F411 Generalized anxiety disorder: Secondary | ICD-10-CM

## 2023-04-03 DIAGNOSIS — G8929 Other chronic pain: Secondary | ICD-10-CM

## 2023-04-03 DIAGNOSIS — D509 Iron deficiency anemia, unspecified: Secondary | ICD-10-CM | POA: Diagnosis not present

## 2023-04-03 DIAGNOSIS — Z0000 Encounter for general adult medical examination without abnormal findings: Secondary | ICD-10-CM

## 2023-04-03 MED ORDER — CELECOXIB 200 MG PO CAPS: 200.0000 mg | ORAL_CAPSULE | Freq: Two times a day (BID) | ORAL | 2 refills | Status: DC

## 2023-04-03 NOTE — Patient Instructions (Signed)

## 2023-04-03 NOTE — Progress Notes (Signed)
Subjective:    Patient ID: [name], female    DOB: Jul 25, 1965, 57 y.o.   MRN: [id number]  Chief Complaint  Patient presents with   Medical Management of Chronic Issues    6 month    PT presents to the office today for CPE and  chronic follow up. Pt states her mother and father passed away October 08, 2018. She states she is anxious, but doing well. She currently living with her daughter.    She has osteoporosis and her last dexa scan was 10/01/21. She is taking Fosamax 70 mg weekly.    She has hx of left hip fracture.   Hypertension This is a chronic problem. The current episode started more than 1 year ago. The problem has been resolved since onset. The problem is controlled. Associated symptoms include anxiety. Pertinent negatives include no malaise/fatigue, peripheral edema or shortness of breath. Risk factors for coronary artery disease include dyslipidemia and sedentary lifestyle. The current treatment provides moderate improvement.  Anxiety Presents for follow-up visit. Symptoms include excessive worry, nervous/anxious behavior and restlessness. Patient reports no shortness of breath. Symptoms occur occasionally. The severity of symptoms is moderate.    Depression        This is a chronic problem.  The current episode started more than 1 year ago.   The problem occurs intermittently.  Associated symptoms include restlessness.  Associated symptoms include no helplessness, no hopelessness and not sad.  Past treatments include SNRIs - Serotonin and norepinephrine reuptake inhibitors.  Past medical history includes anxiety.   Hyperlipidemia This is a chronic problem. The current episode started more than 1 year ago. The problem is controlled. Recent lipid tests were reviewed and are normal. Pertinent negatives include no shortness of breath. Current antihyperlipidemic treatment includes statins. The current treatment provides moderate improvement of lipids. Risk factors for coronary artery  disease include hypertension, a sedentary lifestyle and dyslipidemia.  Constipation This is a chronic problem. The current episode started more than 1 year ago. The problem has been gradually improving since onset. Her stool frequency is 2 to 3 times per week. Associated symptoms include back pain. She has tried laxatives for the symptoms. The treatment provided moderate relief.  Back Pain This is a chronic problem. The current episode started more than 1 year ago. The problem occurs intermittently. The problem has been waxing and waning since onset. The pain is present in the lumbar spine. The quality of the pain is described as aching. The pain is at a severity of 10/10. The pain is moderate. She has tried NSAIDs for the symptoms. The treatment provided mild relief.      Review of Systems  Constitutional:  Negative for malaise/fatigue.  Respiratory:  Negative for shortness of breath.   Gastrointestinal:  Positive for constipation.  Musculoskeletal:  Positive for back pain.  Psychiatric/Behavioral:  Positive for depression. The patient is nervous/anxious.   All other systems reviewed and are negative.  Family History  Problem Relation Age of Onset   COPD Mother    Osteoporosis Mother    COPD Father    Osteoporosis Father    Colon cancer Neg Hx    Breast cancer Neg Hx    Social History   Socioeconomic History   Marital status: Divorced    Spouse name: Not on file   Number of children: Not on file   Years of education: Not on file   Highest education level: Not on file  Occupational History   Not on file  Tobacco Use   Smoking status: Former    Current packs/day: 0.00    Average packs/day: 1 pack/day for 29.0 years (29.0 ttl pk-yrs)    Types: Cigarettes    Start date: 09/24/1993    Quit date: 09/25/2022    Years since quitting: 0.5   Smokeless tobacco: Never  Vaping Use   Vaping status: Never Used  Substance and Sexual Activity   Alcohol use: No    Alcohol/week: 0.0  standard drinks of alcohol   Drug use: No   Sexual activity: Not on file  Other Topics Concern   Not on file  Social History Narrative   Not on file   Social Determinants of Health   Financial Resource Strain: Not on file  Food Insecurity: No Food Insecurity (06/23/2022)   Hunger Vital Sign    Worried About Running Out of Food in the Last Year: Never true    Ran Out of Food in the Last Year: Never true  Transportation Needs: No Transportation Needs (06/23/2022)   PRAPARE - Administrator, Civil Service (Medical): No    Lack of Transportation (Non-Medical): No  Physical Activity: Not on file  Stress: Not on file  Social Connections: Unknown (11/04/2021)   Received from [facility]   Social Network    Social Network: Not on file       Objective:   Physical Exam Vitals reviewed.  Constitutional:      General: She is not in acute distress.    Appearance: She is well-developed.  HENT:     Head: Normocephalic and atraumatic.     Right Ear: Tympanic membrane normal.     Left Ear: Tympanic membrane normal.  Eyes:     Pupils: Pupils are equal, round, and reactive to light.  Neck:     Thyroid: No thyromegaly.  Cardiovascular:     Rate and Rhythm: Normal rate and regular rhythm.     Heart sounds: Normal heart sounds. No murmur heard. Pulmonary:     Effort: Pulmonary effort is normal. No respiratory distress.     Breath sounds: Normal breath sounds. No wheezing.  Abdominal:     General: Bowel sounds are normal. There is no distension.     Palpations: Abdomen is soft.     Tenderness: There is no abdominal tenderness.  Musculoskeletal:        General: No tenderness. Normal range of motion.     Cervical back: Normal range of motion and neck supple.  Skin:    General: Skin is warm and dry.  Neurological:     Mental Status: She is alert and oriented to person, place, and time.     Cranial Nerves: No cranial nerve deficit.     Deep Tendon Reflexes:  Reflexes are normal and symmetric.  Psychiatric:        Behavior: Behavior normal.        Thought Content: Thought content normal.        Judgment: Judgment normal.      BP 130/83   Pulse 83   Temp 98.2 F (36.8 C) (Temporal)   Ht 5\' 5"  (1.651 m)   Wt 128 lb 9.6 oz (58.3 kg)   SpO2 96%   BMI 21.40 kg/m      Assessment & Plan:   [name] comes in today with chief complaint of Medical Management of Chronic Issues (6 month)   Diagnosis and orders addressed:  1. Annual physical exam - CBC  with Differential/Platelet - CMP14+EGFR - Lipid panel  2. Iron deficiency anemia, unspecified iron deficiency anemia type - CMP14+EGFR  3. Depression, major, single episode, mild (HCC) - CMP14+EGFR  4. Chronic bilateral low back pain without sciatica - CMP14+EGFR - celecoxib (CELEBREX) 200 MG capsule; Take 1 capsule (200 mg total) by mouth 2 (two) times daily.  Dispense: 180 capsule; Refill: 2  5. Constipation, unspecified constipation type - CMP14+EGFR  6. Essential hypertension, benign - CMP14+EGFR  7. GAD (generalized anxiety disorder) - CMP14+EGFR  8. Mixed hyperlipidemia - CMP14+EGFR - Lipid panel  9. Tobacco abuse - CMP14+EGFR  10. Other osteoporosis, unspecified pathological fracture presence - CMP14+EGFR  11. H/O fracture of left hip - CMP14+EGFR  12. Encounter for immunization - Flu vaccine trivalent PF, 6mos and older(Flulaval,Afluria,Fluarix,Fluzone)   Labs pending Will stop Ibuprofen and start Celebrex 200 mg BID, no other NSAIDs Health Maintenance reviewed Diet and exercise encouraged  Follow up plan: 6 months   Jannifer Rodney, FNP

## 2023-04-06 ENCOUNTER — Other Ambulatory Visit: Payer: Self-pay | Admitting: Family

## 2023-04-06 ENCOUNTER — Telehealth: Payer: Self-pay | Admitting: Family

## 2023-04-06 ENCOUNTER — Ambulatory Visit (INDEPENDENT_AMBULATORY_CARE_PROVIDER_SITE_OTHER): Payer: Medicaid Other

## 2023-04-06 ENCOUNTER — Other Ambulatory Visit: Payer: Self-pay

## 2023-04-06 ENCOUNTER — Other Ambulatory Visit: Payer: Medicaid Other

## 2023-04-06 DIAGNOSIS — Z720 Tobacco use: Secondary | ICD-10-CM

## 2023-04-06 DIAGNOSIS — D509 Iron deficiency anemia, unspecified: Secondary | ICD-10-CM | POA: Diagnosis not present

## 2023-04-06 DIAGNOSIS — Z0000 Encounter for general adult medical examination without abnormal findings: Secondary | ICD-10-CM | POA: Diagnosis not present

## 2023-04-06 DIAGNOSIS — G8929 Other chronic pain: Secondary | ICD-10-CM

## 2023-04-06 DIAGNOSIS — K5900 Constipation, unspecified: Secondary | ICD-10-CM | POA: Diagnosis not present

## 2023-04-06 DIAGNOSIS — M818 Other osteoporosis without current pathological fracture: Secondary | ICD-10-CM | POA: Diagnosis not present

## 2023-04-06 DIAGNOSIS — F411 Generalized anxiety disorder: Secondary | ICD-10-CM | POA: Diagnosis not present

## 2023-04-06 DIAGNOSIS — Z8781 Personal history of (healed) traumatic fracture: Secondary | ICD-10-CM | POA: Diagnosis not present

## 2023-04-06 DIAGNOSIS — E782 Mixed hyperlipidemia: Secondary | ICD-10-CM | POA: Diagnosis not present

## 2023-04-06 DIAGNOSIS — M545 Low back pain, unspecified: Secondary | ICD-10-CM | POA: Diagnosis not present

## 2023-04-06 DIAGNOSIS — S22000D Wedge compression fracture of unspecified thoracic vertebra, subsequent encounter for fracture with routine healing: Secondary | ICD-10-CM

## 2023-04-06 DIAGNOSIS — F320 Major depressive disorder, single episode, mild: Secondary | ICD-10-CM | POA: Diagnosis not present

## 2023-04-06 DIAGNOSIS — I10 Essential (primary) hypertension: Secondary | ICD-10-CM | POA: Diagnosis not present

## 2023-04-06 NOTE — Telephone Encounter (Signed)
This was discontinued at last visit. Do you want her to keep taking/

## 2023-04-06 NOTE — Telephone Encounter (Signed)
Chest x-ray Prescription sent to pharmacy

## 2023-04-07 LAB — CMP14+EGFR
ALT: 14 IU/L (ref 0–32)
AST: 17 IU/L (ref 0–40)
Albumin: 4 g/dL (ref 3.8–4.9)
Alkaline Phosphatase: 70 [IU]/L (ref 44–121)
BUN/Creatinine Ratio: 10 (ref 9–23)
BUN: 7 mg/dL (ref 6–24)
Bilirubin Total: <0.2 mg/dL (ref 0.0–1.2)
CO2: 25 mmol/L (ref 20–29)
Calcium: 9.4 mg/dL (ref 8.7–10.2)
Chloride: 101 mmol/L (ref 96–106)
Creatinine, Ser: 0.72 mg/dL (ref 0.57–1.00)
Globulin, Total: 2.6 g/dL (ref 1.5–4.5)
Glucose: 96 mg/dL (ref 70–99)
Potassium: 4.4 mmol/L (ref 3.5–5.2)
Sodium: 139 mmol/L (ref 134–144)
Total Protein: 6.6 g/dL (ref 6.0–8.5)
eGFR: 97 mL/min/{1.73_m2} (ref >59)

## 2023-04-07 LAB — CBC WITH DIFFERENTIAL/PLATELET
Basos: 1 %
EOS (ABSOLUTE): 0 10*3/uL (ref 0.0–0.2)
Eos: 1 %
Hematocrit: 37.2 % (ref 34.0–46.6)
Hemoglobin: 12.3 g/dL (ref 11.1–15.9)
Immature Granulocytes: 0 %
Immature Granulocytes: 0 10*3/uL (ref 0.0–0.1)
Lymphs: 22 %
MCH: 30.4 pg (ref 26.6–33.0)
MCHC: 33.1 g/dL (ref 31.5–35.7)
MCV: 92 fL (ref 79–97)
Monocytes Absolute: 0.1 10*3/uL (ref 0.0–0.4)
Monocytes Absolute: 0.9 10*3/uL (ref 0.1–0.9)
Monocytes: 12 %
Neutrophils Absolute: 1.5 10*3/uL (ref 0.7–3.1)
Neutrophils Absolute: 4.5 10*3/uL (ref 1.4–7.0)
Neutrophils: 64 %
Platelets: 326 10*3/uL (ref 150–450)
RBC: 4.05 x10E6/uL (ref 3.77–5.28)
RDW: 12.7 % (ref 11.7–15.4)
WBC: 7.1 10*3/uL (ref 3.4–10.8)

## 2023-04-07 LAB — LIPID PANEL
Cholesterol, Total: 183 mg/dL (ref 100–199)
HDL: 50 mg/dL (ref >39)
LDL CALC COMMENT:: 3.7 ratio (ref 0.0–4.4)
LDL Chol Calc (NIH): 106 mg/dL — ABNORMAL HIGH (ref 0–99)
Triglycerides: 157 mg/dL — ABNORMAL HIGH (ref 0–149)
VLDL Cholesterol Cal: 27 mg/dL (ref 5–40)

## 2023-04-19 IMAGING — MG MM DIGITAL SCREENING BILAT W/ TOMO AND CAD
8 series · 9 of 24 positions shown · non-contrast
Comparison: Previous exam(s).

CLINICAL DATA: Screening.

EXAM:
DIGITAL SCREENING BILATERAL MAMMOGRAM WITH TOMOSYNTHESIS AND CAD
TECHNIQUE: Bilateral screening digital craniocaudal and mediolateral oblique
mammograms were obtained. Bilateral screening digital breast
tomosynthesis was performed. The images were evaluated with
computer-aided detection.

[R CC synth-2D]
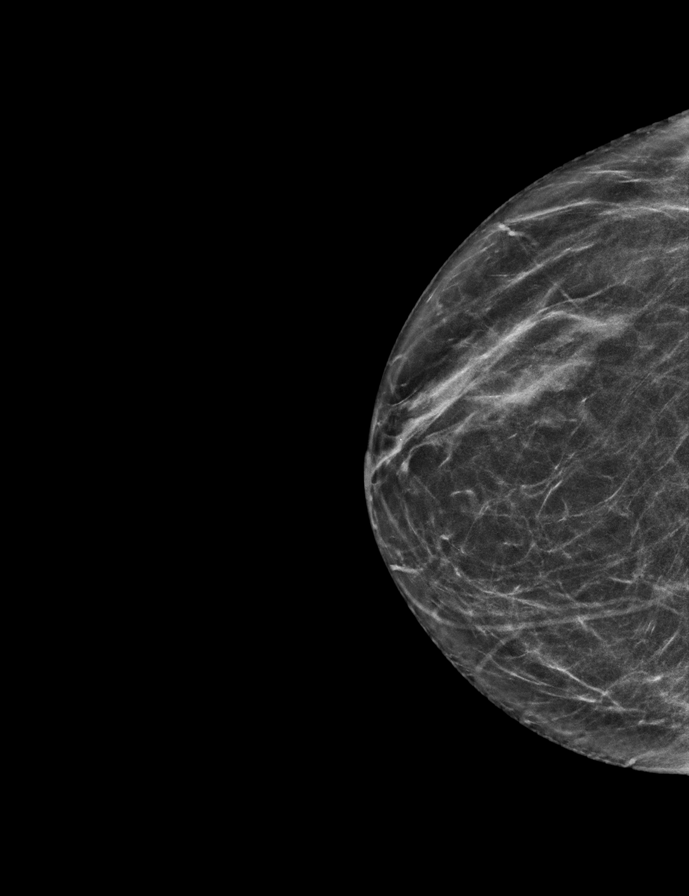

[L CC synth-2D]
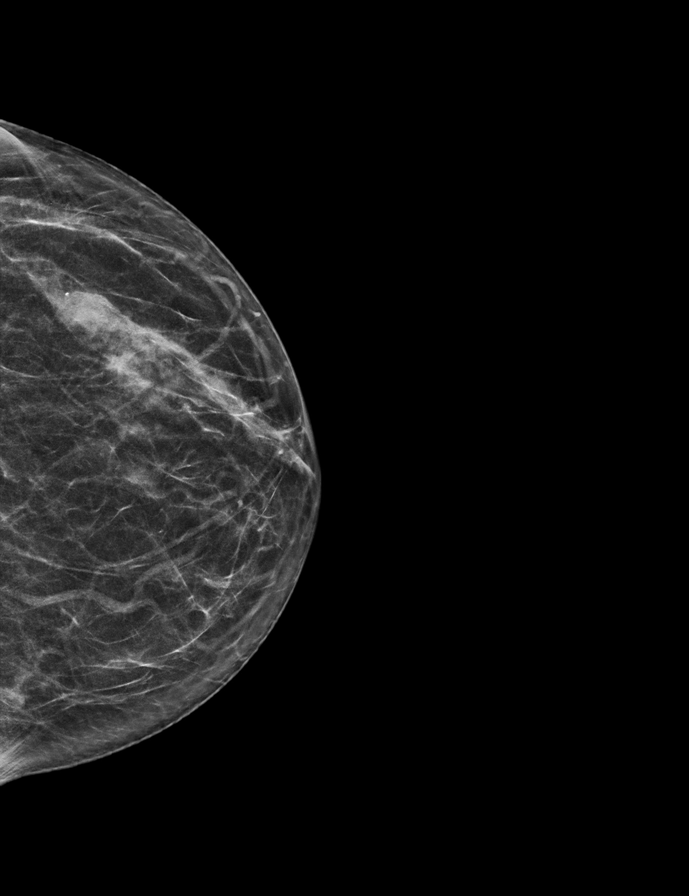

[R MLO synth-2D]
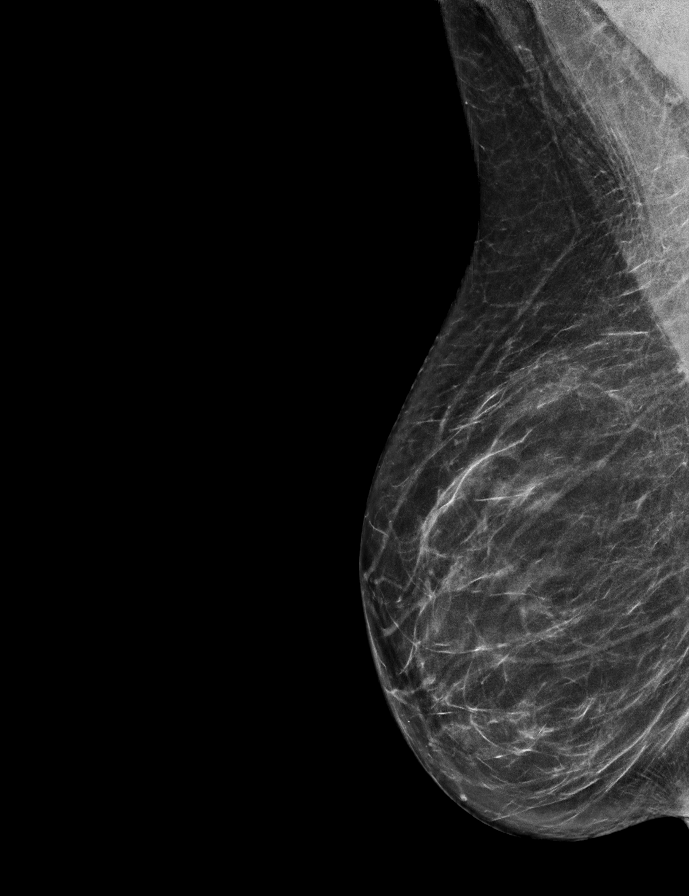

[L MLO synth-2D]
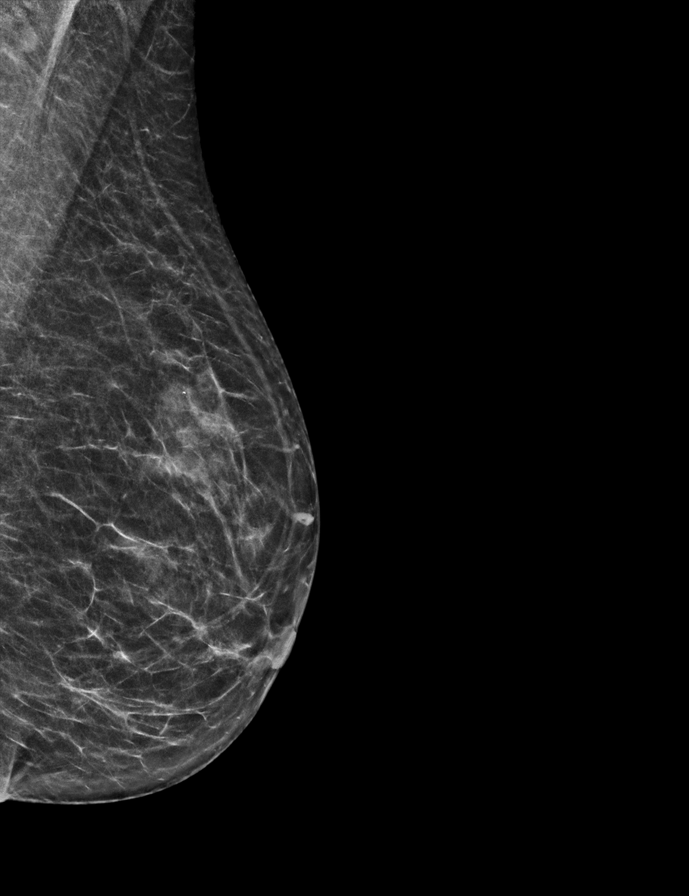

[L MLO tomo · 2 of 51 frames shown]
[frame 17/51]
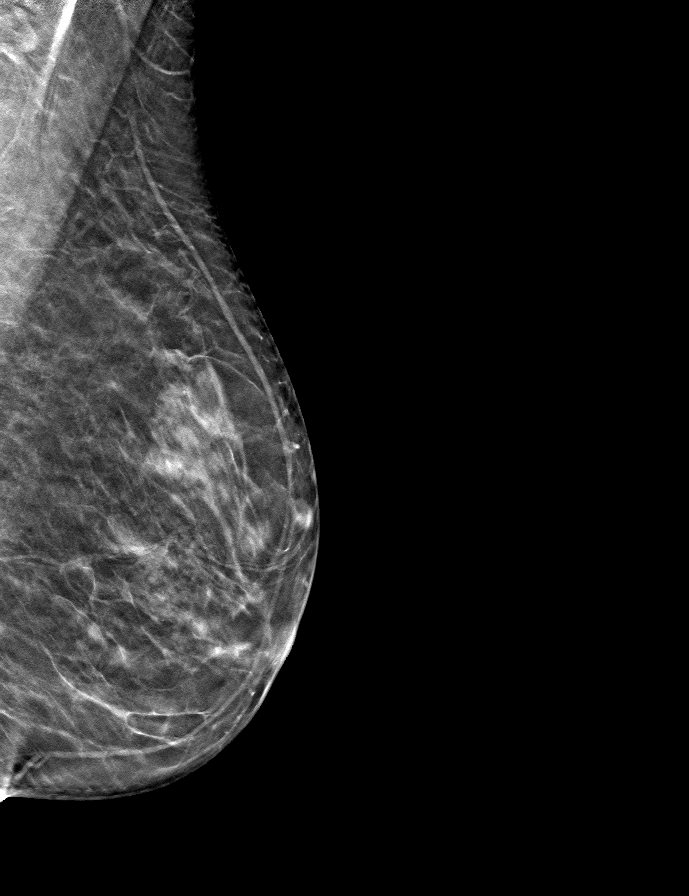
[frame 26/51]
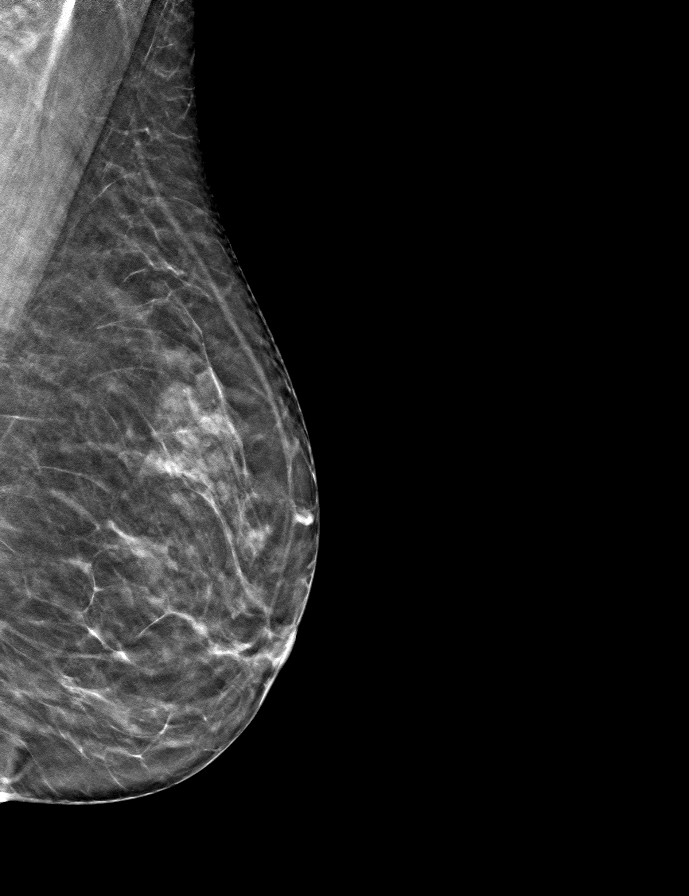

[R MLO tomo · tomo slice 31/62.0]
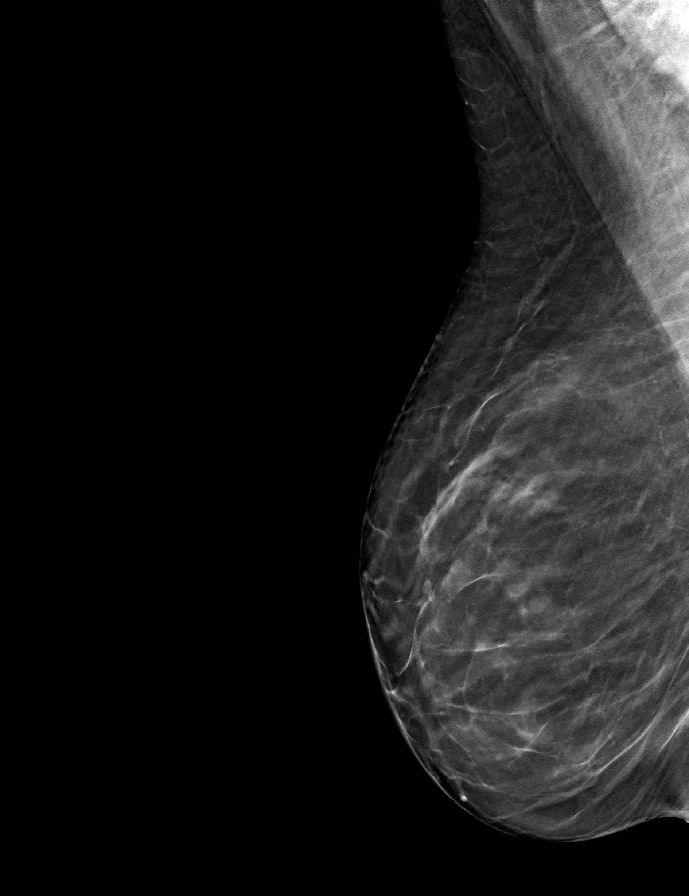

[L CC tomo · tomo slice 28/55.0]
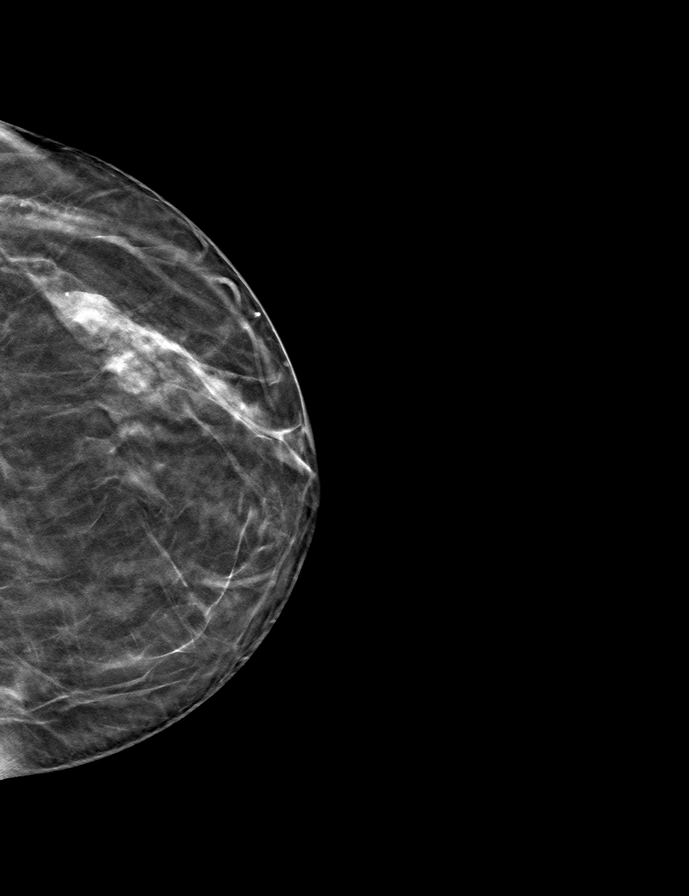

[R CC tomo · tomo slice 29/58.0]
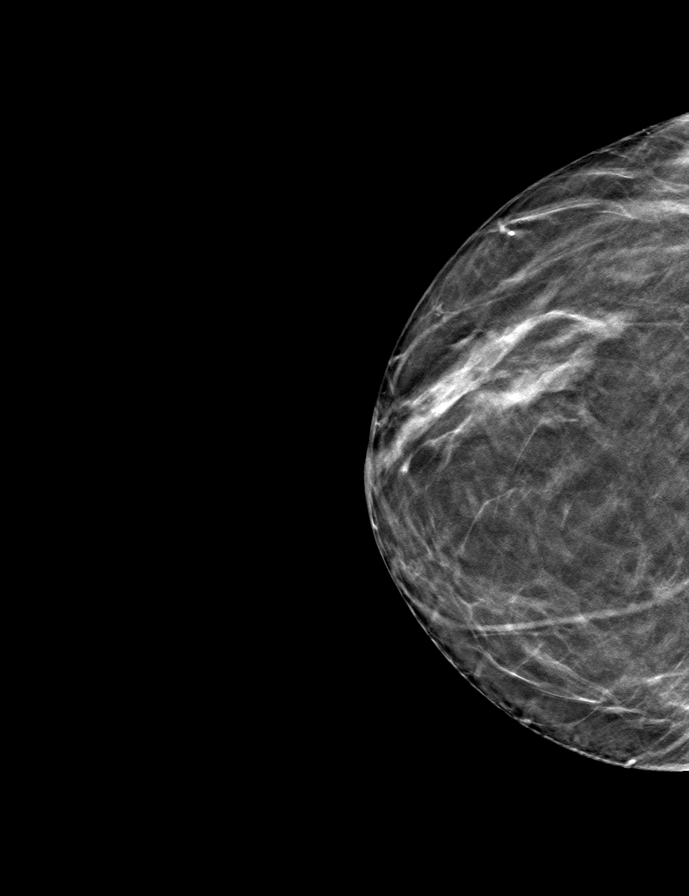

[9 of 24 positions shown; findings below may reference images not displayed]

ACR Breast Density Category b: There are scattered areas of
fibroglandular density.
FINDINGS: There are no findings suspicious for malignancy.
IMPRESSION: No mammographic evidence of malignancy. A result letter of this
screening mammogram will be mailed directly to the patient.

RECOMMENDATION:
Screening mammogram in one year. (Code:51-O-LD2)

BI-RADS CATEGORY  1: Negative.

## 2023-05-11 ENCOUNTER — Other Ambulatory Visit: Payer: Self-pay | Admitting: Family

## 2023-05-25 ENCOUNTER — Telehealth: Payer: Self-pay | Admitting: Family Medicine

## 2023-05-25 MED ORDER — FLUTICASONE PROPIONATE 50 MCG/ACT NA SUSP: NASAL | 2 refills | Status: DC

## 2023-05-25 NOTE — Telephone Encounter (Signed)
Refills sent to the pharmacy.

## 2023-05-25 NOTE — Telephone Encounter (Signed)
Copied from CRM [id number]. Topic: Clinical - Medication Refill >> May 25, 2023 10:52 AM Dondra Prader A wrote: Most Recent Primary Care Visit:  Provider: [facility]  Department: [facility]  Visit Type: LAB  Date: 04/06/2023  Medication: fluticasone (FLONASE) 50 MCG/ACT nasal spray  Has the patient contacted their pharmacy? Yes - Pt stated the pharmacy will not accept request called in.  (Agent: If no, request that the patient contact the pharmacy for the refill. If patient does not wish to contact the pharmacy document the reason why and proceed with request.) (Agent: If yes, when and what did the pharmacy advise?)  Is this the correct pharmacy for this prescription? Yes If no, delete pharmacy and type the correct one.  This is the patient's preferred pharmacy:  [id number] [place],  - [address] [place] [address] Phone: [phone] Fax: [phone]   Has the prescription been filled recently? No  Is the patient out of the medication? Yes  Has the patient been seen for an appointment in the last year OR does the patient have an upcoming appointment? Yes  Can we respond through MyChart? No  Agent: Please be advised that Rx refills may take up to 3 business days. We ask that you follow-up with your pharmacy.

## 2023-06-03 ENCOUNTER — Other Ambulatory Visit: Payer: Self-pay | Admitting: Family

## 2023-06-03 DIAGNOSIS — F411 Generalized anxiety disorder: Secondary | ICD-10-CM

## 2023-06-03 DIAGNOSIS — G8929 Other chronic pain: Secondary | ICD-10-CM

## 2023-06-10 ENCOUNTER — Ambulatory Visit: Payer: Medicaid Other | Admitting: Orthopaedic Surgery

## 2023-06-12 ENCOUNTER — Ambulatory Visit
Admission: RE | Admit: 2023-06-12 | Discharge: 2023-06-12 | Disposition: A | Discharge status: Home / Self Care | Payer: Medicaid Other | Source: Ambulatory Visit | Attending: Family | Admitting: Family

## 2023-06-12 DIAGNOSIS — Z1231 Encounter for screening mammogram for malignant neoplasm of breast: Secondary | ICD-10-CM

## 2023-06-18 ENCOUNTER — Ambulatory Visit: Payer: Medicaid Other | Admitting: Orthopaedic Surgery

## 2023-06-18 ENCOUNTER — Encounter: Payer: Self-pay | Admitting: Orthopaedic Surgery

## 2023-06-18 ENCOUNTER — Other Ambulatory Visit (INDEPENDENT_AMBULATORY_CARE_PROVIDER_SITE_OTHER): Payer: Self-pay

## 2023-06-18 VITALS — Ht 65.0 in | Wt 128.0 lb

## 2023-06-18 DIAGNOSIS — M549 Dorsalgia, unspecified: Secondary | ICD-10-CM | POA: Diagnosis not present

## 2023-06-18 NOTE — Progress Notes (Signed)
Office Visit Note   Patient: [name]           Date of Birth: 02-13-1966           MRN: [id number] Visit Date: 06/18/2023              Requested by: Junie Spencer, FNP [address] [place],  [place] [address] PCP: Junie Spencer, FNP   Assessment & Plan: Visit Diagnoses:  1. Mid back pain          T& compression fracture- old  Plan: Discussed patient she needs to be on calcium.  She states her daughter told her calcium was not good for.  I discussed with the patient she could take Tums 1 twice a day plus vitamin D and needs to take Fosamax with 10 ounces of water once a week sitting up and not taking any other medication for 30 minutes.  Her sister will call her once a week to remind her.  Discussed with her if she takes the Fosamax medication it is not effective and will not help her osteoporosis.  Nothing needs to be done concerning the single thoracic compression and likely occurred at some time when she fell and she certainly send multiple falls.  She can follow-up if needed.  Follow-Up Instructions: No follow-ups on file.   Orders:  Orders Placed This Encounter  Procedures   XR Thoracic Spine 2 View   No orders of the defined types were placed in this encounter.     Procedures: No procedures performed   Clinical Data: No additional findings.   Subjective: Chief Complaint  Patient presents with   Middle Back - Fracture    HPI 57 year old female seen for chest x-ray last month which showed compression fracture of T7.  She has an history of multiple falls she send left total hip arthroplasty January 2024 for femoral neck fracture.  Previous ORIF patella 2016 on the left.  Patient has some thoracolumbar discomfort and some lower lumbar region.  Patient is here with her sister she has had some decreased memory last couple years.  Family history of mother with dementia.  Patient was placed on Fosamax for bone density that showed osteoporosis.  She is not  exactly sure how she takes it.  Review of Systems all systems noncontributory to HPI other than as mentioned above.   Objective: Vital Signs: Ht 5\' 5"  (1.651 m)   Wt 128 lb (58.1 kg)   BMI 21.30 kg/m   Physical Exam Constitutional:      Appearance: She is well-developed.  HENT:     Head: Normocephalic.     Right Ear: External ear normal.     Left Ear: External ear normal. There is no impacted cerumen.  Eyes:     Pupils: Pupils are equal, round, and reactive to light.  Neck:     Thyroid: No thyromegaly.     Trachea: No tracheal deviation.  Cardiovascular:     Rate and Rhythm: Normal rate.  Pulmonary:     Effort: Pulmonary effort is normal.  Abdominal:     Palpations: Abdomen is soft.  Musculoskeletal:     Cervical back: No rigidity.  Skin:    General: Skin is warm and dry.  Neurological:     Mental Status: She is alert and oriented to person, place, and time.  Psychiatric:        Behavior: Behavior normal.     Ortho Exam patient is amatory without a  limp she has some tenderness mid thoracic region.  No scapular winging.  Trace curvature left thoracic right lumbar curve.  Pelvis is level.  Specialty Comments:  No specialty comments available.  Imaging: No results found.   PMFS History: Patient Active Problem List   Diagnosis Date Noted   Depression, major, single episode, mild (HCC) 06/22/2022   Hypokalemia 06/22/2022   Allergic rhinitis 06/22/2022   Chronic back pain 04/21/2017   Tobacco abuse 10/20/2016   Osteoporosis 04/23/2016   Anemia, iron deficiency 10/16/2015   Constipation 10/16/2015   H/O fracture of left hip    GAD (generalized anxiety disorder) 03/06/2014   HLD (hyperlipidemia) 03/06/2014   Essential hypertension, benign 03/06/2014   Past Medical History:  Diagnosis Date   Anemia    Anxiety    Hyperlipidemia    Hypertension    Osteoporosis     Family History  Problem Relation Age of Onset   COPD Mother    Osteoporosis Mother     COPD Father    Osteoporosis Father    Colon cancer Neg Hx    Breast cancer Neg Hx     Past Surgical History:  Procedure Laterality Date   ABDOMINAL HYSTERECTOMY     Partial   HARDWARE REMOVAL Left 06/20/2015   Procedure: HARDWARE REMOVAL;  Surgeon: Vickki Hearing, MD;  Location: [facility];  Service: Orthopedics;  Laterality: Left;  left knee   ORIF PATELLA Left 12/29/2014   Procedure: OPEN REDUCTION INTERNAL FIXATION LEFT PATELLA;  Surgeon: Vickki Hearing, MD;  Location: [facility];  Service: Orthopedics;  Laterality: Left;   TOTAL HIP ARTHROPLASTY Left 06/23/2022   Procedure: TOTAL HIP ARTHROPLASTY ANTERIOR APPROACH;  Surgeon: Samson Frederic, MD;  Location: [facility];  Service: Orthopedics;  Laterality: Left;   Social History   Occupational History   Not on file  Tobacco Use   Smoking status: Former    Current packs/day: 0.00    Average packs/day: 1 pack/day for 29.0 years (29.0 ttl pk-yrs)    Types: Cigarettes    Start date: 09/24/1993    Quit date: 09/25/2022    Years since quitting: 0.7   Smokeless tobacco: Never  Vaping Use   Vaping status: Never Used  Substance and Sexual Activity   Alcohol use: No    Alcohol/week: 0.0 standard drinks of alcohol   Drug use: No   Sexual activity: Not on file

## 2023-06-23 ENCOUNTER — Other Ambulatory Visit: Payer: Self-pay | Admitting: Family

## 2023-06-23 DIAGNOSIS — F411 Generalized anxiety disorder: Secondary | ICD-10-CM

## 2023-07-13 ENCOUNTER — Other Ambulatory Visit: Payer: Self-pay | Admitting: Family

## 2023-07-13 DIAGNOSIS — E782 Mixed hyperlipidemia: Secondary | ICD-10-CM

## 2023-07-24 ENCOUNTER — Other Ambulatory Visit: Payer: Self-pay | Admitting: Family

## 2023-07-24 DIAGNOSIS — E782 Mixed hyperlipidemia: Secondary | ICD-10-CM

## 2023-07-29 ENCOUNTER — Other Ambulatory Visit: Payer: Self-pay | Admitting: Family

## 2023-07-29 DIAGNOSIS — E782 Mixed hyperlipidemia: Secondary | ICD-10-CM

## 2023-07-29 NOTE — Telephone Encounter (Signed)
 Copied from CRM [id number]. Topic: Clinical - Medication Refill >> Jul 29, 2023 10:32 AM Deleta HERO wrote: Most Recent Primary Care Visit:  Provider: [facility]  Department: [facility]  Visit Type: LAB  Date: 04/06/2023  Medication: simvastatin  (ZOCOR ) 20 MG tablet  Has the patient contacted their pharmacy? Yes (Agent: If no, request that the patient contact the pharmacy for the refill. If patient does not wish to contact the pharmacy document the reason why and proceed with request.) (Agent: If yes, when and what did the pharmacy advise?)  Is this the correct pharmacy for this prescription? Yes If no, delete pharmacy and type the correct one.  This is the patient's preferred pharmacy:   [id number] [place] [place] - [address] [place] [address] Phone: [phone] Fax: [phone]   Has the prescription been filled recently? No  Is the patient out of the medication? Yes  Has the patient been seen for an appointment in the last year OR does the patient have an upcoming appointment? Yes  Can we respond through MyChart? No  Agent: Please be advised that Rx refills may take up to 3 business days. We ask that you follow-up with your pharmacy.

## 2023-07-30 MED ORDER — SIMVASTATIN 20 MG PO TABS: 20.0000 mg | ORAL_TABLET | Freq: Every day | ORAL | 0 refills | Status: DC

## 2023-09-17 ENCOUNTER — Encounter: Payer: Self-pay | Admitting: Family Medicine

## 2023-09-17 ENCOUNTER — Ambulatory Visit: Payer: Self-pay | Admitting: *Deleted

## 2023-09-17 ENCOUNTER — Ambulatory Visit (HOSPITAL_COMMUNITY)
Admission: RE | Admit: 2023-09-17 | Discharge: 2023-09-17 | Disposition: A | Discharge status: Home / Self Care | Source: Ambulatory Visit | Attending: Family Medicine | Admitting: Family Medicine

## 2023-09-17 ENCOUNTER — Ambulatory Visit (INDEPENDENT_AMBULATORY_CARE_PROVIDER_SITE_OTHER): Admitting: Family Medicine

## 2023-09-17 VITALS — BP 127/84 | HR 94 | Temp 98.3°F | Ht 65.0 in | Wt 131.0 lb

## 2023-09-17 DIAGNOSIS — R29810 Facial weakness: Secondary | ICD-10-CM

## 2023-09-17 DIAGNOSIS — R2689 Other abnormalities of gait and mobility: Secondary | ICD-10-CM | POA: Diagnosis not present

## 2023-09-17 DIAGNOSIS — R4781 Slurred speech: Secondary | ICD-10-CM | POA: Insufficient documentation

## 2023-09-17 DIAGNOSIS — R519 Headache, unspecified: Secondary | ICD-10-CM | POA: Diagnosis not present

## 2023-09-17 DIAGNOSIS — R930 Abnormal findings on diagnostic imaging of skull and head, not elsewhere classified: Secondary | ICD-10-CM

## 2023-09-17 NOTE — Patient Instructions (Addendum)
 Go for scan at [facility]

## 2023-09-17 NOTE — Progress Notes (Signed)
 Subjective:  Patient ID: [name], female    DOB: 1965-10-08, 58 y.o.   MRN: [id number]  Patient Care Team: Junie Spencer, FNP as PCP - General (Nurse Practitioner)   Chief Complaint:  Headache (X2 days)   HPI: [name] is a 58 y.o. female presenting on 09/17/2023 for Headache (X2 days)   Headache    States that symptoms started Tuesday morning with pain. It hurts on the posterior right side. She describes it as throbbing. It woke her up due to the pain and has not improved. She is taking flexeril, but believes that she is out right now. She took ibuprofen from her sister and it helped a little bit. She is also taking celebrex. States that it hurt to open her mouth wide. She reports that her younger brother recently had stroke.    Relevant past medical, surgical, family, and social history reviewed and updated as indicated.  Allergies and medications reviewed and updated. Data reviewed: Chart in Epic.   Past Medical History:  Diagnosis Date   Anemia    Anxiety    Hyperlipidemia    Hypertension    Osteoporosis     Past Surgical History:  Procedure Laterality Date   ABDOMINAL HYSTERECTOMY     Partial   HARDWARE REMOVAL Left 06/20/2015   Procedure: HARDWARE REMOVAL;  Surgeon: Vickki Hearing, MD;  Location: [facility];  Service: Orthopedics;  Laterality: Left;  left knee   ORIF PATELLA Left 12/29/2014   Procedure: OPEN REDUCTION INTERNAL FIXATION LEFT PATELLA;  Surgeon: Vickki Hearing, MD;  Location: [facility];  Service: Orthopedics;  Laterality: Left;   TOTAL HIP ARTHROPLASTY Left 06/23/2022   Procedure: TOTAL HIP ARTHROPLASTY ANTERIOR APPROACH;  Surgeon: Samson Frederic, MD;  Location: [facility];  Service: Orthopedics;  Laterality: Left;    Social History   Socioeconomic History   Marital status: Divorced    Spouse name: Not on file   Number of children: Not on file   Years of education: Not on file   Highest education level: Not on file  Occupational  History   Not on file  Tobacco Use   Smoking status: Former    Current packs/day: 0.00    Average packs/day: 1 pack/day for 29.0 years (29.0 ttl pk-yrs)    Types: Cigarettes    Start date: 09/24/1993    Quit date: 09/25/2022    Years since quitting: 0.9   Smokeless tobacco: Never  Vaping Use   Vaping status: Never Used  Substance and Sexual Activity   Alcohol use: No    Alcohol/week: 0.0 standard drinks of alcohol   Drug use: No   Sexual activity: Not on file  Other Topics Concern   Not on file  Social History Narrative   Not on file   Social Drivers of Health   Financial Resource Strain: Not on file  Food Insecurity: No Food Insecurity (06/23/2022)   Hunger Vital Sign    Worried About Running Out of Food in the Last Year: Never true    Ran Out of Food in the Last Year: Never true  Transportation Needs: No Transportation Needs (06/23/2022)   PRAPARE - Administrator, Civil Service (Medical): No    Lack of Transportation (Non-Medical): No  Physical Activity: Not on file  Stress: Not on file  Social Connections: Unknown (11/04/2021)   [facility] from [facility]   Social Network    Social Network: Not on file  Intimate Partner Violence: Not At Risk (06/23/2022)   Humiliation, Afraid, Rape, and Kick questionnaire    Fear of Current or Ex-Partner: No    Emotionally Abused: No    Physically Abused: No    Sexually Abused: No    Outpatient Encounter Medications as of 09/17/2023  Medication Sig   alendronate (FOSAMAX) 70 MG tablet Take 1 tablet (70 mg total) by mouth once a week. Take with a full glass of water on an empty stomach.   calcium-vitamin D (OSCAL WITH D) 500-200 MG-UNIT tablet Take 1 tablet by mouth daily.   celecoxib (CELEBREX) 200 MG capsule Take 1 capsule (200 mg total) by mouth 2 (two) times daily.   cyclobenzaprine (FLEXERIL) 5 MG tablet TAKE 1 TABLET BY MOUTH 2 TIMES DAILY AS NEEDED FOR MUSCLE SPASMS.   diphenhydrAMINE (BENADRYL) 25 mg  capsule Take 25 mg by mouth daily.   docusate sodium (COLACE) 100 MG capsule Take 100 mg by mouth daily.   DULoxetine (CYMBALTA) 60 MG capsule Take 1 capsule (60 mg total) by mouth daily.   fexofenadine (ALLEGRA) 180 MG tablet Take 180 mg by mouth daily. OTC   fluticasone (FLONASE) 50 MCG/ACT nasal spray SPRAY 2 SPRAYS INTO EACH NOSTRIL EVERY DAY   folic acid (FOLVITE) 1 MG tablet Take 1 tablet (1 mg total) by mouth daily.   hydrOXYzine (VISTARIL) 25 MG capsule TAKE 1 CAPSULE (25 MG TOTAL) BY MOUTH EVERY 6 (SIX) HOURS AS NEEDED.   nicotine (NICODERM CQ - DOSED IN MG/24 HOURS) 14 mg/24hr patch APPLY 1 PATCH ONTO THE SKIN EVERY DAY   nicotine polacrilex (NICORETTE) 4 MG gum CHEW 1 EACH (4 MG TOTAL) AS DIRECTED BY MOUTH AS NEEDED FOR SMOKING CESSATION.   polyethylene glycol powder (GLYCOLAX/MIRALAX) 17 GM/SCOOP powder Take 17 g by mouth 2 (two) times daily as needed.   Simethicone (GAS-X EXTRA STRENGTH) 125 MG CAPS Take 1 capsule (125 mg total) by mouth daily.   simvastatin (ZOCOR) 20 MG tablet Take 1 tablet (20 mg total) by mouth daily at 6 PM.   No facility-administered encounter medications on file as of 09/17/2023.    Allergies  Allergen Reactions   Codeine Other (See Comments)    Makes pt.have seizure   Hydrocodone     May have caused a seizure per patient reported.    Review of Systems  Neurological:  Positive for headaches.   Objective:  BP 127/84   Pulse 94   Temp 98.3 F (36.8 C)   Ht 5\' 5"  (1.651 m)   Wt 131 lb (59.4 kg)   SpO2 96%   BMI 21.80 kg/m    Wt Readings from Last 3 Encounters:  09/17/23 131 lb (59.4 kg)  06/18/23 128 lb (58.1 kg)  04/03/23 128 lb 9.6 oz (58.3 kg)   Physical Exam Constitutional:      General: She is awake. She is not in acute distress.    Appearance: Normal appearance. She is well-groomed. She is ill-appearing.     Comments: Chronically ill appearing   Eyes:     Extraocular Movements:     Right eye: Normal extraocular motion and no  nystagmus.     Left eye: Normal extraocular motion and no nystagmus.  Cardiovascular:     Rate and Rhythm: Regular rhythm. Tachycardia present.     Heart sounds: Normal heart sounds.  Pulmonary:     Effort: Pulmonary effort is normal.     Breath sounds: Normal breath sounds.  Musculoskeletal:     Right  lower leg: No edema.     Left lower leg: No edema.  Neurological:     Mental Status: She is alert, oriented to person, place, and time and easily aroused.     Cranial Nerves: Dysarthria and facial asymmetry present.     Motor: No weakness, atrophy, abnormal muscle tone, seizure activity or pronator drift.     Gait: Gait abnormal. Tandem walk normal.     Comments: Grip strength equal  Arm strength equal  Speech is slurred  Shuffle gait   Psychiatric:        Behavior: Behavior is cooperative.       Results for orders placed or performed in visit on 04/03/23  CBC with Differential/Platelet   Collection Time: 04/06/23 12:54 PM  Result Value Ref Range   WBC 7.1 3.4 - 10.8 x10E3/uL   RBC 4.05 3.77 - 5.28 x10E6/uL   Hemoglobin 12.3 11.1 - 15.9 g/dL   Hematocrit 13.0 86.5 - 46.6 %   MCV 92 79 - 97 fL   MCH 30.4 26.6 - 33.0 pg   MCHC 33.1 31.5 - 35.7 g/dL   RDW 78.4 69.6 - 29.5 %   Platelets 326 150 - 450 x10E3/uL   Neutrophils 64 Not Estab. %   Lymphs 22 Not Estab. %   Monocytes 12 Not Estab. %   Eos 1 Not Estab. %   Basos 1 Not Estab. %   Neutrophils Absolute 4.5 1.4 - 7.0 x10E3/uL   Lymphocytes Absolute 1.5 0.7 - 3.1 x10E3/uL   Monocytes Absolute 0.9 0.1 - 0.9 x10E3/uL   EOS (ABSOLUTE) 0.1 0.0 - 0.4 x10E3/uL   Basophils Absolute 0.0 0.0 - 0.2 x10E3/uL   Immature Granulocytes 0 Not Estab. %   Immature Grans (Abs) 0.0 0.0 - 0.1 x10E3/uL  CMP14+EGFR   Collection Time: 04/06/23 12:54 PM  Result Value Ref Range   Glucose 96 70 - 99 mg/dL   BUN 7 6 - 24 mg/dL   Creatinine, Ser 2.84 0.57 - 1.00 mg/dL   eGFR 97 >13 KG/MWN/0.27   BUN/Creatinine Ratio 10 9 - 23   Sodium 139  134 - 144 mmol/L   Potassium 4.4 3.5 - 5.2 mmol/L   Chloride 101 96 - 106 mmol/L   CO2 25 20 - 29 mmol/L   Calcium 9.4 8.7 - 10.2 mg/dL   Total Protein 6.6 6.0 - 8.5 g/dL   Albumin 4.0 3.8 - 4.9 g/dL   Globulin, Total 2.6 1.5 - 4.5 g/dL   Bilirubin Total <2.5 0.0 - 1.2 mg/dL   Alkaline Phosphatase 70 44 - 121 IU/L   AST 17 0 - 40 IU/L   ALT 14 0 - 32 IU/L  Lipid panel   Collection Time: 04/06/23 12:54 PM  Result Value Ref Range   Cholesterol, Total 183 100 - 199 mg/dL   Triglycerides 366 (H) 0 - 149 mg/dL   HDL 50 >44 mg/dL   VLDL Cholesterol Cal 27 5 - 40 mg/dL   LDL Chol Calc (NIH) 034 (H) 0 - 99 mg/dL   Chol/HDL Ratio 3.7 0.0 - 4.4 ratio       04/03/2023    3:24 PM 10/09/2022    3:42 PM 07/01/2022    2:57 PM 03/25/2022    3:04 PM 09/19/2021    4:09 PM  Depression screen PHQ 2/9  Decreased Interest 2 0 0 0 0  Down, Depressed, Hopeless 0 0 0 0 0  PHQ - 2 Score 2 0 0 0 0  Altered sleeping  0 0 0 0 0  Tired, decreased energy 2 0 0 3 0  Change in appetite 1 0 0 0 0  Feeling bad or failure about yourself  0 0 0 0 0  Trouble concentrating 2 0 0 2 0  Moving slowly or fidgety/restless 2 0 0 1 0  Suicidal thoughts 0 0 0 0 0  PHQ-9 Score 9 0 0 6 0  Difficult doing work/chores Somewhat difficult Not difficult at all Not difficult at all Somewhat difficult Not difficult at all       04/03/2023    3:24 PM 10/09/2022    3:41 PM 03/25/2022    3:05 PM 12/20/2020    2:53 PM  GAD 7 : Generalized Anxiety Score  Nervous, Anxious, on Edge 1 0 2 1  Control/stop worrying 1 1 1 1   Worry too much - different things 0 0 2 0  Trouble relaxing 0 0 2 0  Restless 1 0 2 1  Easily annoyed or irritable 2 0 2 0  Afraid - awful might happen 2 0 0 0  Total GAD 7 Score 7 1 11 3   Anxiety Difficulty Not difficult at all Not difficult at all Somewhat difficult Somewhat difficult   Pertinent labs & imaging results that were available during my care of the patient were reviewed by me and considered in  my medical decision making.  Assessment & Plan:  [name] was seen today for headache.  Diagnoses and all orders for this visit:  1. Slurred speech (Primary) Given concerning symptoms of slurred speech, headache that woke her up from sleep, and mouth droop, will order stat imaging as below. Discussed with patient at length the importance of completing scan. Patient verbalized understanding and agreed to go for scan. She is reliant on brother and sister for transportation. Brother brought her to appt today. Declined EMS transportation.  - CT HEAD WO CONTRAST ( ); Future  2. Acute intractable headache, unspecified headache type As above.   3. Mouth droop due to facial weakness As above.   4. Shuffling gait As above.    Continue all other maintenance medications.  Follow up plan: Return if symptoms worsen or fail to improve.   Continue healthy lifestyle choices, including diet (rich in fruits, vegetables, and lean proteins, and low in salt and simple carbohydrates) and exercise (at least 30 minutes of moderate physical activity daily).  Written and verbal instructions provided   The above assessment and management plan was discussed with the patient. The patient verbalized understanding of and has agreed to the management plan. Patient is aware to call the clinic if they develop any new symptoms or if symptoms persist or worsen. Patient is aware when to return to the clinic for a follow-up visit. Patient educated on when it is appropriate to go to the emergency department.   Neale Burly, DNP-FNP [facility] [address] [place], [place] [address] [phone]

## 2023-09-17 NOTE — Telephone Encounter (Signed)
  Chief Complaint: worsening headache, shaky Symptoms: worsening headache x 2 days and now feels "shaky" . Headache right side neck feels "wacko" and radiates to shoulders. Sitting up eases headache but comes back. Reports as constant now. Took ibuprofen 800 mg from sister yesterday and helped ease pain approx 4 hours but returned. Can walk but holds to things as needed Frequency: 2 days  Pertinent Negatives: Patient denies blurred vision, no dizziness no N/T no weakness on either side of body  Disposition: [] ED /[] Urgent Care (no appt availability in office) / [x] Appointment(In office/virtual)/ []  [place] [facility]/ [] Home Care/ [] Refused Recommended Disposition /[] [facility]/ []  Follow-up with PCP Additional Notes:   No appt available with PCP today . Scheduled appt with another provider in office today . Recommended if sx worsen or has "worst headache in life" call 911 / go to ED.      Copied from CRM [id number]. Topic: Clinical - Red Word Triage >> Sep 17, 2023 11:30 AM Antwanette L wrote: Red Word that prompted transfer to Nurse Triage:  Patient is  very shaky and is experiencing throbbing headache pain (right side) . Patient said the pain is severe and she can barely move. Reason for Disposition  [1] SEVERE headache (e.g., excruciating) AND [2] not improved after 2 hours of pain medicine  Answer Assessment - Initial Assessment Questions 1. LOCATION: "Where does it hurt?"      Headache right side  2. ONSET: "When did the headache start?" (Minutes, hours or days)      2 days ago  3. PATTERN: "Does the pain come and go, or has it been constant since it started?"     constant 4. SEVERITY: "How bad is the pain?" and "What does it keep you from doing?"  (e.g., Scale 1-10; mild, moderate, or severe)   - MILD (1-3): doesn't interfere with normal activities    - MODERATE (4-7): interferes with normal activities or awakens from sleep    - SEVERE (8-10): excruciating pain,  unable to do any normal activities        Severe 10/10 sitting up helps relieves to 8/10  5. RECURRENT SYMPTOM: "Have you ever had headaches before?" If Yes, ask: "When was the last time?" and "What happened that time?"      Yes  6. CAUSE: "What do you think is causing the headache?"     Not sure  7. MIGRAINE: "Have you been diagnosed with migraine headaches?" If Yes, ask: "Is this headache similar?"      Na  8. HEAD INJURY: "Has there been any recent injury to the head?"      na 9. OTHER SYMPTOMS: "Do you have any other symptoms?" (fever, stiff neck, eye pain, sore throat, cold symptoms)     Neck "wacko " radiates to shoulders  10. PREGNANCY: "Is there any chance you are pregnant?" "When was your last menstrual period?"       Na  Protocols used: Headache-A-AH

## 2023-09-17 NOTE — Telephone Encounter (Signed)
 Appt made

## 2023-09-18 ENCOUNTER — Other Ambulatory Visit: Payer: Self-pay | Admitting: Family

## 2023-09-18 DIAGNOSIS — M545 Low back pain, unspecified: Secondary | ICD-10-CM

## 2023-09-18 DIAGNOSIS — G8929 Other chronic pain: Secondary | ICD-10-CM

## 2023-09-18 NOTE — Telephone Encounter (Signed)
 Copied from CRM [id number]. Topic: Clinical - Prescription Issue >> Sep 18, 2023  2:58 PM Marland Kitchen D wrote: Patient is having trouble getting this refilled-  celecoxib (CELEBREX) 200 MG capsule    Preferred Pharmacy   [phone] [place] [place] [facility] [address] [place] [address] Phone: [phone] Fax: [phone] Hours: Not open 24 hours

## 2023-09-21 ENCOUNTER — Telehealth: Payer: Self-pay

## 2023-09-21 ENCOUNTER — Other Ambulatory Visit: Payer: Self-pay | Admitting: Family

## 2023-09-21 DIAGNOSIS — Z122 Encounter for screening for malignant neoplasm of respiratory organs: Secondary | ICD-10-CM

## 2023-09-21 DIAGNOSIS — Z87891 Personal history of nicotine dependence: Secondary | ICD-10-CM

## 2023-09-21 DIAGNOSIS — F17210 Nicotine dependence, cigarettes, uncomplicated: Secondary | ICD-10-CM

## 2023-09-21 MED ORDER — CELECOXIB 200 MG PO CAPS: 200.0000 mg | ORAL_CAPSULE | Freq: Two times a day (BID) | ORAL | 0 refills | Status: DC

## 2023-09-21 NOTE — Telephone Encounter (Signed)
 Lung Cancer Screening Narrative/Criteria Questionnaire (Cigarette Smokers Only- No Cigars/Pipes/vapes)   Silvio Pate A Willingham   SDMV:10/19/2023 at 4:00 pm with Maralyn Sago        1966/02/20   LDCT: pt will call back and schedule. She is arranging transportation with her sister.     58 y.o.   Phone: [phone]  Lung Screening Narrative (confirm age 42-77 yrs Medicare / 50-80 yrs Private pay insurance)   Insurance information: Medicaid   Referring Provider:[facility]   This screening involves an initial phone call with a team member from our program. It is called a shared decision making visit. The initial meeting is required by  insurance and Medicare to make sure you understand the program. This appointment takes about 15-20 minutes to complete. You will complete the screening scan at your scheduled date/time.  This scan takes about 5-10 minutes to complete. You can eat and drink normally before and after the scan.  Criteria questions for Lung Cancer Screening:   Are you a current or former smoker? Current Age began smoking: 19   If you are a former smoker, what year did you quit smoking? (within 15 yrs)   To calculate your smoking history, I need an accurate estimate of how many packs of cigarettes you smoked per day and for how many years. (Not just the number of PPD you are now smoking)   Years smoking 38 x Packs per day 1.5 = Pack years 57   (at least 20 pack yrs)   (Make sure they understand that we need to know how much they have smoked in the past, not just the number of PPD they are smoking now)  Do you have a personal history of cancer?  No    Do you have a family history of cancer? No  Are you coughing up blood?  No  Have you had unexplained weight loss of 15 lbs or more in the last 6 months? No  It looks like you meet all criteria.  When would be a good time for [place] to schedule you for this screening?   Additional information: N/A

## 2023-09-21 NOTE — Telephone Encounter (Signed)
 Error

## 2023-09-22 ENCOUNTER — Other Ambulatory Visit: Payer: Self-pay | Admitting: Family

## 2023-09-22 DIAGNOSIS — M818 Other osteoporosis without current pathological fracture: Secondary | ICD-10-CM

## 2023-09-22 NOTE — Progress Notes (Signed)
 Will place urgent referral to ENT. Recommend follow up with PCP.

## 2023-09-22 NOTE — Addendum Note (Signed)
 Addended by: Neale Burly on: 09/22/2023 09:15 AM   Modules accepted: Orders

## 2023-10-02 ENCOUNTER — Encounter: Payer: Self-pay | Admitting: Family

## 2023-10-02 ENCOUNTER — Ambulatory Visit: Payer: Medicaid Other | Admitting: Family

## 2023-10-02 VITALS — BP 127/83 | HR 92 | Temp 97.9°F | Ht 65.0 in | Wt 132.0 lb

## 2023-10-02 DIAGNOSIS — K5900 Constipation, unspecified: Secondary | ICD-10-CM | POA: Diagnosis not present

## 2023-10-02 DIAGNOSIS — D509 Iron deficiency anemia, unspecified: Secondary | ICD-10-CM | POA: Diagnosis not present

## 2023-10-02 DIAGNOSIS — M545 Low back pain, unspecified: Secondary | ICD-10-CM | POA: Diagnosis not present

## 2023-10-02 DIAGNOSIS — G8929 Other chronic pain: Secondary | ICD-10-CM

## 2023-10-02 DIAGNOSIS — F320 Major depressive disorder, single episode, mild: Secondary | ICD-10-CM | POA: Diagnosis not present

## 2023-10-02 DIAGNOSIS — M818 Other osteoporosis without current pathological fracture: Secondary | ICD-10-CM | POA: Diagnosis not present

## 2023-10-02 DIAGNOSIS — Z720 Tobacco use: Secondary | ICD-10-CM

## 2023-10-02 DIAGNOSIS — H6993 Unspecified Eustachian tube disorder, bilateral: Secondary | ICD-10-CM | POA: Diagnosis not present

## 2023-10-02 DIAGNOSIS — E782 Mixed hyperlipidemia: Secondary | ICD-10-CM

## 2023-10-02 DIAGNOSIS — F411 Generalized anxiety disorder: Secondary | ICD-10-CM

## 2023-10-02 DIAGNOSIS — I10 Essential (primary) hypertension: Secondary | ICD-10-CM | POA: Diagnosis not present

## 2023-10-02 MED ORDER — DICLOFENAC SODIUM 1 % EX GEL: 2.0000 g | Freq: Four times a day (QID) | CUTANEOUS | 6 refills | Status: AC

## 2023-10-02 MED ORDER — FEXOFENADINE HCL 180 MG PO TABS: 180.0000 mg | ORAL_TABLET | Freq: Every day | ORAL | 1 refills | Status: DC

## 2023-10-02 NOTE — Patient Instructions (Signed)
 Eustachian Tube Dysfunction  Eustachian tube dysfunction refers to a condition in which a blockage develops in the narrow passage that connects the middle ear to the back of the nose (eustachian tube). The eustachian tube regulates air pressure in the middle ear by letting air move between the ear and nose. It also helps to drain fluid from the middle ear space. Eustachian tube dysfunction can affect one or both ears. When the eustachian tube does not function properly, air pressure, fluid, or both can build up in the middle ear. What are the causes? This condition occurs when the eustachian tube becomes blocked or cannot open normally. Common causes of this condition include: Ear infections. Colds and other infections that affect the nose, mouth, and throat (upper respiratory tract). Allergies. Irritation from cigarette smoke. Irritation from stomach acid coming up into the esophagus (gastroesophageal reflux). The esophagus is the part of the body that moves food from the mouth to the stomach. Sudden changes in air pressure, such as from descending in an airplane or scuba diving. Abnormal growths in the nose or throat, such as: Growths that line the nose (nasal polyps). Abnormal growth of cells (tumors). Enlarged tissue at the back of the throat (adenoids). What increases the risk? You are more likely to develop this condition if: You smoke. You are overweight. You are a child who has: Certain birth defects of the mouth, such as cleft palate. Large tonsils or adenoids. What are the signs or symptoms? Common symptoms of this condition include: A feeling of fullness in the ear. Ear pain. Clicking or popping noises in the ear. Ringing in the ear (tinnitus). Hearing loss. Loss of balance. Dizziness. Symptoms may get worse when the air pressure around you changes, such as when you travel to an area of high elevation, fly on an airplane, or go scuba diving. How is this diagnosed? This  condition may be diagnosed based on: Your symptoms. A physical exam of your ears, nose, and throat. Tests, such as those that measure: The movement of your eardrum. Your hearing (audiometry). How is this treated? Treatment depends on the cause and severity of your condition. In mild cases, you may relieve your symptoms by moving air into your ears. This is called "popping the ears." In more severe cases, or if you have symptoms of fluid in your ears, treatment may include: Medicines to relieve congestion (decongestants). Medicines that treat allergies (antihistamines). Nasal sprays or ear drops that contain medicines that reduce swelling (steroids). A procedure to drain the fluid in your eardrum. In this procedure, a small tube may be placed in the eardrum to: Drain the fluid. Restore the air in the middle ear space. A procedure to insert a balloon device through the nose to inflate the opening of the eustachian tube (balloon dilation). Follow these instructions at home: Lifestyle Do not do any of the following until your health care provider approves: Travel to high altitudes. Fly in airplanes. Work in a Estate agent or room. Scuba dive. Do not use any products that contain nicotine or tobacco. These products include cigarettes, chewing tobacco, and vaping devices, such as e-cigarettes. If you need help quitting, ask your health care provider. Keep your ears dry. Wear fitted earplugs during showering and bathing. Dry your ears completely after. General instructions Take over-the-counter and prescription medicines only as told by your health care provider. Use techniques to help pop your ears as recommended by your health care provider. These may include: Chewing gum. Yawning. Frequent, forceful swallowing.  Closing your mouth, holding your nose closed, and gently blowing as if you are trying to blow air out of your nose. Keep all follow-up visits. This is important. Contact a  health care provider if: Your symptoms do not go away after treatment. Your symptoms come back after treatment. You are unable to pop your ears. You have: A fever. Pain in your ear. Pain in your head or neck. Fluid draining from your ear. Your hearing suddenly changes. You become very dizzy. You lose your balance. Get help right away if: You have a sudden, severe increase in any of your symptoms. Summary Eustachian tube dysfunction refers to a condition in which a blockage develops in the eustachian tube. It can be caused by ear infections, allergies, inhaled irritants, or abnormal growths in the nose or throat. Symptoms may include ear pain or fullness, hearing loss, or ringing in the ears. Mild cases are treated with techniques to unblock the ears, such as yawning or chewing gum. More severe cases are treated with medicines or procedures. This information is not intended to replace advice given to you by your health care provider. Make sure you discuss any questions you have with your health care provider. Document Revised: 08/20/2020 Document Reviewed: 08/20/2020 Elsevier Patient Education  2024 [employer].

## 2023-10-02 NOTE — Progress Notes (Signed)
 Subjective:    Patient ID: [name], female    DOB: [birth year], 58 y.o.   MRN: [id number]  Chief Complaint  Patient presents with   Medical Management of Chronic Issues    PT presents to the office today for chronic follow up. Pt states her mother and father passed away 09-06-18. She states she is anxious, but doing well. She currently living with her daughter.    She has osteoporosis and her last dexa scan was 10/01/21. She is taking Fosamax 70 mg weekly.    She has hx of left hip fracture.   Hypertension This is a chronic problem. The current episode started more than 1 year ago. The problem has been resolved since onset. The problem is controlled. Associated symptoms include anxiety and malaise/fatigue. Pertinent negatives include no peripheral edema or shortness of breath. Risk factors for coronary artery disease include dyslipidemia and sedentary lifestyle. The current treatment provides moderate improvement.  Anxiety Presents for follow-up visit. Symptoms include excessive worry, nervous/anxious behavior and restlessness. Patient reports no shortness of breath. Symptoms occur occasionally. The severity of symptoms is moderate.    Depression        This is a chronic problem.  The current episode started more than 1 year ago.   The problem occurs intermittently.  Associated symptoms include restlessness.  Associated symptoms include no helplessness, no hopelessness and not sad.  Past treatments include SNRIs - Serotonin and norepinephrine reuptake inhibitors.  Past medical history includes anxiety.   Hyperlipidemia This is a chronic problem. The current episode started more than 1 year ago. The problem is uncontrolled. Recent lipid tests were reviewed and are high. Pertinent negatives include no shortness of breath. Current antihyperlipidemic treatment includes statins. The current treatment provides moderate improvement of lipids. Risk factors for coronary artery disease include  hypertension, a sedentary lifestyle and dyslipidemia.  Constipation This is a chronic problem. The current episode started more than 1 year ago. The problem has been gradually improving since onset. Her stool frequency is 2 to 3 times per week. Associated symptoms include back pain. She has tried laxatives for the symptoms. The treatment provided moderate relief.  Back Pain This is a chronic problem. The current episode started more than 1 year ago. The problem occurs intermittently. The problem has been waxing and waning since onset. The pain is present in the lumbar spine. The quality of the pain is described as aching. The pain is at a severity of 7/10. The pain is moderate. The symptoms are aggravated by standing and twisting. She has tried NSAIDs for the symptoms. The treatment provided mild relief.      Review of Systems  Constitutional:  Positive for malaise/fatigue.  Respiratory:  Negative for shortness of breath.   Gastrointestinal:  Positive for constipation.  Musculoskeletal:  Positive for back pain.  Psychiatric/Behavioral:  The patient is nervous/anxious.   All other systems reviewed and are negative.  Family History  Problem Relation Age of Onset   COPD Mother    Osteoporosis Mother    COPD Father    Osteoporosis Father    Colon cancer Neg Hx    Breast cancer Neg Hx    Social History   Socioeconomic History   Marital status: Divorced    Spouse name: Not on file   Number of children: Not on file   Years of education: Not on file   Highest education level: Not on file  Occupational History   Not on file  Tobacco  Use   Smoking status: Former    Current packs/day: 0.00    Average packs/day: 1 pack/day for 29.0 years (29.0 ttl pk-yrs)    Types: Cigarettes    Start date: 09/24/1993    Quit date: 09/25/2022    Years since quitting: 1.0   Smokeless tobacco: Never  Vaping Use   Vaping status: Never Used  Substance and Sexual Activity   Alcohol use: No     Alcohol/week: 0.0 standard drinks of alcohol   Drug use: No   Sexual activity: Not on file  Other Topics Concern   Not on file  Social History Narrative   Not on file   Social Drivers of Health   Financial Resource Strain: Not on file  Food Insecurity: No Food Insecurity (06/23/2022)   Hunger Vital Sign    Worried About Running Out of Food in the Last Year: Never true    Ran Out of Food in the Last Year: Never true  Transportation Needs: No Transportation Needs (06/23/2022)   PRAPARE - Administrator, Civil Service (Medical): No    Lack of Transportation (Non-Medical): No  Physical Activity: Not on file  Stress: Not on file  Social Connections: Unknown (11/04/2021)   [facility]   Social Network    Social Network: Not on file       Objective:   Physical Exam Vitals reviewed.  Constitutional:      General: She is not in acute distress.    Appearance: She is well-developed.  HENT:     Head: Normocephalic and atraumatic.     Right Ear: A middle ear effusion is present.     Left Ear: A middle ear effusion is present.  Eyes:     Pupils: Pupils are equal, round, and reactive to light.  Neck:     Thyroid: No thyromegaly.  Cardiovascular:     Rate and Rhythm: Normal rate and regular rhythm.     Heart sounds: Normal heart sounds. No murmur heard. Pulmonary:     Effort: Pulmonary effort is normal. No respiratory distress.     Breath sounds: Normal breath sounds. No wheezing.  Abdominal:     General: Bowel sounds are normal. There is no distension.     Palpations: Abdomen is soft.     Tenderness: There is no abdominal tenderness.  Musculoskeletal:        General: No tenderness. Normal range of motion.     Cervical back: Normal range of motion and neck supple.  Skin:    General: Skin is warm and dry.  Neurological:     Mental Status: She is alert and oriented to person, place, and time.     Cranial Nerves: No cranial nerve deficit.      Deep Tendon Reflexes: Reflexes are normal and symmetric.  Psychiatric:        Behavior: Behavior normal.        Thought Content: Thought content normal.        Judgment: Judgment normal.      BP 127/83   Pulse 92   Temp 97.9 F (36.6 C)   Ht 5\' 5"  (1.651 m)   Wt 132 lb (59.9 kg)   SpO2 97%   BMI 21.97 kg/m      Assessment & Plan:   [name] comes in today with chief complaint of Medical Management of Chronic Issues   Diagnosis and orders addressed:  1. GAD (generalized anxiety disorder) (Primary) -  CMP14+EGFR  2. Mixed hyperlipidemia - CMP14+EGFR  3. Essential hypertension, benign - CMP14+EGFR  4. Iron deficiency anemia, unspecified iron deficiency anemia type - CMP14+EGFR  5. Constipation, unspecified constipation type - CMP14+EGFR  6. Other osteoporosis, unspecified pathological fracture presence - CMP14+EGFR  7. Tobacco abuse - CMP14+EGFR  8. Chronic bilateral low back pain without sciatica - diclofenac Sodium (VOLTAREN) 1 % GEL; Apply 2 g topically 4 (four) times daily.  Dispense: 350 g; Refill: 6 - CMP14+EGFR  9. Depression, major, single episode, mild (HCC) - CMP14+EGFR  10. Eustachian tube dysfunction, bilateral Smoking cessation discussed  Start allegra, flonase, and nasal decongestant  Keep ENT follow up - fexofenadine (ALLEGRA) 180 MG tablet; Take 1 tablet (180 mg total) by mouth daily. OTC  Dispense: 90 tablet; Refill: 1 - CMP14+EGFR    Labs pending Will stop Ibuprofen and restart Celebrex 200 mg BID, no other NSAIDs Diclofenac gel Smoking cessation discussed  Start allegra, flonase, and nasal decongestant  Keep ENT follow up Health Maintenance reviewed Diet and exercise encouraged  Follow up plan: 6 months   Jannifer Rodney, FNP

## 2023-10-03 LAB — CMP14+EGFR
ALT: 14 IU/L (ref 0–32)
AST: 19 IU/L (ref 0–40)
Albumin: 4.2 g/dL (ref 3.8–4.9)
Alkaline Phosphatase: 73 IU/L (ref 44–121)
BUN/Creatinine Ratio: 11 (ref 9–23)
BUN: 8 mg/dL (ref 6–24)
Bilirubin Total: <0.2 mg/dL (ref 0.0–1.2)
CO2: 22 mmol/L (ref 20–29)
Calcium: 9.5 mg/dL (ref 8.7–10.2)
Chloride: 98 mmol/L (ref 96–106)
Creatinine, Ser: 0.7 mg/dL (ref 0.57–1.00)
Globulin, Total: 2.4 g/dL (ref 1.5–4.5)
Glucose: 84 mg/dL (ref 70–99)
Potassium: 4.5 mmol/L (ref 3.5–5.2)
Sodium: 137 mmol/L (ref 134–144)
Total Protein: 6.6 g/dL (ref 6.0–8.5)
eGFR: 100 mL/min/{1.73_m2} (ref >59)

## 2023-10-14 ENCOUNTER — Encounter: Payer: Self-pay | Admitting: Acute Care

## 2023-10-14 ENCOUNTER — Other Ambulatory Visit: Payer: Self-pay | Admitting: Family

## 2023-10-19 ENCOUNTER — Encounter: Admitting: Acute Care

## 2023-10-30 ENCOUNTER — Ambulatory Visit: Admitting: Acute Care

## 2023-10-30 ENCOUNTER — Encounter: Payer: Self-pay | Admitting: Acute Care

## 2023-10-30 DIAGNOSIS — F17210 Nicotine dependence, cigarettes, uncomplicated: Secondary | ICD-10-CM | POA: Diagnosis not present

## 2023-10-30 NOTE — Progress Notes (Signed)
  Virtual Visit via Telephone Note  I connected with [name] on 10/30/23 at 10:00 AM EDT by telephone and verified that I am speaking with the correct person using two identifiers.  Location: Patient:  At home Provider: [address], [place], [place], [address]    I discussed the limitations, risks, security and privacy concerns of performing an evaluation and management service by telephone and the availability of in person appointments. I also discussed with the patient that there may be a patient responsible charge related to this service. The patient expressed understanding and agreed to proceed.     Shared Decision Making Visit Lung Cancer Screening Program [phone])   Eligibility: Age 58 y.o. Pack Years Smoking History Calculation 57 pack years (# packs/per year x # years smoked) Recent History of coughing up blood  no Unexplained weight loss? no ( >Than 15 pounds within the last 6 months ) Prior History Lung / other cancer no (Diagnosis within the last 5 years already requiring surveillance chest CT Scans). Smoking Status Current Smoker Former Smokers: Years since quit: NA   Quit Date: NA  Visit Components: Discussion included one or more decision making aids. yes Discussion included risk/benefits of screening. yes Discussion included potential follow up diagnostic testing for abnormal scans. yes Discussion included meaning and risk of over diagnosis. yes Discussion included meaning and risk of False Positives. yes Discussion included meaning of total radiation exposure. yes  Counseling Included: Importance of adherence to annual lung cancer LDCT screening. yes Impact of comorbidities on ability to participate in the program. yes Ability and willingness to under diagnostic treatment. yes  Smoking Cessation Counseling: Current Smokers:  Discussed importance of smoking cessation. yes Information about tobacco cessation classes and interventions provided  to patient. yes Patient provided with "ticket" for LDCT Scan. yes Symptomatic Patient. no  Counseling NA Diagnosis Code: Tobacco Use Z72.0 Asymptomatic Patient yes  Counseling (Intermediate counseling: > three minutes counseling) W0981 Former Smokers:  Discussed the importance of maintaining cigarette abstinence. yes Diagnosis Code: Personal History of Nicotine  Dependence. X91.478 Information about tobacco cessation classes and interventions provided to patient. Yes Patient provided with "ticket" for LDCT Scan. yes Written Order for Lung Cancer Screening with LDCT placed in Epic. Yes (CT Chest Lung Cancer Screening Low Dose W/O CM) [id number] Z12.2-Screening of respiratory organs Z87.891-Personal history of nicotine  dependence  Current smoker, counseled to quit x 3-4 minutes   Raejean Bullock, NP 10/30/2023

## 2023-10-30 NOTE — Patient Instructions (Signed)

## 2023-11-17 ENCOUNTER — Encounter (INDEPENDENT_AMBULATORY_CARE_PROVIDER_SITE_OTHER): Payer: Self-pay | Admitting: Physician Assistant

## 2023-11-17 ENCOUNTER — Ambulatory Visit (INDEPENDENT_AMBULATORY_CARE_PROVIDER_SITE_OTHER): Admitting: Physician Assistant

## 2023-11-17 ENCOUNTER — Ambulatory Visit (INDEPENDENT_AMBULATORY_CARE_PROVIDER_SITE_OTHER): Admitting: Audiology

## 2023-11-17 VITALS — BP 135/87 | HR 87 | Ht 65.0 in | Wt 127.0 lb

## 2023-11-17 DIAGNOSIS — H906 Mixed conductive and sensorineural hearing loss, bilateral: Secondary | ICD-10-CM

## 2023-11-17 DIAGNOSIS — H900 Conductive hearing loss, bilateral: Secondary | ICD-10-CM

## 2023-11-17 NOTE — Progress Notes (Signed)
  [address], [address] [place], [place] [address] [phone]  Audiological Evaluation    Name: [name]     DOB:   28-Dec-1965      MRN:   [id number]                                                                                     Service Date: 11/17/2023     Accompanied by: sister   Patient comes today after Dr. Lydia Sams, ENT sent a referral for a hearing evaluation due to concerns with hearing loss.   Symptoms Yes Details  Hearing loss  [x]  Left side is better. Hearing difficulty in both ears - has been going on for a while ( years)  Tinnitus  [x]  Sometimes in the right ear   Ear pain/ infections/pressure  []    Balance problems  [x]  Off balance sometimes when she wakes up in the night to go to the restroom  Noise exposure history  []    Previous ear surgeries  []    Family history of hearing loss  [x]  Father and gradparents, with age  Amplification  []    Other  []      Otoscopy: Right ear: Clear external ear canals and notable landmarks visualized on the tympanic membrane. Left ear:  Clear external ear canals and notable landmarks visualized on the tympanic membrane.  Tympanometry: Right ear: Type B- Normal external ear canal volume with no middle ear pressure peak or tympanic membrane compliance. Left ear: Type Ad- Normal external ear canal volume with normal middle ear pressure and high tympanic membrane compliance.    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 Left ear-  Mild to severe mixed hearing loss from 125 Hz - 8000 Hz.  Speech Audiometry: Right ear- Speech Reception Threshold (SRT) was obtained at 60 dBHL. Left ear-Speech Reception Threshold (SRT) was obtained at 50 dBHL.   Word Recognition Score Tested using NU-6 (MLV) Right ear: 96% was obtained at a presentation level of 95 dBHL with contralateral masking which is deemed as  excellent. Left ear: 96% was obtained at a presentation level of 95 dBHL  with contralateral masking which is deemed as  excellent.   The hearing test results were completed under headphones and re-checked with inserts and results are deemed to be of good to fair reliability. Test technique:  conventional   Weber- lateralized to the right Rinne - BC>AC AU   Recommendations: Follow up with ENT as scheduled for today. Repeat audiogram after medical care.   [name], AUD

## 2023-11-17 NOTE — Patient Instructions (Signed)
 I have ordered an imaging study for you to complete prior to your next visit. Please call [facility] Scheduling at [phone] to schedule your imaging if you have not received a call within 24 hours. If you are unable to complete your imaging study prior to your next scheduled visit please call our office to let us know.

## 2023-11-17 NOTE — Progress Notes (Unsigned)
 Dear Dr. Rosalynn Come, Here is my assessment for our mutual patient, [name]. Thank you for allowing me the opportunity to care for your patient. Please do not hesitate to contact me should you have any other questions. Sincerely, Belma Boxer PA-C  Otolaryngology Clinic Note Referring provider: Dr. Rosalynn Come HPI:  [name] is a 58 y.o. female kindly referred by Dr. Rosalynn Come   The patient is a 58 year old female seen in our office for evaluation of hearing loss.  The patient notes that approximately 1 year ago she started to have posterior headaches, worse with movement.  She notes they wax and wane, they are often severe at times.  She notes she would have resolution of symptoms and they would again return.  She notes that the last episode of pain was approximately 1 to 2 months ago.  She denies any associated trauma or fever.  She notes a longstanding history of hearing loss for several years but this has gotten worse.  She notes the right sided hearing loss is worse when compared to the left.  She does note that when she blows her nose or tries to pop her ears her hearing does get better.  She denies any associated trauma to the head or neck, she denies any history of head or neck surgery.  She did not have recurrent ear infections as a child or as an adult.  She denies any drainage from the ears.  He denies any pain today.  She does report a history of smoking, she denies any difficulty swallowing or changes to her voice.  Reported a history of seasonal allergies.   Independent Review of Additional Tests or Records:  Audiological evaluation on 11/17/2023-     Bilateral mixed hearing loss with significant reduction in air conduction bilateral  CT head without contrast on 09/17/2023-radiology interpretation shows no acute intracranial abnormality, complete effusion of the right mastoid air cells with fluid noted within the right middle ear.  Partial fusion of the left mastoid air cells with fluid  noted in the left middle ear.  Upon my interpretation of the CT I do see an area of what appears to be loss of bone with opacification along the superior posterior distal IAC  PMH/Meds/All/SocHx/FamHx/ROS:   Past Medical History:  Diagnosis Date   Anemia    Anxiety    Hyperlipidemia    Hypertension    Osteoporosis      Past Surgical History:  Procedure Laterality Date   ABDOMINAL HYSTERECTOMY     Partial   HARDWARE REMOVAL Left 06/20/2015   Procedure: HARDWARE REMOVAL;  Surgeon: Darrin Emerald, MD;  Location: [facility];  Service: Orthopedics;  Laterality: Left;  left knee   ORIF PATELLA Left 12/29/2014   Procedure: OPEN REDUCTION INTERNAL FIXATION LEFT PATELLA;  Surgeon: Darrin Emerald, MD;  Location: [facility];  Service: Orthopedics;  Laterality: Left;   TOTAL HIP ARTHROPLASTY Left 06/23/2022   Procedure: TOTAL HIP ARTHROPLASTY ANTERIOR APPROACH;  Surgeon: Adonica Hoose, MD;  Location: [facility];  Service: Orthopedics;  Laterality: Left;    Family History  Problem Relation Age of Onset   COPD Mother    Osteoporosis Mother    COPD Father    Osteoporosis Father    Colon cancer Neg Hx    Breast cancer Neg Hx      Social Connections: Unknown (11/04/2021)   Received from [facility]   Social Network    Social Network: Not on file  Current Outpatient Medications:    alendronate  (FOSAMAX ) 70 MG tablet, TAKE 1 TABLET BY MOUTH ONCE A WEEK. TAKE WITH A FULL GLASS OF WATER  ON AN EMPTY STOMACH., Disp: 12 tablet, Rfl: 0   calcium -vitamin D  (OSCAL WITH D) 500-200 MG-UNIT tablet, Take 1 tablet by mouth daily., Disp: , Rfl:    celecoxib  (CELEBREX ) 200 MG capsule, Take 1 capsule (200 mg total) by mouth 2 (two) times daily., Disp: 180 capsule, Rfl: 0   cyclobenzaprine  (FLEXERIL ) 5 MG tablet, TAKE 1 TABLET BY MOUTH 2 TIMES DAILY AS NEEDED FOR MUSCLE SPASMS., Disp: 60 tablet, Rfl: 2   diclofenac  Sodium (VOLTAREN ) 1 % GEL, Apply 2 g topically 4 (four) times daily., Disp:  350 g, Rfl: 6   diphenhydrAMINE  (BENADRYL ) 25 mg capsule, Take 25 mg by mouth daily., Disp: , Rfl:    docusate sodium  (COLACE) 100 MG capsule, Take 100 mg by mouth daily., Disp: , Rfl:    DULoxetine  (CYMBALTA ) 60 MG capsule, Take 1 capsule (60 mg total) by mouth daily., Disp: 90 capsule, Rfl: 3   fexofenadine  (ALLEGRA ) 180 MG tablet, Take 1 tablet (180 mg total) by mouth daily. OTC, Disp: 90 tablet, Rfl: 1   fluticasone  (FLONASE ) 50 MCG/ACT nasal spray, SPRAY 2 SPRAYS INTO EACH NOSTRIL EVERY DAY, Disp: 48 g, Rfl: 1   folic acid  (FOLVITE ) 1 MG tablet, Take 1 tablet (1 mg total) by mouth daily., Disp: 90 tablet, Rfl: 0   hydrOXYzine  (VISTARIL ) 25 MG capsule, TAKE 1 CAPSULE (25 MG TOTAL) BY MOUTH EVERY 6 (SIX) HOURS AS NEEDED., Disp: 90 capsule, Rfl: 3   nicotine  (NICODERM CQ  - DOSED IN MG/24 HOURS) 14 mg/24hr patch, APPLY 1 PATCH ONTO THE SKIN EVERY DAY, Disp: 28 patch, Rfl: 2   nicotine  polacrilex (NICORETTE ) 4 MG gum, CHEW 1 EACH (4 MG TOTAL) AS DIRECTED BY MOUTH AS NEEDED FOR SMOKING CESSATION., Disp: 100 each, Rfl: 2   polyethylene glycol powder (GLYCOLAX /MIRALAX ) 17 GM/SCOOP powder, Take 17 g by mouth 2 (two) times daily as needed., Disp: 3350 g, Rfl: 1   Simethicone  (GAS-X EXTRA STRENGTH) 125 MG CAPS, Take 1 capsule (125 mg total) by mouth daily., Disp: 28 each, Rfl: 0   simvastatin  (ZOCOR ) 20 MG tablet, Take 1 tablet (20 mg total) by mouth daily at 6 PM., Disp: 90 tablet, Rfl: 0   Physical Exam:   BP 135/87   Pulse 87   Ht 5\' 5"  (1.651 m)   Wt 127 lb (57.6 kg)   SpO2 98%   BMI 21.13 kg/m   Pertinent Findings  CN II-XII intact Right external auditory neck canal clear, she does have an area of overlying tissue changes with a thin layer of thickened yellowish scabbing along the posterior superior distal EAC, this is soft and feels fluctuant, TM is intact with serous effusion.  Left EAC clear, TM intact with notable retraction Weber 512: Localizes right Rinne 512: Bone-conduction greater  than air conduction bilateral Anterior rhinoscopy: Septum midline; bilateral inferior turbinates with minimal hypertrophy No lesions of oral cavity/oropharynx; dentition in normal limits No obviously palpable neck masses/lymphadenopathy/thyromegaly No respiratory distress or stridor   Seprately Identifiable Procedures:  None  Impression & Plans:  [name] is a 58 y.o. female with the following   Hearing loss-  The patient presents today with bilateral mixed hearing loss with a significant reduction in her conductive portion bilaterally.  Her audiological evaluation is significant for right sided type B tympanometry.  She did have a CT of her head  in March of this year that was significant for complete effusion of the right mastoid air cells with fluid noted in the right middle ear and partial effusion of the left mastoid air cells with fluid noted in the left middle ear.  When I review the CT I do see an area of concern along the right distal EAC at the area where I can visualize what appears to be fluctuance.  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  The patient has no drainage.  In addition to the hearing loss she has had headaches as well over the last several months.  I have ordered a CT temporal bone, I have also ordered an MRI IAC for further evaluation of the soft tissue structures.  I discussed this with the patient, and her sister.  I also spoke with her daughter on the phone, once these images are available I like to see her back in the office 2 weeks after they are completed.  At that time I will perform nasal endoscopy as well.  I like to see her sooner if she develops any new or worsening signs or symptoms.  They patient and family verbalized understanding and agreement to today's plan and had no further questions or concerns.    - f/u weeks after imaging is complete   Thank you for allowing me the opportunity to care for your patient. Please do not hesitate to contact me should  you have any other questions.  Sincerely, Belma Boxer PA-C [facility] Phone: [phone] Fax: [phone]  11/17/2023, 1:04 PM

## 2023-11-20 ENCOUNTER — Other Ambulatory Visit: Payer: Self-pay | Admitting: Family

## 2023-11-20 DIAGNOSIS — F418 Other specified anxiety disorders: Secondary | ICD-10-CM

## 2023-11-20 DIAGNOSIS — F411 Generalized anxiety disorder: Secondary | ICD-10-CM

## 2023-11-23 ENCOUNTER — Institutional Professional Consult (permissible substitution) (INDEPENDENT_AMBULATORY_CARE_PROVIDER_SITE_OTHER): Admitting: Otolaryngology

## 2023-12-01 ENCOUNTER — Other Ambulatory Visit: Payer: Self-pay | Admitting: Family

## 2023-12-01 DIAGNOSIS — F411 Generalized anxiety disorder: Secondary | ICD-10-CM

## 2023-12-02 ENCOUNTER — Ambulatory Visit (HOSPITAL_COMMUNITY)
Admission: RE | Admit: 2023-12-02 | Discharge: 2023-12-02 | Disposition: A | Discharge status: Home / Self Care | Source: Ambulatory Visit | Attending: Physician Assistant | Admitting: Physician Assistant

## 2023-12-02 DIAGNOSIS — H900 Conductive hearing loss, bilateral: Secondary | ICD-10-CM | POA: Insufficient documentation

## 2023-12-02 MED ORDER — GADOBUTROL 1 MMOL/ML IV SOLN
6.0000 mL | Freq: Once | INTRAVENOUS | Status: AC | PRN
Start: 2023-12-02 — End: 2023-12-02
  Administered 2023-12-02: 6 mL via INTRAVENOUS

## 2023-12-08 ENCOUNTER — Encounter (INDEPENDENT_AMBULATORY_CARE_PROVIDER_SITE_OTHER): Payer: Self-pay | Admitting: Physician Assistant

## 2023-12-08 ENCOUNTER — Ambulatory Visit (INDEPENDENT_AMBULATORY_CARE_PROVIDER_SITE_OTHER): Admitting: Physician Assistant

## 2023-12-08 VITALS — BP 120/79 | HR 84

## 2023-12-08 DIAGNOSIS — H9190 Unspecified hearing loss, unspecified ear: Secondary | ICD-10-CM | POA: Diagnosis not present

## 2023-12-08 DIAGNOSIS — H906 Mixed conductive and sensorineural hearing loss, bilateral: Secondary | ICD-10-CM

## 2023-12-08 DIAGNOSIS — H6690 Otitis media, unspecified, unspecified ear: Secondary | ICD-10-CM | POA: Diagnosis not present

## 2023-12-08 NOTE — Progress Notes (Signed)
 Dear Dr. Marylin So, Here is my assessment for our mutual patient, [name]. Thank you for allowing me the opportunity to care for your patient. Please do not hesitate to contact me should you have any other questions. Sincerely, Belma Boxer PA-C  Otolaryngology Clinic Note Referring provider: Dr. Marylin So HPI:  [name] is a 58 y.o. female kindly referred by Dr. Marylin So   The patient is a 43 YOF seen in our office for follow up evaluation of hearing loss. She was last seen in the office on 11/17/2023. At that time she was seen for hearing loss. Below is a recap of that encounter  The patient is a 58 year old female seen in our office for evaluation of hearing loss.  The patient notes that approximately 1 year ago she started to have posterior headaches, worse with movement.  She notes they wax and wane, they are often severe at times.  She notes she would have resolution of symptoms and they would again return.  She notes that the last episode of pain was approximately 1 to 2 months ago.  She denies any associated trauma or fever.  She notes a longstanding history of hearing loss for several years but this has gotten worse.  She notes the right sided hearing loss is worse when compared to the left.  She does note that when she blows her nose or tries to pop her ears her hearing does get better.  She denies any associated trauma to the head or neck, she denies any history of head or neck surgery.  She did not have recurrent ear infections as a child or as an adult.  She denies any drainage from the ears.  He denies any pain today.  She does report a history of smoking, she denies any difficulty swallowing or changes to her voice.  Reported a history of seasonal allergies   Update 12/08/2023-  At her last visit she was noted to have headaches and hearing loss. 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 She also had complete effusion of the right mastoid air cells with fluid noted in the middle ear; partial fusion of the left mastoid air cells with fluid noted in the left middle ear.   I recommended CT temporal bone as well as MR IAC. She did complete the MRI but results are not available, a call was placed to radiology for review. She has not completed the CT scan. She does have a 57 pack year history of smoking. She denies any neurologic deficits.    Independent Review of Additional Tests or Records:  None    PMH/Meds/All/SocHx/FamHx/ROS:   Past Medical History:  Diagnosis Date   Anemia    Anxiety    Hyperlipidemia    Hypertension    Osteoporosis      Past Surgical History:  Procedure Laterality Date   ABDOMINAL HYSTERECTOMY     Partial   HARDWARE REMOVAL Left 06/20/2015   Procedure: HARDWARE REMOVAL;  Surgeon: Darrin Emerald, MD;  Location: [facility];  Service: Orthopedics;  Laterality: Left;  left knee   ORIF PATELLA Left 12/29/2014   Procedure: OPEN REDUCTION INTERNAL FIXATION LEFT PATELLA;  Surgeon: Darrin Emerald, MD;  Location: [facility];  Service: Orthopedics;  Laterality: Left;   TOTAL HIP ARTHROPLASTY Left 06/23/2022   Procedure: TOTAL HIP ARTHROPLASTY ANTERIOR APPROACH;  Surgeon: Adonica Hoose, MD;  Location: [facility];  Service: Orthopedics;  Laterality: Left;    Family History  Problem Relation Age of Onset   COPD Mother    Osteoporosis Mother    COPD Father    Osteoporosis Father    Colon cancer Neg Hx    Breast cancer Neg Hx      Social Connections: Unknown (11/04/2021)   Received from [facility]   Social Network    Social Network: Not on file      Current Outpatient Medications:    alendronate  (FOSAMAX ) 70 MG tablet, TAKE 1 TABLET BY MOUTH ONCE A WEEK. TAKE WITH A FULL GLASS OF WATER  ON AN EMPTY STOMACH., Disp: 12 tablet, Rfl: 0   calcium -vitamin D  (OSCAL WITH D) 500-200 MG-UNIT tablet, Take 1 tablet by mouth daily., Disp: ,  Rfl:    celecoxib  (CELEBREX ) 200 MG capsule, Take 1 capsule (200 mg total) by mouth 2 (two) times daily., Disp: 180 capsule, Rfl: 0   cyclobenzaprine  (FLEXERIL ) 5 MG tablet, TAKE 1 TABLET BY MOUTH 2 TIMES DAILY AS NEEDED FOR MUSCLE SPASMS., Disp: 60 tablet, Rfl: 2   diclofenac  Sodium (VOLTAREN ) 1 % GEL, Apply 2 g topically 4 (four) times daily., Disp: 350 g, Rfl: 6   diphenhydrAMINE  (BENADRYL ) 25 mg capsule, Take 25 mg by mouth daily., Disp: , Rfl:    docusate sodium  (COLACE) 100 MG capsule, Take 100 mg by mouth daily., Disp: , Rfl:    DULoxetine  (CYMBALTA ) 60 MG capsule, TAKE 1 CAPSULE BY MOUTH EVERY DAY, Disp: 90 capsule, Rfl: 1   fexofenadine  (ALLEGRA ) 180 MG tablet, Take 1 tablet (180 mg total) by mouth daily. OTC, Disp: 90 tablet, Rfl: 1   fluticasone  (FLONASE ) 50 MCG/ACT nasal spray, SPRAY 2 SPRAYS INTO EACH NOSTRIL EVERY DAY, Disp: 48 g, Rfl: 1   folic acid  (FOLVITE ) 1 MG tablet, Take 1 tablet (1 mg total) by mouth daily., Disp: 90 tablet, Rfl: 0   hydrOXYzine  (VISTARIL ) 25 MG capsule, TAKE 1 CAPSULE (25 MG TOTAL) BY MOUTH EVERY 6 (SIX) HOURS AS NEEDED., Disp: 90 capsule, Rfl: 3   nicotine  (NICODERM CQ  - DOSED IN MG/24 HOURS) 14 mg/24hr patch, APPLY 1 PATCH ONTO THE SKIN EVERY DAY, Disp: 28 patch, Rfl: 2   nicotine  polacrilex (NICORETTE ) 4 MG gum, CHEW 1 EACH (4 MG TOTAL) AS DIRECTED BY MOUTH AS NEEDED FOR SMOKING CESSATION., Disp: 100 each, Rfl: 2   polyethylene glycol powder (GLYCOLAX /MIRALAX ) 17 GM/SCOOP powder, Take 17 g by mouth 2 (two) times daily as needed., Disp: 3350 g, Rfl: 1   Simethicone  (GAS-X EXTRA STRENGTH) 125 MG CAPS, Take 1 capsule (125 mg total) by mouth daily., Disp: 28 each, Rfl: 0   simvastatin  (ZOCOR ) 20 MG tablet, Take 1 tablet (20 mg total) by mouth daily at 6 PM., Disp: 90 tablet, Rfl: 0   Physical Exam:   BP 120/79   Pulse 84   SpO2 96%   Pertinent Findings   CN II-XII intact Right external auditory neck canal clear, she does have an area of overlying tissue  changes with a thin layer of thickened yellowish scabbing along the posterior superior distal EAC, this is soft and feels fluctuant, TM is intact mucoid effusion.  Left EAC clear, TM intact pneumatized middle ear space Anterior rhinoscopy: Septum left deviation; bilateral inferior turbinates with minimal hypertrophy No lesions of oral cavity/oropharynx; dentition in normal limits No obviously palpable neck masses/lymphadenopathy/thyromegaly No respiratory distress or stridor   Left   Right  Seprately Identifiable Procedures:  Procedure  Note Pre-procedure diagnosis:  middle ear effusion Post-procedure diagnosis: Same Procedure: Transnasal Fiberoptic Laryngoscopy, CPT 31575 - Mod 25 Indication: see above Complications: None apparent EBL: 0 mL   The procedure was undertaken to further evaluate the patient's complaint of middle ear effusion, with mirror exam inadequate for appropriate examination due to gag reflex and poor patient tolerance   Procedure:  Patient was identified as correct patient. Verbal consent was obtained. The nose was sprayed with oxymetazoline and 4% lidocaine . The The flexible laryngoscope was passed through the nose to view the nasal cavity, pharynx (oropharynx, hypopharynx) and larynx.  The larynx was examined at rest and during multiple phonatory tasks. Documentation was obtained and reviewed with patient. The scope was removed. The patient tolerated the procedure well.   Findings: The nasal cavity and nasopharynx did not reveal any masses or lesions, mucosa appeared to be without obvious lesions. The tongue base, pharyngeal walls, piriform sinuses, vallecula, epiglottis and postcricoid region are normal in appearance. The visualized portion of the subglottis and proximal trachea is widely patent. The vocal folds are mobile bilaterally.. There are no lesions on the free edge of the vocal folds nor elsewhere in the larynx worrisome for malignancy.  Bilateral eustachian  tube meatus open, right slightly more narrowed when compared to the left with no obvious lesions  Impression & Plans:  [name] is a 58 y.o. female with the following   Hearing loss-  58 year old female seen in our office for follow-up evaluation of hearing loss and middle ear effusions.  Since her last office visit she has been doing better, she notes her hearing has improved, no pain.  Continued concern for abnormality within the right external auditory canal and abnormal CT findings.  The MRI was completed but the results were not available for review today.  I did have our office reach out to radiology to attempt a review. Once her images are complete I will call her with results and plan.    - f/u Phone call after CT and MRI results available    Thank you for allowing me the opportunity to care for your patient. Please do not hesitate to contact me should you have any other questions.  Sincerely, Belma Boxer PA-C [facility] Phone: [phone] Fax: [phone]  12/08/2023, 11:31 AM

## 2023-12-13 ENCOUNTER — Other Ambulatory Visit: Payer: Self-pay | Admitting: Family

## 2023-12-13 DIAGNOSIS — M818 Other osteoporosis without current pathological fracture: Secondary | ICD-10-CM

## 2023-12-14 ENCOUNTER — Ambulatory Visit (HOSPITAL_COMMUNITY)
Admission: RE | Admit: 2023-12-14 | Discharge: 2023-12-14 | Disposition: A | Discharge status: Home / Self Care | Source: Ambulatory Visit | Attending: Physician Assistant | Admitting: Physician Assistant

## 2023-12-14 DIAGNOSIS — H902 Conductive hearing loss, unspecified: Secondary | ICD-10-CM | POA: Diagnosis not present

## 2023-12-14 DIAGNOSIS — H900 Conductive hearing loss, bilateral: Secondary | ICD-10-CM | POA: Diagnosis present

## 2023-12-14 DIAGNOSIS — H9193 Unspecified hearing loss, bilateral: Secondary | ICD-10-CM | POA: Diagnosis not present

## 2023-12-21 ENCOUNTER — Other Ambulatory Visit: Payer: Self-pay | Admitting: Family

## 2023-12-21 DIAGNOSIS — G8929 Other chronic pain: Secondary | ICD-10-CM

## 2023-12-22 ENCOUNTER — Other Ambulatory Visit: Payer: Self-pay | Admitting: Family

## 2024-01-06 ENCOUNTER — Telehealth: Payer: Self-pay

## 2024-01-06 NOTE — Telephone Encounter (Addendum)
 The scans were performed in June. They have not been reviewed at this time. Will route message to [facility]. Pt made aware.

## 2024-01-06 NOTE — Telephone Encounter (Signed)
 Copied from CRM [id number]. Topic: Clinical - Lab/Test Results >> Jan 06, 2024 12:26 PM Santiya F wrote: Reason for CRM: Patient is calling in for her MRI and CT scan results. Please follow up with patient. Patient says her daughter may answer the phone and if so it is okay to tell her because she will help her understand.

## 2024-01-07 NOTE — Telephone Encounter (Signed)
 Patient aware they called this morning and made her an appointment.

## 2024-01-07 NOTE — Telephone Encounter (Signed)
 ENT ordered these, please have her contact her ENT for follow up.

## 2024-01-12 ENCOUNTER — Institutional Professional Consult (permissible substitution) (INDEPENDENT_AMBULATORY_CARE_PROVIDER_SITE_OTHER): Admitting: Otolaryngology

## 2024-02-07 ENCOUNTER — Other Ambulatory Visit: Payer: Self-pay | Admitting: Family

## 2024-02-07 DIAGNOSIS — E782 Mixed hyperlipidemia: Secondary | ICD-10-CM

## 2024-02-20 ENCOUNTER — Other Ambulatory Visit: Payer: Self-pay | Admitting: Family

## 2024-02-20 DIAGNOSIS — G8929 Other chronic pain: Secondary | ICD-10-CM

## 2024-02-24 ENCOUNTER — Encounter: Payer: Self-pay | Admitting: *Deleted

## 2024-03-01 ENCOUNTER — Other Ambulatory Visit: Payer: Self-pay | Admitting: Family

## 2024-03-01 DIAGNOSIS — F411 Generalized anxiety disorder: Secondary | ICD-10-CM

## 2024-03-01 NOTE — Telephone Encounter (Unsigned)
 Copied from CRM [id number]. Topic: Clinical - Medication Refill >> Mar 01, 2024  2:19 PM Wess RAMAN wrote: Medication: hydrOXYzine  (VISTARIL ) 25 MG capsule   Has the patient contacted their pharmacy? Yes (Agent: If no, request that the patient contact the pharmacy for the refill. If patient does not wish to contact the pharmacy document the reason why and proceed with request.) (Agent: If yes, when and what did the pharmacy advise?)  This is the patient's preferred pharmacy:  [id number] - [place], [place] - [address] [place] [address] Phone: [phone] Fax: [phone]  Is this the correct pharmacy for this prescription? Yes If no, delete pharmacy and type the correct one.   Has the prescription been filled recently? Yes  Is the patient out of the medication? Yes  Has the patient been seen for an appointment in the last year OR does the patient have an upcoming appointment? Yes  Can we respond through MyChart? No  Agent: Please be advised that Rx refills may take up to 3 business days. We ask that you follow-up with your pharmacy.

## 2024-03-02 MED ORDER — HYDROXYZINE PAMOATE 25 MG PO CAPS: 25.0000 mg | ORAL_CAPSULE | Freq: Four times a day (QID) | ORAL | 0 refills | Status: DC | PRN

## 2024-03-17 ENCOUNTER — Encounter (INDEPENDENT_AMBULATORY_CARE_PROVIDER_SITE_OTHER): Payer: Self-pay | Admitting: Otolaryngology

## 2024-03-17 ENCOUNTER — Ambulatory Visit (INDEPENDENT_AMBULATORY_CARE_PROVIDER_SITE_OTHER): Admitting: Otolaryngology

## 2024-03-17 VITALS — BP 134/83 | HR 96 | Ht 65.0 in | Wt 128.0 lb

## 2024-03-17 DIAGNOSIS — F17210 Nicotine dependence, cigarettes, uncomplicated: Secondary | ICD-10-CM

## 2024-03-17 DIAGNOSIS — F17200 Nicotine dependence, unspecified, uncomplicated: Secondary | ICD-10-CM

## 2024-03-17 DIAGNOSIS — H7193 Unspecified cholesteatoma, bilateral: Secondary | ICD-10-CM

## 2024-03-17 DIAGNOSIS — H906 Mixed conductive and sensorineural hearing loss, bilateral: Secondary | ICD-10-CM

## 2024-03-17 NOTE — Progress Notes (Signed)
 Dear Dr. Lavell, Here is my assessment for our mutual patient, [name]. Thank you for allowing me the opportunity to care for your patient. Please do not hesitate to contact me should you have any other questions. Sincerely, Dr. Eldora Blanch  Otolaryngology Clinic Note Referring provider: Dr. Lavell HPI:  [name] is a 58 y.o. female kindly referred by Dr. Lavell for evaluation of possible right ear cholesteatoma  Initially saw [name] for noted worsening hearing and posterior headaches worse with movement. Worsening hearing loss, on right. Tanya noted an area of fluctuance along the EAC and obtained imaging which was concerning for cholesteatoma and patient was referred to me for further evaluation.   Initial visit (02/2024): She reports a longstanding hearing loss right > left with right postauricular pressure and discomfort and fullness. She also reports that she has had discolored drainage from the ear (brown or sometimes) green which happens intermittently. She denies any vertigo. She denies any symptoms on the left. Does report long-standing history of issues with ears with multiple ear infections as a child. She does not have any headaches or sound or pressure induced vertigo. Does have water  sensitivity with discomfort.  Patient also denies barotrauma, vestibular suppressant use, ototoxic medication use Prior ear surgery: denies  Daughter is with her today  ENT Surgery: Denies Personal or FHx of bleeding dz or anesthesia difficulty: no  GLP-1: no AP/AC: no  Tobacco: yes  PMHx: Headache, MDD, HTN, GAD  Independent Review of Additional Tests or Records:  CT Temporal bones 12/14/2023 and MRI IAC 12/02/2023 independently interpreted with respect to ears:  AD - soft tissue opacification of left mastoid and epitympanum with scutum blunted; clear ossicular chain erosion with concern for small tegmen dehiscence; no noted lateral canal fistula; on the left, mastoid is wel  aerated but there is TM retraction and opacification of prussak's space abutting head of malleus without noted erosive change; facial nerve appears covered in bone MRI IAC with right mastoid effusion and T1 low, diffusion restriction lesion right temporal bone and middle ear; no noted large encephalocele  [name] notes reviewed Audio (2025): Noted b/l symmetric CHL with B/As tymps, not large volume   PMH/Meds/All/SocHx/FamHx/ROS:   Past Medical History:  Diagnosis Date   Anemia    Anxiety    Hyperlipidemia    Hypertension    Osteoporosis      Past Surgical History:  Procedure Laterality Date   ABDOMINAL HYSTERECTOMY     Partial   HARDWARE REMOVAL Left 06/20/2015   Procedure: HARDWARE REMOVAL;  Surgeon: Tanya FORBES Minerva, MD;  Location: [facility];  Service: Orthopedics;  Laterality: Left;  left knee   ORIF PATELLA Left 12/29/2014   Procedure: OPEN REDUCTION INTERNAL FIXATION LEFT PATELLA;  Surgeon: Tanya FORBES Minerva, MD;  Location: [facility];  Service: Orthopedics;  Laterality: Left;   TOTAL HIP ARTHROPLASTY Left 06/23/2022   Procedure: TOTAL HIP ARTHROPLASTY ANTERIOR APPROACH;  Surgeon: Fidel Rogue, MD;  Location: [facility];  Service: Orthopedics;  Laterality: Left;    Family History  Problem Relation Age of Onset   COPD Mother    Osteoporosis Mother    COPD Father    Osteoporosis Father    Colon cancer Neg Hx    Breast cancer Neg Hx      Social Connections: Unknown (11/04/2021)   Received from [facility]   Social Network    Social Network: Not on file      Current Outpatient Medications:    alendronate  (FOSAMAX )  70 MG tablet, TAKE 1 TABLET BY MOUTH ONCE A WEEK. TAKE WITH A FULL GLASS OF WATER  ON AN EMPTY STOMACH., Disp: 12 tablet, Rfl: 1   calcium -vitamin D  (OSCAL WITH D) 500-200 MG-UNIT tablet, Take 1 tablet by mouth daily., Disp: , Rfl:    celecoxib  (CELEBREX ) 200 MG capsule, Take 1 capsule (200 mg total) by mouth 2 (two) times daily., Disp: 180 capsule, Rfl: 0    cyclobenzaprine  (FLEXERIL ) 5 MG tablet, TAKE 1 TABLET BY MOUTH 2 TIMES DAILY AS NEEDED FOR MUSCLE SPASMS., Disp: 60 tablet, Rfl: 2   diclofenac  Sodium (VOLTAREN ) 1 % GEL, Apply 2 g topically 4 (four) times daily., Disp: 350 g, Rfl: 6   diphenhydrAMINE  (BENADRYL ) 25 mg capsule, Take 25 mg by mouth daily., Disp: , Rfl:    docusate sodium  (COLACE) 100 MG capsule, Take 100 mg by mouth daily., Disp: , Rfl:    DULoxetine  (CYMBALTA ) 60 MG capsule, TAKE 1 CAPSULE BY MOUTH EVERY DAY, Disp: 90 capsule, Rfl: 1   fexofenadine  (ALLEGRA ) 180 MG tablet, Take 1 tablet (180 mg total) by mouth daily. OTC, Disp: 90 tablet, Rfl: 1   fluticasone  (FLONASE ) 50 MCG/ACT nasal spray, SPRAY 2 SPRAYS INTO EACH NOSTRIL EVERY DAY, Disp: 48 g, Rfl: 1   folic acid  (FOLVITE ) 1 MG tablet, Take 1 tablet (1 mg total) by mouth daily., Disp: 90 tablet, Rfl: 0   hydrOXYzine  (VISTARIL ) 25 MG capsule, Take 1 capsule (25 mg total) by mouth every 6 (six) hours as needed., Disp: 90 capsule, Rfl: 0   nicotine  (NICODERM CQ  - DOSED IN MG/24 HOURS) 14 mg/24hr patch, APPLY 1 PATCH ONTO THE SKIN EVERY DAY, Disp: 90 patch, Rfl: 0   nicotine  polacrilex (NICORETTE ) 4 MG gum, CHEW 1 EACH (4 MG TOTAL) AS DIRECTED BY MOUTH AS NEEDED FOR SMOKING CESSATION., Disp: 100 each, Rfl: 2   polyethylene glycol powder (GLYCOLAX /MIRALAX ) 17 GM/SCOOP powder, Take 17 g by mouth 2 (two) times daily as needed., Disp: 3350 g, Rfl: 1   Simethicone  (GAS-X EXTRA STRENGTH) 125 MG CAPS, Take 1 capsule (125 mg total) by mouth daily., Disp: 28 each, Rfl: 0   simvastatin  (ZOCOR ) 20 MG tablet, TAKE 1 TABLET BY MOUTH DAILY AT 6 PM., Disp: 90 tablet, Rfl: 0   Physical Exam:   BP 134/83 (BP Location: Right Arm, Patient Position: Sitting, Cuff Size: Normal)   Pulse 96   Ht 5' 5 (1.651 m)   Wt 128 lb (58.1 kg)   SpO2 96%   BMI 21.30 kg/m   Salient findings:  CN II-XII intact Given history and complaints, ear microscopy was indicated and performed for evaluation with findings  as below in physical exam section and in procedures;  Bilateral EAC clear; on right clearly noted to have epithelial debris tracking from pars flaccida and posterosuperior quadrant of TM into the antrum and mastoid with anterior ME appearing well aerated but retracted; no evidence of pulsatile fluidl on left, pars flaccida and posterosuperior quadrant retraction with mild epithelial debris but otherwise no obvious noted perforation Weber 512: right Rinne BC>AC AU Anterior rhinoscopy: Septum intact; bilateral inferior turbinates without significant hypertrophy No lesions of oral cavity/oropharynx No respiratory distress or stridor  Seprately Identifiable Procedures:  Prior to initiating any procedures, risks/benefits/alternatives were explained to the patient and verbal consent obtained. Procedure: Bilateral ear microscopy using microscope (CPT [id number]) Pre-procedure diagnosis: concern for bilateral middle ear cholesteatoma Post-procedure diagnosis: same Indication: see above; given patient's otologic complaints and history, for improved and comprehensive examination of  external ear and tympanic membrane, bilateral otologic examination using microscope was performed. Prior to proceeding, verbal consent was obtained after discussion of R/B/A  Procedure: Patient was placed semi-recumbent. Both ear canals were examined using the microscope with findings above. Patient tolerated the procedure well.   Impression & Plans:  Tatia Petrucci is a 58 y.o. female with:  1. Cholesteatoma of both middle ears   2. Mixed conductive and sensorineural hearing loss of both ears   3. Tobacco use disorder    Likely bilateral but right > left cholesteatoma with long-standing history of drainage. We discussed that although tegmen thin and may be dehiscent, likely no CSF as no drainage today which is pulsatile  We discussed options for management and nature of cholesteatoma. We discussed given right worse than left  and extent of disease, recommend right CWD mastoidectomy with possible ossiculoplasty. Risks of tympanomastoidectomy discussed including: Bleeding, infection, pain, scar, change in hearing, vertigo/dizziness (small chance permanent), hearing decline which may occur or deafness, tinnitus, facial nerve paralysis, change in taste, cerebrospinal fluid leak, damage to the intracranial structures, external auditory canal stenosis, failure of graft, extrusion or poor position of prosthesis, recurrence of disease, need for additional procedures, risks of anesthesia.  We discussed that she would need regular cleanings and have water  precautions rest of her life.   She understood this and wished to proceed Will schedule for right CWD tympanomastoidectomy with ossiculoplasty, possible CSF leak repair, temporalis fascia graft and meatoplasty with facial nerve monitoring.  Given the patient's tobacco use, I also discussed cessation and options for cessation, including counseling. Counseled patient on the dangers of tobacco use, advised patient to stop smoking, and reviewed strategies to maximize success. Total time spent with this was 4 minutes.    - f/u POD 7  See below regarding exact medications prescribed this encounter including dosages and route: No orders of the defined types were placed in this encounter.     Thank you for allowing me the opportunity to care for your patient. Please do not hesitate to contact me should you have any other questions.  Sincerely, Eldora Blanch, MD Otolaryngologist (ENT), [facility] Phone: [phone] Fax: [phone]  03/20/2024, 5:45 PM   MDM:  Level 4 - 99214 Complexity/Problems addressed: mod  Data complexity: mod - independent interpretation of imaging; review of notes, test - Morbidity: mod - decision for surgery  - Prescription Drug prescribed or managed: no

## 2024-04-04 ENCOUNTER — Ambulatory Visit: Admitting: Family

## 2024-04-07 ENCOUNTER — Ambulatory Visit: Admitting: Family

## 2024-04-07 ENCOUNTER — Other Ambulatory Visit

## 2024-04-07 ENCOUNTER — Encounter: Payer: Self-pay | Admitting: Family

## 2024-04-07 VITALS — BP 121/85 | HR 99 | Temp 97.8°F | Ht 65.0 in | Wt 130.0 lb

## 2024-04-07 DIAGNOSIS — M818 Other osteoporosis without current pathological fracture: Secondary | ICD-10-CM

## 2024-04-07 DIAGNOSIS — G8929 Other chronic pain: Secondary | ICD-10-CM

## 2024-04-07 DIAGNOSIS — F320 Major depressive disorder, single episode, mild: Secondary | ICD-10-CM | POA: Diagnosis not present

## 2024-04-07 DIAGNOSIS — Z0001 Encounter for general adult medical examination with abnormal findings: Secondary | ICD-10-CM

## 2024-04-07 DIAGNOSIS — K5900 Constipation, unspecified: Secondary | ICD-10-CM | POA: Diagnosis not present

## 2024-04-07 DIAGNOSIS — Z122 Encounter for screening for malignant neoplasm of respiratory organs: Secondary | ICD-10-CM

## 2024-04-07 DIAGNOSIS — Z720 Tobacco use: Secondary | ICD-10-CM | POA: Diagnosis not present

## 2024-04-07 DIAGNOSIS — D509 Iron deficiency anemia, unspecified: Secondary | ICD-10-CM | POA: Diagnosis not present

## 2024-04-07 DIAGNOSIS — E782 Mixed hyperlipidemia: Secondary | ICD-10-CM

## 2024-04-07 DIAGNOSIS — F411 Generalized anxiety disorder: Secondary | ICD-10-CM | POA: Diagnosis not present

## 2024-04-07 DIAGNOSIS — M545 Low back pain, unspecified: Secondary | ICD-10-CM

## 2024-04-07 DIAGNOSIS — F418 Other specified anxiety disorders: Secondary | ICD-10-CM | POA: Diagnosis not present

## 2024-04-07 DIAGNOSIS — I10 Essential (primary) hypertension: Secondary | ICD-10-CM | POA: Diagnosis not present

## 2024-04-07 DIAGNOSIS — Z23 Encounter for immunization: Secondary | ICD-10-CM

## 2024-04-07 DIAGNOSIS — Z0000 Encounter for general adult medical examination without abnormal findings: Secondary | ICD-10-CM

## 2024-04-07 LAB — LIPID PANEL

## 2024-04-07 MED ORDER — SIMVASTATIN 20 MG PO TABS: 20.0000 mg | ORAL_TABLET | Freq: Every day | ORAL | 1 refills | Status: AC

## 2024-04-07 MED ORDER — DULOXETINE HCL 60 MG PO CPEP: 60.0000 mg | ORAL_CAPSULE | Freq: Every day | ORAL | 1 refills | Status: AC

## 2024-04-07 MED ORDER — HYDROXYZINE PAMOATE 25 MG PO CAPS: 25.0000 mg | ORAL_CAPSULE | Freq: Four times a day (QID) | ORAL | 0 refills | Status: DC | PRN

## 2024-04-07 MED ORDER — CELECOXIB 200 MG PO CAPS
200.0000 mg | ORAL_CAPSULE | Freq: Two times a day (BID) | ORAL | 0 refills | Status: AC
Start: 2024-04-07 — End: ?

## 2024-04-07 MED ORDER — CYCLOBENZAPRINE HCL 10 MG PO TABS: 10.0000 mg | ORAL_TABLET | Freq: Three times a day (TID) | ORAL | 2 refills | Status: DC | PRN

## 2024-04-07 NOTE — Progress Notes (Signed)
 Subjective:    Patient ID: [name], female    DOB: 1965-11-30, 58 y.o.   MRN: [id number]  Chief Complaint  Patient presents with   Medical Management of Chronic Issues    PT presents to the office today for CPE.   Pt states her mother and father passed away 2018/09/28. She states she is anxious, but doing well. She currently living with her daughter.    She has osteoporosis and her last dexa scan was 10/01/21. She is taking Fosamax  70 mg weekly.    She has hx of left hip fracture.   She is followed by ENT for cholesteatoma. Scheduled for surgery.  Hypertension This is a chronic problem. The current episode started more than 1 year ago. The problem has been resolved since onset. The problem is controlled. Associated symptoms include anxiety and malaise/fatigue. Pertinent negatives include no peripheral edema or shortness of breath. Risk factors for coronary artery disease include dyslipidemia and sedentary lifestyle. The current treatment provides moderate improvement.  Anxiety Presents for follow-up visit. Symptoms include excessive worry, nervous/anxious behavior and restlessness. Patient reports no shortness of breath. Symptoms occur occasionally. The severity of symptoms is moderate.    Depression        This is a chronic problem.  The current episode started more than 1 year ago.   The problem occurs intermittently.  Associated symptoms include restlessness.  Associated symptoms include no helplessness, no hopelessness and not sad.  Past treatments include SNRIs - Serotonin and norepinephrine reuptake inhibitors.  Past medical history includes anxiety.   Hyperlipidemia This is a chronic problem. The current episode started more than 1 year ago. The problem is uncontrolled. Recent lipid tests were reviewed and are high. Pertinent negatives include no shortness of breath. Current antihyperlipidemic treatment includes statins. The current treatment provides moderate improvement of  lipids. Risk factors for coronary artery disease include hypertension, a sedentary lifestyle and dyslipidemia.  Constipation This is a chronic problem. The current episode started more than 1 year ago. The problem has been gradually improving since onset. Her stool frequency is 1 time per day. Associated symptoms include back pain. She has tried stool softeners for the symptoms. The treatment provided moderate relief.  Back Pain This is a chronic problem. The current episode started more than 1 year ago. The problem occurs intermittently. The problem has been waxing and waning since onset. The pain is present in the lumbar spine and thoracic spine. The quality of the pain is described as aching. The pain is at a severity of 9/10. The pain is moderate. The symptoms are aggravated by standing and twisting. She has tried NSAIDs, bed rest and muscle relaxant (tylenol ) for the symptoms. The treatment provided mild relief.      Review of Systems  Constitutional:  Positive for malaise/fatigue.  Respiratory:  Negative for shortness of breath.   Gastrointestinal:  Positive for constipation.  Musculoskeletal:  Positive for back pain.  Psychiatric/Behavioral:  The patient is nervous/anxious.   All other systems reviewed and are negative.  Family History  Problem Relation Age of Onset   COPD Mother    Osteoporosis Mother    COPD Father    Osteoporosis Father    Colon cancer Neg Hx    Breast cancer Neg Hx    Social History   Socioeconomic History   Marital status: Divorced    Spouse name: Not on file   Number of children: Not on file   Years of education: Not on  file   Highest education level: Not on file  Occupational History   Not on file  Tobacco Use   Smoking status: Former    Current packs/day: 0.00    Average packs/day: 1 pack/day for 29.0 years (29.0 ttl pk-yrs)    Types: Cigarettes    Start date: 09/24/1993    Quit date: 09/25/2022    Years since quitting: 1.5   Smokeless tobacco:  Never  Vaping Use   Vaping status: Never Used  Substance and Sexual Activity   Alcohol use: No    Alcohol/week: 0.0 standard drinks of alcohol   Drug use: No   Sexual activity: Not on file  Other Topics Concern   Not on file  Social History Narrative   Not on file   Social Drivers of Health   Financial Resource Strain: Not on file  Food Insecurity: No Food Insecurity (06/23/2022)   Hunger Vital Sign    Worried About Running Out of Food in the Last Year: Never true    Ran Out of Food in the Last Year: Never true  Transportation Needs: No Transportation Needs (06/23/2022)   PRAPARE - Administrator, Civil Service (Medical): No    Lack of Transportation (Non-Medical): No  Physical Activity: Not on file  Stress: Not on file  Social Connections: Unknown (11/04/2021)   Received from [facility]   Social Network    Social Network: Not on file       Objective:   Physical Exam Vitals reviewed.  Constitutional:      General: She is not in acute distress.    Appearance: She is well-developed.  HENT:     Head: Normocephalic and atraumatic.  Eyes:     Pupils: Pupils are equal, round, and reactive to light.  Neck:     Thyroid : No thyromegaly.  Cardiovascular:     Rate and Rhythm: Normal rate and regular rhythm.     Heart sounds: Normal heart sounds. No murmur heard. Pulmonary:     Effort: Pulmonary effort is normal. No respiratory distress.     Breath sounds: Normal breath sounds. No wheezing.  Abdominal:     General: Bowel sounds are normal. There is no distension.     Palpations: Abdomen is soft.     Tenderness: There is no abdominal tenderness.  Musculoskeletal:        General: No tenderness. Normal range of motion.     Cervical back: Normal range of motion and neck supple.  Skin:    General: Skin is warm and dry.  Neurological:     Mental Status: She is alert and oriented to person, place, and time.     Cranial Nerves: No cranial nerve deficit.     Deep  Tendon Reflexes: Reflexes are normal and symmetric.  Psychiatric:        Behavior: Behavior normal.        Thought Content: Thought content normal.        Judgment: Judgment normal.      BP 121/85   Pulse 99   Temp 97.8 F (36.6 C) (Temporal)   Ht 5' 5 (1.651 m)   Wt 130 lb (59 kg)   BMI 21.63 kg/m      Assessment & Plan:   [name] comes in today with chief complaint of Medical Management of Chronic Issues   Diagnosis and orders addressed:  1. Chronic bilateral low back pain without sciatica - celecoxib  (CELEBREX ) 200 MG capsule; Take 1  capsule (200 mg total) by mouth 2 (two) times daily.  Dispense: 180 capsule; Refill: 0 - CMP14+EGFR - cyclobenzaprine  (FLEXERIL ) 10 MG tablet; Take 1 tablet (10 mg total) by mouth 3 (three) times daily as needed for muscle spasms.  Dispense: 90 tablet; Refill: 2  2. Anxiety with depression - DULoxetine  (CYMBALTA ) 60 MG capsule; Take 1 capsule (60 mg total) by mouth daily.  Dispense: 90 capsule; Refill: 1 - CMP14+EGFR  3. GAD (generalized anxiety disorder) - DULoxetine  (CYMBALTA ) 60 MG capsule; Take 1 capsule (60 mg total) by mouth daily.  Dispense: 90 capsule; Refill: 1 - hydrOXYzine  (VISTARIL ) 25 MG capsule; Take 1 capsule (25 mg total) by mouth every 6 (six) hours as needed.  Dispense: 90 capsule; Refill: 0 - CMP14+EGFR  4. Mixed hyperlipidemia - simvastatin  (ZOCOR ) 20 MG tablet; Take 1 tablet (20 mg total) by mouth daily at 6 PM.  Dispense: 90 tablet; Refill: 1 - CMP14+EGFR  5. Encounter for immunization - Flu vaccine trivalent PF, 6mos and older(Flulaval,Afluria,Fluarix,Fluzone) - CMP14+EGFR  6. Annual physical exam (Primary) - Anemia Profile B - CMP14+EGFR - Lipid panel - TSH  7. Constipation, unspecified constipation type - CMP14+EGFR  8. Depression, major, single episode, mild - CMP14+EGFR  9. Essential hypertension, benign - CMP14+EGFR  10. Other osteoporosis, unspecified pathological fracture presence -  CMP14+EGFR - DG [facility] DEXA  11. Tobacco abuse - Ambulatory Referral for Lung Cancer Screening [REF832] - CMP14+EGFR  12. Iron deficiency anemia, unspecified iron deficiency anemia type - Anemia Profile B - CMP14+EGFR  13. Screening for lung cancer - Ambulatory Referral for Lung Cancer Screening [REF832] - CMP14+EGFR   Labs pending Continue Celebrex  200 mg BID, no other NSAIDs Diclofenac  gel Will increase Flexeril  to 10 mg from 5 mg Smoking cessation discussed  Start allegra , flonase , and nasal decongestant  Keep ENT follow up for surgery  Health Maintenance reviewed Diet and exercise encouraged  Follow up plan: 6 months   Bari Learn, FNP

## 2024-04-07 NOTE — Patient Instructions (Signed)

## 2024-04-08 ENCOUNTER — Ambulatory Visit: Payer: Self-pay | Admitting: Family

## 2024-04-08 LAB — CMP14+EGFR
ALT: 14 IU/L (ref 0–32)
AST: 21 IU/L (ref 0–40)
Albumin: 4.1 g/dL (ref 3.8–4.9)
Alkaline Phosphatase: 62 IU/L (ref 49–135)
BUN/Creatinine Ratio: 13 (ref 9–23)
BUN: 9 mg/dL (ref 6–24)
Bilirubin Total: <0.2 mg/dL (ref 0.0–1.2)
CO2: 24 mmol/L (ref 20–29)
Calcium: 9.4 mg/dL (ref 8.7–10.2)
Chloride: 101 mmol/L (ref 96–106)
Creatinine, Ser: 0.67 mg/dL (ref 0.57–1.00)
Globulin, Total: 2.2 g/dL (ref 1.5–4.5)
Glucose: 95 mg/dL (ref 70–99)
Potassium: 4.3 mmol/L (ref 3.5–5.2)
Sodium: 139 mmol/L (ref 134–144)
Total Protein: 6.3 g/dL (ref 6.0–8.5)
eGFR: 101 mL/min/1.73 (ref >59)

## 2024-04-08 LAB — ANEMIA PROFILE B
Basophils Absolute: 0 x10E3/uL (ref 0.0–0.2)
Basos: 0 %
EOS (ABSOLUTE): 0.2 x10E3/uL (ref 0.0–0.4)
Eos: 2 %
Ferritin: 230 ng/mL — ABNORMAL HIGH (ref 15–150)
Folate: >20 ng/mL (ref >3.0)
Hematocrit: 38.8 % (ref 34.0–46.6)
Hemoglobin: 12.5 g/dL (ref 11.1–15.9)
Immature Grans (Abs): 0.1 x10E3/uL (ref 0.0–0.1)
Immature Granulocytes: 1 %
Iron Saturation: 39 % (ref 15–55)
Iron: 100 ug/dL (ref 27–159)
Lymphocytes Absolute: 2.2 x10E3/uL (ref 0.7–3.1)
Lymphs: 31 %
MCH: 31.8 pg (ref 26.6–33.0)
MCHC: 32.2 g/dL (ref 31.5–35.7)
MCV: 99 fL — ABNORMAL HIGH (ref 79–97)
Monocytes Absolute: 1 x10E3/uL — ABNORMAL HIGH (ref 0.1–0.9)
Monocytes: 15 %
Neutrophils Absolute: 3.5 x10E3/uL (ref 1.4–7.0)
Neutrophils: 51 %
Platelets: 377 x10E3/uL (ref 150–450)
RBC: 3.93 x10E6/uL (ref 3.77–5.28)
RDW: 11.8 % (ref 11.7–15.4)
Retic Ct Pct: 1.3 % (ref 0.6–2.6)
Total Iron Binding Capacity: 257 ug/dL (ref 250–450)
UIBC: 157 ug/dL (ref 131–425)
Vitamin B-12: 453 pg/mL (ref 232–1245)
WBC: 7 x10E3/uL (ref 3.4–10.8)

## 2024-04-08 LAB — LIPID PANEL
Chol/HDL Ratio: 3 ratio (ref 0.0–4.4)
Cholesterol, Total: 172 mg/dL (ref 100–199)
HDL: 57 mg/dL (ref >39)
LDL Chol Calc (NIH): 87 mg/dL (ref 0–99)
Triglycerides: 164 mg/dL — ABNORMAL HIGH (ref 0–149)
VLDL Cholesterol Cal: 28 mg/dL (ref 5–40)

## 2024-04-08 LAB — TSH: TSH: 0.594 u[IU]/mL (ref 0.450–4.500)

## 2024-04-12 ENCOUNTER — Other Ambulatory Visit: Payer: Self-pay | Admitting: Family

## 2024-04-12 ENCOUNTER — Ambulatory Visit (INDEPENDENT_AMBULATORY_CARE_PROVIDER_SITE_OTHER)

## 2024-04-12 DIAGNOSIS — M810 Age-related osteoporosis without current pathological fracture: Secondary | ICD-10-CM | POA: Diagnosis not present

## 2024-04-12 DIAGNOSIS — Z780 Asymptomatic menopausal state: Secondary | ICD-10-CM | POA: Diagnosis not present

## 2024-04-12 DIAGNOSIS — M818 Other osteoporosis without current pathological fracture: Secondary | ICD-10-CM

## 2024-04-12 NOTE — Telephone Encounter (Signed)
  Prescription Request  04/12/2024  Is this a Controlled Substance medicine? no  Have you seen your PCP in the last 2 weeks? yes  If YES, route message to pool  -  If NO, patient needs to be scheduled for appointment.  What is the name of the medication or equipment? nicotine  polacrilex (NICORETTE ) 4 MG gum  nicotine  (NICODERM CQ  - DOSED IN MG/24 HOURS)   Have you contacted your pharmacy to request a refill? yes   Which pharmacy would you like this sent to? [facility]    Patient notified that their request is being sent to the clinical staff for review and that they should receive a response within 2 business days.

## 2024-04-14 MED ORDER — NICOTINE 14 MG/24HR TD PT24: 14.0000 mg | MEDICATED_PATCH | Freq: Every day | TRANSDERMAL | 2 refills | Status: AC

## 2024-04-14 MED ORDER — NICOTINE POLACRILEX 4 MG MT GUM: CHEWING_GUM | OROMUCOSAL | 2 refills | Status: DC

## 2024-04-19 ENCOUNTER — Telehealth: Payer: Self-pay | Admitting: Acute Care

## 2024-04-19 NOTE — Telephone Encounter (Signed)
 Called patient and spoke with a female that states the pt is still asleep. I advised her to inform the pt to call us  back when she can to schedule her LDCT (SDMV already completed).

## 2024-04-20 ENCOUNTER — Other Ambulatory Visit: Payer: Self-pay | Admitting: Family

## 2024-04-20 DIAGNOSIS — H6993 Unspecified Eustachian tube disorder, bilateral: Secondary | ICD-10-CM

## 2024-04-27 ENCOUNTER — Other Ambulatory Visit: Payer: Self-pay

## 2024-04-27 ENCOUNTER — Emergency Department (HOSPITAL_COMMUNITY)

## 2024-04-27 ENCOUNTER — Inpatient Hospital Stay (HOSPITAL_COMMUNITY)
Admission: EM | Admit: 2024-04-27 | Discharge: 2024-05-05 | DRG: 493 | Disposition: A | Discharge status: Skilled Nursing Facility | Attending: Family Medicine | Admitting: Family Medicine

## 2024-04-27 DIAGNOSIS — S62633B Displaced fracture of distal phalanx of left middle finger, initial encounter for open fracture: Secondary | ICD-10-CM | POA: Diagnosis present

## 2024-04-27 DIAGNOSIS — S82142A Displaced bicondylar fracture of left tibia, initial encounter for closed fracture: Secondary | ICD-10-CM | POA: Diagnosis not present

## 2024-04-27 DIAGNOSIS — S61201A Unspecified open wound of left index finger without damage to nail, initial encounter: Secondary | ICD-10-CM | POA: Diagnosis not present

## 2024-04-27 DIAGNOSIS — Z885 Allergy status to narcotic agent status: Secondary | ICD-10-CM

## 2024-04-27 DIAGNOSIS — Z720 Tobacco use: Secondary | ICD-10-CM | POA: Diagnosis not present

## 2024-04-27 DIAGNOSIS — M810 Age-related osteoporosis without current pathological fracture: Secondary | ICD-10-CM | POA: Diagnosis present

## 2024-04-27 DIAGNOSIS — Z01818 Encounter for other preprocedural examination: Secondary | ICD-10-CM | POA: Diagnosis not present

## 2024-04-27 DIAGNOSIS — Z23 Encounter for immunization: Secondary | ICD-10-CM

## 2024-04-27 DIAGNOSIS — S82092A Other fracture of left patella, initial encounter for closed fracture: Secondary | ICD-10-CM | POA: Diagnosis present

## 2024-04-27 DIAGNOSIS — Z96642 Presence of left artificial hip joint: Secondary | ICD-10-CM | POA: Diagnosis present

## 2024-04-27 DIAGNOSIS — S82252A Displaced comminuted fracture of shaft of left tibia, initial encounter for closed fracture: Secondary | ICD-10-CM | POA: Diagnosis present

## 2024-04-27 DIAGNOSIS — D75839 Thrombocytosis, unspecified: Secondary | ICD-10-CM | POA: Diagnosis present

## 2024-04-27 DIAGNOSIS — M818 Other osteoporosis without current pathological fracture: Secondary | ICD-10-CM | POA: Diagnosis not present

## 2024-04-27 DIAGNOSIS — F17210 Nicotine dependence, cigarettes, uncomplicated: Secondary | ICD-10-CM | POA: Diagnosis present

## 2024-04-27 DIAGNOSIS — M85862 Other specified disorders of bone density and structure, left lower leg: Secondary | ICD-10-CM | POA: Diagnosis not present

## 2024-04-27 DIAGNOSIS — S82252D Displaced comminuted fracture of shaft of left tibia, subsequent encounter for closed fracture with routine healing: Secondary | ICD-10-CM | POA: Diagnosis not present

## 2024-04-27 DIAGNOSIS — Z7983 Long term (current) use of bisphosphonates: Secondary | ICD-10-CM | POA: Diagnosis not present

## 2024-04-27 DIAGNOSIS — S82832A Other fracture of upper and lower end of left fibula, initial encounter for closed fracture: Secondary | ICD-10-CM | POA: Diagnosis present

## 2024-04-27 DIAGNOSIS — M25469 Effusion, unspecified knee: Secondary | ICD-10-CM | POA: Diagnosis not present

## 2024-04-27 DIAGNOSIS — S82402A Unspecified fracture of shaft of left fibula, initial encounter for closed fracture: Secondary | ICD-10-CM | POA: Diagnosis not present

## 2024-04-27 DIAGNOSIS — W11XXXA Fall on and from ladder, initial encounter: Secondary | ICD-10-CM | POA: Diagnosis present

## 2024-04-27 DIAGNOSIS — S61203A Unspecified open wound of left middle finger without damage to nail, initial encounter: Secondary | ICD-10-CM | POA: Diagnosis not present

## 2024-04-27 DIAGNOSIS — M7989 Other specified soft tissue disorders: Secondary | ICD-10-CM | POA: Diagnosis not present

## 2024-04-27 DIAGNOSIS — S82142D Displaced bicondylar fracture of left tibia, subsequent encounter for closed fracture with routine healing: Secondary | ICD-10-CM | POA: Diagnosis not present

## 2024-04-27 DIAGNOSIS — D509 Iron deficiency anemia, unspecified: Secondary | ICD-10-CM | POA: Diagnosis not present

## 2024-04-27 DIAGNOSIS — Z7951 Long term (current) use of inhaled steroids: Secondary | ICD-10-CM | POA: Diagnosis not present

## 2024-04-27 DIAGNOSIS — W19XXXA Unspecified fall, initial encounter: Secondary | ICD-10-CM | POA: Diagnosis not present

## 2024-04-27 DIAGNOSIS — Z4789 Encounter for other orthopedic aftercare: Secondary | ICD-10-CM | POA: Diagnosis not present

## 2024-04-27 DIAGNOSIS — Z90710 Acquired absence of both cervix and uterus: Secondary | ICD-10-CM | POA: Diagnosis not present

## 2024-04-27 DIAGNOSIS — Y92009 Unspecified place in unspecified non-institutional (private) residence as the place of occurrence of the external cause: Secondary | ICD-10-CM | POA: Diagnosis not present

## 2024-04-27 DIAGNOSIS — D72829 Elevated white blood cell count, unspecified: Secondary | ICD-10-CM | POA: Diagnosis not present

## 2024-04-27 DIAGNOSIS — E8809 Other disorders of plasma-protein metabolism, not elsewhere classified: Secondary | ICD-10-CM | POA: Diagnosis present

## 2024-04-27 DIAGNOSIS — D62 Acute posthemorrhagic anemia: Secondary | ICD-10-CM | POA: Diagnosis not present

## 2024-04-27 DIAGNOSIS — R600 Localized edema: Secondary | ICD-10-CM | POA: Diagnosis not present

## 2024-04-27 DIAGNOSIS — S82453A Displaced comminuted fracture of shaft of unspecified fibula, initial encounter for closed fracture: Secondary | ICD-10-CM | POA: Diagnosis not present

## 2024-04-27 DIAGNOSIS — Z79899 Other long term (current) drug therapy: Secondary | ICD-10-CM | POA: Diagnosis not present

## 2024-04-27 DIAGNOSIS — I10 Essential (primary) hypertension: Secondary | ICD-10-CM | POA: Diagnosis not present

## 2024-04-27 DIAGNOSIS — S62623A Displaced fracture of medial phalanx of left middle finger, initial encounter for closed fracture: Secondary | ICD-10-CM | POA: Diagnosis not present

## 2024-04-27 DIAGNOSIS — M898X9 Other specified disorders of bone, unspecified site: Secondary | ICD-10-CM | POA: Diagnosis present

## 2024-04-27 DIAGNOSIS — Z87891 Personal history of nicotine dependence: Secondary | ICD-10-CM | POA: Diagnosis not present

## 2024-04-27 DIAGNOSIS — S82102A Unspecified fracture of upper end of left tibia, initial encounter for closed fracture: Secondary | ICD-10-CM | POA: Diagnosis not present

## 2024-04-27 DIAGNOSIS — Z8262 Family history of osteoporosis: Secondary | ICD-10-CM

## 2024-04-27 DIAGNOSIS — Z716 Tobacco abuse counseling: Secondary | ICD-10-CM

## 2024-04-27 DIAGNOSIS — S82009A Unspecified fracture of unspecified patella, initial encounter for closed fracture: Secondary | ICD-10-CM | POA: Diagnosis present

## 2024-04-27 DIAGNOSIS — K5900 Constipation, unspecified: Secondary | ICD-10-CM | POA: Diagnosis present

## 2024-04-27 DIAGNOSIS — F411 Generalized anxiety disorder: Secondary | ICD-10-CM | POA: Diagnosis not present

## 2024-04-27 DIAGNOSIS — S82143A Displaced bicondylar fracture of unspecified tibia, initial encounter for closed fracture: Secondary | ICD-10-CM | POA: Diagnosis not present

## 2024-04-27 DIAGNOSIS — E871 Hypo-osmolality and hyponatremia: Secondary | ICD-10-CM | POA: Diagnosis not present

## 2024-04-27 DIAGNOSIS — E785 Hyperlipidemia, unspecified: Secondary | ICD-10-CM | POA: Diagnosis present

## 2024-04-27 DIAGNOSIS — Z22322 Carrier or suspected carrier of Methicillin resistant Staphylococcus aureus: Secondary | ICD-10-CM

## 2024-04-27 DIAGNOSIS — S61205A Unspecified open wound of left ring finger without damage to nail, initial encounter: Secondary | ICD-10-CM | POA: Diagnosis not present

## 2024-04-27 DIAGNOSIS — S62617A Displaced fracture of proximal phalanx of left little finger, initial encounter for closed fracture: Secondary | ICD-10-CM | POA: Diagnosis not present

## 2024-04-27 DIAGNOSIS — S82452A Displaced comminuted fracture of shaft of left fibula, initial encounter for closed fracture: Secondary | ICD-10-CM | POA: Diagnosis not present

## 2024-04-27 DIAGNOSIS — I700 Atherosclerosis of aorta: Secondary | ICD-10-CM | POA: Diagnosis not present

## 2024-04-27 DIAGNOSIS — S61412A Laceration without foreign body of left hand, initial encounter: Secondary | ICD-10-CM | POA: Diagnosis not present

## 2024-04-27 LAB — BASIC METABOLIC PANEL WITH GFR
Anion gap: 9 (ref 5–15)
BUN: 14 mg/dL (ref 6–20)
CO2: 27 mmol/L (ref 22–32)
Calcium: 8.9 mg/dL (ref 8.9–10.3)
Chloride: 100 mmol/L (ref 98–111)
Creatinine, Ser: 0.7 mg/dL (ref 0.44–1.00)
GFR, Estimated: >60 mL/min (ref >60)
Glucose, Bld: 132 mg/dL — ABNORMAL HIGH (ref 70–99)
Potassium: 4.1 mmol/L (ref 3.5–5.1)
Sodium: 136 mmol/L (ref 135–145)

## 2024-04-27 LAB — CBC WITH DIFFERENTIAL/PLATELET
Abs Immature Granulocytes: 0.08 K/uL — ABNORMAL HIGH (ref 0.00–0.07)
Basophils Absolute: 0 K/uL (ref 0.0–0.1)
Basophils Relative: 0 %
Eosinophils Absolute: 0 K/uL (ref 0.0–0.5)
Eosinophils Relative: 0 %
HCT: 35.4 % — ABNORMAL LOW (ref 36.0–46.0)
Hemoglobin: 11.8 g/dL — ABNORMAL LOW (ref 12.0–15.0)
Immature Granulocytes: 1 %
Lymphocytes Relative: 7 %
Lymphs Abs: 1.3 K/uL (ref 0.7–4.0)
MCH: 32.4 pg (ref 26.0–34.0)
MCHC: 33.3 g/dL (ref 30.0–36.0)
MCV: 97.3 fL (ref 80.0–100.0)
Monocytes Absolute: 1.4 K/uL — ABNORMAL HIGH (ref 0.1–1.0)
Monocytes Relative: 8 %
Neutro Abs: 14.7 K/uL — ABNORMAL HIGH (ref 1.7–7.7)
Neutrophils Relative %: 84 %
Platelets: 392 K/uL (ref 150–400)
RBC: 3.64 MIL/uL — ABNORMAL LOW (ref 3.87–5.11)
RDW: 12.7 % (ref 11.5–15.5)
WBC: 17.5 K/uL — ABNORMAL HIGH (ref 4.0–10.5)
nRBC: 0 % (ref 0.0–0.2)

## 2024-04-27 MED ORDER — OXYCODONE HCL 5 MG PO TABS
5.0000 mg | ORAL_TABLET | ORAL | Status: DC | PRN
  Administered 2024-04-28: 5 mg via ORAL
  Filled 2024-04-27: qty 1

## 2024-04-27 MED ORDER — MELATONIN 3 MG PO TABS
3.0000 mg | ORAL_TABLET | Freq: Every evening | ORAL | Status: DC | PRN
  Administered 2024-04-28 – 2024-05-04 (×7): 3 mg via ORAL
  Filled 2024-04-27 (×7): qty 1

## 2024-04-27 MED ORDER — MORPHINE SULFATE (PF) 2 MG/ML IV SOLN
2.0000 mg | INTRAVENOUS | Status: DC | PRN
  Administered 2024-04-28 (×2): 2 mg via INTRAVENOUS
  Filled 2024-04-27 (×2): qty 1

## 2024-04-27 MED ORDER — ACETAMINOPHEN 500 MG PO TABS
500.0000 mg | ORAL_TABLET | Freq: Four times a day (QID) | ORAL | Status: DC | PRN
Start: 2024-04-27 — End: 2024-04-28

## 2024-04-27 MED ORDER — CEFAZOLIN SODIUM-DEXTROSE 1-4 GM/50ML-% IV SOLN
2.0000 g | Freq: Once | INTRAVENOUS | Status: DC
Start: 2024-04-27 — End: 2024-04-27
  Filled 2024-04-27 (×2): qty 100

## 2024-04-27 MED ORDER — HYDROMORPHONE HCL 1 MG/ML IJ SOLN
1.0000 mg | INTRAMUSCULAR | Status: AC
  Administered 2024-04-27: 1 mg via INTRAVENOUS
  Filled 2024-04-27: qty 1

## 2024-04-27 MED ORDER — LIDOCAINE HCL (PF) 1 % IJ SOLN
30.0000 mL | Freq: Once | INTRAMUSCULAR | Status: AC
  Administered 2024-04-27: 30 mL
  Filled 2024-04-27: qty 30

## 2024-04-27 MED ORDER — MORPHINE SULFATE (PF) 4 MG/ML IV SOLN
4.0000 mg | Freq: Once | INTRAVENOUS | Status: AC
  Administered 2024-04-27: 4 mg via INTRAVENOUS
  Filled 2024-04-27: qty 1

## 2024-04-27 MED ORDER — ONDANSETRON HCL 4 MG/2ML IJ SOLN
4.0000 mg | Freq: Once | INTRAMUSCULAR | Status: AC
  Administered 2024-04-27: 4 mg via INTRAVENOUS

## 2024-04-27 MED ORDER — LACTATED RINGERS IV SOLN: INTRAVENOUS | Status: AC

## 2024-04-27 MED ORDER — CEFAZOLIN SODIUM-DEXTROSE 2-4 GM/100ML-% IV SOLN
2.0000 g | Freq: Three times a day (TID) | INTRAVENOUS | Status: DC
  Administered 2024-04-27 – 2024-04-29 (×5): 2 g via INTRAVENOUS
  Filled 2024-04-27 (×4): qty 100

## 2024-04-27 MED ORDER — PROCHLORPERAZINE EDISYLATE 10 MG/2ML IJ SOLN
10.0000 mg | INTRAMUSCULAR | Status: DC | PRN
  Administered 2024-04-28 – 2024-05-05 (×4): 10 mg via INTRAVENOUS
  Filled 2024-04-27 (×4): qty 2

## 2024-04-27 MED ORDER — FENTANYL CITRATE (PF) 100 MCG/2ML IJ SOLN
50.0000 ug | Freq: Once | INTRAMUSCULAR | Status: AC
  Administered 2024-04-27: 50 ug via INTRAVENOUS
  Filled 2024-04-27: qty 2

## 2024-04-27 MED ORDER — POLYETHYLENE GLYCOL 3350 17 G PO PACK: 17.0000 g | PACK | Freq: Every day | ORAL | Status: DC | PRN

## 2024-04-27 NOTE — ED Notes (Signed)
 Per [facility], Pt has had 200 mcg of IV fentanyl  and 4 mg of IV zofran  20 minutes prior to arriving to the ED

## 2024-04-27 NOTE — H&P (Incomplete)
 History and Physical  RISSA TURLEY FMW:[id number] DOB: 04-13-1966 DOA: 04/27/2024  Referring physician: Dr. Yolande, EDP  PCP: Lavell Bari [name], FNP  Outpatient Specialists: Pulmonary, ENT. Patient coming from: Home.  Chief Complaint: Fall.  HPI: [name] is a 58 y.o. female with medical history significant for hyperlipidemia, chronic anxiety, osteoporosis, current tobacco smoker, who presents to the ER via EMS from home after a mechanical fall.  The patient was on a stepstool, taking down her halloween decorations when she lost her balance and fell.  No head injury or loss of consciousness.  States she was doing well prior to this fall.  No dizziness, chest pain, or palpitations.  No GI or urinary symptoms.  She had 2 falls in the past which resulted in fractures.  They were also mechanical falls.  First 1 was at least 10 years ago, broke her left knee and the second one was nearly 2 years ago, after tripping on her grandson toy, broke her left hip.  In the ER, she was noted to have deformity and laceration to her left middle finger with fracture.  Imaging revealed comminuted proximal tibia and fibula fracture.  No signs of compartment syndrome.  EDP discussed the case with orthopedic surgery, Dr. Onesimo, who recommended admission and transfer to [facility] for possible orthopedic surgical intervention.   The patient received IV cefazolin  for her open left middle finger fracture.   Admitted by [facility], hospitalist service.  At the time of this visit, the patient is alert and oriented x 3.  She is receiving IV opiate-based analgesics with improvement of her pain.  ED Course: Blood pressure 128/89, pulse 104, respiration rate 14, O2 saturation 93% on room air.  Review of Systems: Review of systems as noted in the HPI. All other systems reviewed and are negative.   Past Medical History:  Diagnosis Date   Anemia    Anxiety    Hyperlipidemia    Hypertension    Osteoporosis     Past Surgical History:  Procedure Laterality Date   ABDOMINAL HYSTERECTOMY     Partial   HARDWARE REMOVAL Left 06/20/2015   Procedure: HARDWARE REMOVAL;  Surgeon: Taft FORBES Minerva, MD;  Location: [facility];  Service: Orthopedics;  Laterality: Left;  left knee   ORIF PATELLA Left 12/29/2014   Procedure: OPEN REDUCTION INTERNAL FIXATION LEFT PATELLA;  Surgeon: Taft FORBES Minerva, MD;  Location: [facility];  Service: Orthopedics;  Laterality: Left;   TOTAL HIP ARTHROPLASTY Left 06/23/2022   Procedure: TOTAL HIP ARTHROPLASTY ANTERIOR APPROACH;  Surgeon: Fidel Rogue, MD;  Location: [facility] OR;  Service: Orthopedics;  Laterality: Left;    Social History:  reports that she quit smoking about 19 months ago. Her smoking use included cigarettes. She started smoking about 30 years ago. She has a 29 pack-year smoking history. She has never used smokeless tobacco. She reports that she does not drink alcohol and does not use drugs.   Allergies  Allergen Reactions   Codeine Other (See Comments)    Makes pt.have seizure   Hydrocodone Other (See Comments)    May have caused a seizure per patient reported.    Family History  Problem Relation Age of Onset   COPD Mother    Osteoporosis Mother    COPD Father    Osteoporosis Father    Colon cancer Neg Hx    Breast cancer Neg Hx       Prior to Admission medications   Medication Sig Start Date  End Date Taking? Authorizing Provider  acetaminophen  (TYLENOL ) 650 MG CR tablet Take 1,300 mg by mouth every 8 (eight) hours as needed for pain.   Yes [provider]  alendronate  (FOSAMAX ) 70 MG tablet TAKE 1 TABLET BY MOUTH ONCE A WEEK. TAKE WITH A FULL GLASS OF WATER  ON AN EMPTY STOMACH. Patient taking differently: Take 70 mg by mouth every Tuesday. 12/14/23  Yes Hawks, Christy A, FNP  calcium -vitamin D  (OSCAL WITH D) 500-200 MG-UNIT tablet Take 1 tablet by mouth daily. 04/23/16  Yes Eckard, Tammy, RPH-CPP  celecoxib  (CELEBREX ) 200 MG capsule Take 1 capsule  (200 mg total) by mouth 2 (two) times daily. 04/07/24  Yes Hawks, Christy A, FNP  cyclobenzaprine  (FLEXERIL ) 10 MG tablet Take 1 tablet (10 mg total) by mouth 3 (three) times daily as needed for muscle spasms. 04/07/24  Yes Hawks, Christy A, FNP  diclofenac  Sodium (VOLTAREN ) 1 % GEL Apply 2 g topically 4 (four) times daily. Patient taking differently: Apply 2 g topically as needed (pain). 10/02/23  Yes Hawks, Christy A, FNP  docusate sodium  (COLACE) 100 MG capsule Take 100 mg by mouth daily.   Yes [provider]  DULoxetine  (CYMBALTA ) 60 MG capsule Take 1 capsule (60 mg total) by mouth daily. 04/07/24  Yes Hawks, Christy A, FNP  fexofenadine  (ALLEGRA ) 180 MG tablet TAKE 1 TABLET (180 MG TOTAL) BY MOUTH DAILY. OTC 04/21/24  Yes Hawks, Christy A, FNP  fluticasone  (FLONASE ) 50 MCG/ACT nasal spray SPRAY 2 SPRAYS INTO EACH NOSTRIL EVERY DAY 09/21/23  Yes Hawks, Christy A, FNP  hydrOXYzine  (VISTARIL ) 25 MG capsule Take 1 capsule (25 mg total) by mouth every 6 (six) hours as needed. Patient taking differently: Take 25 mg by mouth every 6 (six) hours as needed for anxiety or itching. 04/07/24  Yes Hawks, Christy A, FNP  Magnesium  Glycinate 120 MG CAPS Take 1 capsule by mouth daily.   Yes [provider]  nicotine  (NICODERM CQ  - DOSED IN MG/24 HOURS) 14 mg/24hr patch Place 1 patch (14 mg total) onto the skin daily. Patient taking differently: Place 14 mg onto the skin as needed (smoking cessation). 04/14/24  Yes Hawks, Christy A, FNP  nicotine  polacrilex (NICORETTE ) 4 MG gum CHEW 1 EACH (4 MG TOTAL) AS DIRECTED BY MOUTH AS NEEDED FOR SMOKING CESSATION. Patient taking differently: Take 4 mg by mouth as needed for smoking cessation. CHEW 1 EACH (4 MG TOTAL) AS DIRECTED BY MOUTH AS NEEDED FOR SMOKING CESSATION. 04/14/24  Yes Hawks, Christy A, FNP  simvastatin  (ZOCOR ) 20 MG tablet Take 1 tablet (20 mg total) by mouth daily at 6 PM. 04/07/24  Yes Hawks, Bari LABOR, FNP    Physical Exam: BP (!)  145/88   Pulse 88   Temp 97.8 F (36.6 C) (Oral)   Resp 20   SpO2 96%   General: 58 y.o. year-old female well developed well nourished in no acute distress.  Alert and oriented x3. Cardiovascular: Regular rate and rhythm with no rubs or gallops.  No thyromegaly or JVD noted.  No lower extremity edema. 2/4 pulses in all 4 extremities. Respiratory: Clear to auscultation with no wheezes or rales. Good inspiratory effort. Abdomen: Soft nontender nondistended with normal bowel sounds x4 quadrants. Muskuloskeletal: No cyanosis, clubbing or edema noted bilaterally.  Left knee in immobilizer. Neuro: CN II-XII intact, strength, sensation, reflexes Skin: No ulcerative lesions noted or rashes.  Laceration to left middle finger. Psychiatry: Judgement and insight appear normal. Mood is appropriate for condition and setting  Labs on Admission:  Basic Metabolic Panel: Recent Labs  Lab 04/27/24 1833  NA 136  K 4.1  CL 100  CO2 27  GLUCOSE 132*  BUN 14  CREATININE 0.70  CALCIUM  8.9   Liver Function Tests: No results for input(s): AST, ALT, ALKPHOS, BILITOT, PROT, ALBUMIN in the last 168 hours. No results for input(s): LIPASE, AMYLASE in the last 168 hours. No results for input(s): AMMONIA in the last 168 hours. CBC: Recent Labs  Lab 04/27/24 1833  WBC 17.5*  NEUTROABS 14.7*  HGB 11.8*  HCT 35.4*  MCV 97.3  PLT 392   Cardiac Enzymes: No results for input(s): CKTOTAL, CKMB, CKMBINDEX, TROPONINI in the last 168 hours.  BNP (last 3 results) No results for input(s): BNP in the last 8760 hours.  ProBNP (last 3 results) No results for input(s): PROBNP in the last 8760 hours.  CBG: No results for input(s): GLUCAP in the last 168 hours.  Radiological Exams on Admission: CT Knee Left Wo Contrast Result Date: 04/27/2024 CLINICAL DATA:  Trauma to the left knee. EXAM: CT OF THE LEFT KNEE WITHOUT CONTRAST TECHNIQUE: Multidetector CT imaging of the  left knee was performed according to the standard protocol. Multiplanar CT image reconstructions were also generated. RADIATION DOSE REDUCTION: This exam was performed according to the departmental dose-optimization program which includes automated exposure control, adjustment of the mA and/or kV according to patient size and/or use of iterative reconstruction technique. COMPARISON:  Radiograph dated 04/27/2024. FINDINGS: Bones/Joint/Cartilage Comminuted and intra-articular fracture of the proximal tibia involving the tibial spine, medial and lateral tibial plateau and extending into the proximal tibial diaphysis. There is depression of the lateral tibial plateau. There is comminuted and displaced fracture of the fibular head and neck. Prior fixation of the patella. There is severe osteopenia. No dislocation. Large suprapatellar lipohemarthrosis. Ligaments Suboptimally assessed by CT. Muscles and Tendons There is edema of the musculature of the calf.  No fluid collection. Soft tissues Subcutaneous stranding of the anterior and medial calf. IMPRESSION: 1. Comminuted intra-articular fracture of the proximal tibia as well as displaced comminuted fracture of the fibular head and neck. No dislocation. 2. Large suprapatellar lipohemarthrosis. Electronically Signed   By: Vanetta Chou M.D.   On: 04/27/2024 19:49   DG Hand Complete Left Result Date: 04/27/2024 EXAM: 3 OR MORE VIEW(S) XRAY OF THE LEFT HAND 04/27/2024 07:20:30 PM COMPARISON: 04/27/2024 CLINICAL HISTORY: post reduction FINDINGS: BONES AND JOINTS: Acute intra-articular fracture of the distal aspect of the third digit middle phalanx. Improved alignment of the third DIP joint status post reduction. Suspicion of small intraarticular fracture at the base of the third distal phalanx. Chondrocalcinosis of the TFCC. SOFT TISSUES: Soft tissue edema. IMPRESSION: 1. Acute intra-articular fracture of the distal aspect of the third digit middle phalanx and suspected  small intraarticular fracture at the base of the third distal phalanx. Reduction of third digit dip dislocation now with normal alignment . 2. Normal alignment fifth digit. Suspected fracture of the distal aspect of the fifth proximal phalanx is not well seen post splinting. Electronically signed by: Luke Bun MD 04/27/2024 07:28 PM EST RP Workstation: [id number]   DG Chest Portable 1 View Result Date: 04/27/2024 EXAM: 1 VIEW(S) XRAY OF THE CHEST 04/27/2024 06:29:00 PM COMPARISON: 04/06/2023 CLINICAL HISTORY: pre op FINDINGS: LUNGS AND PLEURA: No focal pulmonary opacity. No pulmonary edema. No pleural effusion. No pneumothorax. HEART AND MEDIASTINUM: Aortic arch calcifications. BONES AND SOFT TISSUES: Chronic midthoracic vertebral body compression fracture. IMPRESSION: 1. No acute cardiopulmonary  abnormality. Electronically signed by: Luke Bun MD 04/27/2024 06:49 PM EST RP Workstation: [id number]   DG Hand Complete Left Result Date: 04/27/2024 EXAM: 3 OR MORE VIEW(S) XRAY OF THE LEFT HAND 04/27/2024 05:35:00 PM COMPARISON: None available. CLINICAL HISTORY: fall, deformity FINDINGS: BONES AND JOINTS: Acute displaced intra-articular fracture of the distal aspect of the third digit middle phalanx. Acute displaced fracture of the head of the fifth digit proximal phalanx. Dislocation at the third DIP joint with the distal phalanx displaced to the radial side of the middle phalanx and directed laterally. Cardiogenic calcification at the wrist. Flexion deformity of the fifth digit proximal interphalangeal joint. Scattered degenerative changes. SOFT TISSUES: Soft tissue swelling and laceration. IMPRESSION: 1. Acute displaced intra-articular fracture of the distal/head of third middle phalanx with associated distal interphalangeal joint dislocation and lateral/radial displacement of the distal phalanx with respect to the middle phalanx . 2. Acute displaced fracture of the head of the fifth proximal phalanx. 3.  Soft tissue swelling and laceration. Electronically signed by: Luke Bun MD 04/27/2024 06:20 PM EST RP Workstation: [id number]   DG Knee Complete 4 Views Left Result Date: 04/27/2024 EXAM: 4 VIEW(S) XRAY OF THE LEFT KNEE 04/27/2024 05:35:00 PM COMPARISON: 07/16/2022 CLINICAL HISTORY: fall, deformity FINDINGS: BONES AND JOINTS: Patellar cerclage wire in place. Comminuted proximal tibial fracture with intra-articular extension. Displacement and slight depression of the posterior lateral tibial plateau. Comminuted fibular head and neck fracture with displacement and mild angulation. Lipohemarthrosis. Linear artifact projects over the distal shaft of the femur and the proximal lower leg. SOFT TISSUES: Surrounding soft tissue swelling. IMPRESSION: 1. Comminuted proximal tibial fracture with intra-articular extension, with displacement and slight depression of the posterior, lateral tibial plateau. 2. Comminuted fibular head and neck fracture with displacement and mild angulation. 3. Lipohemarthrosis. Electronically signed by: Luke Bun MD 04/27/2024 06:13 PM EST RP Workstation: [id number]   DG Tibia/Fibula Left Result Date: 04/27/2024 EXAM: _VIEWS_ VIEW(S) XRAY OF THE LEFT TIBIA AND FIBULA 04/27/2024 05:35:00 PM COMPARISON: None available. CLINICAL HISTORY: fall, deformity FINDINGS: BONES AND JOINTS: Patellar cerclage wires in place. Comminuted proximal tibial fracture with possible intra-articular extension. Displaced lateral tibial plateau. Comminuted fibular head and neck fracture. Joint effusion with lipohemarthrosis. SOFT TISSUES: The soft tissues are unremarkable. IMPRESSION: 1. Highly comminuted intraarticular proximal tibial fracture with lipohemarthrosis. 2. Comminuted fibular head and neck fractures. Electronically signed by: Luke Bun MD 04/27/2024 06:10 PM EST RP Workstation: [id number]    EKG: I independently viewed the EKG done and my findings are as followed: Sinus rhythm rate of 85.   Nonspecific ST-T changes.  QTc 467.  Assessment/Plan Present on Admission:  Closed fracture of left tibial plateau, initial encounter  Principal Problem:   Closed fracture of left tibial plateau, initial encounter  Closed fracture of left tibia plateau, initial encounter Secondary to mechanical fall  Additionally, imaging revealed comminuted proximal tibia and fibula fracture.  No signs of compartment syndrome.  Continue pain control as needed and bowel regimen as needed. Orthopedic surgery consulted  Open left middle finger fracture, POA Irrigated and washed out in the ER Continue IV cefazolin  for her open left middle finger fracture, until seen by orthopedic surgery. Follow peripheral blood cultures x 2  Leukocytosis, likely secondary to fracture of left tibia/fibula, laceration of left third finger, POA Presented with WBC 17, neutrophilia 14, and bands Follow-up peripheral blood culture x 2 Continue IV antibiotics for now, until seen by orthopedic surgery Monitor for fever and WBCs.  Generalized anxiety disorder Resume home regimen.  Hyperlipidemia Resume home regimen.  Osteoporosis Takes alendronate  every Tuesday, prior to admission. Continue fall precautions.  Tobacco use disorder Recommend complete cessation of tobacco use. Nicotine  patch daily to prevent withdrawal.   Time: 75 minutes.   DVT prophylaxis: SCDs.  Code Status: Full code.  Family Communication: None at bedside.  Disposition Plan: Admitted to MedSurg unit at [facility].  Consults called: Orthopedic surgery consulted by EDP.  Admission status: Inpatient status.   Status is: Inpatient The patient requires at least 2 midnights for further evaluation and treatment of present condition.   Terry LOISE Hurst MD [facility] Pager [phone]  If 7PM-7AM, please contact night-coverage [email or web address] Password [email or web address]  04/27/2024, 8:15 PM

## 2024-04-27 NOTE — ED Triage Notes (Signed)
 Pt arrived via [employer] c/o a fall off a step stool while taking down her [employer]. C/o L hand middle finger pain, as well as middle finger deformity after the fall. Also c/o L knee pain and swelling. Pt has broken that knee in the past, denies hitting head and denies use of blood thinners, denies LOC

## 2024-04-27 NOTE — ED Notes (Signed)
 Patients left middle finger is bent at a 90 degree angle with extreme pain, and her left knee has moderate swelling with distal pulses noted.

## 2024-04-27 NOTE — ED Provider Notes (Signed)
 [place] EMERGENCY DEPARTMENT AT [facility] Provider Note   CSN: [id number] Arrival date & time: 04/27/24  1610     Patient presents with: [name] Claudene is a 58 y.o. female.  She has a history of anxiety, hyperlipidemia, hypertension, anemia prior left hip fracture, prior surgery left knee.  Presents to ER complaining of left hand deformity and left knee pain and swelling after fall.  She states she was on a two-step stepladder taking down Halloween decorations, and felt a cracking sensation of the left knee and fell off a ladder to palmar left hand.  Denies head injury or loss of consciousness, no neck pain, no back pain or hip pain.  She presents via EMS.    Fall       Prior to Admission medications   Medication Sig Start Date End Date Taking? Authorizing Provider  alendronate  (FOSAMAX ) 70 MG tablet TAKE 1 TABLET BY MOUTH ONCE A WEEK. TAKE WITH A FULL GLASS OF WATER  ON AN EMPTY STOMACH. 12/14/23   Lavell Lye A, FNP  calcium -vitamin D  (OSCAL WITH D) 500-200 MG-UNIT tablet Take 1 tablet by mouth daily. Patient not taking: Reported on 04/07/2024 04/23/16   Carla Milling, RPH-CPP  celecoxib  (CELEBREX ) 200 MG capsule Take 1 capsule (200 mg total) by mouth 2 (two) times daily. 04/07/24   Lavell Lye [name], FNP  cyclobenzaprine  (FLEXERIL ) 10 MG tablet Take 1 tablet (10 mg total) by mouth 3 (three) times daily as needed for muscle spasms. 04/07/24   Lavell Lye A, FNP  diclofenac  Sodium (VOLTAREN ) 1 % GEL Apply 2 g topically 4 (four) times daily. 10/02/23   Lavell Lye A, FNP  docusate sodium  (COLACE) 100 MG capsule Take 100 mg by mouth daily.    [provider]  DULoxetine  (CYMBALTA ) 60 MG capsule Take 1 capsule (60 mg total) by mouth daily. 04/07/24   Hawks, Lye A, FNP  fexofenadine  (ALLEGRA ) 180 MG tablet TAKE 1 TABLET (180 MG TOTAL) BY MOUTH DAILY. OTC 04/21/24   Lavell Lye A, FNP  fluticasone  (FLONASE ) 50 MCG/ACT nasal spray SPRAY 2 SPRAYS  INTO EACH NOSTRIL EVERY DAY 09/21/23   Lavell Lye A, FNP  folic acid  (FOLVITE ) 1 MG tablet Take 1 tablet (1 mg total) by mouth daily. Patient not taking: Reported on 04/07/2024 06/26/22   Pokhrel, Laxman, MD  hydrOXYzine  (VISTARIL ) 25 MG capsule Take 1 capsule (25 mg total) by mouth every 6 (six) hours as needed. 04/07/24   Lavell Lye A, FNP  nicotine  (NICODERM CQ  - DOSED IN MG/24 HOURS) 14 mg/24hr patch Place 1 patch (14 mg total) onto the skin daily. 04/14/24   Lavell Lye A, FNP  nicotine  polacrilex (NICORETTE ) 4 MG gum CHEW 1 EACH (4 MG TOTAL) AS DIRECTED BY MOUTH AS NEEDED FOR SMOKING CESSATION. 04/14/24   Lavell Lye [name], FNP  simvastatin  (ZOCOR ) 20 MG tablet Take 1 tablet (20 mg total) by mouth daily at 6 PM. 04/07/24   Lavell Lye [name], FNP    Allergies: Codeine and Hydrocodone    Review of Systems  Updated Vital Signs BP 131/88   Pulse 79   Temp 97.8 F (36.6 C) (Oral)   Resp 20   SpO2 97%   Physical Exam Vitals and nursing note reviewed.  Constitutional:      General: She is not in acute distress.    Appearance: She is well-developed.  HENT:     Head: Normocephalic and atraumatic.  Eyes:     Conjunctiva/sclera: Conjunctivae  normal.  Cardiovascular:     Rate and Rhythm: Normal rate and regular rhythm.     Heart sounds: No murmur heard. Pulmonary:     Effort: Pulmonary effort is normal. No respiratory distress.     Breath sounds: Normal breath sounds.  Abdominal:     Palpations: Abdomen is soft.     Tenderness: There is no abdominal tenderness.  Musculoskeletal:     Cervical back: Neck supple.     Comments: There is severe swelling to the proximal anterior left lower leg with ecchymosis.  Compartments are supple.  Patient has significant pain to the area.  Tenderness to the hip or ankle.  DP and PT pulses on left foot are intact, capillary fill is brisk Emina toes of the left foot.  Left hand has obvious deformity to the middle finger, the DIP joint is  dislocated and angled radially.  The little finger has tenderness and deformity of the proximal phalanx.  There is crepitus of the proximal phalanx as well.  Skin:    General: Skin is warm and dry.     Capillary Refill: Capillary refill takes less than 2 seconds.     Comments: Has overall skin to all of fingers, rings were on multiple fingers that were cut off and given to family.  Overlying the obvious deformity of the DIP joint of the middle finger there is a deeper laceration on the ulnar side of the finger with mild bleeding.  Neurological:     Mental Status: She is alert.  Psychiatric:        Mood and Affect: Mood normal.     (all labs ordered are listed, but only abnormal results are displayed) Labs Reviewed - No data to display  EKG: None  Radiology: No results found.   [facility] Injury Treatment  Date/Time: 04/27/2024 7:12 PM  Performed by: Suellen Sherran LABOR, PA-C Authorized by: Suellen Sherran LABOR, PA-C   Consent:    Consent obtained:  Verbal   Consent given by:  Patient   Risks discussed:  Fracture, nerve damage, recurrent dislocation, stiffness, irreducible dislocation, restricted joint movement and vascular damageInjury location: finger Location details: left long finger Injury type: fracture-dislocation Fracture type: distal phalanx Any IP joint involved: yes Pre-procedure neurovascular assessment: neurovascularly intact Pre-procedure distal perfusion: normal Pre-procedure neurological function: normal Pre-procedure range of motion: reduced Anesthesia: digital block  Anesthesia: Local anesthesia used: yes Local Anesthetic: lidocaine  1% without epinephrine   Patient sedated: NoManipulation performed: yes Reduction successful: yes X-ray confirmed reduction: yes Immobilization: splint      Medications Ordered in the ED - No data to display  Clinical Course as of 04/27/24 1923  Wed Apr 27, 2024  1921 Assumed primary care of the patient.  58 year old female  with a comminuted proximal tibia and fibula fracture.  No signs of compartment syndrome.  Does also have an open left middle finger fracture.  Given Ancef .  Discussed with Dr. Onesimo from orthopedics who recommends transfer to [facility].  Also recommended knee immobilizer and CT scan of the knee. [RP]    Clinical Course User Index [RP] Yolande Lamar BROCKS, MD                                 Medical Decision Making DDX includes but not limited to fracture, dislocation, sprain, strain, open fracture, other  ED course: Patient had a fall from a stepladder today only 2 steps up and having  severe pain and swelling to left lower leg proximally and having deformity to left hand.  No head injury or loss of consciousness.  Not on blood thinners.  She has no other pain.  No numbness tingling or weakness.  Compartments of her leg are soft.  She was given IV pain medicine.  She had some strong dirt on her hand which was cleaned off.  X-ray showed a fracture of the middle phalanx of the middle finger with dislocation to the DIP joint adjacent to this.  Appears to be an open fracture.  Irrigated and reduced.  X-rays show comminuted proximal tibia fracture with tibial plateau fracture left tibia and left proximal fibula fracture that is comminuted and angulated as well.  Consulted Dr. Onesimo on-call for orthopedics.  He was able to view the image of patient's finger and also her imaging.  He discussed the case with Dr. Kendal in [place], and transferred to [facility] and hospitalist service admission.  Regarding her open fracture of her finger, he does not think she needs a washout in the OR.  He recommended irrigation with a liter of normal saline, dressed with Xeroform and resting volar splint.  Was given IV Ancef  as well, her tetanus is up-to-date.  Tammy Triplett, PA-C is kindly going to perform the irrigation at my time of signout.   Signed out to Dr. Jakie at this time for hospitalist  admission.   Amount and/or Complexity of Data Reviewed Labs: ordered.    Details: CAD with leukocytosis likely from trauma, hemoglobin 11.8, BMP normal Radiology: ordered and independent interpretation performed. Decision-making details documented in ED Course.  Risk Prescription drug management. Decision regarding hospitalization.        Final diagnoses:  None    ED Discharge Orders     None          Suellen Sherran Dixon DEVONNA 04/27/24 1931    Yolande Lamar BROCKS, MD 04/27/24 2046

## 2024-04-27 NOTE — ED Notes (Signed)
 Patient transported to CT

## 2024-04-27 NOTE — ED Provider Notes (Signed)
  Physical Exam  BP (!) 145/88   Pulse 88   Temp 97.8 F (36.6 C) (Oral)   Resp 20   SpO2 96%   Physical Exam  Procedures  Procedures  ED Course / MDM   Clinical Course as of 04/27/24 2045  Wed Apr 27, 2024  1921 Assumed primary care of the patient.  58 year old female with a comminuted proximal tibia and fibula fracture.  No signs of compartment syndrome.  Does also have an open left middle finger fracture.  Given Ancef .  Discussed with Dr. Onesimo from orthopedics who recommends transfer to [facility].  Also recommended knee immobilizer and CT scan of the knee. [RP]  2044 Patient reassessed.  Compartments of calf appear soft.  Has received the antibiotics and did discuss the open fracture of her finger with orthopedic surgery who recommends admission over to [facility] but states that she does not need to go for emergent washout for this.  Discussed with Dr. Shona from hospitalist will admit the patient. [RP]    Clinical Course User Index [RP] Yolande Lamar BROCKS, MD   Medical Decision Making Amount and/or Complexity of Data Reviewed Labs: ordered. Radiology: ordered.  Risk Prescription drug management. Decision regarding hospitalization.      Yolande Lamar BROCKS, MD 04/27/24 2046

## 2024-04-27 NOTE — ED Notes (Signed)
 AC contacted for cefazolin 

## 2024-04-28 ENCOUNTER — Encounter (HOSPITAL_COMMUNITY): Payer: Self-pay | Admitting: Internal Medicine

## 2024-04-28 DIAGNOSIS — D72829 Elevated white blood cell count, unspecified: Secondary | ICD-10-CM | POA: Diagnosis not present

## 2024-04-28 DIAGNOSIS — S82142A Displaced bicondylar fracture of left tibia, initial encounter for closed fracture: Secondary | ICD-10-CM | POA: Diagnosis not present

## 2024-04-28 LAB — BASIC METABOLIC PANEL WITH GFR
Anion gap: 9 (ref 5–15)
BUN: 14 mg/dL (ref 6–20)
CO2: 27 mmol/L (ref 22–32)
Calcium: 8.6 mg/dL — ABNORMAL LOW (ref 8.9–10.3)
Chloride: 99 mmol/L (ref 98–111)
Creatinine, Ser: 0.67 mg/dL (ref 0.44–1.00)
GFR, Estimated: >60 mL/min (ref >60)
Glucose, Bld: 127 mg/dL — ABNORMAL HIGH (ref 70–99)
Potassium: 4.5 mmol/L (ref 3.5–5.1)
Sodium: 135 mmol/L (ref 135–145)

## 2024-04-28 LAB — CBC WITH DIFFERENTIAL/PLATELET
Abs Immature Granulocytes: 0.12 K/uL — ABNORMAL HIGH (ref 0.00–0.07)
Basophils Absolute: 0 K/uL (ref 0.0–0.1)
Basophils Relative: 0 %
Eosinophils Absolute: 0 K/uL (ref 0.0–0.5)
Eosinophils Relative: 0 %
HCT: 31.2 % — ABNORMAL LOW (ref 36.0–46.0)
Hemoglobin: 10.4 g/dL — ABNORMAL LOW (ref 12.0–15.0)
Immature Granulocytes: 1 %
Lymphocytes Relative: 13 %
Lymphs Abs: 2.1 K/uL (ref 0.7–4.0)
MCH: 32.3 pg (ref 26.0–34.0)
MCHC: 33.3 g/dL (ref 30.0–36.0)
MCV: 96.9 fL (ref 80.0–100.0)
Monocytes Absolute: 2.5 K/uL — ABNORMAL HIGH (ref 0.1–1.0)
Monocytes Relative: 16 %
Neutro Abs: 11.4 K/uL — ABNORMAL HIGH (ref 1.7–7.7)
Neutrophils Relative %: 70 %
Platelets: 388 K/uL (ref 150–400)
RBC: 3.22 MIL/uL — ABNORMAL LOW (ref 3.87–5.11)
RDW: 12.7 % (ref 11.5–15.5)
WBC: 16.1 K/uL — ABNORMAL HIGH (ref 4.0–10.5)
nRBC: 0 % (ref 0.0–0.2)

## 2024-04-28 LAB — HIV ANTIBODY (ROUTINE TESTING W REFLEX): HIV Screen 4th Generation wRfx: NONREACTIVE

## 2024-04-28 LAB — MAGNESIUM: Magnesium: 1.8 mg/dL (ref 1.7–2.4)

## 2024-04-28 LAB — PHOSPHORUS: Phosphorus: 3.2 mg/dL (ref 2.5–4.6)

## 2024-04-28 MED ORDER — MAGNESIUM GLYCINATE 120 MG PO CAPS: 1.0000 | ORAL_CAPSULE | Freq: Every day | ORAL | Status: DC

## 2024-04-28 MED ORDER — POVIDONE-IODINE 10 % EX SWAB: 2.0000 | Freq: Once | CUTANEOUS | Status: DC

## 2024-04-28 MED ORDER — OYSTER SHELL CALCIUM/D3 500-5 MG-MCG PO TABS
1.0000 | ORAL_TABLET | Freq: Every day | ORAL | Status: DC
  Administered 2024-04-30 – 2024-05-05 (×6): 1 via ORAL
  Filled 2024-04-28 (×9): qty 1

## 2024-04-28 MED ORDER — HYDROMORPHONE HCL 1 MG/ML IJ SOLN
1.0000 mg | INTRAMUSCULAR | Status: AC
  Administered 2024-04-28: 1 mg via INTRAVENOUS
  Filled 2024-04-28: qty 1

## 2024-04-28 MED ORDER — HYDROXYZINE HCL 25 MG PO TABS
25.0000 mg | ORAL_TABLET | Freq: Four times a day (QID) | ORAL | Status: DC | PRN
  Administered 2024-04-30 – 2024-05-03 (×4): 25 mg via ORAL
  Filled 2024-04-28 (×4): qty 1

## 2024-04-28 MED ORDER — TRANEXAMIC ACID-NACL 1000-0.7 MG/100ML-% IV SOLN
1000.0000 mg | INTRAVENOUS | Status: AC
  Administered 2024-04-29: 1000 mg via INTRAVENOUS
  Filled 2024-04-28: qty 100

## 2024-04-28 MED ORDER — ACETAMINOPHEN 325 MG PO TABS
650.0000 mg | ORAL_TABLET | Freq: Four times a day (QID) | ORAL | Status: DC
  Administered 2024-04-28 – 2024-05-05 (×30): 650 mg via ORAL
  Filled 2024-04-28 (×30): qty 2

## 2024-04-28 MED ORDER — OXYCODONE HCL 5 MG PO TABS
5.0000 mg | ORAL_TABLET | ORAL | Status: DC | PRN
  Administered 2024-04-28: 10 mg via ORAL
  Filled 2024-04-28: qty 2

## 2024-04-28 MED ORDER — METHOCARBAMOL 1000 MG/10ML IJ SOLN
500.0000 mg | Freq: Three times a day (TID) | INTRAMUSCULAR | Status: DC
  Administered 2024-04-28 – 2024-04-29 (×2): 500 mg via INTRAVENOUS
  Filled 2024-04-28 (×2): qty 10

## 2024-04-28 MED ORDER — CEFAZOLIN SODIUM-DEXTROSE 2-4 GM/100ML-% IV SOLN
2.0000 g | INTRAVENOUS | Status: AC
  Administered 2024-04-29: 2 g via INTRAVENOUS
  Filled 2024-04-28: qty 100

## 2024-04-28 MED ORDER — DIPHENHYDRAMINE HCL 25 MG PO CAPS
25.0000 mg | ORAL_CAPSULE | ORAL | Status: AC
  Administered 2024-04-28: 25 mg via ORAL
  Filled 2024-04-28: qty 1

## 2024-04-28 MED ORDER — MORPHINE SULFATE (PF) 2 MG/ML IV SOLN
1.0000 mg | INTRAVENOUS | Status: DC | PRN
  Administered 2024-04-28: 1 mg via INTRAVENOUS
  Filled 2024-04-28: qty 1

## 2024-04-28 MED ORDER — CHLORHEXIDINE GLUCONATE 4 % EX SOLN
60.0000 mL | Freq: Once | CUTANEOUS | Status: AC
  Administered 2024-04-29: 4 via TOPICAL
  Filled 2024-04-28: qty 60

## 2024-04-28 MED ORDER — KETOROLAC TROMETHAMINE 15 MG/ML IJ SOLN
15.0000 mg | Freq: Three times a day (TID) | INTRAMUSCULAR | Status: AC
  Administered 2024-04-28 – 2024-05-03 (×14): 15 mg via INTRAVENOUS
  Filled 2024-04-28 (×14): qty 1

## 2024-04-28 MED ORDER — SIMVASTATIN 20 MG PO TABS
20.0000 mg | ORAL_TABLET | Freq: Every day | ORAL | Status: DC
  Administered 2024-04-28 – 2024-05-05 (×8): 20 mg via ORAL
  Filled 2024-04-28 (×8): qty 1

## 2024-04-28 MED ORDER — NICOTINE 14 MG/24HR TD PT24
14.0000 mg | MEDICATED_PATCH | Freq: Every day | TRANSDERMAL | Status: DC
Start: 2024-04-28 — End: 2024-04-28
  Administered 2024-04-28: 14 mg via TRANSDERMAL
  Filled 2024-04-28: qty 1

## 2024-04-28 MED ORDER — METHOCARBAMOL 750 MG PO TABS: 750.0000 mg | ORAL_TABLET | Freq: Three times a day (TID) | ORAL | Status: DC | PRN

## 2024-04-28 MED ORDER — DULOXETINE HCL 60 MG PO CPEP
60.0000 mg | ORAL_CAPSULE | Freq: Every day | ORAL | Status: DC
  Administered 2024-04-28 – 2024-05-05 (×8): 60 mg via ORAL
  Filled 2024-04-28 (×2): qty 1
  Filled 2024-04-28: qty 2
  Filled 2024-04-28 (×5): qty 1

## 2024-04-28 NOTE — Progress Notes (Signed)
 PROGRESS NOTE   [name]  FMW:[id number] DOB: 01-04-1966 DOA: 04/27/2024 PCP: Lavell Bari [name], FNP   Chief Complaint  Patient presents with   Fall   Level of care: Med-Surg  Brief Admission History:  58 y.o. female with medical history significant for hyperlipidemia, chronic anxiety, osteoporosis, current tobacco smoker, who presents to the ER via EMS from home after a mechanical fall.  The patient was on a stepstool, taking down her halloween decorations when she lost her balance and fell.  No head injury or loss of consciousness.  States she was doing well prior to this fall.  No dizziness, chest pain, or palpitations.  No GI or urinary symptoms.  She had 2 falls in the past which resulted in fractures.  They were also mechanical falls.  First 1 was at least 10 years ago, broke her left knee and the second one was nearly 2 years ago, after tripping on her grandson toy, broke her left hip.   In the ER, she was noted to have deformity and laceration to her left middle finger with fracture.  Imaging revealed comminuted proximal tibia and fibula fracture.  No signs of compartment syndrome.  EDP discussed the case with orthopedic surgery, Dr. Onesimo, who recommended admission and transfer to [facility] for possible orthopedic surgical intervention.    Assessment and Plan:   Closed fracture of left tibia plateau Secondary to mechanical fall  Additionally, imaging revealed comminuted proximal tibia and fibula fracture.   No signs of compartment syndrome.  Continue pain control as needed and bowel regimen as needed. Orthopedic surgery consulted -- will see when arrives at [facility], unsure when she will get a bed at [facility] now waiting>15 hours Care order placed to notify orthopedics when she arrives at [facility] Communication from Dr. Edna: Fracture will need surgery. Our trauma team, [facility] and [facility] are aware, we will see her when transferred possibly surgery tomorrow depending when she gets  here    Open left middle finger fracture, POA Irrigated and washed out in the ER Continue IV cefazolin  for her open left middle finger fracture, until seen by orthopedic surgery. Follow peripheral blood cultures x 2   Leukocytosis, likely secondary to fracture of left tibia/fibula, laceration of left third finger, POA Presented with WBC 17, neutrophilia 14, and bands Follow-up peripheral blood culture x 2 Continue IV antibiotics for now, until seen by orthopedic surgery Monitor for fever and WBCs.   Generalized anxiety disorder Resumed home regimen.   Hyperlipidemia Resumed home regimen.   Osteoporosis Takes alendronate  every Tuesday, prior to admission. Continue fall precautions.   Tobacco use disorder Recommend complete cessation of tobacco use. Nicotine  patch daily to prevent withdrawal.    DVT prophylaxis: SCDs Code Status: Full  Family Communication:  Disposition: awaiting for bed at [facility] for ortho consultation   Consultants:  Orthopedics [facility] Procedures:  Pending  Antimicrobials:    Subjective: Pt reports that pain is better controlled now, wondering when she will get a bed at [facility].    Objective: Vitals:   04/28/24 0700 04/28/24 0753 04/28/24 0800 04/28/24 1100  BP: 132/88  98/88 131/83  Pulse: 99 92 92 96  Resp: 16 18 16 16   Temp:  98 F (36.7 C)    TempSrc:  Oral    SpO2: 90% 92% 91% 94%    Intake/Output Summary (Last 24 hours) at 04/28/2024 1210 Last data filed at 04/28/2024 9161 Gross per 24 hour  Intake 192.6 ml  Output --  Net 192.6 ml   There were no vitals filed for this visit. Examination:  General exam: Appears calm and comfortable.   Respiratory system: Clear to auscultation. Respiratory effort normal. Cardiovascular system: normal S1 & S2 heard. No JVD, murmurs, rubs, gallops or clicks. No pedal edema. Gastrointestinal system: Abdomen is nondistended, soft and nontender. No organomegaly or masses felt. Normal bowel sounds  heard. Central nervous system: Alert and oriented. No focal neurological deficits. Extremities: palpable pulses bilateral lower extremities.  Left knee in immobilizer.  Skin: No rashes, lesions or ulcers. Psychiatry: Judgement and insight appear normal. Mood & affect appropriate.   Data Reviewed: I have personally reviewed following labs and imaging studies  CBC: Recent Labs  Lab 04/27/24 1833 04/28/24 0438  WBC 17.5* 16.1*  NEUTROABS 14.7* 11.4*  HGB 11.8* 10.4*  HCT 35.4* 31.2*  MCV 97.3 96.9  PLT 392 388    Basic Metabolic Panel: Recent Labs  Lab 04/27/24 1833 04/28/24 0438  NA 136 135  K 4.1 4.5  CL 100 99  CO2 27 27  GLUCOSE 132* 127*  BUN 14 14  CREATININE 0.70 0.67  CALCIUM  8.9 8.6*  MG  --  1.8  PHOS  --  3.2    CBG: No results for input(s): GLUCAP in the last 168 hours.  Recent Results (from the past 240 hours)  Culture, blood (Routine X 2) w Reflex to ID Panel     Status: None (Preliminary result)   Collection Time: 04/27/24 10:17 PM   Specimen: BLOOD RIGHT FOREARM  Result Value Ref Range Status   Specimen Description BLOOD RIGHT FOREARM  Final   Special Requests   Final    BOTTLES DRAWN AEROBIC AND ANAEROBIC Blood Culture adequate volume   Culture   Final    NO GROWTH < 12 HOURS Performed at [facility], [address] [place], [place] [address]    Report Status PENDING  Incomplete  Culture, blood (Routine X 2) w Reflex to ID Panel     Status: None (Preliminary result)   Collection Time: 04/27/24 10:17 PM   Specimen: BLOOD  Result Value Ref Range Status   Specimen Description BLOOD BLOOD RIGHT HAND  Final   Special Requests   Final    BOTTLES DRAWN AEROBIC AND ANAEROBIC Blood Culture adequate volume   Culture   Final    NO GROWTH < 12 HOURS Performed at [facility], [address], [place], [place] [address]    Report Status PENDING  Incomplete     Radiology Studies: CT Knee Left Wo Contrast Result Date: 04/27/2024 CLINICAL DATA:   Trauma to the left knee. EXAM: CT OF THE LEFT KNEE WITHOUT CONTRAST TECHNIQUE: Multidetector CT imaging of the left knee was performed according to the standard protocol. Multiplanar CT image reconstructions were also generated. RADIATION DOSE REDUCTION: This exam was performed according to the departmental dose-optimization program which includes automated exposure control, adjustment of the mA and/or kV according to patient size and/or use of iterative reconstruction technique. COMPARISON:  Radiograph dated 04/27/2024. FINDINGS: Bones/Joint/Cartilage Comminuted and intra-articular fracture of the proximal tibia involving the tibial spine, medial and lateral tibial plateau and extending into the proximal tibial diaphysis. There is depression of the lateral tibial plateau. There is comminuted and displaced fracture of the fibular head and neck. Prior fixation of the patella. There is severe osteopenia. No dislocation. Large suprapatellar lipohemarthrosis. Ligaments Suboptimally assessed by CT. Muscles and Tendons There is edema of the musculature of the calf.  No fluid  collection. Soft tissues Subcutaneous stranding of the anterior and medial calf. IMPRESSION: 1. Comminuted intra-articular fracture of the proximal tibia as well as displaced comminuted fracture of the fibular head and neck. No dislocation. 2. Large suprapatellar lipohemarthrosis. Electronically Signed   By: Vanetta Chou M.D.   On: 04/27/2024 19:49   DG Hand Complete Left Result Date: 04/27/2024 EXAM: 3 OR MORE VIEW(S) XRAY OF THE LEFT HAND 04/27/2024 07:20:30 PM COMPARISON: 04/27/2024 CLINICAL HISTORY: post reduction FINDINGS: BONES AND JOINTS: Acute intra-articular fracture of the distal aspect of the third digit middle phalanx. Improved alignment of the third DIP joint status post reduction. Suspicion of small intraarticular fracture at the base of the third distal phalanx. Chondrocalcinosis of the TFCC. SOFT TISSUES: Soft tissue edema.  IMPRESSION: 1. Acute intra-articular fracture of the distal aspect of the third digit middle phalanx and suspected small intraarticular fracture at the base of the third distal phalanx. Reduction of third digit dip dislocation now with normal alignment . 2. Normal alignment fifth digit. Suspected fracture of the distal aspect of the fifth proximal phalanx is not well seen post splinting. Electronically signed by: Luke Bun MD 04/27/2024 07:28 PM EST RP Workstation: [id number]   DG Chest Portable 1 View Result Date: 04/27/2024 EXAM: 1 VIEW(S) XRAY OF THE CHEST 04/27/2024 06:29:00 PM COMPARISON: 04/06/2023 CLINICAL HISTORY: pre op FINDINGS: LUNGS AND PLEURA: No focal pulmonary opacity. No pulmonary edema. No pleural effusion. No pneumothorax. HEART AND MEDIASTINUM: Aortic arch calcifications. BONES AND SOFT TISSUES: Chronic midthoracic vertebral body compression fracture. IMPRESSION: 1. No acute cardiopulmonary abnormality. Electronically signed by: Luke Bun MD 04/27/2024 06:49 PM EST RP Workstation: [id number]   DG Hand Complete Left Result Date: 04/27/2024 EXAM: 3 OR MORE VIEW(S) XRAY OF THE LEFT HAND 04/27/2024 05:35:00 PM COMPARISON: None available. CLINICAL HISTORY: fall, deformity FINDINGS: BONES AND JOINTS: Acute displaced intra-articular fracture of the distal aspect of the third digit middle phalanx. Acute displaced fracture of the head of the fifth digit proximal phalanx. Dislocation at the third DIP joint with the distal phalanx displaced to the radial side of the middle phalanx and directed laterally. Cardiogenic calcification at the wrist. Flexion deformity of the fifth digit proximal interphalangeal joint. Scattered degenerative changes. SOFT TISSUES: Soft tissue swelling and laceration. IMPRESSION: 1. Acute displaced intra-articular fracture of the distal/head of third middle phalanx with associated distal interphalangeal joint dislocation and lateral/radial displacement of the distal  phalanx with respect to the middle phalanx . 2. Acute displaced fracture of the head of the fifth proximal phalanx. 3. Soft tissue swelling and laceration. Electronically signed by: Luke Bun MD 04/27/2024 06:20 PM EST RP Workstation: [id number]   DG Knee Complete 4 Views Left Result Date: 04/27/2024 EXAM: 4 VIEW(S) XRAY OF THE LEFT KNEE 04/27/2024 05:35:00 PM COMPARISON: 07/16/2022 CLINICAL HISTORY: fall, deformity FINDINGS: BONES AND JOINTS: Patellar cerclage wire in place. Comminuted proximal tibial fracture with intra-articular extension. Displacement and slight depression of the posterior lateral tibial plateau. Comminuted fibular head and neck fracture with displacement and mild angulation. Lipohemarthrosis. Linear artifact projects over the distal shaft of the femur and the proximal lower leg. SOFT TISSUES: Surrounding soft tissue swelling. IMPRESSION: 1. Comminuted proximal tibial fracture with intra-articular extension, with displacement and slight depression of the posterior, lateral tibial plateau. 2. Comminuted fibular head and neck fracture with displacement and mild angulation. 3. Lipohemarthrosis. Electronically signed by: Luke Bun MD 04/27/2024 06:13 PM EST RP Workstation: [id number]   DG Tibia/Fibula Left Result Date: 04/27/2024 EXAM: _VIEWS_ VIEW(S) XRAY OF THE LEFT  TIBIA AND FIBULA 04/27/2024 05:35:00 PM COMPARISON: None available. CLINICAL HISTORY: fall, deformity FINDINGS: BONES AND JOINTS: Patellar cerclage wires in place. Comminuted proximal tibial fracture with possible intra-articular extension. Displaced lateral tibial plateau. Comminuted fibular head and neck fracture. Joint effusion with lipohemarthrosis. SOFT TISSUES: The soft tissues are unremarkable. IMPRESSION: 1. Highly comminuted intraarticular proximal tibial fracture with lipohemarthrosis. 2. Comminuted fibular head and neck fractures. Electronically signed by: Luke Bun MD 04/27/2024 06:10 PM EST RP Workstation:  [id number]    Scheduled Meds:  calcium -vitamin D   1 tablet Oral Q breakfast   DULoxetine   60 mg Oral Daily   nicotine   14 mg Transdermal Daily   simvastatin   20 mg Oral q1800   Continuous Infusions:   ceFAZolin  (ANCEF ) IV Stopped (04/28/24 9161)   lactated ringers  75 mL/hr at 04/28/24 0838     LOS: 1 day   Time spent: 55 mins  Zitlaly Malson Vicci, MD How to contact the [facility] Attending or Consulting provider 7A - 7P or covering provider during after hours 7P -7A, for this patient?  Check the care team in [facility] and look for a) attending/consulting [facility] provider listed and b) the [facility] team listed Log into [email or web address] to find provider on call.  Locate the [facility] provider you are looking for under [facility] and page to a number that you can be directly reached. If you still have difficulty reaching the provider, please page the [facility] (Director on Call) for the Hospitalists listed on amion for assistance.  04/28/2024, 12:10 PM

## 2024-04-28 NOTE — Progress Notes (Addendum)
 Orthopaedic Trauma Service   Patient has just arrived from [facility] to [facility], 5 N. room 5  Full orthopedic trauma consult to follow tomorrow  Patient is a 58 year old female who fell off of approximately 3 foot stepladder while taking down Halloween decorations and also hit some concrete cinderblocks.  She had immediate onset of pain in her left knee and inability to bear weight.  She also sustained injury to her left hand.  She was brought to [facility] where she was found to have a complex bicondylar left tibial plateau fracture.  The orthopedist on-call at [facility] requested transfer to [facility].  Patient has now arrived.  Patient seen with the nurse at bedside.  Overall she is comfortable currently rating her pain as 6/10 but she appears very comfortable He denies any numbness or tingling in her left lower extremity.  No real changes in her pain character over the last several hours.  Her pain is fairly stable  Patient lives with her daughter and states that her daughter does not work and can take care of her  She does have a diagnosis of osteoporosis and uses nicotine   BP (!) 130/96 (BP Location: Left Arm)   Pulse (!) 101   Temp 98.4 F (36.9 C) (Oral)   Resp 17   Ht 5' 5 (1.651 m)   Wt 57.6 kg   SpO2 94%   BMI 21.13 kg/m   Gen: Very pleasant 58 year old female resting comfortably in bed, no acute distress, appropriate Lungs: unlabored Cardiac: Regular  Left lower extremity  Patient is only in a knee immobilizer.  No compressive wrap present  Patient has significant swelling to her left lower leg along with numerous fracture blisters with the majority of them along the medial aspect of her proximal lower leg  Ecchymosis is also present proximal lower leg  Compartments are full but compressible unfortunately no pain out of proportion with passive stretching of her toes or her ankle  DPN, SPN and TN sensory functions are intact  and symmetric to the contralateral side  EHL, FHL and lesser toe motor functions are intact.  Ankle flexion, extension, inversion and eversion are intact  No ankle or foot tenderness are appreciated  No other traumatic wounds are noted  Extremity is warm  + DP pulse  Left upper extremity  Numerous bandages to her 2nd through 5th digits  I did not remove these  Left hand will be evaluated by the hand surgical team  I did remove her wrist splint as she does not have evidence of a distal radius fracture and is not tender here  A/P  58 year old female fall off a stepladder with complex left bicondylar tibial plateau fracture, possible open left middle finger distal phalanx fracture  - Fall  - Comminuted left bicondylar tibial plateau fracture, closed  Unfortunately the degree of her swelling as well as presence of fracture blisters will preclude early definitive ORIF.  She will require fixation both medially and laterally and therefore we need a good soft tissue envelope to allow for safe surgical intervention.  As such we will plan for placement of an external fixator tomorrow afternoon.  Definitive fixation likely to take place in 10 to 14 days but given her history of nicotine  use it may be longer   Will have the Ortho techs place her in a bulky compressive dressing from foot to thigh along with her knee immobilizer.  I have also instructed the nurse  to get ice on the leg as well.  In addition patient should elevate her leg above her heart   She is nonweightbearing for now and will be nonweightbearing for roughly 8 weeks after definitive surgery   Continue to monitor for signs and symptoms of compartment syndrome but fortunately she does not exhibit evidence of evolving compartment syndrome.  Will place order for continued compartment checks  -Left hand fracture, open  Hand consult  Continue IV abx per open fracture protocol   - FEN  NPO after MN   - Nicotine   dependence  Discontinue nicotine  patch  Please do not order nicotine  patch again during this admission  Increases risk of infection and nonunion  - Dispo Continue inpatient care OR tomorrow for Ex Fix L tibial plateau  Therapy evals post op May need SNF at [facility], PA-C [phone] (C) 04/28/2024, 6:12 PM  [facility] [address] [place] [address] [phone] MAXIMINO MILLING (F)      Patient ID: [name], female   DOB: 03/05/1966, 58 y.o.   MRN: [id number]

## 2024-04-28 NOTE — Progress Notes (Signed)
 PROGRESS NOTE    ELLISSA AYO  FMW:[id number] DOB: 14-Oct-1965 DOA: 04/27/2024 PCP: Lavell Bari [name], FNP   Brief Narrative: MELVIN MARMO is a 58 y.o. female with a history of hyperlipidemia, anxiety, osteoporosis, tobacco use.  Patient presented secondary to a fall after losing her balance on a stool, suffering left tibia plateau and fibular head/neck fractures, in addition to an open left third digit finger. Empiric antibiotics started and orthopedic surgery consulted for management.   Assessment/Plan:  Closed comminuted fracture of left tibia plateau Closed comminuted fracture of the left fibular head/neck Secondary to trauma from fall and noted on x-ray/CT imaging. Complicated by underlying osteoporosis. Orthopedic surgery consulted with plan for surgical management. -Orthopedic surgery recommendations: plan for surgical management on 11/7  Fall Secondary to loss of balance and fall off a stool. -PT/OT recommendations  Open left third digit fracture Secondary to fall. Cefazolin  started for antibiotic coverage. Orthopedic surgery consulted and recommendations are pending. -Follow-up orthopedic surgery recommendations  Leukocytosis Likely reactive and secondary to trauma and fracture. No fevers or symptoms for infection. Patient does have an open fracture, however. Antibiotics started.  Generalized anxiety disorder -Continue Cymbalta   Hyperlipidemia -Continue simvastatin   Osteoporosis -Continue calcium  and vitamin D   Tobacco use Cessation recommended. Nicotine  patch ordered. -Continue nicotine  patch   DVT prophylaxis: SCDs Code Status:   Code Status: Full Code Family Communication: None at bedside Disposition Plan: Discharge home vs SNF pending ongoing orthopedic surgery recommendations/management and PT/OT recommendations   Consultants:  Orthopedic surgery  Procedures:  None  Antimicrobials: None    Subjective: Patient reports adequate pain control  overnight.  Objective: BP 129/83 (BP Location: Right Arm)   Pulse (!) 105   Temp 98.1 F (36.7 C)   Resp 18   Ht 5' 5 (1.651 m)   Wt 57.6 kg   SpO2 97%   BMI 21.13 kg/m   Examination:  General exam: Appears calm and comfortable. Respiratory system: Clear to auscultation. Respiratory effort normal. Cardiovascular system: S1 & S2 heard, fast rate with normal rhythm. No murmur. Gastrointestinal system: Abdomen is nondistended, soft and nontender. Normal bowel sounds heard. Central nervous system: Alert and oriented. No focal neurological deficits. Musculoskeletal: Left leg in ACE wrap dressing and leg immobilizer. Left second, third and fourth digits wrapped in gauze. Skin: No cyanosis. No rashes Psychiatry: Judgement and insight appear normal. Mood & affect appropriate.    Data Reviewed: I have personally reviewed following labs and imaging studies   Last CBC Lab Results  Component Value Date   WBC 12.8 (H) 04/29/2024   HGB 8.7 (L) 04/29/2024   HCT 26.1 (L) 04/29/2024   MCV 95.6 04/29/2024   MCH 31.9 04/29/2024   RDW 12.9 04/29/2024   PLT 299 04/29/2024     Last metabolic panel Lab Results  Component Value Date   GLUCOSE 117 (H) 04/29/2024   NA 131 (L) 04/29/2024   K 3.9 04/29/2024   CL 97 (L) 04/29/2024   CO2 25 04/29/2024   BUN 10 04/29/2024   CREATININE 0.76 04/29/2024   GFRNONAA >60 04/29/2024   CALCIUM  8.0 (L) 04/29/2024   PHOS 3.2 04/28/2024   PROT 6.3 04/07/2024   ALBUMIN 4.1 04/07/2024   LABGLOB 2.2 04/07/2024   AGRATIO 1.6 10/09/2022   BILITOT <0.2 04/07/2024   ALKPHOS 62 04/07/2024   AST 21 04/07/2024   ALT 14 04/07/2024   ANIONGAP 9 04/29/2024     Creatinine Clearance: Estimated Creatinine Clearance: 69 mL/min (by C-G formula based  on SCr of 0.76 mg/dL).  Recent Results (from the past 240 hours)  Culture, blood (Routine X 2) w Reflex to ID Panel     Status: None (Preliminary result)   Collection Time: 04/27/24 10:17 PM   Specimen: BLOOD  RIGHT FOREARM  Result Value Ref Range Status   Specimen Description BLOOD RIGHT FOREARM  Final   Special Requests   Final    BOTTLES DRAWN AEROBIC AND ANAEROBIC Blood Culture adequate volume   Culture   Final    NO GROWTH 2 DAYS Performed at [facility], [address], [place], [place] [address]    Report Status PENDING  Incomplete  Culture, blood (Routine X 2) w Reflex to ID Panel     Status: None (Preliminary result)   Collection Time: 04/27/24 10:17 PM   Specimen: BLOOD  Result Value Ref Range Status   Specimen Description BLOOD BLOOD RIGHT HAND  Final   Special Requests   Final    BOTTLES DRAWN AEROBIC AND ANAEROBIC Blood Culture adequate volume   Culture   Final    NO GROWTH 2 DAYS Performed at [facility], [address] [place], [place] [address]    Report Status PENDING  Incomplete  MRSA Next Gen by PCR, Nasal     Status: Abnormal   Collection Time: 04/28/24  5:52 PM   Specimen: Nasal Mucosa; Nasal Swab  Result Value Ref Range Status   MRSA by PCR Next Gen DETECTED (A) NOT DETECTED Final    Comment: CRITICAL RESULTS CALLED TO, READ BACK BY AND VERIFIED WITH : RN M.SHIESTHA ON 04/29/24 AT 0042 BY NM (NOTE) The GeneXpert MRSA Assay (FDA approved for NASAL specimens only), is one component of a comprehensive MRSA colonization surveillance program. It is not intended to diagnose MRSA infection nor to guide or monitor treatment for MRSA infections. Test performance is not FDA approved in patients less than 67 years old. Performed at [facility], [address] [place], [place] [address]       Radiology Studies: CT Knee Left Wo Contrast Result Date: 04/27/2024 CLINICAL DATA:  Trauma to the left knee. EXAM: CT OF THE LEFT KNEE WITHOUT CONTRAST TECHNIQUE: Multidetector CT imaging of the left knee was performed according to the standard protocol. Multiplanar CT image reconstructions were also generated. RADIATION DOSE REDUCTION: This exam was performed according to  the departmental dose-optimization program which includes automated exposure control, adjustment of the mA and/or kV according to patient size and/or use of iterative reconstruction technique. COMPARISON:  Radiograph dated 04/27/2024. FINDINGS: Bones/Joint/Cartilage Comminuted and intra-articular fracture of the proximal tibia involving the tibial spine, medial and lateral tibial plateau and extending into the proximal tibial diaphysis. There is depression of the lateral tibial plateau. There is comminuted and displaced fracture of the fibular head and neck. Prior fixation of the patella. There is severe osteopenia. No dislocation. Large suprapatellar lipohemarthrosis. Ligaments Suboptimally assessed by CT. Muscles and Tendons There is edema of the musculature of the calf.  No fluid collection. Soft tissues Subcutaneous stranding of the anterior and medial calf. IMPRESSION: 1. Comminuted intra-articular fracture of the proximal tibia as well as displaced comminuted fracture of the fibular head and neck. No dislocation. 2. Large suprapatellar lipohemarthrosis. Electronically Signed   By: Vanetta Chou M.D.   On: 04/27/2024 19:49   DG Hand Complete Left Result Date: 04/27/2024 EXAM: 3 OR MORE VIEW(S) XRAY OF THE LEFT HAND 04/27/2024 07:20:30 PM COMPARISON: 04/27/2024 CLINICAL HISTORY: post reduction FINDINGS: BONES AND JOINTS:  Acute intra-articular fracture of the distal aspect of the third digit middle phalanx. Improved alignment of the third DIP joint status post reduction. Suspicion of small intraarticular fracture at the base of the third distal phalanx. Chondrocalcinosis of the TFCC. SOFT TISSUES: Soft tissue edema. IMPRESSION: 1. Acute intra-articular fracture of the distal aspect of the third digit middle phalanx and suspected small intraarticular fracture at the base of the third distal phalanx. Reduction of third digit dip dislocation now with normal alignment . 2. Normal alignment fifth digit.  Suspected fracture of the distal aspect of the fifth proximal phalanx is not well seen post splinting. Electronically signed by: Luke Bun MD 04/27/2024 07:28 PM EST RP Workstation: [id number]   DG Chest Portable 1 View Result Date: 04/27/2024 EXAM: 1 VIEW(S) XRAY OF THE CHEST 04/27/2024 06:29:00 PM COMPARISON: 04/06/2023 CLINICAL HISTORY: pre op FINDINGS: LUNGS AND PLEURA: No focal pulmonary opacity. No pulmonary edema. No pleural effusion. No pneumothorax. HEART AND MEDIASTINUM: Aortic arch calcifications. BONES AND SOFT TISSUES: Chronic midthoracic vertebral body compression fracture. IMPRESSION: 1. No acute cardiopulmonary abnormality. Electronically signed by: Luke Bun MD 04/27/2024 06:49 PM EST RP Workstation: [id number]   DG Hand Complete Left Result Date: 04/27/2024 EXAM: 3 OR MORE VIEW(S) XRAY OF THE LEFT HAND 04/27/2024 05:35:00 PM COMPARISON: None available. CLINICAL HISTORY: fall, deformity FINDINGS: BONES AND JOINTS: Acute displaced intra-articular fracture of the distal aspect of the third digit middle phalanx. Acute displaced fracture of the head of the fifth digit proximal phalanx. Dislocation at the third DIP joint with the distal phalanx displaced to the radial side of the middle phalanx and directed laterally. Cardiogenic calcification at the wrist. Flexion deformity of the fifth digit proximal interphalangeal joint. Scattered degenerative changes. SOFT TISSUES: Soft tissue swelling and laceration. IMPRESSION: 1. Acute displaced intra-articular fracture of the distal/head of third middle phalanx with associated distal interphalangeal joint dislocation and lateral/radial displacement of the distal phalanx with respect to the middle phalanx . 2. Acute displaced fracture of the head of the fifth proximal phalanx. 3. Soft tissue swelling and laceration. Electronically signed by: Luke Bun MD 04/27/2024 06:20 PM EST RP Workstation: [id number]   DG Knee Complete 4 Views Left Result  Date: 04/27/2024 EXAM: 4 VIEW(S) XRAY OF THE LEFT KNEE 04/27/2024 05:35:00 PM COMPARISON: 07/16/2022 CLINICAL HISTORY: fall, deformity FINDINGS: BONES AND JOINTS: Patellar cerclage wire in place. Comminuted proximal tibial fracture with intra-articular extension. Displacement and slight depression of the posterior lateral tibial plateau. Comminuted fibular head and neck fracture with displacement and mild angulation. Lipohemarthrosis. Linear artifact projects over the distal shaft of the femur and the proximal lower leg. SOFT TISSUES: Surrounding soft tissue swelling. IMPRESSION: 1. Comminuted proximal tibial fracture with intra-articular extension, with displacement and slight depression of the posterior, lateral tibial plateau. 2. Comminuted fibular head and neck fracture with displacement and mild angulation. 3. Lipohemarthrosis. Electronically signed by: Luke Bun MD 04/27/2024 06:13 PM EST RP Workstation: [id number]   DG Tibia/Fibula Left Result Date: 04/27/2024 EXAM: _VIEWS_ VIEW(S) XRAY OF THE LEFT TIBIA AND FIBULA 04/27/2024 05:35:00 PM COMPARISON: None available. CLINICAL HISTORY: fall, deformity FINDINGS: BONES AND JOINTS: Patellar cerclage wires in place. Comminuted proximal tibial fracture with possible intra-articular extension. Displaced lateral tibial plateau. Comminuted fibular head and neck fracture. Joint effusion with lipohemarthrosis. SOFT TISSUES: The soft tissues are unremarkable. IMPRESSION: 1. Highly comminuted intraarticular proximal tibial fracture with lipohemarthrosis. 2. Comminuted fibular head and neck fractures. Electronically signed by: Luke Bun MD 04/27/2024 06:10 PM EST RP Workstation: [id number]  LOS: 2 days    Elgin Lam, MD [facility] 04/29/2024, 7:30 AM   If 7PM-7AM, please contact night-coverage [email or web address]

## 2024-04-28 NOTE — ED Notes (Signed)
Family updated on pt status

## 2024-04-28 NOTE — Progress Notes (Signed)
 PHARMACIST - PHYSICIAN ORDER COMMUNICATION  CONCERNING: P&T Medication Policy on Herbal Medications  DESCRIPTION:  This patient's order for:  Magnesium  Glycinate  has been noted.  This product(s) is classified as an "herbal" or natural product. Due to a lack of definitive safety studies or FDA approval, nonstandard manufacturing practices, plus the potential risk of unknown drug-drug interactions while on inpatient medications, the Pharmacy and Therapeutics Committee does not permit the use of "herbal" or natural products of this type within [facility].   ACTION TAKEN: The pharmacy department is unable to verify this order at this time and your patient has been informed of this safety policy. Please reevaluate patient's clinical condition at discharge and address if the herbal or natural product(s) should be resumed at that time.  Khia Dieterich, PharmD

## 2024-04-28 NOTE — Hospital Course (Addendum)
 The patient is 58 y.o. female with medical history significant for hyperlipidemia, chronic anxiety, osteoporosis, current tobacco smoker, who presents to the ER via EMS from home after a mechanical fall. The patient was on a stepstool, taking down her halloween decorations when she lost her balance and fell.    In the ER, she was noted to have deformity and laceration to her left middle finger with fracture.  Imaging revealed comminuted proximal tibia and fibula fracture.  No signs of compartment syndrome.  EDP discussed the case with orthopedic surgery, Dr. Onesimo, who recommended admission and transfer to [facility] for possible orthopedic surgical intervention.   Currently she is undergone a knee external fixator and underwent external fixation 11/7.  On 11/10 she is going for ORIF.  Hemoglobin has trended down and she was typed and screened and transfused 1 unit PRBCs on 05/03/2024.  Patient would like to pursue rehab facility at discharge.  She is improved and stabilized from a medical standpoint.  Assessment and Plan:  Closed Comminuted fracture of left tibia plateau and Closed comminuted fracture of the Left Fibular Head/Neck: Secondary to trauma from fall and noted on x-ray/CT imaging. Complicated by underlying osteoporosis. Orthopedic surgery consulted and performed external fixation of the left leg/tibia and closed reduction of the left tibial plateau fracture on 11/7;  Now she is being taken to the OR for ORIF on 05/02/24 of the Left Bicondylar Tibial Plateau Fx and had her Left Spanning Knee External Fixation removed. -Recommendation for nonweightbearing and Lovenox  per orthopedic surgery and aggressive ice and elevation for swelling control.  Per orthopedic surgery if the surgery will be more than 10 days out that she may benefit from getting footplate from OT to help maintain neutral ankle position -Continue pain control with p.o. acetaminophen  650 mg Q6 scheduled, IV ketorolac  Q8  for 5 days, p.o. oxycodone  5 to 10 mg p.o. every 4 as needed for moderate pain and severe pain and IV morphine  1 to 2 mg Q2 as needed for breakthrough pain -Bowel regimen with bisacodyl 10 mg RC daily as needed for moderate constipation, MiraLAX  17 g p.o. twice daily and senna docusate 1 tab p.o. twice daily; -PT/OT recommendations: Initially had recommended no follow-up before her ORIF but does not think she can go home in her current condition and wants to pursue rehab facility for discharge.  TOC consulted for assistance with discharge disposition.  Fall: Secondary to loss of balance and fall off a Step stool. S/p ORIF and PT/OT evalauting and is now NWB as tolerated   Open Left Third Digit Fracture: Secondary to fall. Cefazolin  x3 doses for antibiotic coverage. Orthopedic surgery consulted and appreciate further reccs. Fingers redressed.   Leukocytosis: Fluctuating. Likely reactive and secondary to trauma and fracture and now surgical intervention. WBC Trend slowly trending down now and went 12.8 -> 16.3 -> 12.4 -> 14.2 -> 14.4 -> 15.2 -> 15.7 -> 14.3. No fevers or symptoms for infection. Patient does have an open fracture, however. Antibiotics initiated as a Preoperative precaution. .   Generalized Anxiety Disorder: Continue Duloxetine  60 mg po Daily    Hyperlipidemia: Continue Simvastatin  20 mg po daily.   Constipation: See Bowel regimen as above; Finally had a bowel movement 05/03/24 after giving her Bisacodyl 10 mg rectal suppository  Hyponatremia: Na+ Trend went from 135 -> 133 -> 134 -> 136 -> 138. CTM and Trend and repeat CMP in the AM.   Osteoporosis: Continue Calcium -Vitamin D  1 tab po Daily w/ Breakfast  Tobacco Use: Smoking Cessation recommended and given. Nicotine  patch 21 mg TD ordered and being continued   Acute Blood Loss Anemia / Normocytic Anemia: Baseline hemoglobin appears to be around 12-13. Hemoglobin of 11.8 on admission with drift downward. Postoperatively,  hemoglobin trended down and was 6.6 this AM. Hgb/Hct now improved and is 9.0/26.9. Was going to Check Anemia Panel but now received a unit of blood yesterday AM. CTM For S/Sx of Bleeding; No overt bleeding noted. Continue to Trend CBC daily and transfuse for Hgb < 7.0.   Thrombocytosis: Likely reactive as patient's platelet count has gone from 344 -> 442. CTM for S/Sx of Bleeding; no overt bleeding noted. Repeat CBC in the AM  Hypoalbuminemia: Patient's Albumin Lvl is now 2.6. CTM & Trend & repeat CMP in the AM

## 2024-04-28 NOTE — Progress Notes (Signed)
 Orthopedic Tech Progress Note Patient Details:  [name] December 19, 1965 [id number]  Bulky compressive dressing placed to the LLE from foot to thigh and the knee immobilizer was placed over it. There is a non-stick dressing under the padding over the fracture blisters.  Ortho Devices Type of Ortho Device: Ace wrap, Cotton web roll Ortho Device/Splint Location: LLE Ortho Device/Splint Interventions: Ordered, Application, Adjustment   Post Interventions Patient Tolerated: Well Instructions Provided: Care of device, Adjustment of device  Uriah Trueba Ronal Brasil 04/28/2024, 7:34 PM

## 2024-04-28 NOTE — TOC CM/SW Note (Signed)
 Transition of Care [facility]) - Inpatient Brief Assessment   Patient Details  Name: [name] MRN: [id number] Date of Birth: 06/25/1965  [facility] CM/SW Contact:    Lucie Lunger, LCSWA Phone Number: 04/28/2024, 8:46 AM   Clinical Narrative: Transition of Care Department [facility]) has reviewed patient and no TOC needs have been identified at this time. We will continue to monitor patient advancement through interdiciplinary progression rounds. If new patient transition needs arise, please place a TOC consult.  Transition of Care Asessment: Insurance and Status: Insurance coverage has been reviewed Patient has primary care physician: Yes Home environment has been reviewed: From home Prior level of function:: Independent Prior/Current Home Services: No current home services Social Drivers of Health Review: SDOH reviewed no interventions necessary Readmission risk has been reviewed: Yes Transition of care needs: no transition of care needs at this time

## 2024-04-29 ENCOUNTER — Encounter (HOSPITAL_COMMUNITY): Payer: Self-pay | Admitting: Internal Medicine

## 2024-04-29 ENCOUNTER — Inpatient Hospital Stay (HOSPITAL_COMMUNITY)

## 2024-04-29 ENCOUNTER — Other Ambulatory Visit: Payer: Self-pay

## 2024-04-29 ENCOUNTER — Inpatient Hospital Stay (HOSPITAL_COMMUNITY): Admitting: Registered Nurse

## 2024-04-29 ENCOUNTER — Encounter (HOSPITAL_COMMUNITY)
Admission: EM | Disposition: A | Discharge status: Skilled Nursing Facility | Payer: Self-pay | Source: Home / Self Care | Attending: Internal Medicine

## 2024-04-29 DIAGNOSIS — S82143A Displaced bicondylar fracture of unspecified tibia, initial encounter for closed fracture: Secondary | ICD-10-CM | POA: Diagnosis not present

## 2024-04-29 DIAGNOSIS — S82142A Displaced bicondylar fracture of left tibia, initial encounter for closed fracture: Secondary | ICD-10-CM | POA: Diagnosis not present

## 2024-04-29 DIAGNOSIS — S82832A Other fracture of upper and lower end of left fibula, initial encounter for closed fracture: Secondary | ICD-10-CM | POA: Diagnosis not present

## 2024-04-29 DIAGNOSIS — Z4789 Encounter for other orthopedic aftercare: Secondary | ICD-10-CM | POA: Diagnosis not present

## 2024-04-29 DIAGNOSIS — S82453A Displaced comminuted fracture of shaft of unspecified fibula, initial encounter for closed fracture: Secondary | ICD-10-CM | POA: Diagnosis not present

## 2024-04-29 DIAGNOSIS — M25469 Effusion, unspecified knee: Secondary | ICD-10-CM | POA: Diagnosis not present

## 2024-04-29 HISTORY — PX: INCISION AND DRAINAGE OF WOUND: SHX1803

## 2024-04-29 HISTORY — PX: ORIF TIBIA PLATEAU: SHX2132

## 2024-04-29 LAB — CBC WITH DIFFERENTIAL/PLATELET
Abs Immature Granulocytes: 0.05 K/uL (ref 0.00–0.07)
Basophils Absolute: 0 K/uL (ref 0.0–0.1)
Basophils Relative: 0 %
Eosinophils Absolute: 0.1 K/uL (ref 0.0–0.5)
Eosinophils Relative: 1 %
HCT: 26.1 % — ABNORMAL LOW (ref 36.0–46.0)
Hemoglobin: 8.7 g/dL — ABNORMAL LOW (ref 12.0–15.0)
Immature Granulocytes: 0 %
Lymphocytes Relative: 21 %
Lymphs Abs: 2.7 K/uL (ref 0.7–4.0)
MCH: 31.9 pg (ref 26.0–34.0)
MCHC: 33.3 g/dL (ref 30.0–36.0)
MCV: 95.6 fL (ref 80.0–100.0)
Monocytes Absolute: 2.4 K/uL — ABNORMAL HIGH (ref 0.1–1.0)
Monocytes Relative: 19 %
Neutro Abs: 7.5 K/uL (ref 1.7–7.7)
Neutrophils Relative %: 59 %
Platelets: 299 K/uL (ref 150–400)
RBC: 2.73 MIL/uL — ABNORMAL LOW (ref 3.87–5.11)
RDW: 12.9 % (ref 11.5–15.5)
WBC: 12.8 K/uL — ABNORMAL HIGH (ref 4.0–10.5)
nRBC: 0 % (ref 0.0–0.2)

## 2024-04-29 LAB — CBC
HCT: 24.8 % — ABNORMAL LOW (ref 36.0–46.0)
Hemoglobin: 8.3 g/dL — ABNORMAL LOW (ref 12.0–15.0)
MCH: 32.2 pg (ref 26.0–34.0)
MCHC: 33.5 g/dL (ref 30.0–36.0)
MCV: 96.1 fL (ref 80.0–100.0)
Platelets: 281 K/uL (ref 150–400)
RBC: 2.58 MIL/uL — ABNORMAL LOW (ref 3.87–5.11)
RDW: 12.7 % (ref 11.5–15.5)
WBC: 16.3 K/uL — ABNORMAL HIGH (ref 4.0–10.5)
nRBC: 0 % (ref 0.0–0.2)

## 2024-04-29 LAB — MRSA NEXT GEN BY PCR, NASAL: MRSA by PCR Next Gen: DETECTED — AB

## 2024-04-29 LAB — BASIC METABOLIC PANEL WITH GFR
Anion gap: 9 (ref 5–15)
BUN: 10 mg/dL (ref 6–20)
CO2: 25 mmol/L (ref 22–32)
Calcium: 8 mg/dL — ABNORMAL LOW (ref 8.9–10.3)
Chloride: 97 mmol/L — ABNORMAL LOW (ref 98–111)
Creatinine, Ser: 0.76 mg/dL (ref 0.44–1.00)
GFR, Estimated: >60 mL/min (ref >60)
Glucose, Bld: 117 mg/dL — ABNORMAL HIGH (ref 70–99)
Potassium: 3.9 mmol/L (ref 3.5–5.1)
Sodium: 131 mmol/L — ABNORMAL LOW (ref 135–145)

## 2024-04-29 LAB — CREATININE, SERUM
Creatinine, Ser: 0.81 mg/dL (ref 0.44–1.00)
GFR, Estimated: >60 mL/min (ref >60)

## 2024-04-29 LAB — VITAMIN D 25 HYDROXY (VIT D DEFICIENCY, FRACTURES): Vit D, 25-Hydroxy: 37.81 ng/mL (ref 30–100)

## 2024-04-29 SURGERY — OPEN REDUCTION INTERNAL FIXATION (ORIF) TIBIAL PLATEAU
Anesthesia: General | Site: Hand | Laterality: Left

## 2024-04-29 MED ORDER — MORPHINE SULFATE (PF) 2 MG/ML IV SOLN
1.0000 mg | INTRAVENOUS | Status: AC | PRN
  Administered 2024-04-30: 1 mg via INTRAVENOUS
  Filled 2024-04-29: qty 1

## 2024-04-29 MED ORDER — MUPIROCIN 2 % EX OINT
TOPICAL_OINTMENT | Freq: Two times a day (BID) | CUTANEOUS | Status: DC
  Administered 2024-04-29 – 2024-05-02 (×3): 1 via NASAL
  Filled 2024-04-29 (×3): qty 22

## 2024-04-29 MED ORDER — OXYCODONE HCL 5 MG PO TABS: 5.0000 mg | ORAL_TABLET | Freq: Once | ORAL | Status: DC | PRN

## 2024-04-29 MED ORDER — VANCOMYCIN HCL 1000 MG IV SOLR
INTRAVENOUS | Status: AC
Start: 2024-04-29 — End: 2024-04-29
  Filled 2024-04-29: qty 20

## 2024-04-29 MED ORDER — ROCURONIUM BROMIDE 10 MG/ML (PF) SYRINGE
PREFILLED_SYRINGE | INTRAVENOUS | Status: DC | PRN
Start: 2024-04-29 — End: 2024-04-29
  Administered 2024-04-29: 60 mg via INTRAVENOUS

## 2024-04-29 MED ORDER — MEPERIDINE HCL 25 MG/ML IJ SOLN: 6.2500 mg | INTRAMUSCULAR | Status: DC | PRN

## 2024-04-29 MED ORDER — CYCLOBENZAPRINE HCL 10 MG PO TABS
10.0000 mg | ORAL_TABLET | Freq: Three times a day (TID) | ORAL | Status: DC | PRN
  Administered 2024-04-30 – 2024-05-04 (×5): 10 mg via ORAL
  Filled 2024-04-29 (×5): qty 1

## 2024-04-29 MED ORDER — SCOPOLAMINE 1 MG/3DAYS TD PT72
1.0000 | MEDICATED_PATCH | TRANSDERMAL | Status: DC
  Administered 2024-04-29: 1 mg via TRANSDERMAL
  Filled 2024-04-29: qty 1

## 2024-04-29 MED ORDER — CHLORHEXIDINE GLUCONATE CLOTH 2 % EX PADS
6.0000 | MEDICATED_PAD | Freq: Every day | CUTANEOUS | Status: DC
  Administered 2024-04-29 – 2024-05-02 (×4): 6 via TOPICAL

## 2024-04-29 MED ORDER — PNEUMOCOCCAL 20-VAL CONJ VACC 0.5 ML IM SUSY
0.5000 mL | PREFILLED_SYRINGE | INTRAMUSCULAR | Status: AC
  Administered 2024-05-05: 0.5 mL via INTRAMUSCULAR
  Filled 2024-04-29: qty 0.5

## 2024-04-29 MED ORDER — PROPOFOL 10 MG/ML IV BOLUS
INTRAVENOUS | Status: AC
Start: 2024-04-29 — End: 2024-04-29
  Filled 2024-04-29: qty 20

## 2024-04-29 MED ORDER — FENTANYL CITRATE (PF) 250 MCG/5ML IJ SOLN
INTRAMUSCULAR | Status: DC | PRN
  Administered 2024-04-29: 50 ug via INTRAVENOUS
  Administered 2024-04-29: 100 ug via INTRAVENOUS

## 2024-04-29 MED ORDER — MIDAZOLAM HCL 2 MG/2ML IJ SOLN
INTRAMUSCULAR | Status: AC
  Filled 2024-04-29: qty 2

## 2024-04-29 MED ORDER — ROCURONIUM BROMIDE 10 MG/ML (PF) SYRINGE
PREFILLED_SYRINGE | INTRAVENOUS | Status: AC
  Filled 2024-04-29: qty 10

## 2024-04-29 MED ORDER — OXYCODONE HCL 5 MG/5ML PO SOLN: 5.0000 mg | Freq: Once | ORAL | Status: DC | PRN

## 2024-04-29 MED ORDER — 0.9 % SODIUM CHLORIDE (POUR BTL) OPTIME
TOPICAL | Status: DC | PRN
  Administered 2024-04-29: 1000 mL

## 2024-04-29 MED ORDER — MIDAZOLAM HCL (PF) 2 MG/2ML IJ SOLN
INTRAMUSCULAR | Status: DC | PRN
  Administered 2024-04-29: 2 mg via INTRAVENOUS

## 2024-04-29 MED ORDER — CEFAZOLIN SODIUM-DEXTROSE 2-4 GM/100ML-% IV SOLN
2.0000 g | Freq: Three times a day (TID) | INTRAVENOUS | Status: AC
  Administered 2024-04-29 – 2024-04-30 (×3): 2 g via INTRAVENOUS
  Filled 2024-04-29 (×3): qty 100

## 2024-04-29 MED ORDER — CHLORHEXIDINE GLUCONATE 0.12 % MT SOLN
OROMUCOSAL | Status: AC
Start: 2024-04-29 — End: 2024-04-29
  Administered 2024-04-29: 15 mL via OROMUCOSAL
  Filled 2024-04-29: qty 15

## 2024-04-29 MED ORDER — CHLORHEXIDINE GLUCONATE 4 % EX SOLN: 1.0000 | CUTANEOUS | 1 refills | Status: AC

## 2024-04-29 MED ORDER — LIDOCAINE 2% (20 MG/ML) 5 ML SYRINGE
INTRAMUSCULAR | Status: AC
  Filled 2024-04-29: qty 5

## 2024-04-29 MED ORDER — ONDANSETRON HCL 4 MG/2ML IJ SOLN
INTRAMUSCULAR | Status: DC | PRN
  Administered 2024-04-29: 4 mg via INTRAVENOUS

## 2024-04-29 MED ORDER — PROPOFOL 10 MG/ML IV BOLUS
INTRAVENOUS | Status: DC | PRN
Start: 2024-04-29 — End: 2024-04-29
  Administered 2024-04-29: 120 mg via INTRAVENOUS

## 2024-04-29 MED ORDER — ONDANSETRON HCL 4 MG/2ML IJ SOLN
INTRAMUSCULAR | Status: AC
  Filled 2024-04-29: qty 2

## 2024-04-29 MED ORDER — FENTANYL CITRATE (PF) 100 MCG/2ML IJ SOLN
INTRAMUSCULAR | Status: AC
  Filled 2024-04-29: qty 2

## 2024-04-29 MED ORDER — LIDOCAINE 2% (20 MG/ML) 5 ML SYRINGE
INTRAMUSCULAR | Status: DC | PRN
Start: 2024-04-29 — End: 2024-04-29
  Administered 2024-04-29: 40 mg via INTRAVENOUS

## 2024-04-29 MED ORDER — DOCUSATE SODIUM 100 MG PO CAPS
100.0000 mg | ORAL_CAPSULE | Freq: Two times a day (BID) | ORAL | Status: DC
  Administered 2024-04-29 – 2024-05-01 (×4): 100 mg via ORAL
  Filled 2024-04-29 (×4): qty 1

## 2024-04-29 MED ORDER — ORAL CARE MOUTH RINSE: 15.0000 mL | Freq: Once | OROMUCOSAL | Status: AC

## 2024-04-29 MED ORDER — SUGAMMADEX SODIUM 200 MG/2ML IV SOLN
INTRAVENOUS | Status: DC | PRN
Start: 2024-04-29 — End: 2024-04-29
  Administered 2024-04-29: 200 mg via INTRAVENOUS

## 2024-04-29 MED ORDER — OXYCODONE HCL 5 MG PO TABS
5.0000 mg | ORAL_TABLET | ORAL | Status: AC | PRN
  Administered 2024-04-29 – 2024-04-30 (×2): 10 mg via ORAL
  Filled 2024-04-29 (×2): qty 2

## 2024-04-29 MED ORDER — MUPIROCIN 2 % EX OINT: 1.0000 | TOPICAL_OINTMENT | Freq: Two times a day (BID) | CUTANEOUS | 0 refills | Status: AC

## 2024-04-29 MED ORDER — CHLORHEXIDINE GLUCONATE 0.12 % MT SOLN: 15.0000 mL | Freq: Once | OROMUCOSAL | Status: AC

## 2024-04-29 MED ORDER — MIDAZOLAM HCL (PF) 2 MG/2ML IJ SOLN: 0.5000 mg | Freq: Once | INTRAMUSCULAR | Status: DC | PRN

## 2024-04-29 MED ORDER — ENOXAPARIN SODIUM 40 MG/0.4ML IJ SOSY
40.0000 mg | PREFILLED_SYRINGE | INTRAMUSCULAR | Status: DC
  Administered 2024-04-30 – 2024-05-05 (×6): 40 mg via SUBCUTANEOUS
  Filled 2024-04-29 (×6): qty 0.4

## 2024-04-29 MED ORDER — DEXAMETHASONE SOD PHOSPHATE PF 10 MG/ML IJ SOLN
INTRAMUSCULAR | Status: DC | PRN
  Administered 2024-04-29: 10 mg via INTRAVENOUS

## 2024-04-29 MED ORDER — HYDROMORPHONE HCL 1 MG/ML IJ SOLN: 0.2500 mg | INTRAMUSCULAR | Status: DC | PRN

## 2024-04-29 MED ORDER — LACTATED RINGERS IV SOLN: INTRAVENOUS | Status: DC

## 2024-04-29 SURGICAL SUPPLY — 56 items
BAG COUNTER SPONGE SURGICOUNT (BAG) ×2 IMPLANT
BAR GLASS FIBER EXFX 11X500 (EXFIX) IMPLANT
BIT DRILL CANN MED FLUTE 4.0 (BIT) IMPLANT
BIT DRILL ORANGE 4.0 LONG (BIT) IMPLANT
BLADE CLIPPER SURG (BLADE) IMPLANT
BLADE SURG 15 STRL LF DISP TIS (BLADE) ×2 IMPLANT
BNDG COMPR ESMARK 6X3 LF (GAUZE/BANDAGES/DRESSINGS) ×2 IMPLANT
BNDG ELASTIC 4INX 5YD STR LF (GAUZE/BANDAGES/DRESSINGS) IMPLANT
BNDG ELASTIC 4X5.8 VLCR STR LF (GAUZE/BANDAGES/DRESSINGS) ×2 IMPLANT
BNDG ELASTIC 6INX 5YD STR LF (GAUZE/BANDAGES/DRESSINGS) ×2 IMPLANT
BNDG GAUZE DERMACEA FLUFF 4 (GAUZE/BANDAGES/DRESSINGS) ×2 IMPLANT
BRUSH SCRUB EZ PLAIN DRY (MISCELLANEOUS) ×4 IMPLANT
CANISTER SUCTION 3000ML PPV (SUCTIONS) ×2 IMPLANT
CHLORAPREP W/TINT 26 (MISCELLANEOUS) ×4 IMPLANT
COVER SURGICAL LIGHT HANDLE (MISCELLANEOUS) ×2 IMPLANT
CUFF TRNQT CYL 34X4.125X (TOURNIQUET CUFF) ×2 IMPLANT
DRAPE C-ARM 42X72 X-RAY (DRAPES) ×2 IMPLANT
DRAPE C-ARMOR (DRAPES) ×2 IMPLANT
DRAPE SURG ORHT 6 SPLT 77X108 (DRAPES) ×4 IMPLANT
DRAPE U-SHAPE 47X51 STRL (DRAPES) ×2 IMPLANT
DRSG MEPITEL 4X7.2 (GAUZE/BANDAGES/DRESSINGS) IMPLANT
ELECTRODE REM PT RTRN 9FT ADLT (ELECTROSURGICAL) ×2 IMPLANT
GAUZE PAD ABD 8X10 STRL (GAUZE/BANDAGES/DRESSINGS) ×4 IMPLANT
GAUZE SPONGE 4X4 12PLY STRL (GAUZE/BANDAGES/DRESSINGS) ×2 IMPLANT
GAUZE XEROFORM 5X9 LF (GAUZE/BANDAGES/DRESSINGS) IMPLANT
GLOVE BIO SURGEON STRL SZ 6.5 (GLOVE) ×6 IMPLANT
GLOVE BIO SURGEON STRL SZ7.5 (GLOVE) ×8 IMPLANT
GLOVE BIOGEL PI IND STRL 6.5 (GLOVE) ×2 IMPLANT
GLOVE BIOGEL PI IND STRL 7.5 (GLOVE) ×2 IMPLANT
GOWN STRL REUS W/ TWL LRG LVL3 (GOWN DISPOSABLE) ×4 IMPLANT
IMMOBILIZER KNEE 22 UNIV (SOFTGOODS) ×2 IMPLANT
KIT BASIN OR (CUSTOM PROCEDURE TRAY) ×2 IMPLANT
KIT TURNOVER KIT B (KITS) ×2 IMPLANT
NDL SUT 6 .5 CRC .975X.05 MAYO (NEEDLE) ×2 IMPLANT
NDL TAPERED W/ NITINOL LOOP (MISCELLANEOUS) ×2 IMPLANT
NEEDLE TAPERED W/ NITINOL LOOP (MISCELLANEOUS) ×2 IMPLANT
PACK TOTAL JOINT (CUSTOM PROCEDURE TRAY) ×2 IMPLANT
PAD ARMBOARD POSITIONER FOAM (MISCELLANEOUS) ×4 IMPLANT
PAD CAST 4YDX4 CTTN HI CHSV (CAST SUPPLIES) ×2 IMPLANT
PADDING CAST COTTON 6X4 STRL (CAST SUPPLIES) ×2 IMPLANT
PIN BLUNT XTRFIX LG 5X160X55MM (EXFIX) IMPLANT
PIN CLAMP 2BAR 75MM BLUE (EXFIX) IMPLANT
PIN HALF 5X200X65 BLUNT TIP (EXFIX) IMPLANT
SOLN 0.9% NACL POUR BTL 1000ML (IV SOLUTION) ×2 IMPLANT
SOLN STERILE WATER BTL 1000 ML (IV SOLUTION) ×4 IMPLANT
STAPLER SKIN PROX 35W (STAPLE) ×2 IMPLANT
SUCTION TUBE FRAZIER 10FR DISP (SUCTIONS) ×2 IMPLANT
SUT ETHILON 2 0 FS 18 (SUTURE) ×2 IMPLANT
SUT ETHILON 3 0 PS 1 (SUTURE) IMPLANT
SUT VIC AB 0 CT1 27XBRD ANBCTR (SUTURE) IMPLANT
SUT VIC AB 1 CT1 18XCR BRD 8 (SUTURE) IMPLANT
SUT VIC AB 1 CT1 27XBRD ANBCTR (SUTURE) ×2 IMPLANT
SUT VIC AB 2-0 CT1 TAPERPNT 27 (SUTURE) ×4 IMPLANT
SUTURE FIBERWR #2 38 T-5 BLUE (SUTURE) IMPLANT
TOWEL GREEN STERILE (TOWEL DISPOSABLE) ×4 IMPLANT
TRAY FOLEY MTR SLVR 16FR STAT (SET/KITS/TRAYS/PACK) IMPLANT

## 2024-04-29 NOTE — Progress Notes (Signed)
 Pt is bleeding from external fixation site. RN messaged MD on call to make aware.  Bari HERO Jasmynn Pfalzgraf

## 2024-04-29 NOTE — Transfer of Care (Signed)
 Immediate Anesthesia Transfer of Care Note  Patient: [name]  Procedure(s) Performed: OPEN REDUCTION INTERNAL FIXATION (ORIF) TIBIAL PLATEAU (Left) IRRIGATION AND DEBRIDEMENT WOUND, LEFT HAND (Left: Hand)  Patient Location: PACU  Anesthesia Type:General  Level of Consciousness: awake, alert , and oriented  Airway & Oxygen Therapy: Patient Spontanous Breathing  Post-op Assessment: Report given to RN, Post -op Vital signs reviewed and stable, and Patient moving all extremities X 4  Post vital signs: Reviewed and stable  Last Vitals:  Vitals Value Taken Time  BP 141/80 04/29/24 16:11  Temp    Pulse 103 04/29/24 16:13  Resp 20 04/29/24 16:13  SpO2 93 % 04/29/24 16:13  Vitals shown include unfiled device data.  Last Pain:  Vitals:   04/29/24 1255  TempSrc: Oral  PainSc: 5       Patients Stated Pain Goal: 3 (04/27/24 1628)  Complications: There were no known notable events for this encounter.

## 2024-04-29 NOTE — Anesthesia Preprocedure Evaluation (Addendum)
 Anesthesia Evaluation  Patient identified by MRN, date of birth, ID band Patient awake    Reviewed: Allergy & Precautions, NPO status , Patient's Chart, lab work & pertinent test results  History of Anesthesia Complications Negative for: history of anesthetic complications  Airway Mallampati: I  TM Distance: >3 FB Neck ROM: Full    Dental  (+) Edentulous Upper, Edentulous Lower   Pulmonary Current Smoker and Patient abstained from smoking.   breath sounds clear to auscultation       Cardiovascular hypertension (not on BP meds),  Rhythm:Regular Rate:Normal     Neuro/Psych   Anxiety Depression    negative neurological ROS     GI/Hepatic negative GI ROS, Neg liver ROS,,,  Endo/Other  negative endocrine ROS    Renal/GU negative Renal ROS     Musculoskeletal   Abdominal   Peds  Hematology Hb 8.7, plt 299k   Anesthesia Other Findings Clemens off of a ladder:  tibia plateau and open finger fxs  Reproductive/Obstetrics                              Anesthesia Physical Anesthesia Plan  ASA: 2  Anesthesia Plan: General   Post-op Pain Management: Ofirmev  IV (intra-op)*   Induction: Intravenous  PONV Risk Score and Plan: 3 and Ondansetron , Dexamethasone  and Scopolamine patch - Pre-op  Airway Management Planned: Oral ETT  Additional Equipment: None  Intra-op Plan:   Post-operative Plan:   Informed Consent: I have reviewed the patients History and Physical, chart, labs and discussed the procedure including the risks, benefits and alternatives for the proposed anesthesia with the patient or authorized representative who has indicated his/her understanding and acceptance.     Dental advisory given  Plan Discussed with: CRNA and Surgeon  Anesthesia Plan Comments:          Anesthesia Quick Evaluation

## 2024-04-29 NOTE — Progress Notes (Signed)
 Called to bedside to evaluate bleeding from L leg ex fix site. Distal ex fix pin sites wrapped in small kerlex. Kerlex is saturated with blood and there is blood that has dripped down ACE wrap that is wrapped around leg. Kerlex removed to view site. Distal pin site of distal ex fix site with slow ooze of blood. This pin site wrapped separately with gauze then both distal sites wrapped together with small kerlex. Leg is warm and dry. Pt is alert and oriented, c/o of some pain to site. HR-100s, SBP-120s. CBC already ordered for the AM.  Continue to monitor site. Please call RRT if further assistance needed.

## 2024-04-29 NOTE — Progress Notes (Addendum)
 No transport available at this time to pick pt up. No OCT available per OR desk.  OR called again for transport at this time. [id number].

## 2024-04-29 NOTE — TOC CAGE-AID Note (Signed)
 Transition of Care [facility]) - CAGE-AID Screening   Patient Details  Name: [name] MRN: [id number] Date of Birth: September 08, 1965  Transition of [facility]) CM/SW Contact:    Dayanis Bergquist E Kienan Doublin, LCSW Phone Number: 04/29/2024, 10:17 AM   Clinical Narrative: No SA noted.   CAGE-AID Screening:    Have You Ever Felt You Ought to Cut Down on Your Drinking or Drug Use?: No Have People Annoyed You By Critizing Your Drinking Or Drug Use?: No Have You Felt Bad Or Guilty About Your Drinking Or Drug Use?: No Have You Ever Had a Drink or Used Drugs First Thing In The Morning to Steady Your Nerves or to Get Rid of a Hangover?: No CAGE-AID Score: 0  Substance Abuse Education Offered: No

## 2024-04-29 NOTE — Anesthesia Procedure Notes (Signed)
 Procedure Name: Intubation Date/Time: 04/29/2024 3:18 PM  Performed by: Christopher Comings, CRNAPre-anesthesia Checklist: Patient identified, Emergency Drugs available, Suction available and Patient being monitored Patient Re-evaluated:Patient Re-evaluated prior to induction Oxygen Delivery Method: Circle system utilized Preoxygenation: Pre-oxygenation with 100% oxygen Induction Type: IV induction Ventilation: Mask ventilation without difficulty Laryngoscope Size: Mac and 4 Grade View: Grade I Tube type: Oral Tube size: 7.0 mm Number of attempts: 1 Airway Equipment and Method: Stylet and Oral airway Placement Confirmation: ETT inserted through vocal cords under direct vision, positive ETCO2 and breath sounds checked- equal and bilateral Secured at: 22 cm Tube secured with: Tape Dental Injury: Teeth and Oropharynx as per pre-operative assessment

## 2024-04-29 NOTE — Progress Notes (Signed)
 PT Cancellation Note  Patient Details Name: [name] MRN: [id number] DOB: 17-Jul-1965   Cancelled Treatment:    Reason Eval/Treat Not Completed: Patient not medically ready (PT consult appreciated and chart reviewed. Pt pending OR today for repeat I&D of open left long middle and distal phalangeal fxs as well as ORIF for left tibia plateau fx. Will follow-up after procedure for PT evaluation.)  Randall SAUNDERS, PT, DPT Acute Rehabilitation Services Office: [phone] Secure Chat Preferred  [name] 04/29/2024, 10:05 AM

## 2024-04-29 NOTE — Interval H&P Note (Signed)
 History and Physical Interval Note:  04/29/2024 2:18 PM  [name]  has presented today for surgery, with the diagnosis of left tibial plateau fracture.  The various methods of treatment have been discussed with the patient and family. After consideration of risks, benefits and other options for treatment, the patient has consented to  Procedure(s): OPEN REDUCTION INTERNAL FIXATION (ORIF) TIBIAL PLATEAU (Left) IRRIGATION AND DEBRIDEMENT WOUND, LEFT HAND (Left) as a surgical intervention.  The patient's history has been reviewed, patient examined, no change in status, stable for surgery.  I have reviewed the patient's chart and labs.  Questions were answered to the patient's satisfaction.     [name] P Davisha Linthicum
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
## 2024-04-29 NOTE — Progress Notes (Signed)
 Patient back to room from OR. VS stable
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
## 2024-04-29 NOTE — Plan of Care (Signed)
   Problem: Activity: Goal: Risk for activity intolerance will decrease Outcome: Progressing   Problem: Pain Managment: Goal: General experience of comfort will improve and/or be controlled Outcome: Progressing   Problem: Safety: Goal: Ability to remain free from injury will improve Outcome: Progressing

## 2024-04-29 NOTE — Anesthesia Postprocedure Evaluation (Signed)
 Anesthesia Post Note  Patient: [name]  Procedure(s) Performed: OPEN REDUCTION INTERNAL FIXATION (ORIF) TIBIAL PLATEAU (Left) IRRIGATION AND DEBRIDEMENT WOUND, LEFT HAND (Left: Hand)     Patient location during evaluation: PACU Anesthesia Type: General Level of consciousness: awake Pain management: pain level controlled Vital Signs Assessment: post-procedure vital signs reviewed and stable Respiratory status: spontaneous breathing, nonlabored ventilation and respiratory function stable Cardiovascular status: blood pressure returned to baseline and stable Postop Assessment: no apparent nausea or vomiting Anesthetic complications: no   There were no known notable events for this encounter.  Last Vitals:  Vitals:   04/29/24 1630 04/29/24 1645  BP: (!) 171/69 (!) 168/78  Pulse: 97 (!) 101  Resp: 13 20  Temp:    SpO2: 95% 95%    Last Pain:  Vitals:   04/29/24 1630  TempSrc:   PainSc: 0-No pain                 Delon Aisha Arch

## 2024-04-29 NOTE — Consult Note (Signed)
 Reason for Consult:Tibia plateau fx Referring Physician: Elgin Lam Time called: 0730 Time at bedside: 0904   [name] is an 58 y.o. female.  HPI: [name] was taking down some halloween decorations when she lost her balance and fell about 3 feet off a stepladder. He had immediate left knee and hand pain. She was brought to the ED at [facility] where x-rays showed a tibia plateau and open finger fxs. Orthopedic surgery was consulted and recommended transfer to [facility] for definitive care by orthopedic trauma. She lives at home with her daughter and does not work.  Past Medical History:  Diagnosis Date   Anemia    Anxiety    Hyperlipidemia    Hypertension    Osteoporosis     Past Surgical History:  Procedure Laterality Date   ABDOMINAL HYSTERECTOMY     Partial   HARDWARE REMOVAL Left 06/20/2015   Procedure: HARDWARE REMOVAL;  Surgeon: Taft FORBES Minerva, MD;  Location: [facility];  Service: Orthopedics;  Laterality: Left;  left knee   ORIF PATELLA Left 12/29/2014   Procedure: OPEN REDUCTION INTERNAL FIXATION LEFT PATELLA;  Surgeon: Taft FORBES Minerva, MD;  Location: [facility];  Service: Orthopedics;  Laterality: Left;   TOTAL HIP ARTHROPLASTY Left 06/23/2022   Procedure: TOTAL HIP ARTHROPLASTY ANTERIOR APPROACH;  Surgeon: Fidel Rogue, MD;  Location: [facility];  Service: Orthopedics;  Laterality: Left;    Family History  Problem Relation Age of Onset   COPD Mother    Osteoporosis Mother    COPD Father    Osteoporosis Father    Colon cancer Neg Hx    Breast cancer Neg Hx     Social History:  reports that she quit smoking about 19 months ago. Her smoking use included cigarettes. She started smoking about 30 years ago. She has a 29 pack-year smoking history. She has never used smokeless tobacco. She reports that she does not drink alcohol and does not use drugs.  Allergies:  Allergies  Allergen Reactions   Codeine Other (See Comments)    Makes pt.have seizure   Hydrocodone Other (See  Comments)    May have caused a seizure per patient reported.    Medications: I have reviewed the patient's current medications.  Results for orders placed or performed during the hospital encounter of 04/27/24 (from the past 48 hours)  CBC with Differential     Status: Abnormal   Collection Time: 04/27/24  6:33 PM  Result Value Ref Range   WBC 17.5 (H) 4.0 - 10.5 K/uL   RBC 3.64 (L) 3.87 - 5.11 MIL/uL   Hemoglobin 11.8 (L) 12.0 - 15.0 g/dL   HCT 64.5 (L) 63.9 - 53.9 %   MCV 97.3 80.0 - 100.0 fL   MCH 32.4 26.0 - 34.0 pg   MCHC 33.3 30.0 - 36.0 g/dL   RDW 87.2 88.4 - 84.4 %   Platelets 392 150 - 400 K/uL   nRBC 0.0 0.0 - 0.2 %   Neutrophils Relative % 84 %   Neutro Abs 14.7 (H) 1.7 - 7.7 K/uL   Lymphocytes Relative 7 %   Lymphs Abs 1.3 0.7 - 4.0 K/uL   Monocytes Relative 8 %   Monocytes Absolute 1.4 (H) 0.1 - 1.0 K/uL   Eosinophils Relative 0 %   Eosinophils Absolute 0.0 0.0 - 0.5 K/uL   Basophils Relative 0 %   Basophils Absolute 0.0 0.0 - 0.1 K/uL   Immature Granulocytes 1 %   Abs Immature Granulocytes 0.08 (H) 0.00 -  0.07 K/uL    Comment: Performed at [facility], [address] [place], [place] [address]  Basic metabolic panel     Status: Abnormal   Collection Time: 04/27/24  6:33 PM  Result Value Ref Range   Sodium 136 135 - 145 mmol/L   Potassium 4.1 3.5 - 5.1 mmol/L   Chloride 100 98 - 111 mmol/L   CO2 27 22 - 32 mmol/L   Glucose, Bld 132 (H) 70 - 99 mg/dL    Comment: Glucose reference range applies only to samples taken after fasting for at least 8 hours.   BUN 14 6 - 20 mg/dL   Creatinine, Ser 9.29 0.44 - 1.00 mg/dL   Calcium  8.9 8.9 - 10.3 mg/dL   GFR, Estimated >39 >39 mL/min    Comment: (NOTE) Calculated using the CKD-EPI Creatinine Equation (2021)    Anion gap 9 5 - 15    Comment: Performed at [facility], [address] [place], [place] [address]  HIV Antibody (routine testing w rflx)     Status: None   Collection Time: 04/27/24 10:17 PM  Result  Value Ref Range   HIV Screen 4th Generation wRfx Non Reactive Non Reactive    Comment: Performed at [facility], [address], [place], [place] [address]  Culture, blood (Routine X 2) w Reflex to ID Panel     Status: None (Preliminary result)   Collection Time: 04/27/24 10:17 PM   Specimen: BLOOD RIGHT FOREARM  Result Value Ref Range   Specimen Description BLOOD RIGHT FOREARM    Special Requests      BOTTLES DRAWN AEROBIC AND ANAEROBIC Blood Culture adequate volume   Culture      NO GROWTH 2 DAYS Performed at [facility], [address] [place], [place] [address]    Report Status PENDING   Culture, blood (Routine X 2) w Reflex to ID Panel     Status: None (Preliminary result)   Collection Time: 04/27/24 10:17 PM   Specimen: BLOOD  Result Value Ref Range   Specimen Description BLOOD BLOOD RIGHT HAND    Special Requests      BOTTLES DRAWN AEROBIC AND ANAEROBIC Blood Culture adequate volume   Culture      NO GROWTH 2 DAYS Performed at [facility], [address], [place], [place] [address]    Report Status PENDING   Basic metabolic panel with GFR     Status: Abnormal   Collection Time: 04/28/24  4:38 AM  Result Value Ref Range   Sodium 135 135 - 145 mmol/L   Potassium 4.5 3.5 - 5.1 mmol/L   Chloride 99 98 - 111 mmol/L   CO2 27 22 - 32 mmol/L   Glucose, Bld 127 (H) 70 - 99 mg/dL    Comment: Glucose reference range applies only to samples taken after fasting for at least 8 hours.   BUN 14 6 - 20 mg/dL   Creatinine, Ser 9.32 0.44 - 1.00 mg/dL   Calcium  8.6 (L) 8.9 - 10.3 mg/dL   GFR, Estimated >39 >39 mL/min    Comment: (NOTE) Calculated using the CKD-EPI Creatinine Equation (2021)    Anion gap 9 5 - 15    Comment: Performed at [facility], [address] [place], [place] [address]  CBC with Differential/Platelet     Status: Abnormal   Collection Time: 04/28/24  4:38 AM  Result Value Ref Range   WBC 16.1 (H) 4.0 - 10.5 K/uL   RBC 3.22 (L)  3.87 - 5.11 MIL/uL    Hemoglobin 10.4 (L) 12.0 - 15.0 g/dL   HCT 68.7 (L) 63.9 - 53.9 %   MCV 96.9 80.0 - 100.0 fL   MCH 32.3 26.0 - 34.0 pg   MCHC 33.3 30.0 - 36.0 g/dL   RDW 87.2 88.4 - 84.4 %   Platelets 388 150 - 400 K/uL   nRBC 0.0 0.0 - 0.2 %   Neutrophils Relative % 70 %   Neutro Abs 11.4 (H) 1.7 - 7.7 K/uL   Lymphocytes Relative 13 %   Lymphs Abs 2.1 0.7 - 4.0 K/uL   Monocytes Relative 16 %   Monocytes Absolute 2.5 (H) 0.1 - 1.0 K/uL   Eosinophils Relative 0 %   Eosinophils Absolute 0.0 0.0 - 0.5 K/uL   Basophils Relative 0 %   Basophils Absolute 0.0 0.0 - 0.1 K/uL   Immature Granulocytes 1 %   Abs Immature Granulocytes 0.12 (H) 0.00 - 0.07 K/uL    Comment: Performed at [facility], [address] [place], [place] [address]  Magnesium      Status: None   Collection Time: 04/28/24  4:38 AM  Result Value Ref Range   Magnesium  1.8 1.7 - 2.4 mg/dL    Comment: Performed at [facility], [address] [place], [place] [address]  Phosphorus     Status: None   Collection Time: 04/28/24  4:38 AM  Result Value Ref Range   Phosphorus 3.2 2.5 - 4.6 mg/dL    Comment: Performed at [facility], [address], [place], [place] [address]  MRSA Next Gen by PCR, Nasal     Status: Abnormal   Collection Time: 04/28/24  5:52 PM   Specimen: Nasal Mucosa; Nasal Swab  Result Value Ref Range   MRSA by PCR Next Gen DETECTED (A) NOT DETECTED    Comment: CRITICAL RESULTS CALLED TO, READ BACK BY AND VERIFIED WITH : RN M.SHIESTHA ON 04/29/24 AT 0042 BY NM (NOTE) The GeneXpert MRSA Assay (FDA approved for NASAL specimens only), is one component of a comprehensive MRSA colonization surveillance program. It is not intended to diagnose MRSA infection nor to guide or monitor treatment for MRSA infections. Test performance is not FDA approved in patients less than 34 years old. Performed at [facility], [address], [place], [place] [address]   CBC with Differential/Platelet     Status: Abnormal   Collection  Time: 04/29/24  3:34 AM  Result Value Ref Range   WBC 12.8 (H) 4.0 - 10.5 K/uL   RBC 2.73 (L) 3.87 - 5.11 MIL/uL   Hemoglobin 8.7 (L) 12.0 - 15.0 g/dL   HCT 73.8 (L) 63.9 - 53.9 %   MCV 95.6 80.0 - 100.0 fL   MCH 31.9 26.0 - 34.0 pg   MCHC 33.3 30.0 - 36.0 g/dL   RDW 87.0 88.4 - 84.4 %   Platelets 299 150 - 400 K/uL   nRBC 0.0 0.0 - 0.2 %   Neutrophils Relative % 59 %   Neutro Abs 7.5 1.7 - 7.7 K/uL   Lymphocytes Relative 21 %   Lymphs Abs 2.7 0.7 - 4.0 K/uL   Monocytes Relative 19 %   Monocytes Absolute 2.4 (H) 0.1 - 1.0 K/uL   Eosinophils Relative 1 %   Eosinophils Absolute 0.1 0.0 - 0.5 K/uL   Basophils Relative 0 %   Basophils Absolute 0.0 0.0 - 0.1 K/uL   Immature Granulocytes 0 %   Abs Immature Granulocytes 0.05 0.00 -  0.07 K/uL    Comment: Performed at [facility], [address] [place], [place] [address]  Basic metabolic panel with GFR     Status: Abnormal   Collection Time: 04/29/24  3:34 AM  Result Value Ref Range   Sodium 131 (L) 135 - 145 mmol/L   Potassium 3.9 3.5 - 5.1 mmol/L   Chloride 97 (L) 98 - 111 mmol/L   CO2 25 22 - 32 mmol/L   Glucose, Bld 117 (H) 70 - 99 mg/dL    Comment: Glucose reference range applies only to samples taken after fasting for at least 8 hours.   BUN 10 6 - 20 mg/dL   Creatinine, Ser 9.23 0.44 - 1.00 mg/dL   Calcium  8.0 (L) 8.9 - 10.3 mg/dL   GFR, Estimated >39 >39 mL/min    Comment: (NOTE) Calculated using the CKD-EPI Creatinine Equation (2021)    Anion gap 9 5 - 15    Comment: Performed at [facility], [address], [place], [place] [address]  VITAMIN D  25 Hydroxy (Vit-D Deficiency, Fractures)     Status: None   Collection Time: 04/29/24  3:34 AM  Result Value Ref Range   Vit D, 25-Hydroxy 37.81 30 - 100 ng/mL    Comment: (NOTE) Vitamin D  deficiency has been defined by the Institute of Medicine  and an Endocrine Society practice guideline as a level of serum 25-OH  vitamin D  less than 20 ng/mL (1,2). The  Endocrine Society went on to  further define vitamin D  insufficiency as a level between 21 and 29  ng/mL (2).  1. IOM (Institute of Medicine). 2010. Dietary reference intakes for  calcium  and D. [place]: [employer]. 2. Holick MF, Binkley [place], Bischoff-Ferrari HA, et al. Evaluation,  treatment, and prevention of vitamin D  deficiency: an Endocrine  Society clinical practice guideline, JCEM. 2011 Jul; 96(7): 1911-30.  Performed at [facility], [address], [place], [place] [address]     CT Knee Left Wo Contrast Result Date: 04/27/2024 CLINICAL DATA:  Trauma to the left knee. EXAM: CT OF THE LEFT KNEE WITHOUT CONTRAST TECHNIQUE: Multidetector CT imaging of the left knee was performed according to the standard protocol. Multiplanar CT image reconstructions were also generated. RADIATION DOSE REDUCTION: This exam was performed according to the departmental dose-optimization program which includes automated exposure control, adjustment of the mA and/or kV according to patient size and/or use of iterative reconstruction technique. COMPARISON:  Radiograph dated 04/27/2024. FINDINGS: Bones/Joint/Cartilage Comminuted and intra-articular fracture of the proximal tibia involving the tibial spine, medial and lateral tibial plateau and extending into the proximal tibial diaphysis. There is depression of the lateral tibial plateau. There is comminuted and displaced fracture of the fibular head and neck. Prior fixation of the patella. There is severe osteopenia. No dislocation. Large suprapatellar lipohemarthrosis. Ligaments Suboptimally assessed by CT. Muscles and Tendons There is edema of the musculature of the calf.  No fluid collection. Soft tissues Subcutaneous stranding of the anterior and medial calf. IMPRESSION: 1. Comminuted intra-articular fracture of the proximal tibia as well as displaced comminuted fracture of the fibular head and neck. No dislocation. 2. Large  suprapatellar lipohemarthrosis. Electronically Signed   By: Vanetta Chou M.D.   On: 04/27/2024 19:49   DG Hand Complete Left Result Date: 04/27/2024 EXAM: 3 OR MORE VIEW(S) XRAY OF THE LEFT HAND 04/27/2024 07:20:30 PM COMPARISON: 04/27/2024 CLINICAL HISTORY: post reduction FINDINGS: BONES AND JOINTS: Acute intra-articular fracture of the distal  aspect of the third digit middle phalanx. Improved alignment of the third DIP joint status post reduction. Suspicion of small intraarticular fracture at the base of the third distal phalanx. Chondrocalcinosis of the TFCC. SOFT TISSUES: Soft tissue edema. IMPRESSION: 1. Acute intra-articular fracture of the distal aspect of the third digit middle phalanx and suspected small intraarticular fracture at the base of the third distal phalanx. Reduction of third digit dip dislocation now with normal alignment . 2. Normal alignment fifth digit. Suspected fracture of the distal aspect of the fifth proximal phalanx is not well seen post splinting. Electronically signed by: Luke Bun MD 04/27/2024 07:28 PM EST RP Workstation: [id number]   DG Chest Portable 1 View Result Date: 04/27/2024 EXAM: 1 VIEW(S) XRAY OF THE CHEST 04/27/2024 06:29:00 PM COMPARISON: 04/06/2023 CLINICAL HISTORY: pre op FINDINGS: LUNGS AND PLEURA: No focal pulmonary opacity. No pulmonary edema. No pleural effusion. No pneumothorax. HEART AND MEDIASTINUM: Aortic arch calcifications. BONES AND SOFT TISSUES: Chronic midthoracic vertebral body compression fracture. IMPRESSION: 1. No acute cardiopulmonary abnormality. Electronically signed by: Luke Bun MD 04/27/2024 06:49 PM EST RP Workstation: [id number]   DG Hand Complete Left Result Date: 04/27/2024 EXAM: 3 OR MORE VIEW(S) XRAY OF THE LEFT HAND 04/27/2024 05:35:00 PM COMPARISON: None available. CLINICAL HISTORY: fall, deformity FINDINGS: BONES AND JOINTS: Acute displaced intra-articular fracture of the distal aspect of the third digit middle  phalanx. Acute displaced fracture of the head of the fifth digit proximal phalanx. Dislocation at the third DIP joint with the distal phalanx displaced to the radial side of the middle phalanx and directed laterally. Cardiogenic calcification at the wrist. Flexion deformity of the fifth digit proximal interphalangeal joint. Scattered degenerative changes. SOFT TISSUES: Soft tissue swelling and laceration. IMPRESSION: 1. Acute displaced intra-articular fracture of the distal/head of third middle phalanx with associated distal interphalangeal joint dislocation and lateral/radial displacement of the distal phalanx with respect to the middle phalanx . 2. Acute displaced fracture of the head of the fifth proximal phalanx. 3. Soft tissue swelling and laceration. Electronically signed by: Luke Bun MD 04/27/2024 06:20 PM EST RP Workstation: [id number]   DG Knee Complete 4 Views Left Result Date: 04/27/2024 EXAM: 4 VIEW(S) XRAY OF THE LEFT KNEE 04/27/2024 05:35:00 PM COMPARISON: 07/16/2022 CLINICAL HISTORY: fall, deformity FINDINGS: BONES AND JOINTS: Patellar cerclage wire in place. Comminuted proximal tibial fracture with intra-articular extension. Displacement and slight depression of the posterior lateral tibial plateau. Comminuted fibular head and neck fracture with displacement and mild angulation. Lipohemarthrosis. Linear artifact projects over the distal shaft of the femur and the proximal lower leg. SOFT TISSUES: Surrounding soft tissue swelling. IMPRESSION: 1. Comminuted proximal tibial fracture with intra-articular extension, with displacement and slight depression of the posterior, lateral tibial plateau. 2. Comminuted fibular head and neck fracture with displacement and mild angulation. 3. Lipohemarthrosis. Electronically signed by: Luke Bun MD 04/27/2024 06:13 PM EST RP Workstation: [id number]   DG Tibia/Fibula Left Result Date: 04/27/2024 EXAM: _VIEWS_ VIEW(S) XRAY OF THE LEFT TIBIA AND FIBULA  04/27/2024 05:35:00 PM COMPARISON: None available. CLINICAL HISTORY: fall, deformity FINDINGS: BONES AND JOINTS: Patellar cerclage wires in place. Comminuted proximal tibial fracture with possible intra-articular extension. Displaced lateral tibial plateau. Comminuted fibular head and neck fracture. Joint effusion with lipohemarthrosis. SOFT TISSUES: The soft tissues are unremarkable. IMPRESSION: 1. Highly comminuted intraarticular proximal tibial fracture with lipohemarthrosis. 2. Comminuted fibular head and neck fractures. Electronically signed by: Luke Bun MD 04/27/2024 06:10 PM EST RP Workstation: [id number]    Review of Systems  HENT:  Negative for ear discharge, ear pain, hearing loss and tinnitus.   Eyes:  Negative for photophobia and pain.  Respiratory:  Negative for cough and shortness of breath.   Cardiovascular:  Negative for chest pain.  Gastrointestinal:  Negative for abdominal pain, nausea and vomiting.  Genitourinary:  Negative for dysuria, flank pain, frequency and urgency.  Musculoskeletal:  Positive for arthralgias (Left knee, left hand). Negative for back pain, myalgias and neck pain.  Neurological:  Negative for dizziness and headaches.  Hematological:  Does not bruise/bleed easily.  Psychiatric/Behavioral:  The patient is not nervous/anxious.    Blood pressure 121/84, pulse 100, temperature 98 F (36.7 C), resp. rate 17, height 5' 5 (1.651 m), weight 57.6 kg, SpO2 99%. Physical Exam Constitutional:      General: She is not in acute distress.    Appearance: She is well-developed. She is not diaphoretic.  HENT:     Head: Normocephalic and atraumatic.  Eyes:     General: No scleral icterus.       Right eye: No discharge.        Left eye: No discharge.     Conjunctiva/sclera: Conjunctivae normal.  Cardiovascular:     Rate and Rhythm: Normal rate and regular rhythm.  Pulmonary:     Effort: Pulmonary effort is normal. No respiratory distress.  Musculoskeletal:      Cervical back: Normal range of motion.     Comments: Left shoulder, elbow, wrist, digits- Middle three fingers bandaged, no instability, no blocks to motion  Sens  Ax/R/M/U intact  Mot   Ax/ R/ PIN/ M/ AIN/ U intact  Rad 2+  LLE No traumatic wounds, ecchymosis, or rash  KI, dressing in place  Sens DPN, SPN, TN intact  Motor EHL, ext, flex, evers 5/5  DP 2+, No significant edema  Skin:    General: Skin is warm and dry.  Neurological:     Mental Status: She is alert.  Psychiatric:        Mood and Affect: Mood normal.        Behavior: Behavior normal.     Assessment/Plan: Open left long middle and distal phalangeal fxs -- Repeat I&D today Left little finger distal phalangeal fx Left tibia plateau fx -- Plan ORIF today with Dr. Kendal. Please keep NPO. Multiple medical problems including hyperlipidemia, chronic anxiety, osteoporosis, and current tobacco smoker -- per primary service    Ozell DOROTHA Ned, PA-C Orthopedic Surgery [phone] 04/29/2024, 9:18 AM

## 2024-04-30 DIAGNOSIS — S82832A Other fracture of upper and lower end of left fibula, initial encounter for closed fracture: Secondary | ICD-10-CM | POA: Diagnosis not present

## 2024-04-30 DIAGNOSIS — S82142A Displaced bicondylar fracture of left tibia, initial encounter for closed fracture: Secondary | ICD-10-CM | POA: Diagnosis not present

## 2024-04-30 LAB — CBC WITH DIFFERENTIAL/PLATELET
Abs Immature Granulocytes: 0.05 K/uL (ref 0.00–0.07)
Basophils Absolute: 0 K/uL (ref 0.0–0.1)
Basophils Relative: 0 %
Eosinophils Absolute: 0 K/uL (ref 0.0–0.5)
Eosinophils Relative: 0 %
HCT: 22.2 % — ABNORMAL LOW (ref 36.0–46.0)
Hemoglobin: 7.4 g/dL — ABNORMAL LOW (ref 12.0–15.0)
Immature Granulocytes: 0 %
Lymphocytes Relative: 8 %
Lymphs Abs: 1 K/uL (ref 0.7–4.0)
MCH: 32 pg (ref 26.0–34.0)
MCHC: 33.3 g/dL (ref 30.0–36.0)
MCV: 96.1 fL (ref 80.0–100.0)
Monocytes Absolute: 1.4 K/uL — ABNORMAL HIGH (ref 0.1–1.0)
Monocytes Relative: 11 %
Neutro Abs: 10 K/uL — ABNORMAL HIGH (ref 1.7–7.7)
Neutrophils Relative %: 81 %
Platelets: 245 K/uL (ref 150–400)
RBC: 2.31 MIL/uL — ABNORMAL LOW (ref 3.87–5.11)
RDW: 12.9 % (ref 11.5–15.5)
WBC: 12.4 K/uL — ABNORMAL HIGH (ref 4.0–10.5)
nRBC: 0 % (ref 0.0–0.2)

## 2024-04-30 LAB — BASIC METABOLIC PANEL WITH GFR
Anion gap: 12 (ref 5–15)
BUN: 7 mg/dL (ref 6–20)
CO2: 25 mmol/L (ref 22–32)
Calcium: 8.1 mg/dL — ABNORMAL LOW (ref 8.9–10.3)
Chloride: 98 mmol/L (ref 98–111)
Creatinine, Ser: 0.66 mg/dL (ref 0.44–1.00)
GFR, Estimated: >60 mL/min (ref >60)
Glucose, Bld: 150 mg/dL — ABNORMAL HIGH (ref 70–99)
Potassium: 4 mmol/L (ref 3.5–5.1)
Sodium: 135 mmol/L (ref 135–145)

## 2024-04-30 MED ORDER — NICOTINE 21 MG/24HR TD PT24
21.0000 mg | MEDICATED_PATCH | Freq: Every day | TRANSDERMAL | Status: DC
  Administered 2024-04-30 – 2024-05-05 (×6): 21 mg via TRANSDERMAL
  Filled 2024-04-30 (×6): qty 1

## 2024-04-30 MED ORDER — MORPHINE SULFATE (PF) 2 MG/ML IV SOLN
1.0000 mg | INTRAVENOUS | Status: DC | PRN
  Administered 2024-05-01 – 2024-05-05 (×8): 2 mg via INTRAVENOUS
  Filled 2024-04-30 (×8): qty 1

## 2024-04-30 MED ORDER — LORATADINE 10 MG PO TABS
10.0000 mg | ORAL_TABLET | Freq: Every day | ORAL | Status: DC
  Administered 2024-04-30 – 2024-05-05 (×6): 10 mg via ORAL
  Filled 2024-04-30 (×6): qty 1

## 2024-04-30 MED ORDER — OXYCODONE HCL 5 MG PO TABS
5.0000 mg | ORAL_TABLET | ORAL | Status: DC | PRN
  Administered 2024-04-30 – 2024-05-05 (×16): 10 mg via ORAL
  Filled 2024-04-30 (×16): qty 2

## 2024-04-30 NOTE — Progress Notes (Signed)
 Called back to bedside to evaluate L leg ex fix site. Dressing that was placed earlier has some blood on the bottom portion. Dressing taken down to evaluate bleeding. There is still a slow ooze of blood from distal pin site of distal ex fix. Dressing replaced with extra kerlex added to provide pressure to site. Pt's leg warm/dry. Pt alert and oriented, c/o some pain in her L leg. Continue to monitor. Please call RRT if further assistance needed.

## 2024-04-30 NOTE — Evaluation (Signed)
 Physical Therapy Evaluation Patient Details Name: [name] MRN: [id number] DOB: 1965-08-08 Today's Date: 04/30/2024  History of Present Illness  Pt is a 58 y.o. female admitted 04/27/24 after a fall off a ~14ft step ladder hitting some concrete cinderblocks. Pt sustained a complex bicondylar left tibial plateau fracture, left third digit dip dislocation, left third digit middle phalanx and base fx, and possible left fifth proximal phalanx fx. Pt s/p closed reduction and external fixation application to LLE and I&D of left hand 11/7. Plan to monitor her soft tissue swelling and potentially perform ORIF next week. PMHx: HLD, chronic anxiety, depression, HTN, osteoporosis, and current tobacco smoker.   Clinical Impression  Pt admitted with above diagnosis. PTA, pt was independent to modI for functional mobility intermittently using SPC and independent with ADLs/IADLs. She lives with her daughter in a one story house with 1 STE. Pt currently with functional limitations due to the deficits listed below (see PT Problem List). She required minA for bed mobility, CGA for sit<>stand using RW, and CGA x2 for gait using RW. Pt is currently limited by LLE pain, weight bearing status (LLE NWB), immobility (external fixation of LLE spanning the knee joint), impaired balance, and decreased activity tolerance. Pt will benefit from acute skilled PT to increase her independence and safety with mobility to allow discharge.     If plan is discharge home, recommend the following: A little help with walking and/or transfers;A little help with bathing/dressing/bathroom;Assistance with cooking/housework;Assist for transportation;Help with stairs or ramp for entrance   Can travel by private vehicle        Equipment Recommendations Wheelchair (measurements PT);Wheelchair cushion (measurements PT) (Elevating leg rest on L.)  Recommendations for Other Services       Functional Status Assessment Patient has had a  recent decline in their functional status and demonstrates the ability to make significant improvements in function in a reasonable and predictable amount of time.     Precautions / Restrictions Precautions Precautions: Fall Recall of Precautions/Restrictions: Intact Precaution/Restrictions Comments: Ex-Fix on LLE Restrictions Weight Bearing Restrictions Per Provider Order: Yes LLE Weight Bearing Per Provider Order: Non weight bearing      Mobility  Bed Mobility Overal bed mobility: Needs Assistance Bed Mobility: Supine to Sit     Supine to sit: Min assist, HOB elevated     General bed mobility comments: Pt sat up on R side of bed. Assist to manage LLE. She pushed herself up and scooted fwd to EOB with BUE support.    Transfers Overall transfer level: Needs assistance Equipment used: Rolling walker (2 wheels) Transfers: Sit to/from Stand, Bed to chair/wheelchair/BSC Sit to Stand: Contact guard assist   Step pivot transfers: Contact guard assist, +2 safety/equipment       General transfer comment: Pt stood from lowest bed height and recliner chair. Cued proper hand placement using RW. Pt maintained LLE off the ground in order to ensure NWB. She powered up with light assist. Transferred to recliner chair. Good eccentric control.    Ambulation/Gait Ambulation/Gait assistance: Contact guard assist, +2 safety/equipment (Chair Follow) Gait Distance (Feet): 15 Feet (x2, seated rest break) Assistive device: Rolling walker (2 wheels) Gait Pattern/deviations: Step-to pattern Gait velocity: decreased     General Gait Details: Pt ambulated using a hopping technique to maintain LLE NWB at all times. Cues for safety awareness to ensure the ex-fix pins did not hit the crossbar of RW. Pt maintained an upright posture. She took short slow hops with minimal foot clearence.  Distance limited d/t fatigue.  Stairs            Wheelchair Mobility     Tilt Bed    Modified Rankin  (Stroke Patients Only)       Balance Overall balance assessment: Needs assistance Sitting-balance support: No upper extremity supported, Feet supported Sitting balance-Leahy Scale: Fair     Standing balance support: Bilateral upper extremity supported, During functional activity, Reliant on assistive device for balance Standing balance-Leahy Scale: Poor Standing balance comment: Pt dependent on RW                             Pertinent Vitals/Pain Pain Assessment Pain Assessment: 0-10 Pain Score: 9  Pain Location: LLE Pain Descriptors / Indicators: Grimacing, Discomfort, Aching Pain Intervention(s): Monitored during session, Patient requesting pain meds-RN notified, RN gave pain meds during session, Limited activity within patient's tolerance, Repositioned, Ice applied    Home Living Family/patient expects to be discharged to:: Private residence Living Arrangements: Children Available Help at Discharge: Family;Available 24 hours/day Type of Home: House Home Access: Stairs to enter Entrance Stairs-Rails: None Entrance Stairs-Number of Steps: 1   Home Layout: One level Home Equipment: Grab bars - tub/shower;Rolling Walker (2 wheels);Cane - single point;BSC/3in1      Prior Function Prior Level of Function : Independent/Modified Independent             Mobility Comments: Ambulates with intermittent use of SPC, mostly no AD though. 1 fall leading to admit off step ladder when taking down Halloween decorations. ADLs Comments: Indep with ADLs. does not drive, manages medication     Extremity/Trunk Assessment   Upper Extremity Assessment Upper Extremity Assessment: Defer to OT evaluation    Lower Extremity Assessment Lower Extremity Assessment: LLE deficits/detail LLE Deficits / Details: Pt POD 1 s/p closed reduction of tibial plateau fx with ex-fix spanning knee joint. Unable to assess knee. Decreased hip and ankle ROM. Grossly 3/5 strength. LLE: Unable to  fully assess due to pain;Unable to fully assess due to immobilization LLE Sensation: WNL LLE Coordination: decreased gross motor    Cervical / Trunk Assessment Cervical / Trunk Assessment: Normal  Communication   Communication Communication: No apparent difficulties    Cognition Arousal: Alert Behavior During Therapy: WFL for tasks assessed/performed   PT - Cognitive impairments: No apparent impairments                       PT - Cognition Comments: Pt A,Ox4 Following commands: Intact       Cueing Cueing Techniques: Verbal cues     General Comments General comments (skin integrity, edema, etc.): Pt in ace bandage from hip to foot on LLE. There is bleeding around the bandaging of the inferior shin pins of the ex-fix.    Exercises     Assessment/Plan    PT Assessment Patient needs continued PT services  PT Problem List Decreased strength;Decreased range of motion;Decreased activity tolerance;Decreased balance;Decreased mobility;Pain       PT Treatment Interventions DME instruction;Gait training;Stair training;Functional mobility training;Therapeutic activities;Therapeutic exercise;Balance training;Patient/family education;Wheelchair mobility training    PT Goals (Current goals can be found in the Care Plan section)  Acute Rehab PT Goals Patient Stated Goal: Maximize independence and return Home PT Goal Formulation: With patient Time For Goal Achievement: 05/14/24 Potential to Achieve Goals: Good Additional Goals Additional Goal #1: Pt will propel manual w/c ~135ft with supervision.    Frequency Min 2X/week  Co-evaluation PT/OT/SLP Co-Evaluation/Treatment: Yes Reason for Co-Treatment: Complexity of the patient's impairments (multi-system involvement);To address functional/ADL transfers PT goals addressed during session: Mobility/safety with mobility;Balance;Proper use of DME         AM-PAC PT 6 Clicks Mobility  Outcome Measure Help needed  turning from your back to your side while in a flat bed without using bedrails?: A Little Help needed moving from lying on your back to sitting on the side of a flat bed without using bedrails?: A Little Help needed moving to and from a bed to a chair (including a wheelchair)?: A Little Help needed standing up from a chair using your arms (e.g., wheelchair or bedside chair)?: A Little Help needed to walk in hospital room?: A Little Help needed climbing 3-5 steps with a railing? : A Lot 6 Click Score: 17    End of Session Equipment Utilized During Treatment: Gait belt Activity Tolerance: Patient tolerated treatment well Patient left: in chair;with call bell/phone within reach;with chair alarm set Nurse Communication: Mobility status;Patient requests pain meds PT Visit Diagnosis: Difficulty in walking, not elsewhere classified (R26.2);Other abnormalities of gait and mobility (R26.89);Unsteadiness on feet (R26.81)    Time: 8885-8861 PT Time Calculation (min) (ACUTE ONLY): 24 min   Charges:   PT Evaluation $PT Eval Moderate Complexity: 1 Mod   PT General Charges $$ ACUTE PT VISIT: 1 Visit         Randall SAUNDERS, PT, DPT Acute Rehabilitation Services Office: [phone] Secure Chat Preferred  [name] 04/30/2024, 12:35 PM

## 2024-04-30 NOTE — Evaluation (Addendum)
 Occupational Therapy Evaluation Patient Details Name: [name] MRN: [id number] DOB: 09-Oct-1965 Today's Date: 04/30/2024   History of Present Illness   Pt is a 58 y.o. female admitted 04/27/24 after a fall off a ~47ft step ladder hitting some concrete cinderblocks. Pt sustained a complex bicondylar left tibial plateau fracture, left third digit dip dislocation, left third digit middle phalanx and base fx, and possible left fifth proximal phalanx fx. Pt s/p closed reduction and external fixation application to LLE and I&D of left hand 11/7. Plan to monitor her soft tissue swelling and potentially perform ORIF next week. PMHx: HLD, chronic anxiety, depression, HTN, osteoporosis, and current tobacco smoker.     Clinical Impressions [name] was evaluated s/p the above admission list. She is indep at baseline. Upon evaluation the pt was limited by LLE ex fix and NWB, unsteady gait and decreased activity tolerance. Overall she completed bed mobility with min A, transfers and functional mobility with CGA for safety only. Due to the deficits listed below the pt also needs up to min A for LB ADLs with cues for compensatory techniques. Pt will benefit from continued acute OT services to discharge home without follow up OT.   OT inquired if footplate fabrication will be necessary - await reply.  1242 addendum: no footplate needed at this time.       If plan is discharge home, recommend the following:   A little help with walking and/or transfers;A lot of help with bathing/dressing/bathroom;Assistance with cooking/housework;Assist for transportation     Functional Status Assessment   Patient has had a recent decline in their functional status and demonstrates the ability to make significant improvements in function in a reasonable and predictable amount of time.     Equipment Recommendations   Tub/shower seat      Precautions/Restrictions   Precautions Precautions: Fall Recall of  Precautions/Restrictions: Intact Precaution/Restrictions Comments: Ex-Fix on LLE Restrictions Weight Bearing Restrictions Per Provider Order: Yes LLE Weight Bearing Per Provider Order: Non weight bearing     Mobility Bed Mobility Overal bed mobility: Needs Assistance Bed Mobility: Supine to Sit     Supine to sit: Min assist, HOB elevated     General bed mobility comments: Pt sat up on R side of bed. Assist to manage LLE. She pushed herself up and scooted fwd to EOB with BUE support.    Transfers Overall transfer level: Needs assistance Equipment used: Rolling walker (2 wheels) Transfers: Sit to/from Stand, Bed to chair/wheelchair/BSC Sit to Stand: Contact guard assist     Step pivot transfers: Contact guard assist, +2 safety/equipment     General transfer comment: Pt stood from lowest bed height and recliner chair. Cued proper hand placement using RW. Pt maintained LLE off the ground in order to ensure NWB. She powered up with light assist. Transferred to recliner chair. Good eccentric control.      Balance Overall balance assessment: Needs assistance Sitting-balance support: No upper extremity supported, Feet supported Sitting balance-Leahy Scale: Fair     Standing balance support: Bilateral upper extremity supported, During functional activity, Reliant on assistive device for balance Standing balance-Leahy Scale: Poor Standing balance comment: Pt dependent on RW                           ADL either performed or assessed with clinical judgement   ADL Overall ADL's : Needs assistance/impaired Eating/Feeding: Independent   Grooming: Set up;Sitting   Upper Body Bathing: Set up;Sitting  Lower Body Bathing: Minimal assistance;Sit to/from stand   Upper Body Dressing : Set up;Sitting   Lower Body Dressing: Minimal assistance;Sit to/from stand   Toilet Transfer: Contact guard assist;Ambulation;Regular Toilet;Rolling walker (2 wheels)   Toileting-  Clothing Manipulation and Hygiene: Modified independent;Sitting/lateral lean       Functional mobility during ADLs: Contact guard assist;Rolling walker (2 wheels) General ADL Comments: cues for NWB and LLE management     Vision Baseline Vision/History: 1 Wears glasses Vision Assessment?: No apparent visual deficits     Perception Perception: Within Functional Limits       Praxis Praxis: WFL       Pertinent Vitals/Pain Pain Assessment Pain Assessment: 0-10 Pain Score: 9  Pain Location: LLE Pain Descriptors / Indicators: Grimacing, Discomfort, Aching Pain Intervention(s): Monitored during session, Patient requesting pain meds-RN notified     Extremity/Trunk Assessment Upper Extremity Assessment Upper Extremity Assessment: LUE deficits/detail LUE Deficits / Details: known 3rd adn 5th digit fractures - WBAT, pt is using hand functionally. denies significant pain   Lower Extremity Assessment Lower Extremity Assessment: LLE deficits/detail LLE Deficits / Details: Pt POD 1 s/p closed reduction of tibial plateau fx with ex-fix spanning knee joint. Unable to assess knee. Decreased hip and ankle ROM. Grossly 3/5 strength. LLE: Unable to fully assess due to pain;Unable to fully assess due to immobilization LLE Sensation: WNL LLE Coordination: decreased gross motor   Cervical / Trunk Assessment Cervical / Trunk Assessment: Normal   Communication Communication Communication: No apparent difficulties   Cognition Arousal: Alert Behavior During Therapy: WFL for tasks assessed/performed Cognition: No apparent impairments             OT - Cognition Comments: Can be impulsive with movement but overall WFL                 Following commands: Intact       Cueing  General Comments   Cueing Techniques: Verbal cues  VSS - OT inquired with MD/PA if footplate is neccessary    Home Living Family/patient expects to be discharged to:: Private residence Living  Arrangements: Children Available Help at Discharge: Family;Available 24 hours/day Type of Home: House Home Access: Stairs to enter [employer] of Steps: 1 Entrance Stairs-Rails: None Home Layout: One level     Bathroom Shower/Tub: Chief Strategy Officer: Handicapped height     Home Equipment: Grab bars - tub/shower;Rolling Environmental Consultant (2 wheels);Cane - single point;BSC/3in1          Prior Functioning/Environment Prior Level of Function : Independent/Modified Independent             Mobility Comments: Ambulates with intermittent use of SPC, mostly no AD though. 1 fall leading to admit off step ladder when taking down Halloween decorations. ADLs Comments: Indep with ADLs. does not drive, manages medication    OT Problem List: Decreased strength;Decreased range of motion;Decreased activity tolerance;Impaired balance (sitting and/or standing);Impaired UE functional use;Decreased safety awareness;Decreased knowledge of use of DME or AE;Decreased knowledge of precautions   OT Treatment/Interventions: Self-care/ADL training;Therapeutic exercise;DME and/or AE instruction;Therapeutic activities;Patient/family education;Balance training      OT Goals(Current goals can be found in the care plan section)   Acute Rehab OT Goals Patient Stated Goal: home OT Goal Formulation: With patient Time For Goal Achievement: 05/14/24 Potential to Achieve Goals: Good   OT Frequency:  Min 2X/week    Co-evaluation PT/OT/SLP Co-Evaluation/Treatment: Yes Reason for Co-Treatment: Complexity of the patient's impairments (multi-system involvement);To address functional/ADL transfers PT goals addressed  during session: Mobility/safety with mobility;Balance;Proper use of DME        AM-PAC OT 6 Clicks Daily Activity     Outcome Measure Help from another person eating meals?: None Help from another person taking care of personal grooming?: A Little Help from another person  toileting, which includes using toliet, bedpan, or urinal?: A Little Help from another person bathing (including washing, rinsing, drying)?: A Little Help from another person to put on and taking off regular upper body clothing?: A Little Help from another person to put on and taking off regular lower body clothing?: A Little 6 Click Score: 19   End of Session Equipment Utilized During Treatment: Rolling walker (2 wheels) Nurse Communication: Mobility status  Activity Tolerance: Patient tolerated treatment well Patient left: in chair;with call bell/phone within reach;with chair alarm set  OT Visit Diagnosis: Unsteadiness on feet (R26.81);Other abnormalities of gait and mobility (R26.89);Muscle weakness (generalized) (M62.81);Pain                Time: 8888-8861 OT Time Calculation (min): 27 min Charges:  OT General Charges $OT Visit: 1 Visit OT Evaluation $OT Eval Moderate Complexity: 1 Mod  Lucie Kendall, OTR/L Acute Rehabilitation Services Office [phone] Secure Chat Communication Preferred   Lucie JONETTA Kendall 04/30/2024, 12:57 PM

## 2024-04-30 NOTE — Progress Notes (Signed)
 PROGRESS NOTE    [name]  FMW:[id number] DOB: [birth year] DOA: 04/27/2024 PCP: Lavell Bari [name], FNP   Brief Narrative: [name] is a 58 y.o. female with a history of hyperlipidemia, anxiety, osteoporosis, tobacco use.  Patient presented secondary to a fall after losing her balance on a stool, suffering left tibia plateau and fibular head/neck fractures, in addition to an open left third digit finger. Empiric antibiotics started and orthopedic surgery consulted for management, performing external fixation of left leg/tibia and closed reduction of left tibial plateau fracture.   Assessment/Plan:  Closed comminuted fracture of left tibia plateau Closed comminuted fracture of the left fibular head/neck Secondary to trauma from fall and noted on x-ray/CT imaging. Complicated by underlying osteoporosis. Orthopedic surgery consulted and performed external fixation of the left leg/tibia and closed reduction of the left tibial plateau fracture; consideration potential ORIF inpatient vs outpatient depending on patient's leg swelling. Recommendation for nonweightbearing and Lovenox  per orthopedic surgery. -PT/OT recommendations: pending  Fall Secondary to loss of balance and fall off a stool. -PT/OT recommendations: pending  Open left third digit fracture Secondary to fall. Cefazolin  started for antibiotic coverage. Orthopedic surgery consulted. Fingers redressed.  Leukocytosis Likely reactive and secondary to trauma and fracture. No fevers or symptoms for infection. Patient does have an open fracture, however. Antibiotics started.  Generalized anxiety disorder -Continue Cymbalta   Acute blood loss anemia Baseline hemoglobin appears to be around 12-13. Hemoglobin of 11.8 on admission with drift downward. Postoperatively, hemoglobin is down to 7.4. -Trend CBC -Transfuse for hemoglobin less thn 7  Hyperlipidemia -Continue simvastatin   Osteoporosis -Continue calcium  and  vitamin D   Tobacco use Cessation recommended. Nicotine  patch ordered. -Continue nicotine  patch   DVT prophylaxis: SCDs Code Status:   Code Status: Full Code Family Communication: None at bedside Disposition Plan: Discharge home vs SNF pending ongoing orthopedic surgery recommendations/management and PT/OT recommendations   Consultants:  Orthopedic surgery  Procedures:  None  Antimicrobials: None    Subjective: Feeling much better today. Some oozing of blood from her pin site.  Objective: BP 109/78 (BP Location: Left Arm)   Pulse 93   Temp 97.8 F (36.6 C) (Oral)   Resp 18   Ht 5' 5 (1.651 m)   Wt 57.6 kg   SpO2 100%   BMI 21.13 kg/m   Examination:  General exam: Appears calm and comfortable. Respiratory system: Clear to auscultation. Respiratory effort normal. Cardiovascular system: S1 & S2 heard, fast rate with normal rhythm. No murmur. Gastrointestinal system: Abdomen is nondistended, soft and nontender. Normal bowel sounds heard. Central nervous system: Alert and oriented. No focal neurological deficits. Musculoskeletal: Left leg with external fixation device noted. Psychiatry: Judgement and insight appear normal. Mood & affect appropriate.    Data Reviewed: I have personally reviewed following labs and imaging studies   Last CBC Lab Results  Component Value Date   WBC 12.4 (H) 04/30/2024   HGB 7.4 (L) 04/30/2024   HCT 22.2 (L) 04/30/2024   MCV 96.1 04/30/2024   MCH 32.0 04/30/2024   RDW 12.9 04/30/2024   PLT 245 04/30/2024     Last metabolic panel Lab Results  Component Value Date   GLUCOSE 150 (H) 04/30/2024   NA 135 04/30/2024   K 4.0 04/30/2024   CL 98 04/30/2024   CO2 25 04/30/2024   BUN 7 04/30/2024   CREATININE 0.66 04/30/2024   GFRNONAA >60 04/30/2024   CALCIUM  8.1 (L) 04/30/2024   PHOS 3.2 04/28/2024   PROT 6.3  04/07/2024   ALBUMIN 4.1 04/07/2024   LABGLOB 2.2 04/07/2024   AGRATIO 1.6 10/09/2022   BILITOT <0.2 04/07/2024    ALKPHOS 62 04/07/2024   AST 21 04/07/2024   ALT 14 04/07/2024   ANIONGAP 12 04/30/2024     Creatinine Clearance: Estimated Creatinine Clearance: 69 mL/min (by C-G formula based on SCr of 0.66 mg/dL).  Recent Results (from the past 240 hours)  Culture, blood (Routine X 2) w Reflex to ID Panel     Status: None (Preliminary result)   Collection Time: 04/27/24 10:17 PM   Specimen: BLOOD RIGHT FOREARM  Result Value Ref Range Status   Specimen Description BLOOD RIGHT FOREARM  Final   Special Requests   Final    BOTTLES DRAWN AEROBIC AND ANAEROBIC Blood Culture adequate volume   Culture   Final    NO GROWTH 3 DAYS Performed at [facility], [address], [place], [place] [address]    Report Status PENDING  Incomplete  Culture, blood (Routine X 2) w Reflex to ID Panel     Status: None (Preliminary result)   Collection Time: 04/27/24 10:17 PM   Specimen: BLOOD  Result Value Ref Range Status   Specimen Description BLOOD BLOOD RIGHT HAND  Final   Special Requests   Final    BOTTLES DRAWN AEROBIC AND ANAEROBIC Blood Culture adequate volume   Culture   Final    NO GROWTH 3 DAYS Performed at [facility], [address], [place], [place] [address]    Report Status PENDING  Incomplete  MRSA Next Gen by PCR, Nasal     Status: Abnormal   Collection Time: 04/28/24  5:52 PM   Specimen: Nasal Mucosa; Nasal Swab  Result Value Ref Range Status   MRSA by PCR Next Gen DETECTED (A) NOT DETECTED Final    Comment: CRITICAL RESULTS CALLED TO, READ BACK BY AND VERIFIED WITH : RN M.SHIESTHA ON 04/29/24 AT 0042 BY NM (NOTE) The GeneXpert MRSA Assay (FDA approved for NASAL specimens only), is one component of a comprehensive MRSA colonization surveillance program. It is not intended to diagnose MRSA infection nor to guide or monitor treatment for MRSA infections. Test performance is not FDA approved in patients less than 24 years old. Performed at [facility], [address], [place],  [place] [address]       Radiology Studies: DG Knee Left Port Result Date: 04/29/2024 EXAM: 1 OR 2 VIEW(S) XRAY OF THE KNEE 04/29/2024 04:41:00 PM COMPARISON: Intraoperative films from earlier in the same day. CLINICAL HISTORY: Fracture [id number] FINDINGS: BONES AND JOINTS: External fixation device noted. Internal fixation of patella with pins. Comminuted tibial plateau fracture. Comminuted fracture of proximal fibula. Joint effusion with fat fluid level. SOFT TISSUES: The soft tissues are unremarkable. IMPRESSION: 1. Comminuted tibial plateau fracture and comminuted proximal fibular fracture. 2. Lipohemarthrosis. 3. External fixation device and patellar internal fixation hardware present. Electronically signed by: Oneil Devonshire MD 04/29/2024 11:07 PM EST RP Workstation: [id number]   DG Knee 1-2 Views Left Result Date: 04/29/2024 EXAM: FLUOROSCOPIC IMAGES, 8 VIEWS TECHNIQUE: Fluoroscopy was provided by the radiology department for procedure. Radiologist was not present during examination. FLUOROSCOPY DOSE AND TYPE: Reference Air Kerma (in mGy) = 0.4 Fluoro time: 12 secs COMPARISON: Comparison study 04/27/2024. CLINICAL HISTORY: 461500 Elective surgery 461500 Elective surgery. FINDINGS: Intraoperative fluoroscopic imaging was performed. Prior patellar fixation is noted. Screws were extended into the midshaft of the tibia and mid to distal femur for placement of external fixator. The  fracture fragments were reduced following placement of the external fixator. IMPRESSION: 1. Intraoperative fluoroscopic spot images documenting external fixator screw placement across the distal femur and proximal tibia with interval fracture fragment reduction. NOTE: Intraoperative fluoroscopic spot images as above. Please refer to the intraoperative report for full details. Electronically signed by: Oneil Devonshire MD 04/29/2024 11:06 PM EST RP Workstation: [id number] C-Arm 1-60 Min-No Report Result Date: 04/29/2024 Fluoroscopy was  utilized by the requesting physician.  No radiographic interpretation.      LOS: 3 days    Elgin Lam, MD [facility] 04/30/2024, 12:09 PM   If 7PM-7AM, please contact night-coverage [email or web address]

## 2024-04-30 NOTE — Progress Notes (Signed)
 Orthopaedic Trauma Service Progress Note  Patient ID: KAYLIN MARCON MRN: [id number] DOB/AGE: 1965/11/27 58 y.o.  Subjective:  Doing well No complaints  Pain tolerable   Agreeable to SNF if needed   No CP or SOB   ROS As above  Today's  total administered Morphine  Milligram Equivalents: 33 Yesterday's total administered Morphine  Milligram Equivalents: 60  Objective:   VITALS:   Vitals:   04/29/24 1932 04/30/24 0434 04/30/24 0738 04/30/24 0834  BP: 125/84 92/63 104/65 109/78  Pulse: (!) 117 79 91 93  Resp: 18 18 17 18   Temp: 99.5 F (37.5 C) 98.4 F (36.9 C) 97.7 F (36.5 C) 97.8 F (36.6 C)  TempSrc: Rectal   Oral  SpO2: 100% 100% 99% 100%  Weight:      Height:        Estimated body mass index is 21.13 kg/m as calculated from the following:   Height as of this encounter: 5' 5 (1.651 m).   Weight as of this encounter: 57.6 kg.   Intake/Output      11/07 0701 11/08 0700 11/08 0701 11/09 0700   P.O. 480    I.V. (mL/kg) 1300 (22.6)    IV Piggyback 200    Total Intake(mL/kg) 1980 (34.4)    Urine (mL/kg/hr) 1300 (0.9) 500 (1.4)   Stool 0    Blood 50    Total Output 1350 500   Net +630 -500        Urine Occurrence 0 x 1 x   Stool Occurrence 0 x      LABS  Results for orders placed or performed during the hospital encounter of 04/27/24 (from the past 24 hours)  CBC     Status: Abnormal   Collection Time: 04/29/24  6:47 PM  Result Value Ref Range   WBC 16.3 (H) 4.0 - 10.5 K/uL   RBC 2.58 (L) 3.87 - 5.11 MIL/uL   Hemoglobin 8.3 (L) 12.0 - 15.0 g/dL   HCT 75.1 (L) 63.9 - 53.9 %   MCV 96.1 80.0 - 100.0 fL   MCH 32.2 26.0 - 34.0 pg   MCHC 33.5 30.0 - 36.0 g/dL   RDW 87.2 88.4 - 84.4 %   Platelets 281 150 - 400 K/uL   nRBC 0.0 0.0 - 0.2 %  Creatinine, serum     Status: None   Collection Time: 04/29/24  6:47 PM  Result Value Ref Range   Creatinine, Ser 0.81 0.44 - 1.00 mg/dL    GFR, Estimated >39 >39 mL/min  CBC with Differential/Platelet     Status: Abnormal   Collection Time: 04/30/24  6:01 AM  Result Value Ref Range   WBC 12.4 (H) 4.0 - 10.5 K/uL   RBC 2.31 (L) 3.87 - 5.11 MIL/uL   Hemoglobin 7.4 (L) 12.0 - 15.0 g/dL   HCT 77.7 (L) 63.9 - 53.9 %   MCV 96.1 80.0 - 100.0 fL   MCH 32.0 26.0 - 34.0 pg   MCHC 33.3 30.0 - 36.0 g/dL   RDW 87.0 88.4 - 84.4 %   Platelets 245 150 - 400 K/uL   nRBC 0.0 0.0 - 0.2 %   Neutrophils Relative % 81 %   Neutro Abs 10.0 (H) 1.7 - 7.7 K/uL   Lymphocytes Relative 8 %   Lymphs Abs 1.0 0.7 -  4.0 K/uL   Monocytes Relative 11 %   Monocytes Absolute 1.4 (H) 0.1 - 1.0 K/uL   Eosinophils Relative 0 %   Eosinophils Absolute 0.0 0.0 - 0.5 K/uL   Basophils Relative 0 %   Basophils Absolute 0.0 0.0 - 0.1 K/uL   Immature Granulocytes 0 %   Abs Immature Granulocytes 0.05 0.00 - 0.07 K/uL  Basic metabolic panel with GFR     Status: Abnormal   Collection Time: 04/30/24  6:01 AM  Result Value Ref Range   Sodium 135 135 - 145 mmol/L   Potassium 4.0 3.5 - 5.1 mmol/L   Chloride 98 98 - 111 mmol/L   CO2 25 22 - 32 mmol/L   Glucose, Bld 150 (H) 70 - 99 mg/dL   BUN 7 6 - 20 mg/dL   Creatinine, Ser 9.33 0.44 - 1.00 mg/dL   Calcium  8.1 (L) 8.9 - 10.3 mg/dL   GFR, Estimated >39 >39 mL/min   Anion gap 12 5 - 15     PHYSICAL EXAM:   Gen: sitting up in bed, pleasant, NAD Lungs: unlabored Cardiac: reg Ext:       Left Lower Extremity Spanning knee external fixator is in place   Bloody drainage from distal tibial pin sites appears to be stabilized  Femoral pin sites look good Compressive dressing fitting nicely  Extremity is warm  No DCT  Compartments are soft  No pain out of proportion with passive stretching of toes or ankle  DPN, SPN, TN sensory functions are intact  EHL, FHL, lesser toe motor functions intact  Ankle flexion, extension, inversion eversion intact  + DP pulse  Assessment/Plan: 1 Day Post-Op   Principal  Problem:   Closed fracture of left tibial plateau, initial encounter   Anti-infectives (From admission, onward)    Start     Dose/Rate Route Frequency Ordered Stop   04/29/24 2200  ceFAZolin  (ANCEF ) IVPB 2g/100 mL premix        2 g 200 mL/hr over 30 Minutes Intravenous Every 8 hours 04/29/24 1742 04/30/24 2159   04/29/24 0600  ceFAZolin  (ANCEF ) IVPB 2g/100 mL premix        2 g 200 mL/hr over 30 Minutes Intravenous On call to O.R. 04/28/24 1743 04/29/24 1549   04/27/24 2200  ceFAZolin  (ANCEF ) IVPB 2g/100 mL premix  Status:  Discontinued        2 g 200 mL/hr over 30 Minutes Intravenous Every 8 hours 04/27/24 2059 04/29/24 1742   04/27/24 1900  ceFAZolin  (ANCEF ) IVPB 1 g/50 mL premix  Status:  Discontinued        2 g 200 mL/hr over 30 Minutes Intravenous  Once 04/27/24 1855 04/27/24 2159     .  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  She underwent external fixation yesterday.  Nonweightbearing left leg for now    Recheck soft tissue on Monday to get a better gauge on when the next surgery will take place   Aggressive ice and elevation for swelling control    Move toes is much as possible as well as her ankle to help with swelling control as well   If surgery is going to be more than 10 days out she may benefit from getting a foot plate from OT to help maintain neutral ankle position  -  Left hand wounds  Local care  - Pain management:  Multimodal  - ABL anemia/Hemodynamics  Monitor  Agree with transfusing with hemoglobin less than 7  - Medical issues   Per primary  - DVT/PE prophylaxis:  Lovenox   - ID:   Perioperative antibiotics  - Metabolic Bone Disease:  Vitamin D  levels look reasonable  - Activity:  Activity as tolerated while maintaining  nonweightbearing on left leg.  Okay to work with therapies  - FEN/GI prophylaxis/Foley/Lines:  Regular diet  -Ex-fix/Splint care:  Okay to manipulate leg by fixator  Orders for pin care has been placed  - Impediments to fracture healing:  History of osteoporosis  - Dispo:  Current ortho issues with been stabilized  Reevaluate soft tissue on Monday  Continue with inpatient care  [facility] consult for SNF unless if she is able to mobilize adequately and has appropriate help at home   Francis MICAEL Mt, PA-C [phone] (C) 04/30/2024, 1:20 PM  [facility] [address] [place] [address] [phone] (F)    After 5pm and on the weekends please log on to Amion, go to orthopaedics and the look under the [facility] Call for the provider(s) on call. You can also call our office at [phone] and then follow the prompts to be connected to the call team.  Patient ID: Holli [name] Sharps, female   DOB: 1965-11-19, 58 y.o.   MRN: [id number]

## 2024-04-30 NOTE — Plan of Care (Signed)

## 2024-05-01 DIAGNOSIS — S82142A Displaced bicondylar fracture of left tibia, initial encounter for closed fracture: Secondary | ICD-10-CM | POA: Diagnosis not present

## 2024-05-01 DIAGNOSIS — D72829 Elevated white blood cell count, unspecified: Secondary | ICD-10-CM | POA: Diagnosis not present

## 2024-05-01 LAB — CBC WITH DIFFERENTIAL/PLATELET
Abs Immature Granulocytes: 0.08 K/uL — ABNORMAL HIGH (ref 0.00–0.07)
Abs Immature Granulocytes: 0.06 K/uL (ref 0.00–0.07)
Basophils Absolute: 0 K/uL (ref 0.0–0.1)
Basophils Absolute: 0 K/uL (ref 0.0–0.1)
Basophils Relative: 0 %
Basophils Relative: 0 %
Eosinophils Absolute: 0.3 K/uL (ref 0.0–0.5)
Eosinophils Absolute: 0.5 K/uL (ref 0.0–0.5)
Eosinophils Relative: 2 %
Eosinophils Relative: 3 %
HCT: 21.7 % — ABNORMAL LOW (ref 36.0–46.0)
HCT: 22.4 % — ABNORMAL LOW (ref 36.0–46.0)
Hemoglobin: 7.2 g/dL — ABNORMAL LOW (ref 12.0–15.0)
Hemoglobin: 7.4 g/dL — ABNORMAL LOW (ref 12.0–15.0)
Immature Granulocytes: 1 %
Immature Granulocytes: 0 %
Lymphocytes Relative: 22 %
Lymphocytes Relative: 21 %
Lymphs Abs: 3.1 K/uL (ref 0.7–4.0)
Lymphs Abs: 3 K/uL (ref 0.7–4.0)
MCH: 32.1 pg (ref 26.0–34.0)
MCH: 32 pg (ref 26.0–34.0)
MCHC: 33.2 g/dL (ref 30.0–36.0)
MCHC: 33 g/dL (ref 30.0–36.0)
MCV: 96.9 fL (ref 80.0–100.0)
MCV: 97 fL (ref 80.0–100.0)
Monocytes Absolute: 2.1 K/uL — ABNORMAL HIGH (ref 0.1–1.0)
Monocytes Absolute: 1.9 K/uL — ABNORMAL HIGH (ref 0.1–1.0)
Monocytes Relative: 15 %
Monocytes Relative: 13 %
Neutro Abs: 8.6 K/uL — ABNORMAL HIGH (ref 1.7–7.7)
Neutro Abs: 8.9 K/uL — ABNORMAL HIGH (ref 1.7–7.7)
Neutrophils Relative %: 60 %
Neutrophils Relative %: 63 %
Platelets: 324 K/uL (ref 150–400)
Platelets: 329 K/uL (ref 150–400)
RBC: 2.24 MIL/uL — ABNORMAL LOW (ref 3.87–5.11)
RBC: 2.31 MIL/uL — ABNORMAL LOW (ref 3.87–5.11)
RDW: 13.2 % (ref 11.5–15.5)
RDW: 13.1 % (ref 11.5–15.5)
WBC: 14.2 K/uL — ABNORMAL HIGH (ref 4.0–10.5)
WBC: 14.4 K/uL — ABNORMAL HIGH (ref 4.0–10.5)
nRBC: 0 % (ref 0.0–0.2)
nRBC: 0 % (ref 0.0–0.2)

## 2024-05-01 LAB — BASIC METABOLIC PANEL WITH GFR
Anion gap: 11 (ref 5–15)
BUN: 11 mg/dL (ref 6–20)
CO2: 26 mmol/L (ref 22–32)
Calcium: 8.7 mg/dL — ABNORMAL LOW (ref 8.9–10.3)
Chloride: 96 mmol/L — ABNORMAL LOW (ref 98–111)
Creatinine, Ser: 0.76 mg/dL (ref 0.44–1.00)
GFR, Estimated: >60 mL/min (ref >60)
Glucose, Bld: 97 mg/dL (ref 70–99)
Potassium: 3.8 mmol/L (ref 3.5–5.1)
Sodium: 133 mmol/L — ABNORMAL LOW (ref 135–145)

## 2024-05-01 MED ORDER — SENNOSIDES-DOCUSATE SODIUM 8.6-50 MG PO TABS
1.0000 | ORAL_TABLET | Freq: Two times a day (BID) | ORAL | Status: DC
  Administered 2024-05-01 – 2024-05-05 (×8): 1 via ORAL
  Filled 2024-05-01 (×8): qty 1

## 2024-05-01 MED ORDER — POLYETHYLENE GLYCOL 3350 17 G PO PACK
17.0000 g | PACK | Freq: Two times a day (BID) | ORAL | Status: DC
  Administered 2024-05-01 – 2024-05-05 (×8): 17 g via ORAL
  Filled 2024-05-01 (×8): qty 1

## 2024-05-01 MED ORDER — BISACODYL 10 MG RE SUPP: 10.0000 mg | Freq: Every day | RECTAL | Status: DC | PRN

## 2024-05-01 NOTE — Plan of Care (Signed)
  Problem: Education: Goal: Knowledge of General Education information will improve Description: Including pain rating scale, medication(s)/side effects and non-pharmacologic comfort measures Outcome: Progressing   Problem: Activity: Goal: Risk for activity intolerance will decrease Outcome: Progressing   Problem: Pain Managment: Goal: General experience of comfort will improve and/or be controlled Outcome: Progressing

## 2024-05-01 NOTE — Progress Notes (Signed)
 PROGRESS NOTE    [name]  FMW:[id number] DOB: 12/22/1965 DOA: 04/27/2024 PCP: Lavell Bari [name], FNP   Brief Narrative:  The patient is 58 y.o. female with medical history significant for hyperlipidemia, chronic anxiety, osteoporosis, current tobacco smoker, who presents to the ER via EMS from home after a mechanical fall. The patient was on a stepstool, taking down her halloween decorations when she lost her balance and fell.    In the ER, she was noted to have deformity and laceration to her left middle finger with fracture.  Imaging revealed comminuted proximal tibia and fibula fracture.  No signs of compartment syndrome.  EDP discussed the case with orthopedic surgery, Dr. Onesimo, who recommended admission and transfer to [facility] for possible orthopedic surgical intervention.   Currently she is undergone a knee external fixator and underwent external fixation 11/7.  Currently awaiting improvement of swelling for likely ORIF.  Hemoglobin has trended down and PT OT recommending no follow-up at this time.  Assessment and Plan:  Closed comminuted fracture of left tibia plateau and Closed comminuted fracture of the left fibular head/neck: Secondary to trauma from fall and noted on x-ray/CT imaging. Complicated by underlying osteoporosis. Orthopedic surgery consulted and performed external fixation of the left leg/tibia and closed reduction of the left tibial plateau fracture; consideration potential ORIF inpatient vs outpatient depending on patient's leg swelling. She has a Spanning knee external fixator in place and underwent this external fixation on 11/7.  Orthopedic will checking soft tissue on 05/02/2024 to get a better gauge on 1 the next surgery will take place.  Recommendation for nonweightbearing and Lovenox  per orthopedic surgery and aggressive ice and elevation for swelling control.  Per orthopedic surgery if the surgery will be more than 10 days out that she may benefit  from getting footplate from OT to help maintain neutral ankle position -Continue pain control with p.o. acetaminophen  650 mg Q6 scheduled, IV ketorolac  Q8 for 5 days, p.o. oxycodone  5 to 10 mg p.o. every 4 as needed for moderate pain and severe pain and IV morphine  1 to 2 mg Q2 as needed for breakthrough pain -Bowel regimen with bisacodyl 10 mg RC daily as needed for moderate constipation, MiraLAX  17 g p.o. twice daily and senna docusate 1 tab p.o. twice daily -PT/OT recommendations: Recommending no follow-up at this time   Fall: Secondary to loss of balance and fall off a Step stool. PT/OT recommendations no follow up   Open left third digit fracture: Secondary to fall. Cefazolin  x3 doses for antibiotic coverage. Orthopedic surgery consulted. Fingers redressed.   Leukocytosis: Fluctuating. Likely reactive and secondary to trauma and fracture. WBC Trend:  Recent Labs  Lab 04/27/24 1833 04/28/24 0438 04/29/24 0334 04/29/24 1847 04/30/24 0601 05/01/24 0448 05/01/24 1240  WBC 17.5* 16.1* 12.8* 16.3* 12.4* 14.2* 14.4*  -No fevers or symptoms for infection. Patient does have an open fracture, however. Antibiotics started and being continued as above.   Generalized Anxiety Disorder: Continue Duloxetine  60 mg po Daily    Hyperlipidemia: Continue Simvastatin  20 mg po daily.   Constipation: See Bowel regimen as above  Hyponatremia: Na+ Trend went from 135 -> 133. CTM and Trend and repeat CMP in the AM.   Osteoporosis: Continue Calcium -Vitamin D  1 tab po Daily w/ Breakfast   Tobacco Use: Smoking Cessation recommended and given. Nicotine  patch 21 mg TD ordered and being continued   Acute Blood Loss Anemia / Normocytic Anemia: Baseline hemoglobin appears to be around 12-13. Hemoglobin of  11.8 on admission with drift downward. Postoperatively, hemoglobin is down to 7.4. -Trend CBC -Transfuse for hemoglobin less thn 7Hgb/Hct Trend:  Recent Labs  Lab 04/27/24 1833 04/28/24 0438  04/29/24 0334 04/29/24 1847 04/30/24 0601 05/01/24 0448 05/01/24 1240  HGB 11.8* 10.4* 8.7* 8.3* 7.4* 7.2* 7.4*  HCT 35.4* 31.2* 26.1* 24.8* 22.2* 21.7* 22.4*  MCV 97.3 96.9 95.6 96.1 96.1 96.9 97.0  -Check Anemia Panel in the AM. CTM For S/Sx of Bleeding; No overt bleeding noted.  Transfuse for hemoglobin less than 7.0 . Repeat CBC in the the AM   DVT prophylaxis: enoxaparin  (LOVENOX ) injection 40 mg Start: 04/30/24 0800 SCDs Start: 04/29/24 1742 SCDs Start: 04/27/24 2015    Code Status: Full Code Family Communication: No family present @ bedside  Disposition Plan:  Level of care: Med-Surg Status is: Inpatient Remains inpatient appropriate because: Needs further clinical improvement and clearance by Orthopedic Surgery   Consultants:  Orthopedic Surgery  Procedures:  Procedures by Dr. Kendal on 04/29/24: CPT 20690-External fixation of left leg/tibia CPT 27532-Closed reduction of left tibial plateau fracture CPT 15852-Dressing change to left hand  Antimicrobials:  Anti-infectives (From admission, onward)    Start     Dose/Rate Route Frequency Ordered Stop   04/29/24 2200  ceFAZolin  (ANCEF ) IVPB 2g/100 mL premix        2 g 200 mL/hr over 30 Minutes Intravenous Every 8 hours 04/29/24 1742 04/30/24 1406   04/29/24 0600  ceFAZolin  (ANCEF ) IVPB 2g/100 mL premix        2 g 200 mL/hr over 30 Minutes Intravenous On call to O.R. 04/28/24 1743 04/29/24 1549   04/27/24 2200  ceFAZolin  (ANCEF ) IVPB 2g/100 mL premix  Status:  Discontinued        2 g 200 mL/hr over 30 Minutes Intravenous Every 8 hours 04/27/24 2059 04/29/24 1742   04/27/24 1900  ceFAZolin  (ANCEF ) IVPB 1 g/50 mL premix  Status:  Discontinued        2 g 200 mL/hr over 30 Minutes Intravenous  Once 04/27/24 1855 04/27/24 2159       Subjective: Seen and examined at bedside and was having some abdominal discomfort and pain.  No nausea or vomiting.  Denying lightheadedness or dizziness.  No other concerns or complaints  at this time.  Objective: Vitals:   04/30/24 1528 04/30/24 1937 05/01/24 0437 05/01/24 0752  BP: 104/70 111/69 94/62 94/70   Pulse: (!) 101 98 90 89  Resp: 18 17 17 19   Temp: 97.9 F (36.6 C) 97.8 F (36.6 C) 98.3 F (36.8 C) 97.9 F (36.6 C)  TempSrc:      SpO2: 100% 99% 100% 100%  Weight:      Height:        Intake/Output Summary (Last 24 hours) at 05/01/2024 1317 Last data filed at 05/01/2024 1015 Gross per 24 hour  Intake 660 ml  Output --  Net 660 ml   Filed Weights   04/28/24 1655  Weight: 57.6 kg   Examination: Physical Exam:  Constitutional: WN/WD older appearing Caucasian female in no acute distress appears calm Respiratory: Diminished to auscultation bilaterally, no wheezing, rales, rhonchi or crackles. Normal respiratory effort and patient is not tachypenic. No accessory muscle use.  Unlabored breathing Cardiovascular: RRR, no murmurs / rubs / gallops. S1 and S2 auscultated. No extremity edema.  Abdomen: Soft, non-tender, non-distended. Bowel sounds positive.  GU: Deferred. Musculoskeletal: Left leg has an external fixator and some leg swelling Skin: No rashes, lesions, ulcers. No induration; Warm and  dry.  Neurologic: CN 2-12 grossly intact with no focal deficits. Romberg sign and cerebellar reflexes not assessed.  Psychiatric: Normal judgment and insight. Alert and oriented x 3. Normal mood and appropriate affect.   Data Reviewed: I have personally reviewed following labs and imaging studies  CBC: Recent Labs  Lab 04/28/24 0438 04/29/24 0334 04/29/24 1847 04/30/24 0601 05/01/24 0448 05/01/24 1240  WBC 16.1* 12.8* 16.3* 12.4* 14.2* 14.4*  NEUTROABS 11.4* 7.5  --  10.0* 8.6* 8.9*  HGB 10.4* 8.7* 8.3* 7.4* 7.2* 7.4*  HCT 31.2* 26.1* 24.8* 22.2* 21.7* 22.4*  MCV 96.9 95.6 96.1 96.1 96.9 97.0  PLT 388 299 281 245 324 329   Basic Metabolic Panel: Recent Labs  Lab 04/27/24 1833 04/28/24 0438 04/29/24 0334 04/29/24 1847 04/30/24 0601  05/01/24 0448  NA 136 135 131*  --  135 133*  K 4.1 4.5 3.9  --  4.0 3.8  CL 100 99 97*  --  98 96*  CO2 27 27 25   --  25 26  GLUCOSE 132* 127* 117*  --  150* 97  BUN 14 14 10   --  7 11  CREATININE 0.70 0.67 0.76 0.81 0.66 0.76  CALCIUM  8.9 8.6* 8.0*  --  8.1* 8.7*  MG  --  1.8  --   --   --   --   PHOS  --  3.2  --   --   --   --    GFR: Estimated Creatinine Clearance: 69 mL/min (by C-G formula based on SCr of 0.76 mg/dL). Liver Function Tests: No results for input(s): AST, ALT, ALKPHOS, BILITOT, PROT, ALBUMIN in the last 168 hours. No results for input(s): LIPASE, AMYLASE in the last 168 hours. No results for input(s): AMMONIA in the last 168 hours. Coagulation Profile: No results for input(s): INR, PROTIME in the last 168 hours. Cardiac Enzymes: No results for input(s): CKTOTAL, CKMB, CKMBINDEX, TROPONINI in the last 168 hours. BNP (last 3 results) No results for input(s): PROBNP in the last 8760 hours. HbA1C: No results for input(s): HGBA1C in the last 72 hours. CBG: No results for input(s): GLUCAP in the last 168 hours. Lipid Profile: No results for input(s): CHOL, HDL, LDLCALC, TRIG, CHOLHDL, LDLDIRECT in the last 72 hours. Thyroid  Function Tests: No results for input(s): TSH, T4TOTAL, FREET4, T3FREE, THYROIDAB in the last 72 hours. Anemia Panel: No results for input(s): VITAMINB12, FOLATE, FERRITIN, TIBC, IRON, RETICCTPCT in the last 72 hours. Sepsis Labs: No results for input(s): PROCALCITON, LATICACIDVEN in the last 168 hours.  Recent Results (from the past 240 hours)  Culture, blood (Routine X 2) w Reflex to ID Panel     Status: None (Preliminary result)   Collection Time: 04/27/24 10:17 PM   Specimen: BLOOD RIGHT FOREARM  Result Value Ref Range Status   Specimen Description BLOOD RIGHT FOREARM  Final   Special Requests   Final    BOTTLES DRAWN AEROBIC AND ANAEROBIC Blood Culture adequate  volume   Culture   Final    NO GROWTH 4 DAYS Performed at [facility], [address] [place], [place] [address]    Report Status PENDING  Incomplete  Culture, blood (Routine X 2) w Reflex to ID Panel     Status: None (Preliminary result)   Collection Time: 04/27/24 10:17 PM   Specimen: BLOOD  Result Value Ref Range Status   Specimen Description BLOOD BLOOD RIGHT HAND  Final   Special Requests   Final    BOTTLES DRAWN  AEROBIC AND ANAEROBIC Blood Culture adequate volume   Culture   Final    NO GROWTH 4 DAYS Performed at [facility], [address] [place], [place] [address]    Report Status PENDING  Incomplete  MRSA Next Gen by PCR, Nasal     Status: Abnormal   Collection Time: 04/28/24  5:52 PM   Specimen: Nasal Mucosa; Nasal Swab  Result Value Ref Range Status   MRSA by PCR Next Gen DETECTED (A) NOT DETECTED Final    Comment: CRITICAL RESULTS CALLED TO, READ BACK BY AND VERIFIED WITH : RN M.SHIESTHA ON 04/29/24 AT 0042 BY NM (NOTE) The GeneXpert MRSA Assay (FDA approved for NASAL specimens only), is one component of a comprehensive MRSA colonization surveillance program. It is not intended to diagnose MRSA infection nor to guide or monitor treatment for MRSA infections. Test performance is not FDA approved in patients less than 32 years old. Performed at [facility], [address], [place], [place] [address]     Radiology Studies: DG Knee Left Port Result Date: 04/29/2024 EXAM: 1 OR 2 VIEW(S) XRAY OF THE KNEE 04/29/2024 04:41:00 PM COMPARISON: Intraoperative films from earlier in the same day. CLINICAL HISTORY: Fracture [id number] FINDINGS: BONES AND JOINTS: External fixation device noted. Internal fixation of patella with pins. Comminuted tibial plateau fracture. Comminuted fracture of proximal fibula. Joint effusion with fat fluid level. SOFT TISSUES: The soft tissues are unremarkable. IMPRESSION: 1. Comminuted tibial plateau fracture and comminuted proximal fibular  fracture. 2. Lipohemarthrosis. 3. External fixation device and patellar internal fixation hardware present. Electronically signed by: Oneil Devonshire MD 04/29/2024 11:07 PM EST RP Workstation: [id number]   DG Knee 1-2 Views Left Result Date: 04/29/2024 EXAM: FLUOROSCOPIC IMAGES, 8 VIEWS TECHNIQUE: Fluoroscopy was provided by the radiology department for procedure. Radiologist was not present during examination. FLUOROSCOPY DOSE AND TYPE: Reference Air Kerma (in mGy) = 0.4 Fluoro time: 12 secs COMPARISON: Comparison study 04/27/2024. CLINICAL HISTORY: 461500 Elective surgery 461500 Elective surgery. FINDINGS: Intraoperative fluoroscopic imaging was performed. Prior patellar fixation is noted. Screws were extended into the midshaft of the tibia and mid to distal femur for placement of external fixator. The fracture fragments were reduced following placement of the external fixator. IMPRESSION: 1. Intraoperative fluoroscopic spot images documenting external fixator screw placement across the distal femur and proximal tibia with interval fracture fragment reduction. NOTE: Intraoperative fluoroscopic spot images as above. Please refer to the intraoperative report for full details. Electronically signed by: Oneil Devonshire MD 04/29/2024 11:06 PM EST RP Workstation: [id number] C-Arm 1-60 Min-No Report Result Date: 04/29/2024 Fluoroscopy was utilized by the requesting physician.  No radiographic interpretation.   Scheduled Meds:  acetaminophen   650 mg Oral Q6H   calcium -vitamin D   1 tablet Oral Q breakfast   Chlorhexidine  Gluconate Cloth  6 each Topical Q0600   DULoxetine   60 mg Oral Daily   enoxaparin  (LOVENOX ) injection  40 mg Subcutaneous Q24H   ketorolac   15 mg Intravenous Q8H   loratadine   10 mg Oral Daily   mupirocin  ointment   Nasal BID   nicotine   21 mg Transdermal Daily   pneumococcal 20-valent conjugate vaccine  0.5 mL Intramuscular Tomorrow-1000   polyethylene glycol  17 g Oral BID    senna-docusate  1 tablet Oral BID   simvastatin   20 mg Oral q1800   Continuous Infusions:   LOS: 4 days   Alejandro Marker, DO [facility] Available via Epic secure chat 7am-7pm After these hours, please refer  to coverage provider listed on [email or web address] 05/01/2024, 1:17 PM

## 2024-05-01 NOTE — Progress Notes (Signed)
 Orthopaedic Trauma Service Progress Note  Patient ID: [name] MRN: [id number] DOB/AGE: Oct 23, 1965 58 y.o.  Subjective:  No complaints  Ambulated with therapy yesterday   No other complaints   ROS As above  Today's  total administered Morphine  Milligram Equivalents: 0 Yesterday's total administered Morphine  Milligram Equivalents: 48    Objective:   VITALS:   Vitals:   04/30/24 1528 04/30/24 1937 05/01/24 0437 05/01/24 0752  BP: 104/70 111/69 94/62 94/70   Pulse: (!) 101 98 90 89  Resp: 18 17 17 19   Temp: 97.9 F (36.6 C) 97.8 F (36.6 C) 98.3 F (36.8 C) 97.9 F (36.6 C)  TempSrc:      SpO2: 100% 99% 100% 100%  Weight:      Height:        Estimated body mass index is 21.13 kg/m as calculated from the following:   Height as of this encounter: 5' 5 (1.651 m).   Weight as of this encounter: 57.6 kg.   Intake/Output      11/08 0701 11/09 0700 11/09 0701 11/10 0700   P.O. 600    I.V. (mL/kg)     IV Piggyback 300    Total Intake(mL/kg) 900 (15.6)    Urine (mL/kg/hr) 500 (0.4)    Stool     Blood     Total Output 500    Net +400         Urine Occurrence 5 x      LABS  Results for orders placed or performed during the hospital encounter of 04/27/24 (from the past 24 hours)  CBC with Differential/Platelet     Status: Abnormal   Collection Time: 05/01/24  4:48 AM  Result Value Ref Range   WBC 14.2 (H) 4.0 - 10.5 K/uL   RBC 2.24 (L) 3.87 - 5.11 MIL/uL   Hemoglobin 7.2 (L) 12.0 - 15.0 g/dL   HCT 78.2 (L) 63.9 - 53.9 %   MCV 96.9 80.0 - 100.0 fL   MCH 32.1 26.0 - 34.0 pg   MCHC 33.2 30.0 - 36.0 g/dL   RDW 86.7 88.4 - 84.4 %   Platelets 324 150 - 400 K/uL   nRBC 0.0 0.0 - 0.2 %   Neutrophils Relative % 60 %   Neutro Abs 8.6 (H) 1.7 - 7.7 K/uL   Lymphocytes Relative 22 %   Lymphs Abs 3.1 0.7 - 4.0 K/uL   Monocytes Relative 15 %   Monocytes Absolute 2.1 (H) 0.1 - 1.0 K/uL    Eosinophils Relative 2 %   Eosinophils Absolute 0.3 0.0 - 0.5 K/uL   Basophils Relative 0 %   Basophils Absolute 0.0 0.0 - 0.1 K/uL   Immature Granulocytes 1 %   Abs Immature Granulocytes 0.08 (H) 0.00 - 0.07 K/uL  Basic metabolic panel with GFR     Status: Abnormal   Collection Time: 05/01/24  4:48 AM  Result Value Ref Range   Sodium 133 (L) 135 - 145 mmol/L   Potassium 3.8 3.5 - 5.1 mmol/L   Chloride 96 (L) 98 - 111 mmol/L   CO2 26 22 - 32 mmol/L   Glucose, Bld 97 70 - 99 mg/dL   BUN 11 6 - 20 mg/dL   Creatinine, Ser 9.23 0.44 - 1.00 mg/dL   Calcium  8.7 (L) 8.9 - 10.3 mg/dL  GFR, Estimated >60 >60 mL/min   Anion gap 11 5 - 15     PHYSICAL EXAM:   Gen: sitting up in bed, pleasant, NAD Lungs: unlabored Cardiac: reg Ext:       Left Lower Extremity Spanning knee external fixator is in place                   Pinsite dressings to tibial pins have been changed  No active drainage noted  Mostly serosanguinous drainage  Still with moderate swelling present              Extremity is warm             No DCT             Compartments are soft             No pain out of proportion with passive stretching of toes or ankle             DPN, SPN, TN sensory functions are intact             EHL, FHL, lesser toe motor functions intact             Ankle flexion, extension, inversion eversion intact             + DP pulse  Assessment/Plan: 2 Days Post-Op   Principal Problem:   Closed fracture of left tibial plateau, initial encounter   Anti-infectives (From admission, onward)    Start     Dose/Rate Route Frequency Ordered Stop   04/29/24 2200  ceFAZolin  (ANCEF ) IVPB 2g/100 mL premix        2 g 200 mL/hr over 30 Minutes Intravenous Every 8 hours 04/29/24 1742 04/30/24 1406   04/29/24 0600  ceFAZolin  (ANCEF ) IVPB 2g/100 mL premix        2 g 200 mL/hr over 30 Minutes Intravenous On call to O.R. 04/28/24 1743 04/29/24 1549   04/27/24 2200  ceFAZolin  (ANCEF ) IVPB 2g/100 mL  premix  Status:  Discontinued        2 g 200 mL/hr over 30 Minutes Intravenous Every 8 hours 04/27/24 2059 04/29/24 1742   04/27/24 1900  ceFAZolin  (ANCEF ) IVPB 1 g/50 mL premix  Status:  Discontinued        2 g 200 mL/hr over 30 Minutes Intravenous  Once 04/27/24 1855 04/27/24 2159     .  POD/HD#: 37  58 year old female fall off stepladder with left bicondylar tibial plateau fracture and left hand wounds   - Fall off ladder   -Left bicondylar tibial plateau fracture             Patient had fracture blisters and significant swelling that precluded ORIF.  She underwent external fixation on 04/29/2024             Nonweightbearing left leg for now                          Recheck soft tissue on tomorrow to get a better gauge on when the next surgery will take place               Aggressive ice and elevation for swelling control                          Move toes is much as possible as well as her ankle to help with swelling control as well  If surgery is going to be more than 10 days out she may benefit from getting a foot plate from OT to help maintain neutral ankle position   -Left hand wounds             Local care   - Pain management:             Multimodal   - ABL anemia/Hemodynamics             Monitor             Agree with transfusing with hemoglobin less than 7     - Medical issues              Per primary   - DVT/PE prophylaxis:             Lovenox    - ID:              Perioperative antibiotics completed    - Metabolic Bone Disease:             Vitamin D  levels look reasonable   - Activity:             Activity as tolerated while maintaining nonweightbearing on left leg.  Okay to work with therapies   - FEN/GI prophylaxis/Foley/Lines:             Regular diet   -Ex-fix/Splint care:             Okay to manipulate leg by fixator             Orders for pin care has been placed   - Impediments to fracture healing:             History of  osteoporosis   - Dispo:             Current ortho issues with been stabilized             Reevaluate soft tissue on Monday             Continue with inpatient care             [facility] consult for SNF unless if she is able to mobilize adequately and has appropriate help at home     Francis MICAEL Mt, PA-C [phone] (C) 05/01/2024, 10:06 AM  [facility] [address] [place] [address] [phone] (F)    After 5pm and on the weekends please log on to Amion, go to orthopaedics and the look under the [facility] Call for the provider(s) on call. You can also call our office at [phone] and then follow the prompts to be connected to the call team.  Patient ID: [name], female   DOB: October 13, 1965, 58 y.o.   MRN: [id number]

## 2024-05-01 NOTE — Plan of Care (Signed)

## 2024-05-01 NOTE — Progress Notes (Signed)
 Mobility Specialist Progress Note:    05/01/24 1251  Mobility  Activity Ambulated with assistance  Level of Assistance Contact guard assist, steadying assist  Assistive Device Front wheel walker  Distance Ambulated (ft) 20 ft (x3)  LLE Weight Bearing Per Provider Order NWB  Activity Response Tolerated well  Mobility Referral Yes  Mobility visit 1 Mobility  Mobility Specialist Start Time (ACUTE ONLY) 1140  Mobility Specialist Stop Time (ACUTE ONLY) 1156  Mobility Specialist Time Calculation (min) (ACUTE ONLY) 16 min   Pt received in bed eager for mobility. No c/o throughout. No physical assistance required, contact guard for safety. Pt was able to maintain NWB precautions w/o fault. Requested to be wheeled back to room in recliner d/t fatigue. Left in chair w/ call bell and personal belongings in reach. All needs met. RN aware.  Thersia Minder Mobility Specialist  Please contact vis Secure Chat or  Rehab Office [phone]

## 2024-05-02 ENCOUNTER — Inpatient Hospital Stay (HOSPITAL_COMMUNITY): Admitting: Anesthesiology

## 2024-05-02 ENCOUNTER — Inpatient Hospital Stay (HOSPITAL_COMMUNITY)

## 2024-05-02 ENCOUNTER — Encounter (HOSPITAL_COMMUNITY)
Admission: EM | Disposition: A | Discharge status: Skilled Nursing Facility | Payer: Self-pay | Source: Home / Self Care | Attending: Internal Medicine

## 2024-05-02 ENCOUNTER — Encounter (HOSPITAL_COMMUNITY): Payer: Self-pay | Admitting: Student

## 2024-05-02 DIAGNOSIS — I10 Essential (primary) hypertension: Secondary | ICD-10-CM

## 2024-05-02 DIAGNOSIS — F411 Generalized anxiety disorder: Secondary | ICD-10-CM

## 2024-05-02 DIAGNOSIS — Y92009 Unspecified place in unspecified non-institutional (private) residence as the place of occurrence of the external cause: Secondary | ICD-10-CM

## 2024-05-02 DIAGNOSIS — D509 Iron deficiency anemia, unspecified: Secondary | ICD-10-CM

## 2024-05-02 DIAGNOSIS — M818 Other osteoporosis without current pathological fracture: Secondary | ICD-10-CM

## 2024-05-02 DIAGNOSIS — Z87891 Personal history of nicotine dependence: Secondary | ICD-10-CM

## 2024-05-02 DIAGNOSIS — S82142A Displaced bicondylar fracture of left tibia, initial encounter for closed fracture: Secondary | ICD-10-CM

## 2024-05-02 DIAGNOSIS — Z720 Tobacco use: Secondary | ICD-10-CM

## 2024-05-02 DIAGNOSIS — W19XXXA Unspecified fall, initial encounter: Secondary | ICD-10-CM

## 2024-05-02 DIAGNOSIS — K5900 Constipation, unspecified: Secondary | ICD-10-CM

## 2024-05-02 HISTORY — PX: ORIF TIBIA PLATEAU: SHX2132

## 2024-05-02 HISTORY — PX: EXTERNAL FIXATION REMOVAL: SHX5040

## 2024-05-02 LAB — CBC WITH DIFFERENTIAL/PLATELET
Abs Immature Granulocytes: 0.15 K/uL — ABNORMAL HIGH (ref 0.00–0.07)
Basophils Absolute: 0 K/uL (ref 0.0–0.1)
Basophils Relative: 0 %
Eosinophils Absolute: 0.4 K/uL (ref 0.0–0.5)
Eosinophils Relative: 3 %
HCT: 22.4 % — ABNORMAL LOW (ref 36.0–46.0)
Hemoglobin: 7.4 g/dL — ABNORMAL LOW (ref 12.0–15.0)
Immature Granulocytes: 1 %
Lymphocytes Relative: 13 %
Lymphs Abs: 2 K/uL (ref 0.7–4.0)
MCH: 31.9 pg (ref 26.0–34.0)
MCHC: 33 g/dL (ref 30.0–36.0)
MCV: 96.6 fL (ref 80.0–100.0)
Monocytes Absolute: 2.4 K/uL — ABNORMAL HIGH (ref 0.1–1.0)
Monocytes Relative: 16 %
Neutro Abs: 10.2 K/uL — ABNORMAL HIGH (ref 1.7–7.7)
Neutrophils Relative %: 67 %
Platelets: 353 K/uL (ref 150–400)
RBC: 2.32 MIL/uL — ABNORMAL LOW (ref 3.87–5.11)
RDW: 13.4 % (ref 11.5–15.5)
WBC: 15.2 K/uL — ABNORMAL HIGH (ref 4.0–10.5)
nRBC: 0 % (ref 0.0–0.2)

## 2024-05-02 LAB — ABO/RH: ABO/RH(D): O POS

## 2024-05-02 LAB — COMPREHENSIVE METABOLIC PANEL WITH GFR
ALT: 12 U/L (ref 0–44)
AST: 21 U/L (ref 15–41)
Albumin: 2.4 g/dL — ABNORMAL LOW (ref 3.5–5.0)
Alkaline Phosphatase: 42 U/L (ref 38–126)
Anion gap: 8 (ref 5–15)
BUN: 12 mg/dL (ref 6–20)
CO2: 26 mmol/L (ref 22–32)
Calcium: 8.1 mg/dL — ABNORMAL LOW (ref 8.9–10.3)
Chloride: 100 mmol/L (ref 98–111)
Creatinine, Ser: 0.7 mg/dL (ref 0.44–1.00)
GFR, Estimated: >60 mL/min (ref >60)
Glucose, Bld: 90 mg/dL (ref 70–99)
Potassium: 4.1 mmol/L (ref 3.5–5.1)
Sodium: 134 mmol/L — ABNORMAL LOW (ref 135–145)
Total Bilirubin: 0.6 mg/dL (ref 0.0–1.2)
Total Protein: 5.2 g/dL — ABNORMAL LOW (ref 6.5–8.1)

## 2024-05-02 LAB — CULTURE, BLOOD (ROUTINE X 2)
Culture: NO GROWTH
Culture: NO GROWTH
Special Requests: ADEQUATE
Special Requests: ADEQUATE

## 2024-05-02 LAB — HEMOGLOBIN A1C
Hgb A1c MFr Bld: 5.1 % (ref 4.8–5.6)
Mean Plasma Glucose: 99.67 mg/dL

## 2024-05-02 LAB — PHOSPHORUS: Phosphorus: 2.7 mg/dL (ref 2.5–4.6)

## 2024-05-02 LAB — MAGNESIUM: Magnesium: 1.7 mg/dL (ref 1.7–2.4)

## 2024-05-02 SURGERY — OPEN REDUCTION INTERNAL FIXATION (ORIF) TIBIAL PLATEAU
Anesthesia: General | Site: Leg Lower | Laterality: Left

## 2024-05-02 MED ORDER — PROPOFOL 10 MG/ML IV BOLUS
INTRAVENOUS | Status: DC | PRN
Start: 2024-05-02 — End: 2024-05-02
  Administered 2024-05-02: 100 mg via INTRAVENOUS
  Administered 2024-05-02: 30 mg via INTRAVENOUS
  Administered 2024-05-02: 100 mg via INTRAVENOUS

## 2024-05-02 MED ORDER — MIDAZOLAM HCL 2 MG/2ML IJ SOLN
INTRAMUSCULAR | Status: AC
  Filled 2024-05-02: qty 2

## 2024-05-02 MED ORDER — SUGAMMADEX SODIUM 200 MG/2ML IV SOLN
INTRAVENOUS | Status: DC | PRN
Start: 2024-05-02 — End: 2024-05-02
  Administered 2024-05-02: 200 mg via INTRAVENOUS

## 2024-05-02 MED ORDER — EPHEDRINE SULFATE-NACL 50-0.9 MG/10ML-% IV SOSY
PREFILLED_SYRINGE | INTRAVENOUS | Status: DC | PRN
Start: 2024-05-02 — End: 2024-05-02
  Administered 2024-05-02: 10 mg via INTRAVENOUS

## 2024-05-02 MED ORDER — OXYCODONE HCL 5 MG PO TABS
ORAL_TABLET | ORAL | Status: AC
  Filled 2024-05-02: qty 2

## 2024-05-02 MED ORDER — LIDOCAINE 2% (20 MG/ML) 5 ML SYRINGE
INTRAMUSCULAR | Status: AC
  Filled 2024-05-02: qty 5

## 2024-05-02 MED ORDER — CEFAZOLIN SODIUM-DEXTROSE 2-4 GM/100ML-% IV SOLN
2.0000 g | Freq: Three times a day (TID) | INTRAVENOUS | Status: AC
  Administered 2024-05-02 – 2024-05-03 (×3): 2 g via INTRAVENOUS
  Filled 2024-05-02 (×3): qty 100

## 2024-05-02 MED ORDER — LACTATED RINGERS IV SOLN: INTRAVENOUS | Status: DC

## 2024-05-02 MED ORDER — PHENYLEPHRINE 80 MCG/ML (10ML) SYRINGE FOR IV PUSH (FOR BLOOD PRESSURE SUPPORT)
PREFILLED_SYRINGE | INTRAVENOUS | Status: DC | PRN
Start: 2024-05-02 — End: 2024-05-02
  Administered 2024-05-02: 120 ug via INTRAVENOUS

## 2024-05-02 MED ORDER — HYDROMORPHONE HCL 1 MG/ML IJ SOLN
INTRAMUSCULAR | Status: AC
  Filled 2024-05-02: qty 0.5

## 2024-05-02 MED ORDER — PROPOFOL 10 MG/ML IV BOLUS
INTRAVENOUS | Status: AC
  Filled 2024-05-02: qty 20

## 2024-05-02 MED ORDER — SODIUM CHLORIDE 0.9% IV SOLUTION: Freq: Once | INTRAVENOUS | Status: DC

## 2024-05-02 MED ORDER — SODIUM CHLORIDE 0.9 % IV SOLN: INTRAVENOUS | Status: AC

## 2024-05-02 MED ORDER — ORAL CARE MOUTH RINSE: 15.0000 mL | Freq: Once | OROMUCOSAL | Status: AC

## 2024-05-02 MED ORDER — ROCURONIUM BROMIDE 10 MG/ML (PF) SYRINGE
PREFILLED_SYRINGE | INTRAVENOUS | Status: DC | PRN
  Administered 2024-05-02: 10 mg via INTRAVENOUS
  Administered 2024-05-02: 50 mg via INTRAVENOUS

## 2024-05-02 MED ORDER — ONDANSETRON HCL 4 MG/2ML IJ SOLN
INTRAMUSCULAR | Status: AC
  Filled 2024-05-02: qty 2

## 2024-05-02 MED ORDER — FENTANYL CITRATE (PF) 250 MCG/5ML IJ SOLN
INTRAMUSCULAR | Status: DC | PRN
  Administered 2024-05-02: 100 ug via INTRAVENOUS

## 2024-05-02 MED ORDER — OXYCODONE HCL 5 MG/5ML PO SOLN: 5.0000 mg | Freq: Once | ORAL | Status: DC | PRN

## 2024-05-02 MED ORDER — POLYETHYLENE GLYCOL 3350 17 G PO PACK: 17.0000 g | PACK | Freq: Every day | ORAL | Status: DC | PRN

## 2024-05-02 MED ORDER — VANCOMYCIN HCL 1000 MG IV SOLR
INTRAVENOUS | Status: DC | PRN
  Administered 2024-05-02: 1000 mg via TOPICAL

## 2024-05-02 MED ORDER — LIDOCAINE 2% (20 MG/ML) 5 ML SYRINGE
INTRAMUSCULAR | Status: DC | PRN
  Administered 2024-05-02: 80 mg via INTRAVENOUS

## 2024-05-02 MED ORDER — CHLORHEXIDINE GLUCONATE 0.12 % MT SOLN
15.0000 mL | Freq: Once | OROMUCOSAL | Status: AC
  Administered 2024-05-02: 15 mL via OROMUCOSAL

## 2024-05-02 MED ORDER — VANCOMYCIN HCL 1000 MG IV SOLR
INTRAVENOUS | Status: AC
  Filled 2024-05-02: qty 20

## 2024-05-02 MED ORDER — FENTANYL CITRATE (PF) 100 MCG/2ML IJ SOLN
INTRAMUSCULAR | Status: AC
  Filled 2024-05-02: qty 2

## 2024-05-02 MED ORDER — OXYCODONE HCL 5 MG PO TABS: 5.0000 mg | ORAL_TABLET | Freq: Once | ORAL | Status: DC | PRN

## 2024-05-02 MED ORDER — ALBUTEROL SULFATE HFA 108 (90 BASE) MCG/ACT IN AERS
INHALATION_SPRAY | RESPIRATORY_TRACT | Status: AC
  Filled 2024-05-02: qty 6.7

## 2024-05-02 MED ORDER — MAGNESIUM SULFATE 2 GM/50ML IV SOLN
2.0000 g | Freq: Once | INTRAVENOUS | Status: AC
  Administered 2024-05-02: 2 g via INTRAVENOUS
  Filled 2024-05-02: qty 50

## 2024-05-02 MED ORDER — CEFAZOLIN SODIUM-DEXTROSE 2-3 GM-%(50ML) IV SOLR
INTRAVENOUS | Status: DC | PRN
  Administered 2024-05-02: 2 g via INTRAVENOUS

## 2024-05-02 MED ORDER — MIDAZOLAM HCL (PF) 2 MG/2ML IJ SOLN
INTRAMUSCULAR | Status: DC | PRN
Start: 2024-05-02 — End: 2024-05-02
  Administered 2024-05-02: 2 mg via INTRAVENOUS

## 2024-05-02 MED ORDER — DEXAMETHASONE SOD PHOSPHATE PF 10 MG/ML IJ SOLN
INTRAMUSCULAR | Status: DC | PRN
Start: 2024-05-02 — End: 2024-05-02
  Administered 2024-05-02: 5 mg via INTRAVENOUS

## 2024-05-02 MED ORDER — AMISULPRIDE (ANTIEMETIC) 5 MG/2ML IV SOLN
10.0000 mg | Freq: Once | INTRAVENOUS | Status: DC | PRN
Start: 2024-05-02 — End: 2024-05-02

## 2024-05-02 MED ORDER — DEXMEDETOMIDINE HCL IN NACL 80 MCG/20ML IV SOLN
INTRAVENOUS | Status: DC | PRN
  Administered 2024-05-02: 8 ug via INTRAVENOUS
  Administered 2024-05-02: 4 ug via INTRAVENOUS
  Administered 2024-05-02: 8 ug via INTRAVENOUS

## 2024-05-02 MED ORDER — HYDROMORPHONE HCL 1 MG/ML IJ SOLN
INTRAMUSCULAR | Status: AC
  Filled 2024-05-02: qty 1

## 2024-05-02 MED ORDER — ROCURONIUM BROMIDE 10 MG/ML (PF) SYRINGE
PREFILLED_SYRINGE | INTRAVENOUS | Status: AC
  Filled 2024-05-02: qty 10

## 2024-05-02 MED ORDER — HYDROMORPHONE HCL 1 MG/ML IJ SOLN
0.2500 mg | INTRAMUSCULAR | Status: DC | PRN
  Administered 2024-05-02 (×2): 0.5 mg via INTRAVENOUS

## 2024-05-02 MED ORDER — HYDROMORPHONE HCL 1 MG/ML IJ SOLN
INTRAMUSCULAR | Status: DC | PRN
  Administered 2024-05-02: .5 mg via INTRAVENOUS

## 2024-05-02 MED ORDER — 0.9 % SODIUM CHLORIDE (POUR BTL) OPTIME
TOPICAL | Status: DC | PRN
  Administered 2024-05-02: 1000 mL

## 2024-05-02 MED ORDER — ONDANSETRON HCL 4 MG/2ML IJ SOLN
INTRAMUSCULAR | Status: DC | PRN
Start: 2024-05-02 — End: 2024-05-02
  Administered 2024-05-02: 4 mg via INTRAVENOUS

## 2024-05-02 SURGICAL SUPPLY — 64 items
BIT DRILL CALIBR QC 2.8X250 (BIT) IMPLANT
BIT DRILL QC SFS 2.5X170 (BIT) IMPLANT
BLADE SURG 15 STRL LF DISP TIS (BLADE) ×1 IMPLANT
BNDG COMPR ESMARK 6X3 LF (GAUZE/BANDAGES/DRESSINGS) ×1 IMPLANT
BNDG ELASTIC 4INX 5YD STR LF (GAUZE/BANDAGES/DRESSINGS) IMPLANT
BNDG ELASTIC 4X5.8 VLCR STR LF (GAUZE/BANDAGES/DRESSINGS) ×1 IMPLANT
BNDG ELASTIC 6INX 5YD STR LF (GAUZE/BANDAGES/DRESSINGS) ×1 IMPLANT
BNDG GAUZE DERMACEA FLUFF 4 (GAUZE/BANDAGES/DRESSINGS) ×2 IMPLANT
BRUSH SCRUB EZ PLAIN DRY (MISCELLANEOUS) ×2 IMPLANT
CANISTER SUCTION 3000ML PPV (SUCTIONS) ×1 IMPLANT
CHLORAPREP W/TINT 26 (MISCELLANEOUS) ×2 IMPLANT
COVER SURGICAL LIGHT HANDLE (MISCELLANEOUS) ×2 IMPLANT
CUFF TRNQT CYL 24X4X16.5-23 (TOURNIQUET CUFF) IMPLANT
DRAPE C-ARM 42X72 X-RAY (DRAPES) ×1 IMPLANT
DRAPE C-ARMOR (DRAPES) ×1 IMPLANT
DRAPE SURG ORHT 6 SPLT 77X108 (DRAPES) ×2 IMPLANT
DRAPE U-SHAPE 47X51 STRL (DRAPES) ×1 IMPLANT
DRESSING MEPILEX FLEX 4X4 (GAUZE/BANDAGES/DRESSINGS) IMPLANT
DRSG ADAPTIC 3X8 NADH LF (GAUZE/BANDAGES/DRESSINGS) ×1 IMPLANT
DRSG MEPITEL 4X7.2 (GAUZE/BANDAGES/DRESSINGS) IMPLANT
ELECTRODE REM PT RTRN 9FT ADLT (ELECTROSURGICAL) ×1 IMPLANT
GAUZE PAD ABD 8X10 STRL (GAUZE/BANDAGES/DRESSINGS) ×2 IMPLANT
GAUZE SPONGE 4X4 12PLY STRL (GAUZE/BANDAGES/DRESSINGS) ×1 IMPLANT
GLOVE BIO SURGEON STRL SZ 6.5 (GLOVE) ×3 IMPLANT
GLOVE BIO SURGEON STRL SZ7.5 (GLOVE) ×4 IMPLANT
GLOVE BIOGEL PI IND STRL 6.5 (GLOVE) ×1 IMPLANT
GLOVE BIOGEL PI IND STRL 7.5 (GLOVE) ×1 IMPLANT
GOWN STRL REUS W/ TWL LRG LVL3 (GOWN DISPOSABLE) ×2 IMPLANT
IMMOBILIZER KNEE 22 UNIV (SOFTGOODS) ×1 IMPLANT
KIT BASIN OR (CUSTOM PROCEDURE TRAY) ×1 IMPLANT
KIT TURNOVER KIT B (KITS) ×1 IMPLANT
KWIRE 1.6X150 (WIRE) IMPLANT
MANIFOLD NEPTUNE II (INSTRUMENTS) ×1 IMPLANT
NDL SUT 6 .5 CRC .975X.05 MAYO (NEEDLE) ×1 IMPLANT
NDL TAPERED W/ NITINOL LOOP (MISCELLANEOUS) ×1 IMPLANT
NEEDLE TAPERED W/ NITINOL LOOP (MISCELLANEOUS) ×1 IMPLANT
PACK TOTAL JOINT (CUSTOM PROCEDURE TRAY) ×1 IMPLANT
PAD ARMBOARD POSITIONER FOAM (MISCELLANEOUS) ×2 IMPLANT
PAD CAST 4YDX4 CTTN HI CHSV (CAST SUPPLIES) ×1 IMPLANT
PADDING CAST COTTON 6X4 STRL (CAST SUPPLIES) ×3 IMPLANT
PLATE PROX TIBIA LEFT 8H (Plate) IMPLANT
SCREW LOCK CORT ST 3.5X26 (Screw) IMPLANT
SCREW LOCK CORT ST 3.5X28 (Screw) IMPLANT
SCREW LOCK CORT ST 3.5X30 (Screw) IMPLANT
SCREW LOCKING 3.5X80MM VA (Screw) IMPLANT
SCREW LOCKING VA 3.5X75MM (Screw) IMPLANT
SOLN 0.9% NACL POUR BTL 1000ML (IV SOLUTION) ×1 IMPLANT
SOLN STERILE WATER BTL 1000 ML (IV SOLUTION) ×2 IMPLANT
SPONGE T-LAP 18X18 ~~LOC~~+RFID (SPONGE) ×1 IMPLANT
STAPLER SKIN PROX 35W (STAPLE) ×1 IMPLANT
SUCTION TUBE FRAZIER 10FR DISP (SUCTIONS) ×1 IMPLANT
SUT ETHILON 2 0 FS 18 (SUTURE) ×1 IMPLANT
SUT ETHILON 3 0 PS 1 (SUTURE) IMPLANT
SUT MNCRL AB 3-0 PS2 18 (SUTURE) ×1 IMPLANT
SUT MON AB 2-0 CT1 36 (SUTURE) ×1 IMPLANT
SUT NYLON 3 0 (SUTURE) IMPLANT
SUT VIC AB 0 CT1 27XBRD ANBCTR (SUTURE) IMPLANT
SUT VIC AB 1 CT1 18XCR BRD 8 (SUTURE) IMPLANT
SUT VIC AB 1 CT1 27XBRD ANBCTR (SUTURE) ×1 IMPLANT
SUT VIC AB 2-0 CT1 TAPERPNT 27 (SUTURE) ×2 IMPLANT
SUTURE FIBERWR #2 38 T-5 BLUE (SUTURE) IMPLANT
TOWEL GREEN STERILE (TOWEL DISPOSABLE) ×2 IMPLANT
TOWEL GREEN STERILE FF (TOWEL DISPOSABLE) ×2 IMPLANT
UNDERPAD 30X36 HEAVY ABSORB (UNDERPADS AND DIAPERS) ×1 IMPLANT

## 2024-05-02 NOTE — Transfer of Care (Signed)
 Immediate Anesthesia Transfer of Care Note  Patient: [name]  Procedure(s) Performed: OPEN REDUCTION INTERNAL FIXATION (ORIF) TIBIAL PLATEAU (Left: Leg Lower) REMOVAL, EXTERNAL FIXATION DEVICE, LOWER EXTREMITY (Left: Leg Lower)  Patient Location: PACU  Anesthesia Type:General  Level of Consciousness: awake and drowsy  Airway & Oxygen Therapy: Patient Spontanous Breathing  Post-op Assessment: Report given to RN  Post vital signs: Reviewed and stable  Last Vitals:  Vitals Value Taken Time  BP 106/72 05/02/24 11:07  Temp 37.1 C 05/02/24 11:07  Pulse 105 05/02/24 11:13  Resp 14 05/02/24 11:13  SpO2 95 % 05/02/24 11:13  Vitals shown include unfiled device data.  Last Pain:  Vitals:   05/02/24 0803  TempSrc: Oral  PainSc: 10-Worst pain ever      Patients Stated Pain Goal: 3 (04/30/24 0834)  Complications: No notable events documented.

## 2024-05-02 NOTE — Interval H&P Note (Signed)
 History and Physical Interval Note:  05/02/2024 8:38 AM  [name]  has presented today for surgery, with the diagnosis of Left tibial plateau fracture.  The various methods of treatment have been discussed with the patient and family. After consideration of risks, benefits and other options for treatment, the patient has consented to  Procedure(s): OPEN REDUCTION INTERNAL FIXATION (ORIF) TIBIAL PLATEAU (Left) REMOVAL, EXTERNAL FIXATION DEVICE, LOWER EXTREMITY (Left) as a surgical intervention.  The patient's history has been reviewed, patient examined, no change in status, stable for surgery.  I have reviewed the patient's chart and labs.  Questions were answered to the patient's satisfaction.     [name] P Amando Ishikawa

## 2024-05-02 NOTE — Progress Notes (Signed)
 Ortho Trauma Note  Patient checked this morning swelling is improved around the potential surgical incision about her knee.  We will plan to proceed with open reduction internal fixation today.  Risks and benefits were discussed with the patient.  She agrees to proceed with surgery.  Will plan continue nonweightbearing precautions to the left lower extremity postoperatively continue to remain n.p.o. for now for surgery later today.  Franky MYRTIS Light, MD [facility] [phone] (office) [email or web address]

## 2024-05-02 NOTE — Progress Notes (Signed)
 PT Cancellation Note  Patient Details Name: [name] MRN: [id number] DOB: [birth year]   Cancelled Treatment:    Reason Eval/Treat Not Completed: Patient at procedure or test/unavailable (surgery today). Will watch for post op orders and f/u tomorrow.   [name], PT  Acute Rehab Services Secure chat preferred Office [phone]    [name] Lipoma 05/02/2024, 8:38 AM

## 2024-05-02 NOTE — Anesthesia Procedure Notes (Signed)
 Procedure Name: Intubation Date/Time: 05/02/2024 9:34 AM  Performed by: Delores Duwaine SAUNDERS, CRNAPre-anesthesia Checklist: Patient identified, Emergency Drugs available, Suction available and Patient being monitored Patient Re-evaluated:Patient Re-evaluated prior to induction Oxygen Delivery Method: Circle System Utilized Preoxygenation: Pre-oxygenation with 100% oxygen Induction Type: IV induction Ventilation: Mask ventilation without difficulty Laryngoscope Size: Mac and 3 Grade View: Grade I Tube type: Oral Tube size: 7.0 mm Number of attempts: 1 Airway Equipment and Method: Stylet and Oral airway Placement Confirmation: ETT inserted through vocal cords under direct vision, positive ETCO2 and breath sounds checked- equal and bilateral Secured at: 21 cm Tube secured with: Tape Dental Injury: Teeth and Oropharynx as per pre-operative assessment

## 2024-05-02 NOTE — Progress Notes (Signed)
 PROGRESS NOTE    [name]  FMW:[id number] DOB: [birth year] DOA: 04/27/2024 PCP: [name], [name]   Brief Narrative:  The patient is 58 y.o. female with medical history significant for hyperlipidemia, chronic anxiety, osteoporosis, current tobacco smoker, who presents to the ER via EMS from home after a mechanical fall. The patient was on a stepstool, taking down her halloween decorations when she lost her balance and fell.    In the ER, she was noted to have deformity and laceration to her left middle finger with fracture.  Imaging revealed comminuted proximal tibia and fibula fracture.  No signs of compartment syndrome.  EDP discussed the case with orthopedic surgery, Dr. Onesimo, who recommended admission and transfer to [facility] for possible orthopedic surgical intervention.   Currently she is undergone a knee external fixator and underwent external fixation 11/7.  On 11/10 she is going for ORIF.  Hemoglobin has trended down and PT/OT initially recommending no follow up but will need to re-evaluate.   Assessment and Plan:  Closed Comminuted fracture of left tibia plateau and Closed comminuted fracture of the left fibular head/neck: Secondary to trauma from fall and noted on x-ray/CT imaging. Complicated by underlying osteoporosis. Orthopedic surgery consulted and performed external fixation of the left leg/tibia and closed reduction of the left tibial plateau fracture on 11/7;  Now she is being taken to the OR for ORIF on 05/02/24 of the Left Bicondylar Tibial Plateau Fx and had her Left Spanning Knee External Fixation removed. -Recommendation for nonweightbearing and Lovenox  per orthopedic surgery and aggressive ice and elevation for swelling control.  Per orthopedic surgery if the surgery will be more than 10 days out that she may benefit from getting footplate from OT to help maintain neutral ankle position -Continue pain control with p.o. acetaminophen  650 mg Q6 scheduled,  IV ketorolac  Q8 for 5 days, p.o. oxycodone  5 to 10 mg p.o. every 4 as needed for moderate pain and severe pain and IV morphine  1 to 2 mg Q2 as needed for breakthrough pain -Bowel regimen with bisacodyl 10 mg RC daily as needed for moderate constipation, MiraLAX  17 g p.o. twice daily and senna docusate 1 tab p.o. twice daily; -PT/OT recommendations: Recommending no follow-up at this time but will need to be re-evaluated after her ORIF   Fall: Secondary to loss of balance and fall off a Step stool. PT/OT recommendations no follow up initially but will need to see after ORIF   Open left third digit fracture: Secondary to fall. Cefazolin  x3 doses for antibiotic coverage. Orthopedic surgery consulted. Fingers redressed.   Leukocytosis: Fluctuating. Likely reactive and secondary to trauma and fracture and now surgical intervention. WBC Trend:  Recent Labs  Lab 04/28/24 0438 04/29/24 0334 04/29/24 1847 04/30/24 0601 05/01/24 0448 05/01/24 1240 05/02/24 0622  WBC 16.1* 12.8* 16.3* 12.4* 14.2* 14.4* 15.2*  -No fevers or symptoms for infection. Patient does have an open fracture, however. Antibiotics initiated as a Preoperative precaution. .   Generalized Anxiety Disorder: Continue Duloxetine  60 mg po Daily    Hyperlipidemia: Continue Simvastatin  20 mg po daily.   Constipation: See Bowel regimen as above; Still has not had a BM and if necessary will give her an Enema.   Hyponatremia: Na+ Trend went from 135 -> 133 -> 134. CTM and Trend and repeat CMP in the AM.   Osteoporosis: Continue Calcium -Vitamin D  1 tab po Daily w/ Breakfast   Tobacco Use: Smoking Cessation recommended and given. Nicotine  patch 21 mg  TD ordered and being continued   Acute Blood Loss Anemia / Normocytic Anemia: Baseline hemoglobin appears to be around 12-13. Hemoglobin of 11.8 on admission with drift downward. Postoperatively, hemoglobin trended down to 7.2 but is now 7.4 this AM. Continue to Trend CBC and transfuse for  hemoglobin less thn 7Hgb/Hct Trend:  Recent Labs  Lab 04/28/24 0438 04/29/24 0334 04/29/24 1847 04/30/24 0601 05/01/24 0448 05/01/24 1240 05/02/24 0622  HGB 10.4* 8.7* 8.3* 7.4* 7.2* 7.4* 7.4*  HCT 31.2* 26.1* 24.8* 22.2* 21.7* 22.4* 22.4*  MCV 96.9 95.6 96.1 96.1 96.9 97.0 96.6  -Check Anemia Panel in the AM. CTM For S/Sx of Bleeding; No overt bleeding noted.  Transfuse for hemoglobin less than 7.0 . Repeat CBC in the the AM  Hypoalbuminemia: Patient's Albumin Lvl went from 4.1 -> 2.4. CTM & Trend & repeat CMP in the AM    DVT prophylaxis: SCDs Start: 05/02/24 1210 enoxaparin  (LOVENOX ) injection 40 mg Start: 04/30/24 0800 SCDs Start: 04/29/24 1742    Code Status: Full Code Family Communication: No family present @ bedside   Disposition Plan:  Level of care: Med-Surg Status is: Inpatient Remains inpatient appropriate because: Needs re-evaluation by PT/OT and clearance by Orthopedic Surgery   Consultants:  Orthopedic Surgery  Procedures:  Procedures by Dr. Kendal on 04/29/24: CPT 20690-External fixation of left leg/tibia CPT 27532-Closed reduction of left tibial plateau fracture CPT 15852-Dressing change to left hand  Procedures by Dr. Kendal on 05/02/24: CPT 27536-Open reduction internal fixation of left bicondylar tibial plateau fracture CPT 20694-Removal of left knee external fixation   Antimicrobials:  Anti-infectives (From admission, onward)    Start     Dose/Rate Route Frequency Ordered Stop   05/02/24 1400  ceFAZolin  (ANCEF ) IVPB 2g/100 mL premix        2 g 200 mL/hr over 30 Minutes Intravenous Every 8 hours 05/02/24 1209 05/03/24 1359   05/02/24 1032  vancomycin (VANCOCIN) powder  Status:  Discontinued          As needed 05/02/24 1032 05/02/24 1113   04/29/24 2200  ceFAZolin  (ANCEF ) IVPB 2g/100 mL premix        2 g 200 mL/hr over 30 Minutes Intravenous Every 8 hours 04/29/24 1742 04/30/24 1406   04/29/24 0600  ceFAZolin  (ANCEF ) IVPB 2g/100 mL premix         2 g 200 mL/hr over 30 Minutes Intravenous On call to O.R. 04/28/24 1743 04/29/24 1549   04/27/24 2200  ceFAZolin  (ANCEF ) IVPB 2g/100 mL premix  Status:  Discontinued        2 g 200 mL/hr over 30 Minutes Intravenous Every 8 hours 04/27/24 2059 04/29/24 1742   04/27/24 1900  ceFAZolin  (ANCEF ) IVPB 1 g/50 mL premix  Status:  Discontinued        2 g 200 mL/hr over 30 Minutes Intravenous  Once 04/27/24 1855 04/27/24 2159       Subjective: Seen and examined at bedside and had come back from surgery and states her pain is fairly well-controlled.  No nausea or vomiting.  Denied lightheadedness or dizziness but states that she still has not had a bowel movement.  No other concerns or complaints this time  Objective: Vitals:   05/02/24 1130 05/02/24 1145 05/02/24 1200 05/02/24 1218  BP: 135/64 124/61  135/88  Pulse: (!) 103 96 (!) 108 (!) 102  Resp: 16 13 13 14   Temp:   98.7 F (37.1 C) (P) 99.1 F (37.3 C)  TempSrc:    (P) Oral  SpO2: 95% 94% 97%   Weight:      Height:        Intake/Output Summary (Last 24 hours) at 05/02/2024 1533 Last data filed at 05/02/2024 1114 Gross per 24 hour  Intake 833 ml  Output 10 ml  Net 823 ml   Filed Weights   04/28/24 1655  Weight: 57.6 kg   Examination: Physical Exam:  Constitutional: Thin chronically ill-appearing Caucasian female no acute distress and appears calm Respiratory: Diminished to auscultation bilaterally, no wheezing, rales, rhonchi or crackles. Normal respiratory effort and patient is not tachypenic. No accessory muscle use.  Unlabored breathing Cardiovascular: RRR, no murmurs / rubs / gallops. S1 and S2 auscultated. No extremity edema and left leg is wrapped Abdomen: Soft, non-tender, non-distended. Bowel sounds positive.  GU: Deferred. Musculoskeletal: No clubbing / cyanosis of digits/nails. No joint deformity upper and lower extremities but left leg is wrapped and external fixator has been removed Skin: No rashes, lesions,  ulcers on a limited skin evaluation. No induration; Warm and dry.  Neurologic: CN 2-12 grossly intact with no focal deficits. Romberg sign and cerebellar reflexes not assessed.  Psychiatric: Normal judgment and insight. Alert and oriented x 3. Normal mood and appropriate affect.   Data Reviewed: I have personally reviewed following labs and imaging studies  CBC: Recent Labs  Lab 04/29/24 0334 04/29/24 1847 04/30/24 0601 05/01/24 0448 05/01/24 1240 05/02/24 0622  WBC 12.8* 16.3* 12.4* 14.2* 14.4* 15.2*  NEUTROABS 7.5  --  10.0* 8.6* 8.9* 10.2*  HGB 8.7* 8.3* 7.4* 7.2* 7.4* 7.4*  HCT 26.1* 24.8* 22.2* 21.7* 22.4* 22.4*  MCV 95.6 96.1 96.1 96.9 97.0 96.6  PLT 299 281 245 324 329 353   Basic Metabolic Panel: Recent Labs  Lab 04/28/24 0438 04/29/24 0334 04/29/24 1847 04/30/24 0601 05/01/24 0448 05/02/24 0622  NA 135 131*  --  135 133* 134*  K 4.5 3.9  --  4.0 3.8 4.1  CL 99 97*  --  98 96* 100  CO2 27 25  --  25 26 26   GLUCOSE 127* 117*  --  150* 97 PENDING  BUN 14 10  --  7 11 12   CREATININE 0.67 0.76 0.81 0.66 0.76 0.70  CALCIUM  8.6* 8.0*  --  8.1* 8.7* 8.1*  MG 1.8  --   --   --   --  1.7  PHOS 3.2  --   --   --   --  2.7   GFR: Estimated Creatinine Clearance: 69 mL/min (by C-G formula based on SCr of 0.7 mg/dL). Liver Function Tests: Recent Labs  Lab 05/02/24 0622  AST 21  ALT 12  ALKPHOS 42  BILITOT 0.6  PROT 5.2*  ALBUMIN 2.4*   No results for input(s): LIPASE, AMYLASE in the last 168 hours. No results for input(s): AMMONIA in the last 168 hours. Coagulation Profile: No results for input(s): INR, PROTIME in the last 168 hours. Cardiac Enzymes: No results for input(s): CKTOTAL, CKMB, CKMBINDEX, TROPONINI in the last 168 hours. BNP (last 3 results) No results for input(s): PROBNP in the last 8760 hours. HbA1C: Recent Labs    05/02/24 0622  HGBA1C 5.1   CBG: No results for input(s): GLUCAP in the last 168 hours. Lipid  Profile: No results for input(s): CHOL, HDL, LDLCALC, TRIG, CHOLHDL, LDLDIRECT in the last 72 hours. Thyroid  Function Tests: No results for input(s): TSH, T4TOTAL, FREET4, T3FREE, THYROIDAB in the last 72 hours. Anemia Panel: No results for input(s): VITAMINB12, FOLATE, FERRITIN, TIBC,  IRON, RETICCTPCT in the last 72 hours. Sepsis Labs: No results for input(s): PROCALCITON, LATICACIDVEN in the last 168 hours.  Recent Results (from the past 240 hours)  Culture, blood (Routine X 2) w Reflex to ID Panel     Status: None   Collection Time: 04/27/24 10:17 PM   Specimen: BLOOD RIGHT FOREARM  Result Value Ref Range Status   Specimen Description BLOOD RIGHT FOREARM  Final   Special Requests   Final    BOTTLES DRAWN AEROBIC AND ANAEROBIC Blood Culture adequate volume   Culture   Final    NO GROWTH 5 DAYS Performed at [facility], [address], [place], [place] [address]    Report Status 05/02/2024 FINAL  Final  Culture, blood (Routine X 2) w Reflex to ID Panel     Status: None   Collection Time: 04/27/24 10:17 PM   Specimen: BLOOD  Result Value Ref Range Status   Specimen Description BLOOD BLOOD RIGHT HAND  Final   Special Requests   Final    BOTTLES DRAWN AEROBIC AND ANAEROBIC Blood Culture adequate volume   Culture   Final    NO GROWTH 5 DAYS Performed at [facility], [address] [place], [place] [address]    Report Status 05/02/2024 FINAL  Final  MRSA Next Gen by PCR, Nasal     Status: Abnormal   Collection Time: 04/28/24  5:52 PM   Specimen: Nasal Mucosa; Nasal Swab  Result Value Ref Range Status   MRSA by PCR Next Gen DETECTED (A) NOT DETECTED Final    Comment: CRITICAL RESULTS CALLED TO, READ BACK BY AND VERIFIED WITH : RN M.SHIESTHA ON 04/29/24 AT 0042 BY NM (NOTE) The GeneXpert MRSA Assay (FDA approved for NASAL specimens only), is one component of a comprehensive MRSA colonization surveillance program. It is not intended to  diagnose MRSA infection nor to guide or monitor treatment for MRSA infections. Test performance is not FDA approved in patients less than 25 years old. Performed at [facility], [address], [place], [place] [address]     Radiology Studies: DG Knee Left Port Result Date: 05/02/2024 CLINICAL DATA:  03948 Fracture 03948 EXAM: PORTABLE LEFT KNEE - 1-2 VIEW COMPARISON:  April 29, 2024 FINDINGS: Osteopenia. Status post ORIF of the proximal tibia as well as wire cerclage of the patella. Orthopedic hardware is intact and without periprosthetic fracture or lucency. Ghost screw tracks of the distal femur and tibia. Revisualization of comminuted fractures of the proximal tibia and fibula. Tibia fracture fragments are in improved alignment. Intra-articular air. IMPRESSION: Expected postsurgical appearance status post ORIF of the proximal tibia Electronically Signed   By: Corean Salter M.D.   On: 05/02/2024 12:48   DG Knee Complete 4 Views Left Result Date: 05/02/2024 CLINICAL DATA:  Elective surgery. EXAM: LEFT KNEE - COMPLETE 4+ VIEW COMPARISON:  Radiograph 04/29/2024 FINDINGS: 5 fluoroscopic spot views of the left knee submitted from the operating room. Removal of external fixator. Interval lateral plate and screw fixation of proximal tibial fracture. Grossly unchanged alignment of proximal fibular fracture. Previous ORIF of patella. Fluoroscopy time 1 minutes 28 seconds. Dose 2.96 mGy. IMPRESSION: Intraoperative fluoroscopy during ORIF of proximal tibial fracture. Electronically Signed   By: Andrea Gasman M.D.   On: 05/02/2024 11:00   DG C-Arm 1-60 Min-No Report Result Date: 05/02/2024 Fluoroscopy was utilized by the requesting physician.  No radiographic interpretation.   DG C-Arm 1-60 Min-No Report Result Date: 05/02/2024 Fluoroscopy was utilized by the  requesting physician.  No radiographic interpretation.   Scheduled Meds:  sodium chloride    Intravenous Once   acetaminophen    650 mg Oral Q6H   calcium -vitamin D   1 tablet Oral Q breakfast   Chlorhexidine  Gluconate Cloth  6 each Topical Q0600   DULoxetine   60 mg Oral Daily   enoxaparin  (LOVENOX ) injection  40 mg Subcutaneous Q24H   ketorolac   15 mg Intravenous Q8H   loratadine   10 mg Oral Daily   mupirocin  ointment   Nasal BID   nicotine   21 mg Transdermal Daily   pneumococcal 20-valent conjugate vaccine  0.5 mL Intramuscular Tomorrow-1000   polyethylene glycol  17 g Oral BID   senna-docusate  1 tablet Oral BID   simvastatin   20 mg Oral q1800   Continuous Infusions:  sodium chloride  40 mL/hr at 05/02/24 1246    ceFAZolin  (ANCEF ) IV 2 g (05/02/24 1426)   magnesium  sulfate bolus IVPB      LOS: 5 days   Alejandro Marker, DO [facility] Available via Epic secure chat 7am-7pm After these hours, please refer to coverage provider listed on [email or web address] 05/02/2024, 3:33 PM

## 2024-05-02 NOTE — Progress Notes (Signed)
   05/02/24 0740  Vitals  Temp 97.9 F (36.6 C)  BP 119/74  MAP (mmHg) 88  BP Location Left Arm  BP Method Automatic  Patient Position (if appropriate) Sitting  Pulse Rate (!) 114  Pulse Rate Source Monitor  Resp 19  MEWS COLOR  MEWS Score Color Yellow  Oxygen Therapy  SpO2 100 %  O2 Device Room Air  MEWS Score  MEWS Temp 0  MEWS Systolic 0  MEWS Pulse 2  MEWS RR 0  MEWS LOC 0  MEWS Score 2   Patient taken to OR before v/s could be rechecked. Chelsea, RN made aware

## 2024-05-02 NOTE — Plan of Care (Signed)
   Problem: Education: Goal: Knowledge of General Education information will improve Description Including pain rating scale, medication(s)/side effects and non-pharmacologic comfort measures Outcome: Progressing

## 2024-05-02 NOTE — Op Note (Signed)
 Orthopaedic Surgery Operative Note (CSN: [id number] ) Date of Surgery: 05/02/2024  Admit Date: 04/27/2024   Diagnoses: Pre-Op Diagnoses: Left bicondylar tibial plateau fracture  Post-Op Diagnosis: Same  Procedures: CPT 27536-Open reduction internal fixation of left bicondylar tibial plateau fracture CPT 20694-Removal of left knee external fixation  Surgeons : Primary: Kendal Franky SQUIBB, MD  Assistant: Lauraine Moores, PA-C  Location: OR 3   Anesthesia: General   Antibiotics: Ancef  2g preop with 1 gm vancomycin powder placed topically   Tourniquet time: None    Estimated Blood Loss: Minimal  Complications:* No complications entered in OR log *   Specimens:* No specimens in log *   Implants: Implant Name Type Inv. Item Serial No. Manufacturer Lot No. LRB No. Used Action  PLATE PROX TIBIA LEFT 8H - [id number] Plate PLATE PROX TIBIA LEFT 8H  [employer]  Left 1 Implanted  SCREW LOCKING VA 3.5X75MM - [id number] Screw SCREW LOCKING VA 3.5X75MM  [employer]  Left 3 Implanted  SCREW LOCK CORT ST 3.5X30 - [id number] Screw SCREW LOCK CORT ST 3.5X30  [employer]  Left 1 Implanted  SCREW LOCK CORT ST 3.5X28 - [id number] Screw SCREW LOCK CORT ST 3.5X28  [employer]  Left 1 Implanted  SCREW LOCKING 3.5X80MM VA - [id number] Screw SCREW LOCKING 3.5X80MM VA  [employer]  Left 2 Implanted  SCREW LOCK CORT ST 3.5X26 - [id number] Screw SCREW LOCK CORT ST 3.5X26  [employer]  Left 1 Implanted     Indications for Surgery: 58 year old female who sustained a left bicondylar tibial plateau fracture.  She was taken for a closed reduction and external fixation because her swelling was too significant to definitively fix her tibial plateau.  Due to her unstable nature of her injury I recommend she was indicated for open reduction internal fixation.  Her swelling improved over the weekend.  Risks included but not limited to bleeding, infection, malunion, nonunion,  hardware failure, hardware rotation, nerve and blood vessel injury, DVT, even the possibility anesthetic complications.  She agreed to proceed with surgery and consent was obtained.  Operative Findings: 1.  Open reduction internal fixation of left bicondylar tibial plateau fracture using [employer] VA 3.5 mm proximal tibial locking plate. 2.  Removal of spanning knee external fixation.  Procedure: The patient was identified in the preoperative holding area. Consent was confirmed with the patient and their family and all questions were answered. The operative extremity was marked after confirmation with the patient. she was then brought back to the operating room by our anesthesia colleagues.  She was placed under general anesthetic and carefully transferred over to radiolucent flattop table.  A bump was placed under her operative hip.  Nonsterile tourniquet was placed to the upper thigh.  Her left lower extremity was then prepped and draped in usual sterile fashion.  The external fixator was prepped into the field.  A timeout was performed to verify the patient, the procedure, and the extremity.  Preoperative antibiotics were dosed.  Fluoroscopic imaging showed the unstable nature of her injury.  The lateral bar for the external fixation was removed.  I cut the medial bar and placed to keep the length.  I then performed a curvilinear incision along the lateral aspect of the proximal tibia.  The tourniquet was inflated prior to this to 250 mmHg.  Total tourniquet time as noted above.  I carried this incision down through skin and subcutaneous tissue and incised through the IT band to expose the lateral condyle of the proximal  tibia.  I released the proximal portion all the way back to the fibular head.  I then performed a submeniscal arthrotomy and tagged the capsule with a 0 Vicryl suture.  There was no meniscus tear.  I then entered the splint anterior laterally and proceeded to use a footed tamp to elevate  the articular surface back to an anatomic reduction.  The medial joint was relatively well aligned.  K wires were used to hold the lateral joint reduction.  Once I had the joint reduced I then turned my attention to the plate fixation.  I slid a Synthe[employer] VA 3.5 mm proximal tibial locking plate submuscularly along the lateral cortex of the tibia.  I held provisionally with a K wire.  I then placed a locking screw proximally.  I then percutaneously placed 3.5 millimeter screws in the tibial shaft to align the fracture and bring the plate flush to bone.  I returned to the proximal segment and proceeded to place 3.5 millimeter screws.  I was able to get screw into the posterior medial fragment and placed a buttress screw to provide support for the medial column.  Lateral fluoroscopic imaging was obtained after I remove the external fixator.  The incision was copiously irrigated.  A gram of vancomycin powder was placed into the incision.  A layered closure of 0 Vicryl, 2-0 Monocryl and 3-0 nylon was used to close the skin.  Sterile dressings were applied.  The patient was then awoke from anesthesia and taken to the PACU in stable condition.  Post Op Plan/Instructions: Patient will be nonweightbearing to the left lower extremity.  She will receive postoperative Ancef .  She will receive Lovenox  for DVT prophylaxis.  Will have her mobilize with physical and occupational therapy.  I was present and performed the entire surgery.  Lauraine Moores, PA-C did assist me throughout the case. An assistant was necessary given the difficulty in approach, maintenance of reduction and ability to instrument the fracture.   Franky Light, MD [facility]

## 2024-05-02 NOTE — Progress Notes (Signed)
 OT Cancellation Note  Patient Details Name: [name] MRN: [id number] DOB: [birth year]   Cancelled Treatment:    Reason Eval/Treat Not Completed: Patient at procedure or test/ unavailable (ORIF - OT to f/u post-op.)  Lucie D Causey 05/02/2024, 8:00 AM

## 2024-05-02 NOTE — Anesthesia Preprocedure Evaluation (Addendum)
 Anesthesia Evaluation  Patient identified by MRN, date of birth, ID band Patient awake    Reviewed: Allergy & Precautions, NPO status , Patient's Chart, lab work & pertinent test results  Airway Mallampati: II  TM Distance: >3 FB Neck ROM: Full    Dental  (+) Edentulous Upper, Edentulous Lower, Dental Advisory Given   Pulmonary Patient abstained from smoking., former smoker   Pulmonary exam normal breath sounds clear to auscultation       Cardiovascular hypertension, Normal cardiovascular exam Rhythm:Regular Rate:Normal  HLD   Neuro/Psych  PSYCHIATRIC DISORDERS Anxiety Depression    negative neurological ROS     GI/Hepatic negative GI ROS, Neg liver ROS,,,  Endo/Other  negative endocrine ROS    Renal/GU negative Renal ROS  negative genitourinary   Musculoskeletal negative musculoskeletal ROS (+)    Abdominal   Peds  Hematology  (+) Blood dyscrasia, anemia   Anesthesia Other Findings   Reproductive/Obstetrics                              Anesthesia Physical Anesthesia Plan  ASA: 3  Anesthesia Plan: General   Post-op Pain Management: Tylenol  PO (pre-op)*   Induction: Intravenous  PONV Risk Score and Plan: 3 and Midazolam , Dexamethasone  and Ondansetron   Airway Management Planned: Oral ETT  Additional Equipment:   Intra-op Plan:   Post-operative Plan: Extubation in OR  Informed Consent: I have reviewed the patients History and Physical, chart, labs and discussed the procedure including the risks, benefits and alternatives for the proposed anesthesia with the patient or authorized representative who has indicated his/her understanding and acceptance.     Dental advisory given  Plan Discussed with: CRNA  Anesthesia Plan Comments:          Anesthesia Quick Evaluation

## 2024-05-03 ENCOUNTER — Encounter (HOSPITAL_COMMUNITY): Payer: Self-pay | Admitting: Student

## 2024-05-03 DIAGNOSIS — K5900 Constipation, unspecified: Secondary | ICD-10-CM | POA: Diagnosis not present

## 2024-05-03 DIAGNOSIS — D509 Iron deficiency anemia, unspecified: Secondary | ICD-10-CM | POA: Diagnosis not present

## 2024-05-03 DIAGNOSIS — W19XXXA Unspecified fall, initial encounter: Secondary | ICD-10-CM | POA: Diagnosis not present

## 2024-05-03 DIAGNOSIS — S82142A Displaced bicondylar fracture of left tibia, initial encounter for closed fracture: Secondary | ICD-10-CM | POA: Diagnosis not present

## 2024-05-03 LAB — CBC WITH DIFFERENTIAL/PLATELET
Abs Immature Granulocytes: 0.08 K/uL — ABNORMAL HIGH (ref 0.00–0.07)
Basophils Absolute: 0 K/uL (ref 0.0–0.1)
Basophils Relative: 0 %
Eosinophils Absolute: 0 K/uL (ref 0.0–0.5)
Eosinophils Relative: 0 %
HCT: 19.8 % — ABNORMAL LOW (ref 36.0–46.0)
Hemoglobin: 6.6 g/dL — CL (ref 12.0–15.0)
Immature Granulocytes: 1 %
Lymphocytes Relative: 8 %
Lymphs Abs: 1.2 K/uL (ref 0.7–4.0)
MCH: 32 pg (ref 26.0–34.0)
MCHC: 33.3 g/dL (ref 30.0–36.0)
MCV: 96.1 fL (ref 80.0–100.0)
Monocytes Absolute: 2.5 K/uL — ABNORMAL HIGH (ref 0.1–1.0)
Monocytes Relative: 16 %
Neutro Abs: 11.9 K/uL — ABNORMAL HIGH (ref 1.7–7.7)
Neutrophils Relative %: 75 %
Platelets: 344 K/uL (ref 150–400)
RBC: 2.06 MIL/uL — ABNORMAL LOW (ref 3.87–5.11)
RDW: 13.5 % (ref 11.5–15.5)
WBC: 15.7 K/uL — ABNORMAL HIGH (ref 4.0–10.5)
nRBC: 0 % (ref 0.0–0.2)

## 2024-05-03 LAB — HEMOGLOBIN AND HEMATOCRIT, BLOOD
HCT: 25.5 % — ABNORMAL LOW (ref 36.0–46.0)
Hemoglobin: 8.6 g/dL — ABNORMAL LOW (ref 12.0–15.0)

## 2024-05-03 LAB — COMPREHENSIVE METABOLIC PANEL WITH GFR
ALT: 12 U/L (ref 0–44)
AST: 23 U/L (ref 15–41)
Albumin: 2.4 g/dL — ABNORMAL LOW (ref 3.5–5.0)
Alkaline Phosphatase: 41 U/L (ref 38–126)
Anion gap: 10 (ref 5–15)
BUN: 6 mg/dL (ref 6–20)
CO2: 26 mmol/L (ref 22–32)
Calcium: 8.3 mg/dL — ABNORMAL LOW (ref 8.9–10.3)
Chloride: 100 mmol/L (ref 98–111)
Creatinine, Ser: 0.64 mg/dL (ref 0.44–1.00)
GFR, Estimated: >60 mL/min (ref >60)
Glucose, Bld: 126 mg/dL — ABNORMAL HIGH (ref 70–99)
Potassium: 4.6 mmol/L (ref 3.5–5.1)
Sodium: 136 mmol/L (ref 135–145)
Total Bilirubin: 0.5 mg/dL (ref 0.0–1.2)
Total Protein: 5.5 g/dL — ABNORMAL LOW (ref 6.5–8.1)

## 2024-05-03 LAB — MAGNESIUM: Magnesium: 2.1 mg/dL (ref 1.7–2.4)

## 2024-05-03 LAB — PREPARE RBC (CROSSMATCH)

## 2024-05-03 LAB — PHOSPHORUS: Phosphorus: 3.4 mg/dL (ref 2.5–4.6)

## 2024-05-03 MED ORDER — SODIUM CHLORIDE 0.9% IV SOLUTION: Freq: Once | INTRAVENOUS | Status: AC

## 2024-05-03 MED ORDER — BISACODYL 10 MG RE SUPP
10.0000 mg | Freq: Once | RECTAL | Status: AC
  Administered 2024-05-03: 10 mg via RECTAL
  Filled 2024-05-03: qty 1

## 2024-05-03 NOTE — Progress Notes (Signed)
 PROGRESS NOTE    [name]  FMW:[id number] DOB: [birth year] DOA: 04/27/2024 PCP: Lavell Bari [name]   Brief Narrative:  The patient is 58 y.o. female with medical history significant for hyperlipidemia, chronic anxiety, osteoporosis, current tobacco smoker, who presents to the ER via EMS from home after a mechanical fall. The patient was on a stepstool, taking down her halloween decorations when she lost her balance and fell.    In the ER, she was noted to have deformity and laceration to her left middle finger with fracture.  Imaging revealed comminuted proximal tibia and fibula fracture.  No signs of compartment syndrome.  EDP discussed the case with orthopedic surgery, Dr. Onesimo, who recommended admission and transfer to [facility] for possible orthopedic surgical intervention.   Currently she is undergone a knee external fixator and underwent external fixation 11/7.  On 11/10 she is going for ORIF.  Hemoglobin has trended down and PT/OT initially recommending no follow up but will need to re-evaluate.   Assessment and Plan:  Closed Comminuted fracture of left tibia plateau and Closed comminuted fracture of the left fibular head/neck: Secondary to trauma from fall and noted on x-ray/CT imaging. Complicated by underlying osteoporosis. Orthopedic surgery consulted and performed external fixation of the left leg/tibia and closed reduction of the left tibial plateau fracture on 11/7;  Now she is being taken to the OR for ORIF on 05/02/24 of the Left Bicondylar Tibial Plateau Fx and had her Left Spanning Knee External Fixation removed. -Recommendation for nonweightbearing and Lovenox  per orthopedic surgery and aggressive ice and elevation for swelling control.  Per orthopedic surgery if the surgery will be more than 10 days out that she may benefit from getting footplate from OT to help maintain neutral ankle position -Continue pain control with p.o. acetaminophen  650 mg Q6 scheduled,  IV ketorolac  Q8 for 5 days, p.o. oxycodone  5 to 10 mg p.o. every 4 as needed for moderate pain and severe pain and IV morphine  1 to 2 mg Q2 as needed for breakthrough pain -Bowel regimen with bisacodyl 10 mg RC daily as needed for moderate constipation, MiraLAX  17 g p.o. twice daily and senna docusate 1 tab p.o. twice daily; -PT/OT recommendations: Recommending no follow-up at this time but will need to be re-evaluated after her ORIF   Fall: Secondary to loss of balance and fall off a Step stool. PT/OT recommendations no follow up initially but will need to see after ORIF   Open left third digit fracture: Secondary to fall. Cefazolin  x3 doses for antibiotic coverage. Orthopedic surgery consulted. Fingers redressed.   Leukocytosis: Fluctuating. Likely reactive and secondary to trauma and fracture and now surgical intervention. WBC Trend:  Recent Labs  Lab 04/29/24 0334 04/29/24 1847 04/30/24 0601 05/01/24 0448 05/01/24 1240 05/02/24 0622 05/03/24 0418  WBC 12.8* 16.3* 12.4* 14.2* 14.4* 15.2* 15.7*  -No fevers or symptoms for infection. Patient does have an open fracture, however. Antibiotics initiated as a Preoperative precaution. .   Generalized Anxiety Disorder: Continue Duloxetine  60 mg po Daily    Hyperlipidemia: Continue Simvastatin  20 mg po daily.   Constipation: See Bowel regimen as above; Still has not had a BM and if necessary will give her an Enema.   Hyponatremia: Na+ Trend went from 135 -> 133 -> 134. CTM and Trend and repeat CMP in the AM.   Osteoporosis: Continue Calcium -Vitamin D  1 tab po Daily w/ Breakfast   Tobacco Use: Smoking Cessation recommended and given. Nicotine  patch 21 mg  TD ordered and being continued   Acute Blood Loss Anemia / Normocytic Anemia: Baseline hemoglobin appears to be around 12-13. Hemoglobin of 11.8 on admission with drift downward. Postoperatively, hemoglobin trended down to 7.2 but is now 7.4 this AM. Hgb/Hct Trend:  Recent Labs  Lab  04/29/24 0334 04/29/24 1847 04/30/24 0601 05/01/24 0448 05/01/24 1240 05/02/24 0622 05/03/24 0418  HGB 8.7* 8.3* 7.4* 7.2* 7.4* 7.4* 6.6*  HCT 26.1* 24.8* 22.2* 21.7* 22.4* 22.4* 19.8*  MCV 95.6 96.1 96.1 96.9 97.0 96.6 96.1  -Check Anemia Panel in the AM. CTM For S/Sx of Bleeding; No overt bleeding noted. Continue to Trend CBC daily and transfuse for Hgb < 7.0.   Hypoalbuminemia: Patient's Albumin Lvl went from 4.1 -> 2.4. CTM & Trend & repeat CMP in the AM    DVT prophylaxis: SCDs Start: 05/02/24 1210 enoxaparin  (LOVENOX ) injection 40 mg Start: 04/30/24 0800 SCDs Start: 04/29/24 1742    Code Status: Full Code Family Communication: No family present @ bedside   Disposition Plan:  Level of care: Med-Surg Status is: Inpatient Remains inpatient appropriate because: Further clinical improvement and clearance by the specialist   Consultants:  Orthopedic Surgery  Procedures:  Procedures by Dr. Kendal on 04/29/24: CPT 20690-External fixation of left leg/tibia CPT 27532-Closed reduction of left tibial plateau fracture CPT 15852-Dressing change to left hand   Procedures by Dr. Kendal on 05/02/24: CPT 27536-Open reduction internal fixation of left bicondylar tibial plateau fracture CPT 20694-Removal of left knee external fixation  Antimicrobials:  Anti-infectives (From admission, onward)    Start     Dose/Rate Route Frequency Ordered Stop   05/02/24 1400  ceFAZolin  (ANCEF ) IVPB 2g/100 mL premix        2 g 200 mL/hr over 30 Minutes Intravenous Every 8 hours 05/02/24 1209 05/03/24 1137   05/02/24 1032  vancomycin (VANCOCIN) powder  Status:  Discontinued          As needed 05/02/24 1032 05/02/24 1113   04/29/24 2200  ceFAZolin  (ANCEF ) IVPB 2g/100 mL premix        2 g 200 mL/hr over 30 Minutes Intravenous Every 8 hours 04/29/24 1742 04/30/24 1406   04/29/24 0600  ceFAZolin  (ANCEF ) IVPB 2g/100 mL premix        2 g 200 mL/hr over 30 Minutes Intravenous On call to O.R. 04/28/24  1743 04/29/24 1549   04/27/24 2200  ceFAZolin  (ANCEF ) IVPB 2g/100 mL premix  Status:  Discontinued        2 g 200 mL/hr over 30 Minutes Intravenous Every 8 hours 04/27/24 2059 04/29/24 1742   04/27/24 1900  ceFAZolin  (ANCEF ) IVPB 1 g/50 mL premix  Status:  Discontinued        2 g 200 mL/hr over 30 Minutes Intravenous  Once 04/27/24 1855 04/27/24 2159       Subjective: Seen and examined at bedside and states that her pain level is a 7 out of 10.  Still has not had a bowel movement yet.  No nausea or vomiting.  Receiving a unit of blood this morning.  Denied any other concerns or complaints at this time.  Objective: Vitals:   05/03/24 0712 05/03/24 0812 05/03/24 0953 05/03/24 1506  BP: 130/73 121/73 (!) 146/77 131/76  Pulse: (!) 103 (!) 105 (!) 111 (!) 111  Resp: 16 16 18 19   Temp: 98.3 F (36.8 C) 97.7 F (36.5 C) 98 F (36.7 C) 97.9 F (36.6 C)  TempSrc: Oral  Oral   SpO2: 100% 100% 100%  100%  Weight:      Height:        Intake/Output Summary (Last 24 hours) at 05/03/2024 1601 Last data filed at 05/03/2024 9046 Gross per 24 hour  Intake 1541.87 ml  Output --  Net 1541.87 ml   Filed Weights   04/28/24 1655  Weight: 57.6 kg   Examination: Physical Exam:  Constitutional: WN/WD Caucasian female in no acute distress Respiratory: Mildly diminished to auscultation bilaterally with some coarse breath sounds, no wheezing, rales, rhonchi or crackles. Normal respiratory effort and patient is not tachypenic. No accessory muscle use.  Unlabored breathing Cardiovascular: RRR, no murmurs / rubs / gallops. S1 and S2 auscultated. No extremity edema. Abdomen: Soft, non-tender, non-distended. Bowel sounds positive.  GU: Deferred. Musculoskeletal: No clubbing / cyanosis of digits/nails. No joint deformity upper and lower extremities.  Skin: Left leg is wrapped and no appreciable rashes or lesions on limited skin evaluation. Neurologic: CN 2-12 grossly intact with no focal deficits.  Romberg sign and cerebellar reflexes not assessed.  Psychiatric: Normal judgment and insight. Alert and oriented x 3. Normal mood and appropriate affect.   Data Reviewed: I have personally reviewed following labs and imaging studies  CBC: Recent Labs  Lab 04/30/24 0601 05/01/24 0448 05/01/24 1240 05/02/24 0622 05/03/24 0418 05/03/24 1207  WBC 12.4* 14.2* 14.4* 15.2* 15.7*  --   NEUTROABS 10.0* 8.6* 8.9* 10.2* 11.9*  --   HGB 7.4* 7.2* 7.4* 7.4* 6.6* 8.6*  HCT 22.2* 21.7* 22.4* 22.4* 19.8* 25.5*  MCV 96.1 96.9 97.0 96.6 96.1  --   PLT 245 324 329 353 344  --    Basic Metabolic Panel: Recent Labs  Lab 04/28/24 0438 04/29/24 0334 04/29/24 1847 04/30/24 0601 05/01/24 0448 05/02/24 0622 05/03/24 0418  NA 135 131*  --  135 133* 134* 136  K 4.5 3.9  --  4.0 3.8 4.1 4.6  CL 99 97*  --  98 96* 100 100  CO2 27 25  --  25 26 26 26   GLUCOSE 127* 117*  --  150* 97 90 126*  BUN 14 10  --  7 11 12 6   CREATININE 0.67 0.76 0.81 0.66 0.76 0.70 0.64  CALCIUM  8.6* 8.0*  --  8.1* 8.7* 8.1* 8.3*  MG 1.8  --   --   --   --  1.7 2.1  PHOS 3.2  --   --   --   --  2.7 3.4   GFR: Estimated Creatinine Clearance: 69 mL/min (by C-G formula based on SCr of 0.64 mg/dL). Liver Function Tests: Recent Labs  Lab 05/02/24 0622 05/03/24 0418  AST 21 23  ALT 12 12  ALKPHOS 42 41  BILITOT 0.6 0.5  PROT 5.2* 5.5*  ALBUMIN 2.4* 2.4*   No results for input(s): LIPASE, AMYLASE in the last 168 hours. No results for input(s): AMMONIA in the last 168 hours. Coagulation Profile: No results for input(s): INR, PROTIME in the last 168 hours. Cardiac Enzymes: No results for input(s): CKTOTAL, CKMB, CKMBINDEX, TROPONINI in the last 168 hours. BNP (last 3 results) No results for input(s): PROBNP in the last 8760 hours. HbA1C: Recent Labs    05/02/24 0622  HGBA1C 5.1   CBG: No results for input(s): GLUCAP in the last 168 hours. Lipid Profile: No results for input(s): CHOL,  HDL, LDLCALC, TRIG, CHOLHDL, LDLDIRECT in the last 72 hours. Thyroid  Function Tests: No results for input(s): TSH, T4TOTAL, FREET4, T3FREE, THYROIDAB in the last 72 hours. Anemia Panel: No  results for input(s): VITAMINB12, FOLATE, FERRITIN, TIBC, IRON, RETICCTPCT in the last 72 hours. Sepsis Labs: No results for input(s): PROCALCITON, LATICACIDVEN in the last 168 hours.  Recent Results (from the past 240 hours)  Culture, blood (Routine X 2) w Reflex to ID Panel     Status: None   Collection Time: 04/27/24 10:17 PM   Specimen: BLOOD RIGHT FOREARM  Result Value Ref Range Status   Specimen Description BLOOD RIGHT FOREARM  Final   Special Requests   Final    BOTTLES DRAWN AEROBIC AND ANAEROBIC Blood Culture adequate volume   Culture   Final    NO GROWTH 5 DAYS Performed at [facility], [address], [place], [place] [address]    Report Status 05/02/2024 FINAL  Final  Culture, blood (Routine X 2) w Reflex to ID Panel     Status: None   Collection Time: 04/27/24 10:17 PM   Specimen: BLOOD  Result Value Ref Range Status   Specimen Description BLOOD BLOOD RIGHT HAND  Final   Special Requests   Final    BOTTLES DRAWN AEROBIC AND ANAEROBIC Blood Culture adequate volume   Culture   Final    NO GROWTH 5 DAYS Performed at [facility], [address] [place], [place] [address]    Report Status 05/02/2024 FINAL  Final  MRSA Next Gen by PCR, Nasal     Status: Abnormal   Collection Time: 04/28/24  5:52 PM   Specimen: Nasal Mucosa; Nasal Swab  Result Value Ref Range Status   MRSA by PCR Next Gen DETECTED (A) NOT DETECTED Final    Comment: CRITICAL RESULTS CALLED TO, READ BACK BY AND VERIFIED WITH : RN M.SHIESTHA ON 04/29/24 AT 0042 BY NM (NOTE) The GeneXpert MRSA Assay (FDA approved for NASAL specimens only), is one component of a comprehensive MRSA colonization surveillance program. It is not intended to diagnose MRSA infection nor to guide or monitor  treatment for MRSA infections. Test performance is not FDA approved in patients less than 64 years old. Performed at [facility], [address], [place], [place] [address]     Radiology Studies: DG Knee Left Port Result Date: 05/02/2024 CLINICAL DATA:  03948 Fracture 03948 EXAM: PORTABLE LEFT KNEE - 1-2 VIEW COMPARISON:  April 29, 2024 FINDINGS: Osteopenia. Status post ORIF of the proximal tibia as well as wire cerclage of the patella. Orthopedic hardware is intact and without periprosthetic fracture or lucency. Ghost screw tracks of the distal femur and tibia. Revisualization of comminuted fractures of the proximal tibia and fibula. Tibia fracture fragments are in improved alignment. Intra-articular air. IMPRESSION: Expected postsurgical appearance status post ORIF of the proximal tibia Electronically Signed   By: Corean Salter M.D.   On: 05/02/2024 12:48   DG Knee Complete 4 Views Left Result Date: 05/02/2024 CLINICAL DATA:  Elective surgery. EXAM: LEFT KNEE - COMPLETE 4+ VIEW COMPARISON:  Radiograph 04/29/2024 FINDINGS: 5 fluoroscopic spot views of the left knee submitted from the operating room. Removal of external fixator. Interval lateral plate and screw fixation of proximal tibial fracture. Grossly unchanged alignment of proximal fibular fracture. Previous ORIF of patella. Fluoroscopy time 1 minutes 28 seconds. Dose 2.96 mGy. IMPRESSION: Intraoperative fluoroscopy during ORIF of proximal tibial fracture. Electronically Signed   By: Andrea Gasman M.D.   On: 05/02/2024 11:00   DG C-Arm 1-60 Min-No Report Result Date: 05/02/2024 Fluoroscopy was utilized by the requesting physician.  No radiographic interpretation.   DG C-Arm 1-60 Min-No Report Result  Date: 05/02/2024 Fluoroscopy was utilized by the requesting physician.  No radiographic interpretation.   Scheduled Meds:  sodium chloride    Intravenous Once   acetaminophen   650 mg Oral Q6H   calcium -vitamin D   1 tablet  Oral Q breakfast   DULoxetine   60 mg Oral Daily   enoxaparin  (LOVENOX ) injection  40 mg Subcutaneous Q24H   ketorolac   15 mg Intravenous Q8H   loratadine   10 mg Oral Daily   mupirocin  ointment   Nasal BID   nicotine   21 mg Transdermal Daily   pneumococcal 20-valent conjugate vaccine  0.5 mL Intramuscular Tomorrow-1000   polyethylene glycol  17 g Oral BID   senna-docusate  1 tablet Oral BID   simvastatin   20 mg Oral q1800   Continuous Infusions:   LOS: 6 days   Alejandro Marker, DO [facility] Available via Epic secure chat 7am-7pm After these hours, please refer to coverage provider listed on [email or web address] 05/03/2024, 4:01 PM

## 2024-05-03 NOTE — Progress Notes (Signed)
 fingers on left hand soak and with complete dressing change, pt tolerated well

## 2024-05-03 NOTE — Progress Notes (Signed)
 Orthopaedic Trauma Progress Note  SUBJECTIVE: Patient doing fairly well this morning.  Pain controlled.  Notes that pain is improved from preoperatively.  Is glad that Ex-Fix was able to be removed.  She has been able to mobilize to the bathroom with the use of her walker yesterday afternoon and this morning.  No chest pain. No SOB. No nausea/vomiting. No other complaints.  Tolerating diet and fluids.  No other specific issues of note.  Patient would like to pursue rehab facility at discharge.  OBJECTIVE:  Vitals:   05/03/24 0712 05/03/24 0812  BP: 130/73 121/73  Pulse: (!) 103 (!) 105  Resp: 16 16  Temp: 98.3 F (36.8 C) 97.7 F (36.5 C)  SpO2: 100% 100%    Opiates Today (MME): Today's  total administered Morphine  Milligram Equivalents: 6 Opiates Yesterday (MME): Yesterday's total administered Morphine  Milligram Equivalents: 126  General: Sitting up in bed, no acute distress.  Pleasant and cooperative Respiratory: No increased work of breathing.  Operative Extremity (left lower extremity): Dressing clean, dry, intact.  Tenderness about the knee and proximal tibia as expected.  No significant tenderness throughout the calf.  Nontender through the thigh.  Ankle DF/PF intact.  Able to wiggle toes.  Endorses sensation light touch over all aspects of the foot.  2+ DP pulse.  IMAGING: Stable post op imaging left knee  LABS:  Results for orders placed or performed during the hospital encounter of 04/27/24 (from the past 24 hours)  CBC with Differential/Platelet     Status: Abnormal   Collection Time: 05/03/24  4:18 AM  Result Value Ref Range   WBC 15.7 (H) 4.0 - 10.5 K/uL   RBC 2.06 (L) 3.87 - 5.11 MIL/uL   Hemoglobin 6.6 (LL) 12.0 - 15.0 g/dL   HCT 80.1 (L) 63.9 - 53.9 %   MCV 96.1 80.0 - 100.0 fL   MCH 32.0 26.0 - 34.0 pg   MCHC 33.3 30.0 - 36.0 g/dL   RDW 86.4 88.4 - 84.4 %   Platelets 344 150 - 400 K/uL   nRBC 0.0 0.0 - 0.2 %   Neutrophils Relative % 75 %   Neutro Abs 11.9 (H)  1.7 - 7.7 K/uL   Lymphocytes Relative 8 %   Lymphs Abs 1.2 0.7 - 4.0 K/uL   Monocytes Relative 16 %   Monocytes Absolute 2.5 (H) 0.1 - 1.0 K/uL   Eosinophils Relative 0 %   Eosinophils Absolute 0.0 0.0 - 0.5 K/uL   Basophils Relative 0 %   Basophils Absolute 0.0 0.0 - 0.1 K/uL   Immature Granulocytes 1 %   Abs Immature Granulocytes 0.08 (H) 0.00 - 0.07 K/uL  Comprehensive metabolic panel with GFR     Status: Abnormal   Collection Time: 05/03/24  4:18 AM  Result Value Ref Range   Sodium 136 135 - 145 mmol/L   Potassium 4.6 3.5 - 5.1 mmol/L   Chloride 100 98 - 111 mmol/L   CO2 26 22 - 32 mmol/L   Glucose, Bld 126 (H) 70 - 99 mg/dL   BUN 6 6 - 20 mg/dL   Creatinine, Ser 9.35 0.44 - 1.00 mg/dL   Calcium  8.3 (L) 8.9 - 10.3 mg/dL   Total Protein 5.5 (L) 6.5 - 8.1 g/dL   Albumin 2.4 (L) 3.5 - 5.0 g/dL   AST 23 15 - 41 U/L   ALT 12 0 - 44 U/L   Alkaline Phosphatase 41 38 - 126 U/L   Total Bilirubin 0.5 0.0 - 1.2  mg/dL   GFR, Estimated >39 >39 mL/min   Anion gap 10 5 - 15  Magnesium      Status: None   Collection Time: 05/03/24  4:18 AM  Result Value Ref Range   Magnesium  2.1 1.7 - 2.4 mg/dL  Phosphorus     Status: None   Collection Time: 05/03/24  4:18 AM  Result Value Ref Range   Phosphorus 3.4 2.5 - 4.6 mg/dL  Prepare RBC (crossmatch)     Status: None   Collection Time: 05/03/24  5:14 AM  Result Value Ref Range   Order Confirmation      ORDER PROCESSED BY BLOOD BANK Performed at [facility], [address], [place], [place] [address]     ASSESSMENT: [name] is a 58 y.o. female, 1 Day Post-Op s/p fall Procedures: OPEN REDUCTION INTERNAL FIXATION (ORI LEFT TIBIAL PLATEAU REMOVAL EXTERNAL FIXATION DEVICE LEFT LOWER EXTREMITY  CV/Blood loss: Acute blood loss anemia, Hgb 6.6 this AM.  Receiving 1 unit PRBCs.  Hemodynamically stable  PLAN: Weightbearing: NWB LLE ROM: Unrestricted ROM.  Please do not place anything directly behind the knee at rest Incisional  and dressing care: Reinforce dressings as needed  Showering: Hold off on showering for now. Orthopedic device(s): None  Pain management:  1. Tylenol  650 mg q 6 hours scheduled 2.  Flexeril  10 mg 3 times daily PRN 3. Oxycodone  5-10 mg q 4 hours PRN 4. Morphine  1-2 mg q 2 hours PRN 5. Toradol  15 mg every 8 hours x 5 days VTE prophylaxis: Lovenox , SCDs ID:  Ancef  2gm post op Foley/Lines:  No foley, KVO IVFs Impediments to Fracture Healing: Vitamin D  level looks okay at 37.  No additional supplementation needed Dispo: PT/OT evaluation today following blood transfusion and repeat H&H, dispo pending.  Will plan to remove dressing LLE tomorrow 05/04/2024.  Continue to monitor CBC.    D/C recommendations: - Oxycodone , Flexeril , Tylenol  for pain control - Aspirin  325 mg daily x 30 days for DVT prophylaxis - No additional need for Vit D supplementation  Follow - up plan: 2 weeks after d/c for wound check and repeat x-rays   Contact information:  Franky Light MD, Lauraine Moores PA-C. After hours and holidays please check [email or web address] for group call information for [facility]   Lauraine PATRIC Moores, PA-C [phone] (office) [email or web address]

## 2024-05-03 NOTE — Progress Notes (Signed)
 Occupational Therapy Treatment Patient Details Name: [name] MRN: [id number] DOB: 04-Aug-1965 Today's Date: 05/03/2024   History of present illness Pt is a 58 y.o. female admitted 04/27/24 after a fall off a ~24ft step ladder hitting some concrete cinderblocks. Pt sustained a complex bicondylar left tibial plateau fracture, left third digit dip dislocation, left third digit middle phalanx and base fx, and possible left fifth proximal phalanx fx. Pt s/p closed reduction and external fixation application to LLE and I&D of left hand 11/7. LLE ORIF 11/10. PMHx: HLD, chronic anxiety, depression, HTN, osteoporosis, and current tobacco smoker.   OT comments  Pt was seen s/p removal of ex-fix and LLE ORIF and is making great progress towards their acute OT goals. Pt remains eager to participate and mobilize, she reported 7/10 pain but tolerated all movement well. Overall she did not need physical assist for bed mobility, transfers, functional mobility or ADLs. CGA given for safety. She maintained LLE NWB very well. OT to continue to follow acutely to facilitate progress towards established goals. Pt will continue to benefit from discharge without the need for follow up OT.       If plan is discharge home, recommend the following:  A little help with walking and/or transfers;A lot of help with bathing/dressing/bathroom;Assistance with cooking/housework;Assist for transportation   Equipment Recommendations  Tub/shower seat       Precautions / Restrictions Precautions Precautions: Fall Recall of Precautions/Restrictions: Intact Restrictions Weight Bearing Restrictions Per Provider Order: Yes LUE Weight Bearing Per Provider Order: Weight bearing as tolerated LLE Weight Bearing Per Provider Order: Non weight bearing       Mobility Bed Mobility Overal bed mobility: Needs Assistance Bed Mobility: Supine to Sit     Supine to sit: Supervision          Transfers Overall transfer level:  Needs assistance Equipment used: Rolling walker (2 wheels) Transfers: Sit to/from Stand Sit to Stand: Contact guard assist             Balance Overall balance assessment: Needs assistance Sitting-balance support: No upper extremity supported, Feet supported Sitting balance-Leahy Scale: Fair     Standing balance support: During functional activity, Single extremity supported Standing balance-Leahy Scale: Poor Standing balance comment: pt can stand statically with 1 leg and 1 UE supported for ADLs, CGA for safety                           ADL either performed or assessed with clinical judgement   ADL Overall ADL's : Needs assistance/impaired Eating/Feeding: Independent   Grooming: Set up;Sitting   Upper Body Bathing: Set up;Sitting   Lower Body Bathing: Contact guard assist;Sit to/from stand   Upper Body Dressing : Set up;Sitting   Lower Body Dressing: Contact guard assist;Sit to/from stand   Toilet Transfer: Contact guard assist;Ambulation;Regular Toilet;Rolling walker (2 wheels)   Toileting- Clothing Manipulation and Hygiene: Modified independent;Sitting/lateral lean       Functional mobility during ADLs: Contact guard assist;Rolling walker (2 wheels) General ADL Comments: CGA for safety when pt is completing standing ADLs without BUE support    Extremity/Trunk Assessment Upper Extremity Assessment Upper Extremity Assessment: LUE deficits/detail LUE Deficits / Details: known 3rd and 5th digit fractures - WBAT, pt is using hand functionally. denies significant pain   Lower Extremity Assessment Lower Extremity Assessment: Overall WFL for tasks assessed        Vision   Vision Assessment?: No apparent visual deficits Additional Comments: with  glasses   Perception Perception Perception: Within Functional Limits   Praxis Praxis Praxis: WFL   Communication Communication Communication: No apparent difficulties   Cognition Arousal: Alert Behavior  During Therapy: WFL for tasks assessed/performed Cognition: No apparent impairments             OT - Cognition Comments: Can be impulsive with movement but overall WFL                 Following commands: Intact        Cueing   Cueing Techniques: Verbal cues        General Comments VSS on RA    Pertinent Vitals/ Pain       Pain Assessment Pain Assessment: 0-10 Pain Score: 7  Pain Location: LLE Pain Descriptors / Indicators: Grimacing, Discomfort, Aching Pain Intervention(s): Limited activity within patient's tolerance, Monitored during session   Frequency  Min 2X/week        Progress Toward Goals  OT Goals(current goals can now be found in the care plan section)  Progress towards OT goals: Progressing toward goals  Acute Rehab OT Goals Patient Stated Goal: home OT Goal Formulation: With patient Time For Goal Achievement: 05/14/24 Potential to Achieve Goals: Good  Plan         AM-PAC OT 6 Clicks Daily Activity     Outcome Measure   Help from another person eating meals?: None Help from another person taking care of personal grooming?: A Little Help from another person toileting, which includes using toliet, bedpan, or urinal?: A Little Help from another person bathing (including washing, rinsing, drying)?: A Little Help from another person to put on and taking off regular upper body clothing?: A Little Help from another person to put on and taking off regular lower body clothing?: A Little 6 Click Score: 19    End of Session Equipment Utilized During Treatment: Rolling walker (2 wheels)  OT Visit Diagnosis: Unsteadiness on feet (R26.81);Other abnormalities of gait and mobility (R26.89);Muscle weakness (generalized) (M62.81);Pain   Activity Tolerance Patient tolerated treatment well   Patient Left in chair;with call bell/phone within reach;with chair alarm set   Nurse Communication Mobility status        Time: 8881-8862 OT Time  Calculation (min): 19 min  Charges: OT General Charges $OT Visit: 1 Visit OT Treatments $Self Care/Home Management : 8-22 mins  Lucie Kendall, OTR/L Acute Rehabilitation Services Office [phone] Secure Chat Communication Preferred   Lucie JONETTA Kendall 05/03/2024, 12:36 PM

## 2024-05-03 NOTE — Progress Notes (Signed)
 Physical Therapy Treatment Patient Details Name: [name] MRN: [id number] DOB: May 23, 1966 Today's Date: 05/03/2024   History of Present Illness Pt is a 58 y.o. female admitted 04/27/24 after a fall off a ~48ft step ladder hitting some concrete cinderblocks. Pt sustained a complex bicondylar left tibial plateau fracture, left third digit dip dislocation, left third digit middle phalanx and base fx, and possible left fifth proximal phalanx fx. Pt s/p closed reduction and external fixation application to LLE and I&D of left hand 11/7. LLE ORIF 11/10. PMHx: HLD, chronic anxiety, depression, HTN, osteoporosis, and current tobacco smoker.    PT Comments  Pt is progressing well towards goals. No significant change in functional status since surgery. Currently pt is CGA to supervision for sit to stand and short distance gait. Pt is Min A for bed mobility. Goals remain appropriate. Recommending to follow physician recommendation for physical therapy once discharged from acute care hospital setting.       If plan is discharge home, recommend the following: A little help with walking and/or transfers;A little help with bathing/dressing/bathroom;Assistance with cooking/housework;Assist for transportation;Help with stairs or ramp for entrance     Equipment Recommendations  Wheelchair (measurements PT);Wheelchair cushion (measurements PT) (L elevating leg rest)       Precautions / Restrictions Precautions Precautions: Fall Recall of Precautions/Restrictions: Intact Precaution/Restrictions Comments: Ex-Fix on LLE Splint/Cast - Date Prophylactic Dressing Applied (if applicable): 04/27/24 Restrictions Weight Bearing Restrictions Per Provider Order: Yes LUE Weight Bearing Per Provider Order: Weight bearing as tolerated LLE Weight Bearing Per Provider Order: Non weight bearing     Mobility  Bed Mobility Overal bed mobility: Needs Assistance Bed Mobility: Supine to Sit, Sit to Supine     Supine to  sit: Min assist Sit to supine: Min assist   General bed mobility comments: Min A with LLE    Transfers Overall transfer level: Needs assistance Equipment used: Rolling walker (2 wheels) Transfers: Sit to/from Stand, Bed to chair/wheelchair/BSC Sit to Stand: Contact guard assist   Step pivot transfers: Contact guard assist       General transfer comment: CGA for safety. Pt able to maintain precautions well. Pt performed 3x from recliner, toilet and EOB    Ambulation/Gait Ambulation/Gait assistance: Contact guard assist, Supervision Gait Distance (Feet): 20 Feet Assistive device: Rolling walker (2 wheels) Gait Pattern/deviations: Step-to pattern Gait velocity: decreased Gait velocity interpretation: <1.31 ft/sec, indicative of household ambulator   General Gait Details: Pt ambulated using a hopping technique to maintain LLE NWB at all times. Good floor clearance, progressed to supervision     Balance Overall balance assessment: Needs assistance Sitting-balance support: No upper extremity supported, Feet supported Sitting balance-Leahy Scale: Normal     Standing balance support: During functional activity, Single extremity supported Standing balance-Leahy Scale: Fair Standing balance comment: pt can stand statically with 1 leg and 1 UE supported for ADLs, CGA for safety      Communication Communication Communication: No apparent difficulties  Cognition Arousal: Alert Behavior During Therapy: WFL for tasks assessed/performed   PT - Cognitive impairments: No apparent impairments   PT - Cognition Comments: Pt A,Ox4 Following commands: Intact      Cueing Cueing Techniques: Verbal cues     General Comments General comments (skin integrity, edema, etc.): no signs/symptoms of cardiac/respiratory distress during activity      Pertinent Vitals/Pain Pain Assessment Pain Assessment: Faces Faces Pain Scale: Hurts little more Breathing: normal Negative Vocalization:  none Facial Expression: smiling or inexpressive Body Language: relaxed Consolability:  no need to console PAINAD Score: 0 Pain Descriptors / Indicators: Grimacing, Discomfort, Aching Pain Intervention(s): Limited activity within patient's tolerance, Monitored during session     PT Goals (current goals can now be found in the care plan section) Acute Rehab PT Goals Patient Stated Goal: Maximize independence and return Home PT Goal Formulation: With patient Time For Goal Achievement: 05/14/24 Potential to Achieve Goals: Good    Frequency    Min 2X/week      PT Plan  Continue with current POC        AM-PAC PT 6 Clicks Mobility   Outcome Measure  Help needed turning from your back to your side while in a flat bed without using bedrails?: A Little Help needed moving from lying on your back to sitting on the side of a flat bed without using bedrails?: A Little Help needed moving to and from a bed to a chair (including a wheelchair)?: A Little Help needed standing up from a chair using your arms (e.g., wheelchair or bedside chair)?: A Little Help needed to walk in hospital room?: A Little Help needed climbing 3-5 steps with a railing? : A Lot 6 Click Score: 17    End of Session Equipment Utilized During Treatment: Gait belt Activity Tolerance: Patient tolerated treatment well Patient left: with call bell/phone within reach;in bed Nurse Communication: Mobility status;Patient requests pain meds PT Visit Diagnosis: Difficulty in walking, not elsewhere classified (R26.2);Other abnormalities of gait and mobility (R26.89);Unsteadiness on feet (R26.81)     Time: 8687-8662 PT Time Calculation (min) (ACUTE ONLY): 25 min  Charges:    $Therapeutic Activity: 23-37 mins PT General Charges $$ ACUTE PT VISIT: 1 Visit                     Dorothyann Maier, DPT, CLT  Acute Rehabilitation Services Office: [phone] (Secure chat preferred)    Dorothyann VEAR Maier 05/03/2024,  2:56 PM

## 2024-05-03 NOTE — Anesthesia Postprocedure Evaluation (Signed)
 Anesthesia Post Note  Patient: [name]  Procedure(s) Performed: OPEN REDUCTION INTERNAL FIXATION (ORIF) TIBIAL PLATEAU (Left: Leg Lower) REMOVAL, EXTERNAL FIXATION DEVICE, LOWER EXTREMITY (Left: Leg Lower)     Patient location during evaluation: PACU Anesthesia Type: General Level of consciousness: awake and alert Pain management: pain level controlled Vital Signs Assessment: post-procedure vital signs reviewed and stable Respiratory status: spontaneous breathing, nonlabored ventilation, respiratory function stable and patient connected to nasal cannula oxygen Cardiovascular status: blood pressure returned to baseline and stable Postop Assessment: no apparent nausea or vomiting Anesthetic complications: no   No notable events documented.  Last Vitals:  Vitals:   05/03/24 0812 05/03/24 0953  BP: 121/73 (!) 146/77  Pulse: (!) 105 (!) 111  Resp: 16 18  Temp: 36.5 C 36.7 C  SpO2: 100% 100%    Last Pain:  Vitals:   05/03/24 0953  TempSrc: Oral  PainSc:                  Adithi Gammon L Jacee Enerson

## 2024-05-04 DIAGNOSIS — W19XXXA Unspecified fall, initial encounter: Secondary | ICD-10-CM | POA: Diagnosis not present

## 2024-05-04 DIAGNOSIS — S82142A Displaced bicondylar fracture of left tibia, initial encounter for closed fracture: Secondary | ICD-10-CM | POA: Diagnosis not present

## 2024-05-04 DIAGNOSIS — K5900 Constipation, unspecified: Secondary | ICD-10-CM | POA: Diagnosis not present

## 2024-05-04 DIAGNOSIS — D509 Iron deficiency anemia, unspecified: Secondary | ICD-10-CM | POA: Diagnosis not present

## 2024-05-04 LAB — COMPREHENSIVE METABOLIC PANEL WITH GFR
ALT: 14 U/L (ref 0–44)
AST: 27 U/L (ref 15–41)
Albumin: 2.6 g/dL — ABNORMAL LOW (ref 3.5–5.0)
Alkaline Phosphatase: 49 U/L (ref 38–126)
Anion gap: 11 (ref 5–15)
BUN: 7 mg/dL (ref 6–20)
CO2: 26 mmol/L (ref 22–32)
Calcium: 8.2 mg/dL — ABNORMAL LOW (ref 8.9–10.3)
Chloride: 101 mmol/L (ref 98–111)
Creatinine, Ser: 0.68 mg/dL (ref 0.44–1.00)
GFR, Estimated: >60 mL/min (ref >60)
Glucose, Bld: 93 mg/dL (ref 70–99)
Potassium: 4 mmol/L (ref 3.5–5.1)
Sodium: 138 mmol/L (ref 135–145)
Total Bilirubin: 0.9 mg/dL (ref 0.0–1.2)
Total Protein: 5.9 g/dL — ABNORMAL LOW (ref 6.5–8.1)

## 2024-05-04 LAB — TYPE AND SCREEN
ABO/RH(D): O POS
Antibody Screen: NEGATIVE
Unit division: 0

## 2024-05-04 LAB — BPAM RBC
Blood Product Expiration Date: 202511152359
ISSUE DATE / TIME: 202511110640
Unit Type and Rh: 5100

## 2024-05-04 LAB — CBC WITH DIFFERENTIAL/PLATELET
Abs Immature Granulocytes: 0.26 K/uL — ABNORMAL HIGH (ref 0.00–0.07)
Basophils Absolute: 0.1 K/uL (ref 0.0–0.1)
Basophils Relative: 0 %
Eosinophils Absolute: 0.7 K/uL — ABNORMAL HIGH (ref 0.0–0.5)
Eosinophils Relative: 5 %
HCT: 26.9 % — ABNORMAL LOW (ref 36.0–46.0)
Hemoglobin: 9 g/dL — ABNORMAL LOW (ref 12.0–15.0)
Immature Granulocytes: 2 %
Lymphocytes Relative: 22 %
Lymphs Abs: 3.1 K/uL (ref 0.7–4.0)
MCH: 32.5 pg (ref 26.0–34.0)
MCHC: 33.5 g/dL (ref 30.0–36.0)
MCV: 97.1 fL (ref 80.0–100.0)
Monocytes Absolute: 2.2 K/uL — ABNORMAL HIGH (ref 0.1–1.0)
Monocytes Relative: 16 %
Neutro Abs: 8 K/uL — ABNORMAL HIGH (ref 1.7–7.7)
Neutrophils Relative %: 55 %
Platelets: 442 K/uL — ABNORMAL HIGH (ref 150–400)
RBC: 2.77 MIL/uL — ABNORMAL LOW (ref 3.87–5.11)
RDW: 14.6 % (ref 11.5–15.5)
WBC: 14.3 K/uL — ABNORMAL HIGH (ref 4.0–10.5)
nRBC: 0 % (ref 0.0–0.2)

## 2024-05-04 LAB — PHOSPHORUS: Phosphorus: 3 mg/dL (ref 2.5–4.6)

## 2024-05-04 LAB — MAGNESIUM: Magnesium: 1.8 mg/dL (ref 1.7–2.4)

## 2024-05-04 MED ORDER — ASPIRIN 325 MG PO TBEC: 325.0000 mg | DELAYED_RELEASE_TABLET | Freq: Every day | ORAL | 0 refills | Status: AC

## 2024-05-04 MED ORDER — MAGNESIUM SULFATE 2 GM/50ML IV SOLN
2.0000 g | Freq: Once | INTRAVENOUS | Status: AC
  Administered 2024-05-04: 2 g via INTRAVENOUS
  Filled 2024-05-04: qty 50

## 2024-05-04 MED ORDER — OXYCODONE HCL 5 MG PO TABS
5.0000 mg | ORAL_TABLET | ORAL | 0 refills | Status: DC | PRN
Start: 2024-05-04 — End: 2024-05-10

## 2024-05-04 NOTE — TOC Progression Note (Signed)
 Transition of Care [facility]) - Progression Note    Patient Details  Name: [name] MRN: [id number] Date of Birth: Mar 05, 1966  [facility] of [facility] CM/SW Contact  Rosalva Jon Bloch, RN Phone Number: 05/04/2024, 2:49 PM  Clinical Narrative:        -  Left tibial plateau fracture  RNCM spoke with patient regarding SNF placement need at time of discharge. Patient reported that she is currently unable to care for self independently at home given patient's current physical needs and fall risk. Pt is agreeable to SNF placement at time of discharge. Patient reports preference for  [facility] . RNCM discussed insurance authorization process. Patient expressed being hopeful for rehab and to feel better soon. No further questions reported at this time. IP CM team will continue following  and assist with discharge planning needs.   Expected Discharge Plan: Skilled Nursing Facility Barriers to Discharge: Continued Medical Work up               Expected Discharge Plan and Services                                               Social Drivers of Health (SDOH) Interventions SDOH Screenings   Food Insecurity: No Food Insecurity (04/28/2024)  Housing: Low Risk  (04/28/2024)  Transportation Needs: No Transportation Needs (04/28/2024)  Utilities: Not At Risk (04/28/2024)  Depression (PHQ2-9): Medium Risk (04/03/2023)  Social Connections: Unknown (11/04/2021)   Received from [facility]  Tobacco Use: Medium Risk (04/29/2024)    Readmission Risk Interventions     No data to display

## 2024-05-04 NOTE — Plan of Care (Signed)
  Problem: Education: Goal: Knowledge of General Education information will improve Description: Including pain rating scale, medication(s)/side effects and non-pharmacologic comfort measures Outcome: Progressing   Problem: Health Behavior/Discharge Planning: Goal: Ability to manage health-related needs will improve Outcome: Progressing   Problem: Activity: Goal: Risk for activity intolerance will decrease Outcome: Progressing   Problem: Nutrition: Goal: Adequate nutrition will be maintained Outcome: Progressing   Problem: Coping: Goal: Level of anxiety will decrease Outcome: Progressing   Problem: Pain Managment: Goal: General experience of comfort will improve and/or be controlled Outcome: Progressing   Problem: Safety: Goal: Ability to remain free from injury will improve Outcome: Progressing

## 2024-05-04 NOTE — Progress Notes (Signed)
 Mobility Specialist Progress Note:    05/04/24 0949  Mobility  Activity Ambulated with assistance (In hallway)  Level of Assistance Contact guard assist, steadying assist  Assistive Device Front wheel walker  Distance Ambulated (ft) 20 ft (x3)  LUE Weight Bearing Per Provider Order WBAT  LLE Weight Bearing Per Provider Order NWB  Activity Response Tolerated well  Mobility Referral Yes  Mobility visit 1 Mobility  Mobility Specialist Start Time (ACUTE ONLY) [id number]  Mobility Specialist Stop Time (ACUTE ONLY) 0949  Mobility Specialist Time Calculation (min) (ACUTE ONLY) 25 min   Received pt in BR and agreeable to mobility. Pt required MinG for safety. Pt had x2 seated breaks d/t fatigue. Pt c/o LLE pain, otherwise tolerated well. Returned to room without fault. Left pt in bed with alarm on. Personal belongings and call light within reach. All needs met.  Lavanda Pollack Mobility Specialist  Please contact via [employer] or  Rehab Office [phone]

## 2024-05-04 NOTE — NC FL2 (Signed)
 [place]  MEDICAID FL2 LEVEL OF CARE FORM     IDENTIFICATION  Patient Name: [name] Birthdate: July 23, 1965 Sex: female Admission Date (Current Location): 04/27/2024  [place] and [place] Number:  [place] [id number] O Facility and Address:  [facility] [place], [address], [place], [place] [address]      Provider Number: [id number]  Attending Physician Name and Address:  Sherrill Alejandro Donovan, DO  Relative Name and Phone Number:  Etter, Royall Sister [phone]    Current Level of Care: Hospital Recommended Level of Care: Skilled Nursing Facility Prior Approval Number:    Date Approved/Denied:   PASRR Number:    Discharge Plan: SNF    Current Diagnoses: Patient Active Problem List   Diagnosis Date Noted   Fall at home, initial encounter 05/02/2024   Closed fracture of left tibial plateau, initial encounter 04/27/2024   Depression, major, single episode, mild 06/22/2022   Hypokalemia 06/22/2022   Allergic rhinitis 06/22/2022   Chronic back pain 04/21/2017   Tobacco abuse 10/20/2016   Osteoporosis 04/23/2016   Anemia, iron deficiency 10/16/2015   Constipation 10/16/2015   H/O fracture of left hip    Patella fracture 12/28/2014   GAD (generalized anxiety disorder) 03/06/2014   HLD (hyperlipidemia) 03/06/2014   Essential hypertension, benign 03/06/2014    Orientation RESPIRATION BLADDER Height & Weight     Self, Time, Situation, Place  Normal Continent Weight: 127 lb (57.6 kg) Height:  5' 5 (165.1 cm)  BEHAVIORAL SYMPTOMS/MOOD NEUROLOGICAL BOWEL NUTRITION STATUS      Continent Diet (see discharge summary)  AMBULATORY STATUS COMMUNICATION OF NEEDS Skin   Limited Assist Verbally Surgical wounds                       Personal Care Assistance Level of Assistance  Bathing, Feeding, Dressing Bathing Assistance: Limited assistance Feeding assistance: Independent Dressing Assistance: Limited assistance     Functional Limitations  Info  Sight, Hearing, Speech Sight Info: Adequate Hearing Info: Adequate Speech Info: Adequate    SPECIAL CARE FACTORS FREQUENCY  PT (By licensed PT), OT (By licensed OT)     PT Frequency: 5x week OT Frequency: 5x week            Contractures Contractures Info: Not present    Additional Factors Info  Code Status, Allergies Code Status Info: full Allergies Info: Codeine, Hydrocodone           Current Medications (05/04/2024):  This is the current hospital active medication list Current Facility-Administered Medications  Medication Dose Route Frequency Provider Last Rate Last Admin   0.9 %  sodium chloride  infusion (Manually program via Guardrails IV Fluids)   Intravenous Once Danton Lauraine [name], PA-C       acetaminophen  (TYLENOL ) tablet 650 mg  650 mg Oral Q6H Danton Lauraine [name], PA-C   650 mg at 05/04/24 1121   bisacodyl (DULCOLAX) suppository 10 mg  10 mg Rectal Daily PRN Danton Lauraine A, PA-C       calcium -vitamin D  (OSCAL WITH D) 500-5 MG-MCG per tablet 1 tablet  1 tablet Oral Q breakfast Danton Lauraine [name], PA-C   1 tablet at 05/04/24 0813   cyclobenzaprine  (FLEXERIL ) tablet 10 mg  10 mg Oral TID PRN Danton Lauraine [name], PA-C   10 mg at 05/04/24 1535   DULoxetine  (CYMBALTA ) DR capsule 60 mg  60 mg Oral Daily Danton Lauraine A, PA-C   60 mg at 05/04/24 0813   enoxaparin  (LOVENOX ) injection 40 mg  40 mg Subcutaneous Q24H Danton Lauraine LABOR, PA-C   40 mg at 05/04/24 0813   hydrOXYzine  (ATARAX ) tablet 25 mg  25 mg Oral Q6H PRN Danton Lauraine LABOR, PA-C   25 mg at 05/03/24 2201   loratadine  (CLARITIN ) tablet 10 mg  10 mg Oral Daily Danton Lauraine LABOR, PA-C   10 mg at 05/04/24 0813   melatonin tablet 3 mg  3 mg Oral QHS PRN Danton Lauraine LABOR, PA-C   3 mg at 05/03/24 2156   morphine  (PF) 2 MG/ML injection 1-2 mg  1-2 mg Intravenous Q2H PRN Danton Lauraine LABOR, PA-C   2 mg at 05/04/24 9675   mupirocin  ointment (BACTROBAN ) 2 %   Nasal BID Danton Lauraine LABOR, PA-C   Given at 05/04/24 9177   nicotine   (NICODERM CQ  - dosed in mg/24 hours) patch 21 mg  21 mg Transdermal Daily Danton Lauraine LABOR, PA-C   21 mg at 05/04/24 0820   oxyCODONE  (Oxy IR/ROXICODONE ) immediate release tablet 5-10 mg  5-10 mg Oral Q4H PRN Danton Lauraine LABOR, PA-C   10 mg at 05/04/24 1532   pneumococcal 20-valent conjugate vaccine (PREVNAR 20) injection 0.5 mL  0.5 mL Intramuscular Tomorrow-1000 McClung, Sarah A, PA-C       polyethylene glycol (MIRALAX  / GLYCOLAX ) packet 17 g  17 g Oral BID Danton Lauraine LABOR, PA-C   17 g at 05/04/24 9177   polyethylene glycol (MIRALAX  / GLYCOLAX ) packet 17 g  17 g Oral Daily PRN Danton Lauraine LABOR, PA-C       prochlorperazine (COMPAZINE) injection 10 mg  10 mg Intravenous Q4H PRN Danton Lauraine LABOR, PA-C   10 mg at 05/04/24 1103   senna-docusate (Senokot-S) tablet 1 tablet  1 tablet Oral BID Danton Lauraine LABOR, PA-C   1 tablet at 05/04/24 0813   simvastatin  (ZOCOR ) tablet 20 mg  20 mg Oral [id number] Danton Lauraine LABOR, PA-C   20 mg at 05/03/24 1811     Discharge Medications: Please see discharge summary for a list of discharge medications.  Relevant Imaging Results:  Relevant Lab Results:   Additional Information SSN: [id number]  Bridget Cordella Simmonds, LCSW

## 2024-05-04 NOTE — Progress Notes (Signed)
 Orthopaedic Trauma Progress Note  SUBJECTIVE: Patient doing fairly well this morning.  Pain controlled.  Feels like her therapy session went well yesterday. No chest pain. No SOB. No nausea/vomiting. No other complaints.  Tolerating diet and fluids.  No other specific issues of note.  Patient would like to pursue rehab facility at discharge.  OBJECTIVE:  Vitals:   05/04/24 0412 05/04/24 0720  BP: 125/82 111/88  Pulse: 97 (!) 101  Resp: 18 17  Temp: 98.1 F (36.7 C) 98 F (36.7 C)  SpO2: 98% 99%    Opiates Today (MME): Today's  total administered Morphine  Milligram Equivalents: 36 Opiates Yesterday (MME): Yesterday's total administered Morphine  Milligram Equivalents: 48  General: Sitting up in bed, no acute distress.  Pleasant and cooperative Respiratory: No increased work of breathing.  Operative Extremity (left lower extremity): Dressing with mild amount of bloody serosanguineous drainage on the anterior portion.  Dressing removed, all incisions are clean, dry, intact.  New dressing applied.  Tenderness about the knee and proximal tibia as expected.  No significant tenderness throughout the calf.  Nontender through the thigh.  Ankle DF/PF intact.  Able to wiggle toes.  Endorses sensation light touch over all aspects of the foot.  2+ DP pulse.  IMAGING: Stable post op imaging left knee  LABS:  Results for orders placed or performed during the hospital encounter of 04/27/24 (from the past 24 hours)  Hemoglobin and hematocrit, blood     Status: Abnormal   Collection Time: 05/03/24 12:07 PM  Result Value Ref Range   Hemoglobin 8.6 (L) 12.0 - 15.0 g/dL   HCT 74.4 (L) 63.9 - 53.9 %  CBC with Differential/Platelet     Status: Abnormal   Collection Time: 05/04/24  9:23 AM  Result Value Ref Range   WBC 14.3 (H) 4.0 - 10.5 K/uL   RBC 2.77 (L) 3.87 - 5.11 MIL/uL   Hemoglobin 9.0 (L) 12.0 - 15.0 g/dL   HCT 73.0 (L) 63.9 - 53.9 %   MCV 97.1 80.0 - 100.0 fL   MCH 32.5 26.0 - 34.0 pg    MCHC 33.5 30.0 - 36.0 g/dL   RDW 85.3 88.4 - 84.4 %   Platelets 442 (H) 150 - 400 K/uL   nRBC 0.0 0.0 - 0.2 %   Neutrophils Relative % 55 %   Neutro Abs 8.0 (H) 1.7 - 7.7 K/uL   Lymphocytes Relative 22 %   Lymphs Abs 3.1 0.7 - 4.0 K/uL   Monocytes Relative 16 %   Monocytes Absolute 2.2 (H) 0.1 - 1.0 K/uL   Eosinophils Relative 5 %   Eosinophils Absolute 0.7 (H) 0.0 - 0.5 K/uL   Basophils Relative 0 %   Basophils Absolute 0.1 0.0 - 0.1 K/uL   Immature Granulocytes 2 %   Abs Immature Granulocytes 0.26 (H) 0.00 - 0.07 K/uL    ASSESSMENT: [name] is a 58 y.o. female, 2 Days Post-Op s/p fall Procedures: OPEN REDUCTION INTERNAL FIXATION (ORI LEFT TIBIAL PLATEAU REMOVAL EXTERNAL FIXATION DEVICE LEFT LOWER EXTREMITY  CV/Blood loss: Acute blood loss anemia, Hgb 9.0 this AM.  Received 1 unit PRBCs on 05/03/24, responded well to this.  Hemodynamically stable  PLAN: Weightbearing: NWB LLE ROM: Unrestricted ROM.  Please do not place anything directly behind the knee at rest Incisional and dressing care: Dressing changed today.  Continue to change dressing daily as needed starting 05/05/2024 Showering: Okay to begin getting incisions wet starting 05/07/2024 if no drainage from incisions Orthopedic device(s): None  Pain management:  1. Tylenol  650 mg q 6 hours scheduled 2. Flexeril  10 mg 3 times daily PRN 3. Oxycodone  5-10 mg q 4 hours PRN 4. Morphine  1-2 mg q 2 hours PRN 5. Toradol  15 mg every 8 hours x 5 days VTE prophylaxis: Lovenox , SCDs ID:  Ancef  2gm post op completed Foley/Lines:  No foley, KVO IVFs Impediments to Fracture Healing: Vitamin D  level looks okay at 37.  No additional supplementation needed Dispo: PT/OT evaluation ongoing.  Patient okay for discharge from Ortho standpoint once cleared by medicine team and therapies.    D/C recommendations: - Oxycodone , Flexeril , Tylenol  for pain control - Aspirin  325 mg daily x 30 days for DVT prophylaxis - No additional need  for Vit D supplementation  Follow - up plan: 2 weeks after d/c for wound check and repeat x-rays   Contact information:  Franky Light MD, Lauraine Moores PA-C. After hours and holidays please check [email or web address] for group call information for [facility]   Lauraine PATRIC Moores, PA-C [phone] (office) [email or web address]

## 2024-05-04 NOTE — Plan of Care (Signed)

## 2024-05-04 NOTE — Progress Notes (Signed)
 PROGRESS NOTE    [name]  FMW:[id number] DOB: 1965/08/10 DOA: 04/27/2024 PCP: Lavell Bari [name], FNP   Brief Narrative:  The patient is 58 y.o. female with medical history significant for hyperlipidemia, chronic anxiety, osteoporosis, current tobacco smoker, who presents to the ER via EMS from home after a mechanical fall. The patient was on a stepstool, taking down her halloween decorations when she lost her balance and fell.    In the ER, she was noted to have deformity and laceration to her left middle finger with fracture.  Imaging revealed comminuted proximal tibia and fibula fracture.  No signs of compartment syndrome.  EDP discussed the case with orthopedic surgery, Dr. Onesimo, who recommended admission and transfer to [facility] for possible orthopedic surgical intervention.   Currently she is undergone a knee external fixator and underwent external fixation 11/7.  On 11/10 she is going for ORIF.  Hemoglobin has trended down and she was typed and screened and transfused 1 unit PRBCs on 05/03/2024.  Patient would like to pursue rehab facility at discharge.  She is improved and stabilized from a medical standpoint.  Assessment and Plan:  Closed Comminuted fracture of left tibia plateau and Closed comminuted fracture of the Left Fibular Head/Neck: Secondary to trauma from fall and noted on x-ray/CT imaging. Complicated by underlying osteoporosis. Orthopedic surgery consulted and performed external fixation of the left leg/tibia and closed reduction of the left tibial plateau fracture on 11/7;  Now she is being taken to the OR for ORIF on 05/02/24 of the Left Bicondylar Tibial Plateau Fx and had her Left Spanning Knee External Fixation removed. -Recommendation for nonweightbearing and Lovenox  per orthopedic surgery and aggressive ice and elevation for swelling control.  Per orthopedic surgery if the surgery will be more than 10 days out that she may benefit from getting footplate  from OT to help maintain neutral ankle position -Continue pain control with p.o. acetaminophen  650 mg Q6 scheduled, IV ketorolac  Q8 for 5 days, p.o. oxycodone  5 to 10 mg p.o. every 4 as needed for moderate pain and severe pain and IV morphine  1 to 2 mg Q2 as needed for breakthrough pain -Bowel regimen with bisacodyl 10 mg RC daily as needed for moderate constipation, MiraLAX  17 g p.o. twice daily and senna docusate 1 tab p.o. twice daily; -PT/OT recommendations: Initially had recommended no follow-up before her ORIF but does not think she can go home in her current condition and wants to pursue rehab facility for discharge.  TOC consulted for assistance with discharge disposition.  Fall: Secondary to loss of balance and fall off a Step stool. S/p ORIF and PT/OT evalauting and is now NWB as tolerated   Open Left Third Digit Fracture: Secondary to fall. Cefazolin  x3 doses for antibiotic coverage. Orthopedic surgery consulted and appreciate further reccs. Fingers redressed.   Leukocytosis: Fluctuating. Likely reactive and secondary to trauma and fracture and now surgical intervention. WBC Trend slowly trending down now and went 12.8 -> 16.3 -> 12.4 -> 14.2 -> 14.4 -> 15.2 -> 15.7 -> 14.3. No fevers or symptoms for infection. Patient does have an open fracture, however. Antibiotics initiated as a Preoperative precaution. .   Generalized Anxiety Disorder: Continue Duloxetine  60 mg po Daily    Hyperlipidemia: Continue Simvastatin  20 mg po daily.   Constipation: See Bowel regimen as above; Still has not had a BM and if necessary will give her an Enema. Will give her a Bisacodyl 10 mg x1  Hyponatremia: Na+ Trend  went from 135 -> 133 -> 134 -> 136 -> 138. CTM and Trend and repeat CMP in the AM.   Osteoporosis: Continue Calcium -Vitamin D  1 tab po Daily w/ Breakfast   Tobacco Use: Smoking Cessation recommended and given. Nicotine  patch 21 mg TD ordered and being continued   Acute Blood Loss Anemia /  Normocytic Anemia: Baseline hemoglobin appears to be around 12-13. Hemoglobin of 11.8 on admission with drift downward. Postoperatively, hemoglobin trended down and was 6.6 this AM. Hgb/Hct now improved and is 9.0/26.9. Was going to Check Anemia Panel but now received a unit of blood yesterday AM. CTM For S/Sx of Bleeding; No overt bleeding noted. Continue to Trend CBC daily and transfuse for Hgb < 7.0.   Thrombocytosis: Likely reactive as patient's platelet count has gone from 344 -> 442. CTM for S/Sx of Bleeding; no overt bleeding noted. Repeat CBC in the AM  Hypoalbuminemia: Patient's Albumin Lvl is now 2.6. CTM & Trend & repeat CMP in the AM    DVT prophylaxis: SCDs Start: 05/02/24 1210 enoxaparin  (LOVENOX ) injection 40 mg Start: 04/30/24 0800 SCDs Start: 04/29/24 1742    Code Status: Full Code Family Communication: No family currently at bedside  Disposition Plan:  Level of care: Med-Surg Status is: Inpatient Remains inpatient appropriate because:SABRA  Medically stable for discharge and will need SNF for discharge and awaiting insurance authorization and bed availability   Consultants:  Orthopedic Surgery  Procedures:   Procedures by Dr. Kendal on 04/29/24: CPT 20690-External fixation of left leg/tibia CPT 27532-Closed reduction of left tibial plateau fracture CPT 15852-Dressing change to left hand   Procedures by Dr. Kendal on 05/02/24: CPT 27536-Open reduction internal fixation of left bicondylar tibial plateau fracture CPT 20694-Removal of left knee external fixation  Antimicrobials:  Anti-infectives (From admission, onward)    Start     Dose/Rate Route Frequency Ordered Stop   05/02/24 1400  ceFAZolin  (ANCEF ) IVPB 2g/100 mL premix        2 g 200 mL/hr over 30 Minutes Intravenous Every 8 hours 05/02/24 1209 05/03/24 1137   05/02/24 1032  vancomycin (VANCOCIN) powder  Status:  Discontinued          As needed 05/02/24 1032 05/02/24 1113   04/29/24 2200  ceFAZolin  (ANCEF )  IVPB 2g/100 mL premix        2 g 200 mL/hr over 30 Minutes Intravenous Every 8 hours 04/29/24 1742 04/30/24 1406   04/29/24 0600  ceFAZolin  (ANCEF ) IVPB 2g/100 mL premix        2 g 200 mL/hr over 30 Minutes Intravenous On call to O.R. 04/28/24 1743 04/29/24 1549   04/27/24 2200  ceFAZolin  (ANCEF ) IVPB 2g/100 mL premix  Status:  Discontinued        2 g 200 mL/hr over 30 Minutes Intravenous Every 8 hours 04/27/24 2059 04/29/24 1742   04/27/24 1900  ceFAZolin  (ANCEF ) IVPB 1 g/50 mL premix  Status:  Discontinued        2 g 200 mL/hr over 30 Minutes Intravenous  Once 04/27/24 1855 04/27/24 2159       Subjective: Seen and examined at bedside and was doing fairly well finally had a bowel movement.  Denied any lightheadedness or dizziness.  No nausea or vomiting.  Still having some pain.  No other concerns or complaints at this time and feels that she cannot manage at home and would like to go to facility for rehabilitation.  Objective: Vitals:   05/03/24 1946 05/04/24 9587 05/04/24 0720 05/04/24 1509  BP: 135/85 125/82 111/88 113/77  Pulse: (!) 109 97 (!) 101 95  Resp: 18 18 17 17   Temp: 97.8 F (36.6 C) 98.1 F (36.7 C) 98 F (36.7 C) 98.3 F (36.8 C)  TempSrc:    Oral  SpO2: 100% 98% 99% 97%  Weight:      Height:        Intake/Output Summary (Last 24 hours) at 05/04/2024 1602 Last data filed at 05/04/2024 1120 Gross per 24 hour  Intake 720 ml  Output --  Net 720 ml   Filed Weights   04/28/24 1655  Weight: 57.6 kg   Examination: Physical Exam:  Constitutional: WN/WD Caucasian female in no acute distress Respiratory: Slightly diminished to auscultation bilaterally with some coarse breath sounds, no wheezing, rales, rhonchi or crackles. Normal respiratory effort and patient is not tachypenic. No accessory muscle use.  Unlabored breathing and not wearing supplemental oxygen nasal cannula Cardiovascular: RRR, no murmurs / rubs / gallops. S1 and S2 auscultated.  No appreciable  pulmonary edema Abdomen: Soft, non-tender, non-distended. Bowel sounds positive.  GU: Deferred. Musculoskeletal: No clubbing / cyanosis of digits/nails. No joint deformity upper and lower extremities.  Skin: Left leg is wrapped and there is no other appreciable rashes or lesions on the limited skin evaluation. No induration; Warm and dry.  Neurologic: CN 2-12 grossly intact with no focal deficits. Romberg sign and cerebellar reflexes not assessed.  Psychiatric: Normal judgment and insight. Alert and oriented x 3. Normal mood and appropriate affect.   Data Reviewed: I have personally reviewed following labs and imaging studies  CBC: Recent Labs  Lab 05/01/24 0448 05/01/24 1240 05/02/24 0622 05/03/24 0418 05/03/24 1207 05/04/24 0923  WBC 14.2* 14.4* 15.2* 15.7*  --  14.3*  NEUTROABS 8.6* 8.9* 10.2* 11.9*  --  8.0*  HGB 7.2* 7.4* 7.4* 6.6* 8.6* 9.0*  HCT 21.7* 22.4* 22.4* 19.8* 25.5* 26.9*  MCV 96.9 97.0 96.6 96.1  --  97.1  PLT 324 329 353 344  --  442*   Basic Metabolic Panel: Recent Labs  Lab 04/28/24 0438 04/29/24 0334 04/30/24 0601 05/01/24 0448 05/02/24 0622 05/03/24 0418 05/04/24 0923  NA 135   < > 135 133* 134* 136 138  K 4.5   < > 4.0 3.8 4.1 4.6 4.0  CL 99   < > 98 96* 100 100 101  CO2 27   < > 25 26 26 26 26   GLUCOSE 127*   < > 150* 97 90 126* 93  BUN 14   < > 7 11 12 6 7   CREATININE 0.67   < > 0.66 0.76 0.70 0.64 0.68  CALCIUM  8.6*   < > 8.1* 8.7* 8.1* 8.3* 8.2*  MG 1.8  --   --   --  1.7 2.1 1.8  PHOS 3.2  --   --   --  2.7 3.4 3.0   < > = values in this interval not displayed.   GFR: Estimated Creatinine Clearance: 69 mL/min (by C-G formula based on SCr of 0.68 mg/dL). Liver Function Tests: Recent Labs  Lab 05/02/24 0622 05/03/24 0418 05/04/24 0923  AST 21 23 27   ALT 12 12 14   ALKPHOS 42 41 49  BILITOT 0.6 0.5 0.9  PROT 5.2* 5.5* 5.9*  ALBUMIN 2.4* 2.4* 2.6*   No results for input(s): LIPASE, AMYLASE in the last 168 hours. No results for  input(s): AMMONIA in the last 168 hours. Coagulation Profile: No results for input(s): INR, PROTIME in the last  168 hours. Cardiac Enzymes: No results for input(s): CKTOTAL, CKMB, CKMBINDEX, TROPONINI in the last 168 hours. BNP (last 3 results) No results for input(s): PROBNP in the last 8760 hours. HbA1C: Recent Labs    05/02/24 0622  HGBA1C 5.1   CBG: No results for input(s): GLUCAP in the last 168 hours. Lipid Profile: No results for input(s): CHOL, HDL, LDLCALC, TRIG, CHOLHDL, LDLDIRECT in the last 72 hours. Thyroid  Function Tests: No results for input(s): TSH, T4TOTAL, FREET4, T3FREE, THYROIDAB in the last 72 hours. Anemia Panel: No results for input(s): VITAMINB12, FOLATE, FERRITIN, TIBC, IRON, RETICCTPCT in the last 72 hours. Sepsis Labs: No results for input(s): PROCALCITON, LATICACIDVEN in the last 168 hours.  Recent Results (from the past 240 hours)  Culture, blood (Routine X 2) w Reflex to ID Panel     Status: None   Collection Time: 04/27/24 10:17 PM   Specimen: BLOOD RIGHT FOREARM  Result Value Ref Range Status   Specimen Description BLOOD RIGHT FOREARM  Final   Special Requests   Final    BOTTLES DRAWN AEROBIC AND ANAEROBIC Blood Culture adequate volume   Culture   Final    NO GROWTH 5 DAYS Performed at [facility], [address] [place], [place] [address]    Report Status 05/02/2024 FINAL  Final  Culture, blood (Routine X 2) w Reflex to ID Panel     Status: None   Collection Time: 04/27/24 10:17 PM   Specimen: BLOOD  Result Value Ref Range Status   Specimen Description BLOOD BLOOD RIGHT HAND  Final   Special Requests   Final    BOTTLES DRAWN AEROBIC AND ANAEROBIC Blood Culture adequate volume   Culture   Final    NO GROWTH 5 DAYS Performed at [facility], [address], [place], [place] [address]    Report Status 05/02/2024 FINAL  Final  MRSA Next Gen by PCR, Nasal     Status: Abnormal    Collection Time: 04/28/24  5:52 PM   Specimen: Nasal Mucosa; Nasal Swab  Result Value Ref Range Status   MRSA by PCR Next Gen DETECTED (A) NOT DETECTED Final    Comment: CRITICAL RESULTS CALLED TO, READ BACK BY AND VERIFIED WITH : RN M.SHIESTHA ON 04/29/24 AT 0042 BY NM (NOTE) The GeneXpert MRSA Assay (FDA approved for NASAL specimens only), is one component of a comprehensive MRSA colonization surveillance program. It is not intended to diagnose MRSA infection nor to guide or monitor treatment for MRSA infections. Test performance is not FDA approved in patients less than 63 years old. Performed at [facility], [address], [place], [place] [address]     Radiology Studies: No results found.  Scheduled Meds:  sodium chloride    Intravenous Once   acetaminophen   650 mg Oral Q6H   calcium -vitamin D   1 tablet Oral Q breakfast   DULoxetine   60 mg Oral Daily   enoxaparin  (LOVENOX ) injection  40 mg Subcutaneous Q24H   loratadine   10 mg Oral Daily   mupirocin  ointment   Nasal BID   nicotine   21 mg Transdermal Daily   pneumococcal 20-valent conjugate vaccine  0.5 mL Intramuscular Tomorrow-1000   polyethylene glycol  17 g Oral BID   senna-docusate  1 tablet Oral BID   simvastatin   20 mg Oral q1800   Continuous Infusions:   LOS: 7 days   Alejandro Marker, DO [facility] Available via Epic secure chat 7am-7pm After these hours, please refer to coverage provider listed  on [email or web address] 05/04/2024, 4:02 PM

## 2024-05-04 NOTE — Progress Notes (Signed)
 RE:  [name]       Date of Birth:  09/25/1965     Date:  05/04/24        To Whom It May Concern:  Please be advised that the above-named patient will require a short-term nursing home stay - anticipated 30 days or less for rehabilitation and strengthening.  The plan is for return home.                 MD signature                Date

## 2024-05-05 DIAGNOSIS — S82142A Displaced bicondylar fracture of left tibia, initial encounter for closed fracture: Secondary | ICD-10-CM | POA: Diagnosis not present

## 2024-05-05 LAB — GLUCOSE, CAPILLARY: Glucose-Capillary: 92 mg/dL (ref 70–99)

## 2024-05-05 NOTE — Discharge Instructions (Signed)
 [facility] Discharge Instructions   Discharge Wound Care Instructions  Wound Care: You may remove your surgical dressing on Friday, 05/06/2024. Incisions can be left open to air if there is no drainage. Once the incision is completely dry and without drainage, it may be left open to air out.  Showering may begin Saturday, 05/07/2024.  Clean incision gently with soap and water .  Do NOT apply any ointments, solutions or lotions to pin sites or surgical wounds.  These prevent needed drainage and even though solutions like hydrogen peroxide kill bacteria, they also damage cells lining the pin sites that help fight infection.  Applying lotions or ointments can keep the wounds moist and can cause them to breakdown and open up as well. This can increase the risk for infection. When in doubt call the office.  Surgical incisions should be dressed daily.  If any drainage is noted, use one layer of adaptic or Mepitel, then gauze, Kerlix, and an ace wrap. - These dressing supplies should be available at local medical supply stores ([facility], [facility], etc) as well as insurance claims handler ([facility], [facility], [employer], etc)  Traumatic wounds should be dressed daily as well.    One layer of adaptic, gauze, Kerlix, then ace wrap.  The adaptic can be discontinued once the draining has ceased    If you have a wet to dry dressing: wet the gauze with saline the squeeze as much saline out so the gauze is moist (not soaking wet), place moistened gauze over wound, then place a dry gauze over the moist one, followed by Kerlix wrap, then ace wrap.  General Discharge Instructions  WEIGHT BEARING STATUS: Nonweightbearing left lower extremity  RANGE OF MOTION/ACTIVITY: Okay for knee range of motion as tolerated  DVT/PE prophylaxis: Eliquis 2.5 mg twice daily x 30 days  Diet: as you were eating previously.  Can use over the counter stool softeners and bowel preparations, such as Miralax , to  help with bowel movements.  Narcotics can be constipating.  Be sure to drink plenty of fluids  PAIN MEDICATION USE AND EXPECTATIONS  You have likely been given narcotic medications to help control your pain.  After a traumatic event that results in an fracture (broken bone) with or without surgery, it is ok to use narcotic pain medications to help control one's pain.  We understand that everyone responds to pain differently and each individual patient will be evaluated on a regular basis for the continued need for narcotic medications. Ideally, narcotic medication use should last no more than 6-8 weeks (coinciding with fracture healing).   As a patient it is your responsibility as well to monitor narcotic medication use and report the amount and frequency you use these medications when you come to your office visit.   We would also advise that if you are using narcotic medications, you should take a dose prior to therapy to maximize you participation.  IF YOU ARE ON NARCOTIC MEDICATIONS IT IS NOT PERMISSIBLE TO OPERATE A MOTOR VEHICLE (MOTORCYCLE/CAR/TRUCK/MOPED) OR HEAVY MACHINERY DO NOT MIX NARCOTICS WITH OTHER CNS (CENTRAL NERVOUS SYSTEM) DEPRESSANTS SUCH AS ALCOHOL  POST-OPERATIVE OPIOID TAPER INSTRUCTIONS: It is important to wean off of your opioid medication as soon as possible. If you do not need pain medication after your surgery it is ok to stop day one. Opioids include: Codeine, Hydrocodone(Norco, Vicodin), Oxycodone (Percocet, oxycontin ) and hydromorphone  amongst others.  Long term and even short term use of opiods can cause: Increased pain response Dependence Constipation Depression  Respiratory depression And more.  Withdrawal symptoms can include Flu like symptoms Nausea, vomiting And more Techniques to manage these symptoms Hydrate well Eat regular healthy meals Stay active Use relaxation techniques(deep breathing, meditating, yoga) Do Not substitute Alcohol to help with  tapering If you have been on opioids for less than two weeks and do not have pain than it is ok to stop all together.  Plan to wean off of opioids This plan should start within one week post op of your fracture surgery  Maintain the same interval or time between taking each dose and first decrease the dose.  Cut the total daily intake of opioids by one tablet each day Next start to increase the time between doses. The last dose that should be eliminated is the evening dose.    STOP SMOKING OR USING NICOTINE  PRODUCTS!!!!  As discussed nicotine  severely impairs your body's ability to heal surgical and traumatic wounds but also impairs bone healing.  Wounds and bone heal by forming microscopic blood vessels (angiogenesis) and nicotine  is a vasoconstrictor (essentially, shrinks blood vessels).  Therefore, if vasoconstriction occurs to these microscopic blood vessels they essentially disappear and are unable to deliver necessary nutrients to the healing tissue.  This is one modifiable factor that you can do to dramatically increase your chances of healing your injury.  (This means no smoking, no nicotine  gum, patches, etc)  DO NOT USE NONSTEROIDAL ANTI-INFLAMMATORY DRUGS (NSAID'S)  Using products such as Advil  (ibuprofen ), Aleve  (naproxen ), Motrin  (ibuprofen ) for additional pain control during fracture healing can delay and/or prevent the healing response.  If you would like to take over the counter (OTC) medication, Tylenol  (acetaminophen ) is ok.  However, some narcotic medications that are given for pain control contain acetaminophen  as well. Therefore, you should not exceed more than 4000 mg of tylenol  in a day if you do not have liver disease.  Also note that there are may OTC medicines, such as cold medicines and allergy medicines that my contain tylenol  as well.  If you have any questions about medications and/or interactions please ask your doctor/PA or your pharmacist.      ICE AND ELEVATE  INJURED/OPERATIVE EXTREMITY  Using ice and elevating the injured extremity above your heart can help with swelling and pain control.  Icing in a pulsatile fashion, such as 20 minutes on and 20 minutes off, can be followed.    Do not place ice directly on skin. Make sure there is a barrier between to skin and the ice pack.    Using frozen items such as frozen peas works well as the conform nicely to the are that needs to be iced.  USE AN ACE WRAP OR TED HOSE FOR SWELLING CONTROL  In addition to icing and elevation, Ace wraps or TED hose are used to help limit and resolve swelling.  It is recommended to use Ace wraps or TED hose until you are informed to stop.    When using Ace Wraps start the wrapping distally (farthest away from the body) and wrap proximally (closer to the body)   Example: If you had surgery on your leg or thing and you do not have a splint on, start the ace wrap at the toes and work your way up to the thigh        If you had surgery on your upper extremity and do not have a splint on, start the ace wrap at your fingers and work your way up to the upper arm  CALL THE OFFICE FOR MEDICATION REFILLS OR WITH ANY QUESTIONS/CONCERNS: [phone]   VISIT OUR WEBSITE FOR ADDITIONAL INFORMATION: [email or web address]    Call office for the following: Temperature greater than 101F Persistent nausea and vomiting Severe uncontrolled pain Redness, tenderness, or signs of infection (pain, swelling, redness, odor or green/yellow discharge around the site) Difficulty breathing, headache or visual disturbances Hives Persistent dizziness or light-headedness Extreme fatigue Any other questions or concerns you may have after discharge  In an emergency, call 911 or go to an Emergency Department at a nearby hospital  OTHER HELPFUL INFORMATION  If you had a block, it will wear off between 8-24 hrs postop typically.  This is period when your pain may go from nearly zero to the pain you would  have had postop without the block.  This is an abrupt transition but nothing dangerous is happening.  You may take an extra dose of narcotic when this happens.  You should wean off your narcotic medicines as soon as you are able.  Most patients will be off or using minimal narcotics before their first postop appointment.   We suggest you use the pain medication the first night prior to going to bed, in order to ease any pain when the anesthesia wears off. You should avoid taking pain medications on an empty stomach as it will make you nauseous.  Do not drink alcoholic beverages or take illicit drugs when taking pain medications.  In most states it is against the law to drive while you are in a splint or sling.  And certainly against the law to drive while taking narcotics.  You may return to work/school in the next couple of days when you feel up to it.   Pain medication may make you constipated.  Below are a few solutions to try in this order: Decrease the amount of pain medication if you aren't having pain. Drink lots of decaffeinated fluids. Drink prune juice and/or each dried prunes  If the first 3 don't work start with additional solutions Take Colace - an over-the-counter stool softener Take Senokot - an over-the-counter laxative Take Miralax  - a stronger over-the-counter laxative

## 2024-05-05 NOTE — TOC Progression Note (Addendum)
 Transition of Care [facility]) - Progression Note    Patient Details  Name: [name] MRN: [id number] Date of Birth: 01-28-1966  [facility] CM/SW Contact  Bridget Cordella Simmonds, LCSW Phone Number: 05/05/2024, 10:27 AM  Clinical Narrative:   Bed offers provided to pt, who requested CSW speak with her sister Ginger.  Bed offers provided to Tabitha by phone, she wants to accept offer at [facility] rehab, pt in agreement.  CSW confirmed with Shanna/[facility].  Damian will start medicaid auth.  1200: Message from Shanna: auth approved. MD informed.  1515: PASSR received: [id number] E.   Expected Discharge Plan: Skilled Nursing Facility Barriers to Discharge: Continued Medical Work up               Expected Discharge Plan and Services                                               Social Drivers of Health (SDOH) Interventions SDOH Screenings   Food Insecurity: No Food Insecurity (04/28/2024)  Housing: Low Risk  (04/28/2024)  Transportation Needs: No Transportation Needs (04/28/2024)  Utilities: Not At Risk (04/28/2024)  Depression (PHQ2-9): Medium Risk (04/03/2023)  Social Connections: Unknown (11/04/2021)   Received from [facility]  Tobacco Use: Medium Risk (04/29/2024)    Readmission Risk Interventions     No data to display

## 2024-05-05 NOTE — Discharge Summary (Signed)
 Physician Discharge Summary   Patient: [name] MRN: [id number] DOB: [birth year]  Admit date:     04/27/2024  Discharge date: 05/05/24  Discharge Physician: Elgin Lam, MD Thank you appreciation genetic counselor   PCP: Lavell Bari [name], FNP   Recommendations at discharge:  PCP visit for hospital follow-up Orthopedic surgery visit for hospital follow-up in 2 weeks with plan for repeat x-ray and wound check  Discharge Diagnoses: Principal Problem:   Closed fracture of left tibial plateau, initial encounter Active Problems:   GAD (generalized anxiety disorder)   Patella fracture   Anemia, iron deficiency   Constipation   Osteoporosis   Tobacco abuse   Fall at home, initial encounter  Resolved Problems:   * No resolved hospital problems. *  Hospital Course:  [name] is a 58 y.o. female with a history of hyperlipidemia, anxiety, osteoporosis, tobacco use.  Patient presented secondary to a fall after losing her balance on a stool, suffering left tibia plateau and fibular head/neck fractures, in addition to an open left third digit finger. Empiric antibiotics started and orthopedic surgery consulted for management, performing external fixation of left leg/tibia and closed reduction of left tibial plateau fracture.   Assessment and Plan:  Closed comminuted fracture of left tibia plateau Closed comminuted fracture of the left fibular head/neck Secondary to trauma from fall and noted on x-ray/CT imaging. Complicated by underlying osteoporosis. Orthopedic surgery consulted and performed external fixation of the left leg/tibia and closed reduction of the left tibial plateau fracture. ORIF of the left tibial plateau fracture performed on 11/10, with removal of external fixation. Recommendation for nonweightbearing and Lovenox  per orthopedic surgery. Recommendation for wound check and repeat x-rays 2 weeks after discharge.   Fall Secondary to loss of balance and fall off a  stool. Patient evaluated by PT/OT post-operative with recommendation for SNF.   Open left third digit fracture Secondary to fall. Cefazolin  started for antibiotic coverage. Orthopedic surgery consulted. Fingers redressed.   Leukocytosis Likely reactive and secondary to trauma and fracture. No fevers or symptoms for infection. Patient does have an open fracture, however. Antibiotics started and completed.   Generalized anxiety disorder Continue Cymbalta .   Acute blood loss anemia Baseline hemoglobin appears to be around 12-13. Hemoglobin of 11.8 on admission with drift downward. Postoperatively, hemoglobin is down to a low of 6.6 requiring 1 unit of PRBC via transfusion on 11/11. Post-transfusion hemoglobin of 8.6 and up to 9.0.   Hyperlipidemia Continue simvastatin .   Osteoporosis Continue alendronate , calcium  and vitamin D    Tobacco use Cessation recommended. Nicotine  patch ordered. Continue nicotine  patch on discharge.   Consultants:  Orthopedic surgery   Procedures:  Orthopedic surgery (11/7) External fixation of left leg/tibia Closed reduction of left tibial plateau fracture Dressing change to left hand Orthopedic surgery (11/10) Open reduction internal fixation of left bicondylar tibial plateau fracture Removal of left knee external fixation  Disposition: Skilled nursing facility Diet recommendation: Regular diet   DISCHARGE MEDICATION: Allergies as of 05/05/2024       Reactions   Codeine Other (See Comments)   Makes pt.have seizure   Hydrocodone Other (See Comments)   May have caused a seizure per patient reported.        Medication List     STOP taking these medications    nicotine  polacrilex 4 MG gum Commonly known as: NICORETTE        TAKE these medications    acetaminophen  650 MG CR tablet Commonly known as: TYLENOL  Take 1,300  mg by mouth every 8 (eight) hours as needed for pain.   alendronate  70 MG tablet Commonly known as: FOSAMAX  TAKE  1 TABLET BY MOUTH ONCE A WEEK. TAKE WITH A FULL GLASS OF WATER  ON AN EMPTY STOMACH. What changed: See the new instructions.   aspirin  EC 325 MG tablet Take 1 tablet (325 mg total) by mouth daily.   calcium -vitamin D  500-200 MG-UNIT tablet Commonly known as: OSCAL WITH D Take 1 tablet by mouth daily.   celecoxib  200 MG capsule Commonly known as: CeleBREX  Take 1 capsule (200 mg total) by mouth 2 (two) times daily.   chlorhexidine  4 % external liquid Commonly known as: HIBICLENS  Apply 15 mLs (1 Application total) topically as directed for 30 doses. Use as directed daily for 5 days every other week for 6 weeks.   cyclobenzaprine  10 MG tablet Commonly known as: FLEXERIL  Take 1 tablet (10 mg total) by mouth 3 (three) times daily as needed for muscle spasms.   diclofenac  Sodium 1 % Gel Commonly known as: VOLTAREN  Apply 2 g topically 4 (four) times daily. What changed:  when to take this reasons to take this   docusate sodium  100 MG capsule Commonly known as: COLACE Take 100 mg by mouth daily.   DULoxetine  60 MG capsule Commonly known as: CYMBALTA  Take 1 capsule (60 mg total) by mouth daily.   fexofenadine  180 MG tablet Commonly known as: ALLEGRA  TAKE 1 TABLET (180 MG TOTAL) BY MOUTH DAILY. OTC   fluticasone  50 MCG/ACT nasal spray Commonly known as: FLONASE  SPRAY 2 SPRAYS INTO EACH NOSTRIL EVERY DAY   hydrOXYzine  25 MG capsule Commonly known as: VISTARIL  Take 1 capsule (25 mg total) by mouth every 6 (six) hours as needed. What changed: reasons to take this   Magnesium  Glycinate 120 MG Caps Take 1 capsule by mouth daily.   mupirocin  ointment 2 % Commonly known as: BACTROBAN  Place 1 Application into the nose 2 (two) times daily for 60 doses. Use as directed 2 times daily for 5 days every other week for 6 weeks.   nicotine  14 mg/24hr patch Commonly known as: NICODERM CQ  - dosed in mg/24 hours Place 1 patch (14 mg total) onto the skin daily. What changed:  when to  take this reasons to take this   oxyCODONE  5 MG immediate release tablet Commonly known as: Oxy IR/ROXICODONE  Take 1-2 tablets (5-10 mg total) by mouth every 4 (four) hours as needed (5 mg pain score 4-6, 10 mg pain score 7-10).   simvastatin  20 MG tablet Commonly known as: ZOCOR  Take 1 tablet (20 mg total) by mouth daily at 6 PM.        Follow-up Information     Haddix, Franky SQUIBB, MD. Schedule an appointment as soon as possible for a visit in 2 week(s).   Specialty: Orthopedic Surgery Why: for wound check and repeat x-rays Contact information: [address] [place] [address] [phone]                Discharge Exam: BP 98/81 (BP Location: Left Arm)   Pulse 93   Temp 97.8 F (36.6 C)   Resp 17   Ht 5' 5 (1.651 m)   Wt 57.6 kg   SpO2 99%   BMI 21.13 kg/m   General exam: Appears calm and comfortable. Respiratory system: Clear to auscultation. Respiratory effort normal. Cardiovascular system: S1 & S2 heard, fast rate with normal rhythm. No murmur. Gastrointestinal system: Abdomen is nondistended, soft and nontender. Normal bowel  sounds heard. Central nervous system: Alert and oriented. No focal neurological deficits. Musculoskeletal: Left leg in post-operative dressing.  Psychiatry: Judgement and insight appear normal. Mood & affect appropriate  Condition at discharge: stable  The results of significant diagnostics from this hospitalization (including imaging, microbiology, ancillary and laboratory) are listed below for reference.   Imaging Studies: DG Knee Left Port Result Date: 05/02/2024 CLINICAL DATA:  03948 Fracture 03948 EXAM: PORTABLE LEFT KNEE - 1-2 VIEW COMPARISON:  April 29, 2024 FINDINGS: Osteopenia. Status post ORIF of the proximal tibia as well as wire cerclage of the patella. Orthopedic hardware is intact and without periprosthetic fracture or lucency. Ghost screw tracks of the distal femur and tibia. Revisualization of comminuted  fractures of the proximal tibia and fibula. Tibia fracture fragments are in improved alignment. Intra-articular air. IMPRESSION: Expected postsurgical appearance status post ORIF of the proximal tibia Electronically Signed   By: Corean Salter M.D.   On: 05/02/2024 12:48   DG Knee Complete 4 Views Left Result Date: 05/02/2024 CLINICAL DATA:  Elective surgery. EXAM: LEFT KNEE - COMPLETE 4+ VIEW COMPARISON:  Radiograph 04/29/2024 FINDINGS: 5 fluoroscopic spot views of the left knee submitted from the operating room. Removal of external fixator. Interval lateral plate and screw fixation of proximal tibial fracture. Grossly unchanged alignment of proximal fibular fracture. Previous ORIF of patella. Fluoroscopy time 1 minutes 28 seconds. Dose 2.96 mGy. IMPRESSION: Intraoperative fluoroscopy during ORIF of proximal tibial fracture. Electronically Signed   By: Andrea Gasman M.D.   On: 05/02/2024 11:00   DG C-Arm 1-60 Min-No Report Result Date: 05/02/2024 Fluoroscopy was utilized by the requesting physician.  No radiographic interpretation.   DG C-Arm 1-60 Min-No Report Result Date: 05/02/2024 Fluoroscopy was utilized by the requesting physician.  No radiographic interpretation.   DG Knee Left Port Result Date: 04/29/2024 EXAM: 1 OR 2 VIEW(S) XRAY OF THE KNEE 04/29/2024 04:41:00 PM COMPARISON: Intraoperative films from earlier in the same day. CLINICAL HISTORY: Fracture [id number] FINDINGS: BONES AND JOINTS: External fixation device noted. Internal fixation of patella with pins. Comminuted tibial plateau fracture. Comminuted fracture of proximal fibula. Joint effusion with fat fluid level. SOFT TISSUES: The soft tissues are unremarkable. IMPRESSION: 1. Comminuted tibial plateau fracture and comminuted proximal fibular fracture. 2. Lipohemarthrosis. 3. External fixation device and patellar internal fixation hardware present. Electronically signed by: Oneil Devonshire MD 04/29/2024 11:07 PM EST RP Workstation:  [id number]   DG Knee 1-2 Views Left Result Date: 04/29/2024 EXAM: FLUOROSCOPIC IMAGES, 8 VIEWS TECHNIQUE: Fluoroscopy was provided by the radiology department for procedure. Radiologist was not present during examination. FLUOROSCOPY DOSE AND TYPE: Reference Air Kerma (in mGy) = 0.4 Fluoro time: 12 secs COMPARISON: Comparison study 04/27/2024. CLINICAL HISTORY: 461500 Elective surgery 461500 Elective surgery. FINDINGS: Intraoperative fluoroscopic imaging was performed. Prior patellar fixation is noted. Screws were extended into the midshaft of the tibia and mid to distal femur for placement of external fixator. The fracture fragments were reduced following placement of the external fixator. IMPRESSION: 1. Intraoperative fluoroscopic spot images documenting external fixator screw placement across the distal femur and proximal tibia with interval fracture fragment reduction. NOTE: Intraoperative fluoroscopic spot images as above. Please refer to the intraoperative report for full details. Electronically signed by: Oneil Devonshire MD 04/29/2024 11:06 PM EST RP Workstation: [id number] C-Arm 1-60 Min-No Report Result Date: 04/29/2024 Fluoroscopy was utilized by the requesting physician.  No radiographic interpretation.   CT Knee Left Wo Contrast Result Date: 04/27/2024 CLINICAL DATA:  Trauma to the left  knee. EXAM: CT OF THE LEFT KNEE WITHOUT CONTRAST TECHNIQUE: Multidetector CT imaging of the left knee was performed according to the standard protocol. Multiplanar CT image reconstructions were also generated. RADIATION DOSE REDUCTION: This exam was performed according to the departmental dose-optimization program which includes automated exposure control, adjustment of the mA and/or kV according to patient size and/or use of iterative reconstruction technique. COMPARISON:  Radiograph dated 04/27/2024. FINDINGS: Bones/Joint/Cartilage Comminuted and intra-articular fracture of the proximal tibia involving the  tibial spine, medial and lateral tibial plateau and extending into the proximal tibial diaphysis. There is depression of the lateral tibial plateau. There is comminuted and displaced fracture of the fibular head and neck. Prior fixation of the patella. There is severe osteopenia. No dislocation. Large suprapatellar lipohemarthrosis. Ligaments Suboptimally assessed by CT. Muscles and Tendons There is edema of the musculature of the calf.  No fluid collection. Soft tissues Subcutaneous stranding of the anterior and medial calf. IMPRESSION: 1. Comminuted intra-articular fracture of the proximal tibia as well as displaced comminuted fracture of the fibular head and neck. No dislocation. 2. Large suprapatellar lipohemarthrosis. Electronically Signed   By: Vanetta Chou M.D.   On: 04/27/2024 19:49   DG Hand Complete Left Result Date: 04/27/2024 EXAM: 3 OR MORE VIEW(S) XRAY OF THE LEFT HAND 04/27/2024 07:20:30 PM COMPARISON: 04/27/2024 CLINICAL HISTORY: post reduction FINDINGS: BONES AND JOINTS: Acute intra-articular fracture of the distal aspect of the third digit middle phalanx. Improved alignment of the third DIP joint status post reduction. Suspicion of small intraarticular fracture at the base of the third distal phalanx. Chondrocalcinosis of the TFCC. SOFT TISSUES: Soft tissue edema. IMPRESSION: 1. Acute intra-articular fracture of the distal aspect of the third digit middle phalanx and suspected small intraarticular fracture at the base of the third distal phalanx. Reduction of third digit dip dislocation now with normal alignment . 2. Normal alignment fifth digit. Suspected fracture of the distal aspect of the fifth proximal phalanx is not well seen post splinting. Electronically signed by: Luke Bun MD 04/27/2024 07:28 PM EST RP Workstation: [id number]   DG Chest Portable 1 View Result Date: 04/27/2024 EXAM: 1 VIEW(S) XRAY OF THE CHEST 04/27/2024 06:29:00 PM COMPARISON: 04/06/2023 CLINICAL HISTORY: pre  op FINDINGS: LUNGS AND PLEURA: No focal pulmonary opacity. No pulmonary edema. No pleural effusion. No pneumothorax. HEART AND MEDIASTINUM: Aortic arch calcifications. BONES AND SOFT TISSUES: Chronic midthoracic vertebral body compression fracture. IMPRESSION: 1. No acute cardiopulmonary abnormality. Electronically signed by: Luke Bun MD 04/27/2024 06:49 PM EST RP Workstation: [id number]   DG Hand Complete Left Result Date: 04/27/2024 EXAM: 3 OR MORE VIEW(S) XRAY OF THE LEFT HAND 04/27/2024 05:35:00 PM COMPARISON: None available. CLINICAL HISTORY: fall, deformity FINDINGS: BONES AND JOINTS: Acute displaced intra-articular fracture of the distal aspect of the third digit middle phalanx. Acute displaced fracture of the head of the fifth digit proximal phalanx. Dislocation at the third DIP joint with the distal phalanx displaced to the radial side of the middle phalanx and directed laterally. Cardiogenic calcification at the wrist. Flexion deformity of the fifth digit proximal interphalangeal joint. Scattered degenerative changes. SOFT TISSUES: Soft tissue swelling and laceration. IMPRESSION: 1. Acute displaced intra-articular fracture of the distal/head of third middle phalanx with associated distal interphalangeal joint dislocation and lateral/radial displacement of the distal phalanx with respect to the middle phalanx . 2. Acute displaced fracture of the head of the fifth proximal phalanx. 3. Soft tissue swelling and laceration. Electronically signed by: Luke Bun MD 04/27/2024 06:20 PM EST RP Workstation:  [id number]   DG Knee Complete 4 Views Left Result Date: 04/27/2024 EXAM: 4 VIEW(S) XRAY OF THE LEFT KNEE 04/27/2024 05:35:00 PM COMPARISON: 07/16/2022 CLINICAL HISTORY: fall, deformity FINDINGS: BONES AND JOINTS: Patellar cerclage wire in place. Comminuted proximal tibial fracture with intra-articular extension. Displacement and slight depression of the posterior lateral tibial plateau. Comminuted  fibular head and neck fracture with displacement and mild angulation. Lipohemarthrosis. Linear artifact projects over the distal shaft of the femur and the proximal lower leg. SOFT TISSUES: Surrounding soft tissue swelling. IMPRESSION: 1. Comminuted proximal tibial fracture with intra-articular extension, with displacement and slight depression of the posterior, lateral tibial plateau. 2. Comminuted fibular head and neck fracture with displacement and mild angulation. 3. Lipohemarthrosis. Electronically signed by: Luke Bun MD 04/27/2024 06:13 PM EST RP Workstation: [id number]   DG Tibia/Fibula Left Result Date: 04/27/2024 EXAM: _VIEWS_ VIEW(S) XRAY OF THE LEFT TIBIA AND FIBULA 04/27/2024 05:35:00 PM COMPARISON: None available. CLINICAL HISTORY: fall, deformity FINDINGS: BONES AND JOINTS: Patellar cerclage wires in place. Comminuted proximal tibial fracture with possible intra-articular extension. Displaced lateral tibial plateau. Comminuted fibular head and neck fracture. Joint effusion with lipohemarthrosis. SOFT TISSUES: The soft tissues are unremarkable. IMPRESSION: 1. Highly comminuted intraarticular proximal tibial fracture with lipohemarthrosis. 2. Comminuted fibular head and neck fractures. Electronically signed by: Luke Bun MD 04/27/2024 06:10 PM EST RP Workstation: [id number]   DG [facility] DEXA Result Date: 04/12/2024 EXAM: DUAL X-RAY ABSORPTIOMETRY (DXA) FOR BONE MINERAL DENSITY 04/12/2024 5:08 pm CLINICAL DATA:  58 year old Female Postmenopausal. Postmenopause History of fragility fracture. History of hip and vertebral fracture. Patient is or has been on bone building therapies. TECHNIQUE: An axial (e.g., hips, spine) and/or appendicular (e.g., radius) exam was performed, as appropriate, using [employer] [facility]. Images are obtained for bone mineral density measurement and are not obtained for diagnostic purposes. [id number] Exclusions:  L4; left hip due to replacement. COMPARISON:  10/01/2021. FINDINGS: Scan quality: Good. LUMBAR SPINE (L1-L3): BMD (in g/cm2): 0.942 T-score: -2.0 Z-score: -0.7 Rate of change from previous exam: 7.7 % RIGHT FEMORAL NECK: BMD (in g/cm2): 0.760 T-score: -2.0 Z-score: -0.7 RIGHT TOTAL HIP: BMD (in g/cm2): 0.694 T-score: -2.5 Z-score: -1.5 Rate of change from previous exam: No significant rate of change from previous exam. LEFT FOREARM (RADIUS 33%): BMD (in g/cm2): 0.831 T-score: -0.6 Z-score: 0.2 FRAX 10-YEAR PROBABILITY OF FRACTURE: FRAX not reported as the lowest BMD is not in the osteopenia range. IMPRESSION: Osteoporosis based on BMD. Fracture risk is unknown due to history of bone building therapy. RECOMMENDATIONS: 1. All patients should optimize calcium  and vitamin D  intake. 2. Consider FDA-approved medical therapies in postmenopausal women and men aged 50 years and older, based on the following: - A hip or vertebral (clinical or morphometric) fracture - T-score less than or equal to -2.5 and secondary causes have been excluded. - Low bone mass (T-score between -1.0 and -2.5) and a 10-year probability of a hip fracture greater than or equal to 3% or a 10-year probability of a major osteoporosis-related fracture greater than or equal to 20% based on the [place] -adapted WHO algorithm. - Clinician judgment and/or patient preferences may indicate treatment for people with 10-year fracture probabilities above or below these levels 3. Patients with diagnosis of osteoporosis or at high risk for fracture should have regular bone mineral density tests. For patients eligible for Medicare, routine testing is allowed once every 2 years. The testing frequency can be increased to one year for patients who have  rapidly progressing disease, those who are receiving or discontinuing medical therapy to restore bone mass, or have additional risk factors. Electronically Signed   By: Dina  Arceo M.D.   On: 04/12/2024 18:51     Microbiology: Results for orders placed or performed during the hospital encounter of 04/27/24  Culture, blood (Routine X 2) w Reflex to ID Panel     Status: None   Collection Time: 04/27/24 10:17 PM   Specimen: BLOOD RIGHT FOREARM  Result Value Ref Range Status   Specimen Description BLOOD RIGHT FOREARM  Final   Special Requests   Final    BOTTLES DRAWN AEROBIC AND ANAEROBIC Blood Culture adequate volume   Culture   Final    NO GROWTH 5 DAYS Performed at [facility], [address], [place], [place] [address]    Report Status 05/02/2024 FINAL  Final  Culture, blood (Routine X 2) w Reflex to ID Panel     Status: None   Collection Time: 04/27/24 10:17 PM   Specimen: BLOOD  Result Value Ref Range Status   Specimen Description BLOOD BLOOD RIGHT HAND  Final   Special Requests   Final    BOTTLES DRAWN AEROBIC AND ANAEROBIC Blood Culture adequate volume   Culture   Final    NO GROWTH 5 DAYS Performed at [facility], [address], [place], [place] [address]    Report Status 05/02/2024 FINAL  Final  MRSA Next Gen by PCR, Nasal     Status: Abnormal   Collection Time: 04/28/24  5:52 PM   Specimen: Nasal Mucosa; Nasal Swab  Result Value Ref Range Status   MRSA by PCR Next Gen DETECTED (A) NOT DETECTED Final    Comment: CRITICAL RESULTS CALLED TO, READ BACK BY AND VERIFIED WITH : RN M.SHIESTHA ON 04/29/24 AT 0042 BY NM (NOTE) The GeneXpert MRSA Assay (FDA approved for NASAL specimens only), is one component of a comprehensive MRSA colonization surveillance program. It is not intended to diagnose MRSA infection nor to guide or monitor treatment for MRSA infections. Test performance is not FDA approved in patients less than 78 years old. Performed at [facility], [address] [place], [place] [address]     Labs: CBC: Recent Labs  Lab 05/01/24 0448 05/01/24 1240 05/02/24 0622 05/03/24 0418 05/03/24 1207 05/04/24 0923  WBC 14.2* 14.4* 15.2* 15.7*  --  14.3*   NEUTROABS 8.6* 8.9* 10.2* 11.9*  --  8.0*  HGB 7.2* 7.4* 7.4* 6.6* 8.6* 9.0*  HCT 21.7* 22.4* 22.4* 19.8* 25.5* 26.9*  MCV 96.9 97.0 96.6 96.1  --  97.1  PLT 324 329 353 344  --  442*   Basic Metabolic Panel: Recent Labs  Lab 04/30/24 0601 05/01/24 0448 05/02/24 0622 05/03/24 0418 05/04/24 0923  NA 135 133* 134* 136 138  K 4.0 3.8 4.1 4.6 4.0  CL 98 96* 100 100 101  CO2 25 26 26 26 26   GLUCOSE 150* 97 90 126* 93  BUN 7 11 12 6 7   CREATININE 0.66 0.76 0.70 0.64 0.68  CALCIUM  8.1* 8.7* 8.1* 8.3* 8.2*  MG  --   --  1.7 2.1 1.8  PHOS  --   --  2.7 3.4 3.0   Liver Function Tests: Recent Labs  Lab 05/02/24 0622 05/03/24 0418 05/04/24 0923  AST 21 23 27   ALT 12 12 14   ALKPHOS 42 41 49  BILITOT 0.6 0.5 0.9  PROT 5.2* 5.5* 5.9*  ALBUMIN 2.4* 2.4* 2.6*  CBG: Recent Labs  Lab 05/05/24 0745  GLUCAP 92    Discharge time spent: 35 minutes.  Signed: Elgin Lam, MD [facility] 05/05/2024

## 2024-05-05 NOTE — Plan of Care (Signed)
  Problem: Education: Goal: Knowledge of General Education information will improve Description: Including pain rating scale, medication(s)/side effects and non-pharmacologic comfort measures 05/05/2024 1909 by Kimber Selinda GAILS, RN Outcome: Adequate for Discharge 05/05/2024 1749 by Kimber Selinda GAILS, RN Outcome: Adequate for Discharge   Problem: Health Behavior/Discharge Planning: Goal: Ability to manage health-related needs will improve 05/05/2024 1909 by Kimber Selinda GAILS, RN Outcome: Adequate for Discharge 05/05/2024 1749 by Kimber Selinda GAILS, RN Outcome: Adequate for Discharge   Problem: Clinical Measurements: Goal: Ability to maintain clinical measurements within normal limits will improve 05/05/2024 1909 by Kimber Selinda GAILS, RN Outcome: Adequate for Discharge 05/05/2024 1749 by Kimber Selinda GAILS, RN Outcome: Adequate for Discharge Goal: Will remain free from infection 05/05/2024 1909 by Kimber Selinda GAILS, RN Outcome: Adequate for Discharge 05/05/2024 1749 by Kimber Selinda GAILS, RN Outcome: Adequate for Discharge Goal: Diagnostic test results will improve 05/05/2024 1909 by Kimber Selinda GAILS, RN Outcome: Adequate for Discharge 05/05/2024 1749 by Kimber Selinda GAILS, RN Outcome: Adequate for Discharge Goal: Respiratory complications will improve 05/05/2024 1909 by Kimber Selinda GAILS, RN Outcome: Adequate for Discharge 05/05/2024 1749 by Kimber Selinda GAILS, RN Outcome: Adequate for Discharge Goal: Cardiovascular complication will be avoided 05/05/2024 1909 by Kimber Selinda GAILS, RN Outcome: Adequate for Discharge 05/05/2024 1749 by Kimber Selinda GAILS, RN Outcome: Adequate for Discharge   Problem: Activity: Goal: Risk for activity intolerance will decrease 05/05/2024 1909 by Kimber Selinda GAILS, RN Outcome: Adequate for Discharge 05/05/2024 1749 by Kimber Selinda GAILS, RN Outcome: Adequate for Discharge   Problem: Nutrition: Goal: Adequate nutrition will be maintained 05/05/2024 1909 by Kimber Selinda GAILS,  RN Outcome: Adequate for Discharge 05/05/2024 1749 by Kimber Selinda GAILS, RN Outcome: Adequate for Discharge   Problem: Coping: Goal: Level of anxiety will decrease 05/05/2024 1909 by Kimber Selinda GAILS, RN Outcome: Adequate for Discharge 05/05/2024 1749 by Kimber Selinda GAILS, RN Outcome: Adequate for Discharge   Problem: Elimination: Goal: Will not experience complications related to bowel motility 05/05/2024 1909 by Kimber Selinda GAILS, RN Outcome: Adequate for Discharge 05/05/2024 1749 by Kimber Selinda GAILS, RN Outcome: Adequate for Discharge Goal: Will not experience complications related to urinary retention 05/05/2024 1909 by Kimber Selinda GAILS, RN Outcome: Adequate for Discharge 05/05/2024 1749 by Kimber Selinda GAILS, RN Outcome: Adequate for Discharge   Problem: Pain Managment: Goal: General experience of comfort will improve and/or be controlled 05/05/2024 1909 by Kimber Selinda GAILS, RN Outcome: Adequate for Discharge 05/05/2024 1749 by Kimber Selinda GAILS, RN Outcome: Adequate for Discharge   Problem: Safety: Goal: Ability to remain free from injury will improve 05/05/2024 1909 by Kimber Selinda GAILS, RN Outcome: Adequate for Discharge 05/05/2024 1749 by Kimber Selinda GAILS, RN Outcome: Adequate for Discharge   Problem: Skin Integrity: Goal: Risk for impaired skin integrity will decrease 05/05/2024 1909 by Kimber Selinda GAILS, RN Outcome: Adequate for Discharge 05/05/2024 1749 by Kimber Selinda GAILS, RN Outcome: Adequate for Discharge

## 2024-05-05 NOTE — Progress Notes (Signed)
 Physical Therapy Treatment Patient Details Name: [name] MRN: [id number] DOB: [birth year] Today's Date: 05/05/2024   History of Present Illness Pt is a 58 y.o. female admitted 04/27/24 after a fall off a ~97ft step ladder hitting some concrete cinderblocks. Pt sustained a complex bicondylar left tibial plateau fracture, left third digit dip dislocation, left third digit middle phalanx and base fx, and possible left fifth proximal phalanx fx. Pt s/p closed reduction and external fixation application to LLE and I&D of left hand 11/7. LLE ORIF 11/10. PMHx: HLD, chronic anxiety, depression, HTN, osteoporosis, and current tobacco smoker.    PT Comments  Pt is progressing towards goals. Currently pt is Min A for bed mobility, CGA for sit to stand, CGA for short distance gait and Min - Mod A for one step per home set up due to weakness/balance deficits. Pt will benefit from continued skilled physical therapy services in acute care hospital setting at this time. Recommending to follow physician recommendation for physical therapy once discharged from acute care hospital setting.       If plan is discharge home, recommend the following: A little help with walking and/or transfers;A little help with bathing/dressing/bathroom;Assistance with cooking/housework;Assist for transportation;Help with stairs or ramp for entrance   Can travel by private vehicle     No  Equipment Recommendations  Wheelchair (measurements PT);Wheelchair cushion (measurements PT)       Precautions / Restrictions Precautions Precautions: Fall Recall of Precautions/Restrictions: Intact Splint/Cast - Date Prophylactic Dressing Applied (if applicable): 04/27/24 Restrictions Weight Bearing Restrictions Per Provider Order: Yes LUE Weight Bearing Per Provider Order: Weight bearing as tolerated LLE Weight Bearing Per Provider Order: Non weight bearing     Mobility  Bed Mobility Overal bed mobility: Needs Assistance Bed  Mobility: Supine to Sit, Sit to Supine     Supine to sit: Min assist Sit to supine: Min assist   General bed mobility comments: Min A with LLE    Transfers Overall transfer level: Needs assistance Equipment used: Rolling walker (2 wheels) Transfers: Sit to/from Stand Sit to Stand: Contact guard assist           General transfer comment: CGA for safety. Pt able to maintain precautions well.    Ambulation/Gait Ambulation/Gait assistance: Contact guard assist Gait Distance (Feet): 10 Feet Assistive device: Rolling walker (2 wheels) Gait Pattern/deviations: Step-to pattern Gait velocity: decreased Gait velocity interpretation: <1.31 ft/sec, indicative of household ambulator   General Gait Details: Pt ambulated using a hopping technique to maintain LLE NWB at all times. Good floor clearance   Stairs Stairs: Yes Stairs assistance: Mod assist, Min assist Stair Management: With walker, Step to pattern Number of Stairs: 1 General stair comments: per home set up pt was Min A for ascending and Mod A for descending. Some difficulty maintaining WB precautions with ascending step      Balance Overall balance assessment: Needs assistance Sitting-balance support: No upper extremity supported, Feet supported Sitting balance-Leahy Scale: Normal     Standing balance support: During functional activity, Single extremity supported Standing balance-Leahy Scale: Fair Standing balance comment: pt can stand statically with 1 leg and 1 UE supported for ADLs, CGA for safety      Communication Communication Communication: No apparent difficulties  Cognition Arousal: Alert Behavior During Therapy: WFL for tasks assessed/performed   PT - Cognitive impairments: No apparent impairments   PT - Cognition Comments: Pt A,Ox4 Following commands: Intact      Cueing Cueing Techniques: Verbal cues  General Comments General comments (skin integrity, edema, etc.): no signs/symptoms of  cardiac/respiratory distress during activity      Pertinent Vitals/Pain Pain Assessment Pain Assessment: Faces Faces Pain Scale: Hurts little more Breathing: normal Negative Vocalization: none Facial Expression: facial grimacing Body Language: relaxed Consolability: no need to console PAINAD Score: 2 Pain Location: LLE Pain Descriptors / Indicators: Grimacing, Discomfort, Aching Pain Intervention(s): Limited activity within patient's tolerance, Monitored during session     PT Goals (current goals can now be found in the care plan section) Acute Rehab PT Goals Patient Stated Goal: Maximize independence and return Home PT Goal Formulation: With patient Time For Goal Achievement: 05/14/24 Potential to Achieve Goals: Good Additional Goals Additional Goal #1: Pt will propel manual w/c ~152ft with supervision. Progress towards PT goals: Progressing toward goals    Frequency    Min 2X/week      PT Plan  Continue with current POC        AM-PAC PT 6 Clicks Mobility   Outcome Measure  Help needed turning from your back to your side while in a flat bed without using bedrails?: A Little Help needed moving from lying on your back to sitting on the side of a flat bed without using bedrails?: A Little Help needed moving to and from a bed to a chair (including a wheelchair)?: A Little Help needed standing up from a chair using your arms (e.g., wheelchair or bedside chair)?: A Little Help needed to walk in hospital room?: A Little Help needed climbing 3-5 steps with a railing? : A Lot 6 Click Score: 17    End of Session Equipment Utilized During Treatment: Gait belt Activity Tolerance: Patient tolerated treatment well Patient left: with call bell/phone within reach;in bed Nurse Communication: Mobility status PT Visit Diagnosis: Difficulty in walking, not elsewhere classified (R26.2);Other abnormalities of gait and mobility (R26.89);Unsteadiness on feet (R26.81)     Time:  8743-8691 PT Time Calculation (min) (ACUTE ONLY): 12 min  Charges:    $Therapeutic Activity: 8-22 mins PT General Charges $$ ACUTE PT VISIT: 1 Visit                    Dorothyann Maier, DPT, CLT  Acute Rehabilitation Services Office: [phone] (Secure chat preferred)    Dorothyann VEAR Maier 05/05/2024, 1:33 PM

## 2024-05-05 NOTE — Plan of Care (Signed)

## 2024-05-05 NOTE — Progress Notes (Signed)
 PROGRESS NOTE    [name]  FMW:[id number] DOB: [birth year] DOA: 04/27/2024 PCP: Lavell Bari [name], FNP   Brief Narrative: [name] is a 58 y.o. female with a history of hyperlipidemia, anxiety, osteoporosis, tobacco use.  Patient presented secondary to a fall after losing her balance on a stool, suffering left tibia plateau and fibular head/neck fractures, in addition to an open left third digit finger. Empiric antibiotics started and orthopedic surgery consulted for management, performing external fixation of left leg/tibia and closed reduction of left tibial plateau fracture.   Assessment/Plan:  Closed comminuted fracture of left tibia plateau Closed comminuted fracture of the left fibular head/neck Secondary to trauma from fall and noted on x-ray/CT imaging. Complicated by underlying osteoporosis. Orthopedic surgery consulted and performed external fixation of the left leg/tibia and closed reduction of the left tibial plateau fracture. ORIF of the left tibial plateau fracture performed on 11/10, with removal of external fixation. Recommendation for nonweightbearing and Lovenox  per orthopedic surgery. Recommendation for wound check and repeat x-rays 2 weeks after discharge. -PT/OT recommendations: SNF  Fall Secondary to loss of balance and fall off a stool. -PT/OT recommendations: SNF  Open left third digit fracture Secondary to fall. Cefazolin  started for antibiotic coverage. Orthopedic surgery consulted. Fingers redressed.  Leukocytosis Likely reactive and secondary to trauma and fracture. No fevers or symptoms for infection. Patient does have an open fracture, however. Antibiotics started and completed.  Generalized anxiety disorder -Continue Cymbalta   Acute blood loss anemia Baseline hemoglobin appears to be around 12-13. Hemoglobin of 11.8 on admission with drift downward. Postoperatively, hemoglobin is down to a low of 6.6 requiring 1 unit of PRBC via transfusion  on 11/11. -Transfuse for hemoglobin less than 7  Hyperlipidemia -Continue simvastatin   Osteoporosis -Continue calcium  and vitamin D   Tobacco use Cessation recommended. Nicotine  patch ordered. -Continue nicotine  patch   DVT prophylaxis: SCDs Code Status:   Code Status: Full Code Family Communication: None at bedside. Called sister but no response. Disposition Plan: Discharge SNF pending bed/insurance   Consultants:  Orthopedic surgery  Procedures:  Orthopedic surgery (11/7) External fixation of left leg/tibia Closed reduction of left tibial plateau fracture Dressing change to left hand Orthopedic surgery (11/10) Open reduction internal fixation of left bicondylar tibial plateau fracture Removal of left knee external fixation  Antimicrobials: Cefazolin  Vancomycin   Subjective: No concerns this morning  Objective: BP 98/81 (BP Location: Left Arm)   Pulse 93   Temp 97.8 F (36.6 C)   Resp 17   Ht 5' 5 (1.651 m)   Wt 57.6 kg   SpO2 99%   BMI 21.13 kg/m   Examination:  General exam: Appears calm and comfortable. Respiratory system: Clear to auscultation. Respiratory effort normal. Cardiovascular system: S1 & S2 heard, fast rate with normal rhythm. No murmur. Gastrointestinal system: Abdomen is nondistended, soft and nontender. Normal bowel sounds heard. Central nervous system: Alert and oriented. No focal neurological deficits. Musculoskeletal: Left leg in post-operative dressing.  Psychiatry: Judgement and insight appear normal. Mood & affect appropriate.    Data Reviewed: I have personally reviewed following labs and imaging studies   Last CBC Lab Results  Component Value Date   WBC 14.3 (H) 05/04/2024   HGB 9.0 (L) 05/04/2024   HCT 26.9 (L) 05/04/2024   MCV 97.1 05/04/2024   MCH 32.5 05/04/2024   RDW 14.6 05/04/2024   PLT 442 (H) 05/04/2024     Last metabolic panel Lab Results  Component Value Date   GLUCOSE  93 05/04/2024   NA 138  05/04/2024   K 4.0 05/04/2024   CL 101 05/04/2024   CO2 26 05/04/2024   BUN 7 05/04/2024   CREATININE 0.68 05/04/2024   GFRNONAA >60 05/04/2024   CALCIUM  8.2 (L) 05/04/2024   PHOS 3.0 05/04/2024   PROT 5.9 (L) 05/04/2024   ALBUMIN 2.6 (L) 05/04/2024   LABGLOB 2.2 04/07/2024   AGRATIO 1.6 10/09/2022   BILITOT 0.9 05/04/2024   ALKPHOS 49 05/04/2024   AST 27 05/04/2024   ALT 14 05/04/2024   ANIONGAP 11 05/04/2024     Creatinine Clearance: Estimated Creatinine Clearance: 69 mL/min (by C-G formula based on SCr of 0.68 mg/dL).  Recent Results (from the past 240 hours)  Culture, blood (Routine X 2) w Reflex to ID Panel     Status: None   Collection Time: 04/27/24 10:17 PM   Specimen: BLOOD RIGHT FOREARM  Result Value Ref Range Status   Specimen Description BLOOD RIGHT FOREARM  Final   Special Requests   Final    BOTTLES DRAWN AEROBIC AND ANAEROBIC Blood Culture adequate volume   Culture   Final    NO GROWTH 5 DAYS Performed at [facility], [address] [place], [place] [address]    Report Status 05/02/2024 FINAL  Final  Culture, blood (Routine X 2) w Reflex to ID Panel     Status: None   Collection Time: 04/27/24 10:17 PM   Specimen: BLOOD  Result Value Ref Range Status   Specimen Description BLOOD BLOOD RIGHT HAND  Final   Special Requests   Final    BOTTLES DRAWN AEROBIC AND ANAEROBIC Blood Culture adequate volume   Culture   Final    NO GROWTH 5 DAYS Performed at [facility], [address] [place], [place] [address]    Report Status 05/02/2024 FINAL  Final  MRSA Next Gen by PCR, Nasal     Status: Abnormal   Collection Time: 04/28/24  5:52 PM   Specimen: Nasal Mucosa; Nasal Swab  Result Value Ref Range Status   MRSA by PCR Next Gen DETECTED (A) NOT DETECTED Final    Comment: CRITICAL RESULTS CALLED TO, READ BACK BY AND VERIFIED WITH : RN M.SHIESTHA ON 04/29/24 AT 0042 BY NM (NOTE) The GeneXpert MRSA Assay (FDA approved for NASAL specimens only), is one component  of a comprehensive MRSA colonization surveillance program. It is not intended to diagnose MRSA infection nor to guide or monitor treatment for MRSA infections. Test performance is not FDA approved in patients less than 44 years old. Performed at [facility], [address] [place], [place] [address]       Radiology Studies: No results found.     LOS: 8 days    Elgin Lam, MD [facility] 05/05/2024, 11:35 AM   If 7PM-7AM, please contact night-coverage [email or web address]

## 2024-05-05 NOTE — Plan of Care (Signed)

## 2024-05-05 NOTE — TOC Transition Note (Signed)
 Transition of Care [facility]) - Discharge Note   Patient Details  Name: [name] MRN: [id number] Date of Birth: [birth year]  Transition of Care [facility]) CM/SW Contact:  Bridget Cordella Simmonds, LCSW Phone Number: 05/05/2024, 3:37 PM   Clinical Narrative:   Pt discharging to [facility] rehab.  RN call report to [phone].  PTAR called [phone].     Final next level of care: Skilled Nursing Facility Barriers to Discharge: Barriers Resolved   Patient Goals and CMS Choice            Discharge Placement              Patient chooses bed at:  [facility] rehab) Patient to be transferred to facility by: PTAR Name of family member notified: sister Tabitha Patient and family notified of of transfer: 05/05/24  Discharge Plan and Services Additional resources added to the After Visit Summary for                                       Social Drivers of Health (SDOH) Interventions SDOH Screenings   Food Insecurity: No Food Insecurity (04/28/2024)  Housing: Low Risk  (04/28/2024)  Transportation Needs: No Transportation Needs (04/28/2024)  Utilities: Not At Risk (04/28/2024)  Depression (PHQ2-9): Medium Risk (04/03/2023)  Social Connections: Unknown (11/04/2021)   Received from [facility]  Tobacco Use: Medium Risk (04/29/2024)     Readmission Risk Interventions     No data to display

## 2024-05-05 NOTE — Progress Notes (Signed)
 Mobility Specialist Progress Note:    05/05/24 1100  Mobility  Activity Ambulated with assistance  Level of Assistance Contact guard assist, steadying assist  Assistive Device Front wheel walker  Distance Ambulated (ft) 30 ft (+20+10+10)  LUE Weight Bearing Per Provider Order WBAT  LLE Weight Bearing Per Provider Order NWB  Activity Response Tolerated well  Mobility Referral Yes  Mobility visit 1 Mobility  Mobility Specialist Start Time (ACUTE ONLY) 1028  Mobility Specialist Stop Time (ACUTE ONLY) 1045  Mobility Specialist Time Calculation (min) (ACUTE ONLY) 17 min   Pt received in bed agreeable to mobility. No physical assistance required, contact guard and chair follow for safety. Pt took x4 seated rest breaks d/t fatigue. Wheeled back to room in recliner. Returned to bed w/ call bell and personal belongings in reach. All needs met. Bed alarm on.  Thersia Minder Mobility Specialist  Please contact vis Secure Chat or  Rehab Office [phone]

## 2024-05-09 ENCOUNTER — Ambulatory Visit: Payer: Self-pay

## 2024-05-09 ENCOUNTER — Telehealth: Payer: Self-pay

## 2024-05-09 NOTE — Telephone Encounter (Signed)
 I called pt & made her an appt w/DOD tomorrow to see [facility] for leg issues.

## 2024-05-09 NOTE — Telephone Encounter (Signed)
 FYI Only or Action Required?: Action required by provider: clinical question for provider and update on patient condition.  Patient was last seen in primary care on 04/07/2024 by Lavell Bari LABOR, FNP.  Called Nurse Triage reporting Leg Injury.  Symptoms began 11/5.  Symptoms are: unchanged.  Triage Disposition: See HCP Within 4 Hours (Or PCP Triage)  Patient/caregiver understands and will follow disposition?: No, wishes to speak with PCP      Copied from CRM #[id number]. Topic: Clinical - Red Word Triage >> May 09, 2024 12:12 PM Laurier C wrote: Red Word that prompted transfer to Nurse Triage: Patient states 11/5 patient broke her left leg/knee she was been hospitalized and did rehab but didn't stay long enough for them to prescribe her any pain medication. Patient states pain level is at 10.        Reason for Disposition  [1] SEVERE pain (e.g., excruciating pain, unable to do any normal activities) AND [2] not improved 2 hours after pain medicine/ice packs  Answer Assessment - Initial Assessment Questions Patient states she is unable to come in for an appointment and would like to get a prescription for her pain if possible. She also wants to know if she should be putting a cream on her surgical wound. Patient advised she should also check with her surgeon but wanted to know if there is anything the office could do. Please advise.      1. MECHANISM: How did the injury happen? (e.g., twisting injury, direct blow)      Fall 2. ONSET: When did the injury happen? (e.g., minutes, hours ago)      11/5 3. LOCATION: Where is the injury located?      Left leg  4. APPEARANCE of INJURY: What does the injury look like?  (e.g., deformity of leg)     Swelling of leg  5. SEVERITY: Can you put weight on that leg? Can you walk?      Moderate to severe pain 6. SIZE: For cuts, bruises, or swelling, ask: How large is it? (e.g., inches or centimeters)      Foot is twice the  size it normally is 7. PAIN: Is there pain? If Yes, ask: How bad is the pain?   What does it keep you from doing? (Scale 0-10; or none, mild, moderate, severe)     Moderate to severe  8. TETANUS: For any breaks in the skin, ask: When was your last tetanus booster?     N/A 9. OTHER SYMPTOMS: Do you have any other symptoms?      No  Protocols used: Leg Injury-A-AH

## 2024-05-09 NOTE — Transitions of Care (Post Inpatient/ED Visit) (Signed)
   05/09/2024  Name: [name] MRN: [id number] DOB: 1966-05-15  Today's TOC FU Call Status: Today's TOC FU Call Status:: Unsuccessful Call (1st Attempt) Unsuccessful Call (1st Attempt) Date: 05/09/24  Attempted to reach the patient regarding the most recent Inpatient/ED visit.  Follow Up Plan: Additional outreach attempts will be made to reach the patient to complete the Transitions of Care (Post Inpatient/ED visit) call.   Signature  Charmaine Bloodgood, LPN [facility] Advisor [facility] You Are. We Are. One [facility] Direct Dial [phone]

## 2024-05-10 ENCOUNTER — Encounter: Payer: Self-pay | Admitting: Family

## 2024-05-10 ENCOUNTER — Ambulatory Visit: Admitting: Family

## 2024-05-10 VITALS — BP 125/68 | HR 46 | Temp 97.4°F | Ht 65.0 in

## 2024-05-10 DIAGNOSIS — R531 Weakness: Secondary | ICD-10-CM | POA: Diagnosis not present

## 2024-05-10 DIAGNOSIS — S82142D Displaced bicondylar fracture of left tibia, subsequent encounter for closed fracture with routine healing: Secondary | ICD-10-CM

## 2024-05-10 DIAGNOSIS — M8000XS Age-related osteoporosis with current pathological fracture, unspecified site, sequela: Secondary | ICD-10-CM

## 2024-05-10 DIAGNOSIS — M8000XD Age-related osteoporosis with current pathological fracture, unspecified site, subsequent encounter for fracture with routine healing: Secondary | ICD-10-CM

## 2024-05-10 DIAGNOSIS — S62603S Fracture of unspecified phalanx of left middle finger, sequela: Secondary | ICD-10-CM | POA: Diagnosis not present

## 2024-05-10 DIAGNOSIS — S82142S Displaced bicondylar fracture of left tibia, sequela: Secondary | ICD-10-CM

## 2024-05-10 DIAGNOSIS — F411 Generalized anxiety disorder: Secondary | ICD-10-CM

## 2024-05-10 DIAGNOSIS — Z09 Encounter for follow-up examination after completed treatment for conditions other than malignant neoplasm: Secondary | ICD-10-CM | POA: Diagnosis not present

## 2024-05-10 DIAGNOSIS — S62603D Fracture of unspecified phalanx of left middle finger, subsequent encounter for fracture with routine healing: Secondary | ICD-10-CM | POA: Diagnosis not present

## 2024-05-10 MED ORDER — HYDROXYZINE PAMOATE 25 MG PO CAPS: 25.0000 mg | ORAL_CAPSULE | Freq: Four times a day (QID) | ORAL | 0 refills | Status: AC | PRN

## 2024-05-10 MED ORDER — OXYCODONE HCL 5 MG PO TABS: 5.0000 mg | ORAL_TABLET | Freq: Four times a day (QID) | ORAL | 0 refills | Status: DC | PRN

## 2024-05-10 NOTE — Patient Instructions (Signed)
Eating Plan for Osteoporosis Osteoporosis causes your bones to become weak and brittle. This puts you at greater risk for bone breaks (fractures) from small bumps or falls. Making changes to your diet and increasing your physical activity can help strengthen your bones and improve your overall health. Calcium and vitamin D are nutrients that play an important role in bone health. Vitamin D helps your body use calcium and strengthen bones. It is important to get enough calcium and vitamin D as part of your eating plan for osteoporosis. What are tips for following this plan? Reading food labels Try to get at least 1,000 milligrams (mg) of calcium each day. Look for foods that have at least 50 mg of calcium per serving. Talk with your health care provider about taking a calcium supplement if you do not get enough calcium from food. Do not have more than 2,500 mg of calcium each day. This is the upper limit for food and nutritional supplements combined. Too much calcium may cause constipation and prevent you from absorbing other important nutrients. Choose foods that contain vitamin D. Take a daily vitamin supplement that contains 800-1,000 international units (IU) of vitamin D. The amount may be different depending on your age, body weight, and where you live. Talk with your dietitian or health care provider about how much vitamin D is right for you. Avoid foods that have more than 300 mg of sodium per serving. Too much sodium can cause your body to lose calcium. Talk with your dietitian or health care provider about how much sodium you are allowed each day. Shopping Do not buy foods with added salt, including: Salted snacks. Rosita Fire. Canned soups. Canned meats. Processed meats, such as bacon or precooked or cured meat like sausages or meat loaves. Smoked fish. Meal planning Eat balanced meals that contain protein foods, fruits and vegetables, and foods rich in calcium and vitamin D. Eat at least  5 servings of fruits and vegetables each day. Eat 5-6 oz (142-170 g) of lean meat, poultry, fish, eggs, or beans each day. Lifestyle Do not use any products that contain nicotine or tobacco, such as cigarettes, e-cigarettes, and chewing tobacco. If you need help quitting, ask your health care provider. If your health care provider recommends that you lose weight: Work with a dietitian to develop an eating plan that will help you reach your desired weight goal. Exercise for at least 30 minutes a day, 5 or more days a week, or as told by your health care provider. Work with a physical therapist to develop an exercise plan that includes flexibility, balance, and strength exercises. Do not focus only on aerobic exercise. Do not drink alcohol if: Your health care provider tells you not to drink. You are pregnant, may be pregnant, or are planning to become pregnant. If you drink alcohol: Limit how much you use to: 0-1 drink a day for women. 0-2 drinks a day for men. Be aware of how much alcohol is in your drink. In the [place] one drink equals one 12 oz bottle of beer (355 mL), one 5 oz glass of wine (148 mL), or one 1 oz glass of hard liquor (44 mL). What foods should I eat? Foods high in calcium  Yogurt. Yogurt with fruit. Milk. Evaporated skim milk. Dry milk powder. Calcium-fortified orange juice. Parmesan cheese. Part-skim ricotta cheese. Natural hard cheese. Cream cheese. Cottage cheese. Canned sardines. Canned salmon. Calcium-treated tofu. Calcium-fortified cereal bar. Calcium-fortified cereal. Calcium-fortified graham crackers. Cooked collard greens. Turnip greens. Broccoli.  Kale. Almonds. White beans. Corn tortilla. Foods high in vitamin D Cod liver oil. Fatty fish, such as tuna, mackerel, and salmon. Milk. Fortified soy milk. Fortified fruit juice. Yogurt. Margarine. Egg yolks. Foods high in protein Beef. Lamb. Pork tenderloin. Chicken breast. Tuna (canned). Fish  fillet. Tofu. Cooked soy beans. Soy patty. Beans (canned or cooked). Cottage cheese. Yogurt. Peanut butter. Pumpkin seeds. Nuts. Sunflower seeds. Hard cheese. Milk or other milk products, such as soy milk. The items listed above may not be a complete list of foods and beverages you can eat. Contact a dietitian for more options. Summary Calcium and vitamin D are nutrients that play an important role in bone health and are an important part of your eating plan for osteoporosis. Eat balanced meals that contain protein foods, fruits and vegetables, and foods rich in calcium and vitamin D. Avoid foods that have more than 300 mg of sodium per serving. Too much sodium can cause your body to lose calcium. Exercise is an important part of prevention and treatment of osteoporosis. Aim for at least 30 minutes a day, 5 days a week. This information is not intended to replace advice given to you by your health care provider. Make sure you discuss any questions you have with your health care provider. Document Revised: 11/24/2019 Document Reviewed: 11/24/2019 Elsevier Patient Education  2024 [employer].

## 2024-05-10 NOTE — Progress Notes (Signed)
 Subjective:    Patient ID: [name], female    DOB: [birth year], 58 y.o.   MRN: [id number]  Chief Complaint  Patient presents with   Hospitalization Follow-up    HPI  She presents to the office today for hospital follow up. She went to the ED on 04/27/24 after falling off a stool and had a left tibia plateau and fibular head/neck fractures, in addition to an open left third digit finger.  She was given empiric antibiotics and followed by Ortho surgeon.   She had external fixation of left leg/tibia and closed reduction of left tibial plateau fracture.   She was discharged to a SNF on 05/05/24 to [facility]. She was discharged from Rehab on 05/18/24.   She reports her pain is worse and and aching throbbing pain of a 10 out 10. She is taking tylenol  and motrin . She was taking oxycodone  in the SNF, but has not had any since leaving.   She has a follow up with Ortho.   Review of Systems  All other systems reviewed and are negative.   Social History   Socioeconomic History   Marital status: Divorced    Spouse name: Not on file   Number of children: Not on file   Years of education: Not on file   Highest education level: Not on file  Occupational History   Not on file  Tobacco Use   Smoking status: Former    Current packs/day: 0.00    Average packs/day: 1 pack/day for 29.0 years (29.0 ttl pk-yrs)    Types: Cigarettes    Start date: 09/24/1993    Quit date: 09/25/2022    Years since quitting: 1.6   Smokeless tobacco: Never  Vaping Use   Vaping status: Never Used  Substance and Sexual Activity   Alcohol use: No    Alcohol/week: 0.0 standard drinks of alcohol   Drug use: No   Sexual activity: Not on file  Other Topics Concern   Not on file  Social History Narrative   Not on file   Social Drivers of Health   Financial Resource Strain: Not on file  Food Insecurity: No Food Insecurity (04/28/2024)   Hunger Vital Sign    Worried About Running Out of Food in  the Last Year: Never true    Ran Out of Food in the Last Year: Never true  Transportation Needs: No Transportation Needs (04/28/2024)   PRAPARE - Administrator, Civil Service (Medical): No    Lack of Transportation (Non-Medical): No  Physical Activity: Not on file  Stress: Not on file  Social Connections: Unknown (11/04/2021)   Received from [facility]   Social Network    Social Network: Not on file   Family History  Problem Relation Age of Onset   COPD Mother    Osteoporosis Mother    COPD Father    Osteoporosis Father    Colon cancer Neg Hx    Breast cancer Neg Hx         Objective:   Physical Exam Vitals reviewed.  Constitutional:      General: She is not in acute distress.    Appearance: She is well-developed.  HENT:     Head: Normocephalic and atraumatic.  Eyes:     Pupils: Pupils are equal, round, and reactive to light.  Neck:     Thyroid : No thyromegaly.  Cardiovascular:     Rate and Rhythm: Normal rate and regular rhythm.  Heart sounds: Normal heart sounds. No murmur heard. Pulmonary:     Effort: Pulmonary effort is normal. No respiratory distress.     Breath sounds: Normal breath sounds. No wheezing.  Abdominal:     General: Bowel sounds are normal. There is no distension.     Palpations: Abdomen is soft.     Tenderness: There is no abdominal tenderness.  Musculoskeletal:        General: No tenderness. Normal range of motion.     Cervical back: Normal range of motion and neck supple.  Skin:    General: Skin is warm and dry.     Findings: Bruising and erythema present.     Comments: Wound well approximated, see picture  Neurological:     Mental Status: She is alert and oriented to person, place, and time.     Cranial Nerves: No cranial nerve deficit.     Deep Tendon Reflexes: Reflexes are normal and symmetric.  Psychiatric:        Behavior: Behavior normal.        Thought Content: Thought content normal.        Judgment: Judgment  normal.          BP 125/68   Pulse (!) 46   Temp (!) 97.4 F (36.3 C) (Temporal)   Ht 5' 5 (1.651 m)   SpO2 96%   BMI 21.13 kg/m      Assessment & Plan:  [name] comes in today with chief complaint of Hospitalization Follow-up   Diagnosis and orders addressed:  1. Closed fracture of left tibial plateau, sequela (Primary) - Ambulatory referral to Home Health - oxyCODONE  (OXY IR/ROXICODONE ) 5 MG immediate release tablet; Take 1-2 tablets (5-10 mg total) by mouth every 6 (six) hours as needed (5 mg pain score 4-6, 10 mg pain score 7-10).  Dispense: 90 tablet; Refill: 0 - Ambulatory referral to Orthopedic Surgery  2. Closed nondisplaced fracture of phalanx of left middle finger, unspecified phalanx, sequela - Ambulatory referral to Home Health - oxyCODONE  (OXY IR/ROXICODONE ) 5 MG immediate release tablet; Take 1-2 tablets (5-10 mg total) by mouth every 6 (six) hours as needed (5 mg pain score 4-6, 10 mg pain score 7-10).  Dispense: 90 tablet; Refill: 0 - Ambulatory referral to Orthopedic Surgery  3. Hospital discharge follow-up  4. Osteoporosis with current pathological fracture, unspecified osteoporosis type, sequela  5. GAD (generalized anxiety disorder) - hydrOXYzine  (VISTARIL ) 25 MG capsule; Take 1 capsule (25 mg total) by mouth every 6 (six) hours as needed.  Dispense: 90 capsule; Refill: 0  Will give oxycodone  as needed  Referral to Ortho, follow up with surgeon Vistaril  as needed, can take at night to help with sleep Referral to [facility] notes reviewed  Follow up in 1 month    Bari Learn, FNP

## 2024-05-12 ENCOUNTER — Ambulatory Visit: Payer: Self-pay | Admitting: *Deleted

## 2024-05-12 NOTE — Telephone Encounter (Signed)
 Recommended OV. Patient daughter reports patient last OV 05/10/24 for same sx. Please advise. Patient requesting call back.      FYI Only or Action Required?: Action required by provider: update on patient condition and requesting antibiotic , medication for nausea and bedside commode seat or elevated commode seat.  Patient was last seen in primary care on 05/10/2024 by Lavell Bari LABOR, FNP.  Called Nurse Triage reporting Leg Swelling.  Symptoms began several days ago. Sunday   Interventions attempted: Rest, hydration, or home remedies and Ice/heat application.  Symptoms are: gradually worsening.  Triage Disposition: See Physician Within 24 Hours  Patient/caregiver understands and will follow disposition?: No, wishes to speak with PCP                Copied from CRM #[id number]. Topic: Clinical - Red Word Triage >> May 12, 2024  3:21 PM Joesph NOVAK wrote: [facility] that prompted transfer to Nurse Triage: Nausea, Leg is swelling.   also needs a seat for pt to use the bathroom. Reason for Disposition  Looks like a boil, infected sore, deep ulcer or other infected rash (spreading redness, pus)  Answer Assessment - Initial Assessment Questions Recommended OV. Patient and daughter, [place], with patient now; requesting if PCP will order medication for nausea, antibiotic for left ankle skin swelling and itching. Requesting PCP to order bedside commode or elevated commode seat due to patient having difficulty getting up from commode. Please advise. Last OV 05/10/24.     1. ONSET: When did the swelling start? (e.g., minutes, hours, days)     Sunday  2. LOCATION: What part of the leg is swollen?  Are both legs swollen or just one leg?     Ankle down  left leg  3. SEVERITY: How bad is the swelling? (e.g., localized; mild, moderate, severe)     Severe skin shiny 4. REDNESS: Is there redness or signs of infection?     No redness  5. PAIN: Is the swelling painful to  touch? If Yes, ask: How painful is it?   (Scale 1-10; mild, moderate or severe)     Severe  6. FEVER: Do you have a fever? If Yes, ask: What is it, how was it measured, and when did it start?      Na  7. CAUSE: What do you think is causing the leg swelling?     Not sure  8. MEDICAL HISTORY: Do you have a history of blood clots (e.g., DVT), cancer, heart failure, kidney disease, or liver failure?     See hx  9. RECURRENT SYMPTOM: Have you had leg swelling before? If Yes, ask: When was the last time? What happened that time?     Yes but  reports worsening since last OV 05/10/24 10. OTHER SYMPTOMS: Do you have any other symptoms? (e.g., chest pain, difficulty breathing)       Itching skin left ankle swelling , warm to touch, flaky skin. Chest pain / discomfort soreness with movement denies sweating, no dizziness no SOB or difficulty breathing. Nausea reported at times since leaving nursing facility on Sunday. 11. PREGNANCY: Is there any chance you are pregnant? When was your last menstrual period?       na  Protocols used: Leg Swelling and Edema-A-AH

## 2024-05-13 ENCOUNTER — Other Ambulatory Visit: Payer: Self-pay | Admitting: Family

## 2024-05-13 ENCOUNTER — Ambulatory Visit: Payer: Self-pay

## 2024-05-13 ENCOUNTER — Encounter: Payer: Self-pay | Admitting: Nurse Practitioner

## 2024-05-13 ENCOUNTER — Ambulatory Visit (INDEPENDENT_AMBULATORY_CARE_PROVIDER_SITE_OTHER): Admitting: Nurse Practitioner

## 2024-05-13 VITALS — BP 98/63 | HR 107 | Temp 97.7°F

## 2024-05-13 DIAGNOSIS — L089 Local infection of the skin and subcutaneous tissue, unspecified: Secondary | ICD-10-CM | POA: Diagnosis not present

## 2024-05-13 DIAGNOSIS — S82142D Displaced bicondylar fracture of left tibia, subsequent encounter for closed fracture with routine healing: Secondary | ICD-10-CM

## 2024-05-13 MED ORDER — CEPHALEXIN 500 MG PO CAPS: 500.0000 mg | ORAL_CAPSULE | Freq: Four times a day (QID) | ORAL | 0 refills | Status: AC

## 2024-05-13 MED ORDER — ONDANSETRON HCL 4 MG PO TABS: 4.0000 mg | ORAL_TABLET | Freq: Three times a day (TID) | ORAL | 2 refills | Status: AC | PRN

## 2024-05-13 NOTE — Patient Instructions (Signed)

## 2024-05-13 NOTE — Progress Notes (Signed)
   Subjective:    Patient ID: [name], female    DOB: 29-Oct-1965, 58 y.o.   MRN: [id number]   Chief Complaint: left leg pain  HPI  Patient had left leg fracture on 04/27/24. Had to have open reduction, done on 04/29/24 and 05/02/24. They sent her to rehab from hospital. Family dd not feel that she was getting good care in facility so they checked her out AMA. Today she is complaining of swelling daily. Leg looks red and feels warm to touch. Patient Active Problem List   Diagnosis Date Noted   Fall at home, initial encounter 05/02/2024   Closed fracture of left tibial plateau, initial encounter 04/27/2024   Depression, major, single episode, mild 06/22/2022   Hypokalemia 06/22/2022   Allergic rhinitis 06/22/2022   Chronic back pain 04/21/2017   Tobacco abuse 10/20/2016   Osteoporosis 04/23/2016   Anemia, iron deficiency 10/16/2015   Constipation 10/16/2015   H/O fracture of left hip    Patella fracture 12/28/2014   GAD (generalized anxiety disorder) 03/06/2014   HLD (hyperlipidemia) 03/06/2014   Essential hypertension, benign 03/06/2014       Review of Systems  Constitutional:  Negative for diaphoresis.  Eyes:  Negative for pain.  Respiratory:  Negative for shortness of breath.   Cardiovascular:  Negative for chest pain, palpitations and leg swelling.  Gastrointestinal:  Negative for abdominal pain.  Endocrine: Negative for polydipsia.  Skin:  Negative for rash.  Neurological:  Negative for dizziness, weakness and headaches.  Hematological:  Does not bruise/bleed easily.  All other systems reviewed and are negative.      Objective:   Physical Exam Constitutional:      Appearance: Normal appearance.  Cardiovascular:     Rate and Rhythm: Normal rate and regular rhythm.     Heart sounds: Normal heart sounds.  Pulmonary:     Effort: Pulmonary effort is normal.     Breath sounds: Normal breath sounds.  Skin:    Comments: Left leg wound edges well approximated   No drainage Mild erythema Left lower leg hot to touch with 2+ edema  Neurological:     General: No focal deficit present.     Mental Status: She is alert and oriented to person, place, and time.  Psychiatric:        Mood and Affect: Mood normal.        Behavior: Behavior normal.    BP 98/63   Pulse (!) 107   Temp 97.7 F (36.5 C) (Temporal)   SpO2 98%         Assessment & Plan:  [name] in today with chief complaint of No chief complaint on file.   1. Wound infection (Primary) Clean with antibacterial soap BID Ice as needed Elevate when sitting Compression wrap daily RTO prn   - cephALEXin  (KEFLEX ) 500 MG capsule; Take 1 capsule (500 mg total) by mouth 4 (four) times daily.  Dispense: 40 capsule; Refill: 0    The above assessment and management plan was discussed with the patient. The patient verbalized understanding of and has agreed to the management plan. Patient is aware to call the clinic if symptoms persist or worsen. Patient is aware when to return to the clinic for a follow-up visit. Patient educated on when it is appropriate to go to the emergency department.   Mary-Margaret Gladis, FNP

## 2024-05-13 NOTE — Telephone Encounter (Signed)
 Zofran  Prescription sent to pharmacy and bedside commode rx printed. Please fax to  where ever they need it.

## 2024-05-13 NOTE — Telephone Encounter (Signed)
 FYI Only or Action Required?: FYI only for provider: appointment scheduled on 05/13/2024.  Patient was last seen in primary care on 05/10/2024 by Lavell Bari LABOR, FNP.  Called Nurse Triage reporting Leg Swelling.  Symptoms began several weeks ago.  Interventions attempted: Prescription medications: pain medication, Rest, hydration, or home remedies, and Ice/heat application.  Symptoms are: gradually worsening.  Triage Disposition: See HCP Within 4 Hours (Or PCP Triage)  Patient/caregiver understands and will follow disposition?: Yes         Copied from CRM #[id number]. Topic: Clinical - Red Word Triage >> May 13, 2024  1:32 PM [name] wrote: Red Word that prompted transfer to Nurse Triage:  [name], the patient's daughter, is calling to report that the patient broke her left leg on 11/5 and was seen by the provider on 11/18. The patient's left leg remains swollen and painful. She also had a fever of 73F last night Reason for Disposition  SEVERE leg swelling (e.g., swelling extends above knee, entire leg is swollen, weeping fluid)  Answer Assessment - Initial Assessment Questions This RN spoke with the pt's daughter regarding symptoms. Elevating leg using ice and heat for symptoms and she states symptoms have not improved. Also requesting information regarding not getting full pain med prescription.   1. ONSET: When did the swelling start? (e.g., minutes, hours, days)     04/27/2024 2. LOCATION: What part of the leg is swollen?  Are both legs swollen or just one leg?     Dixon leg  3. SEVERITY: How bad is the swelling? (e.g., localized; mild, moderate, severe)     She states the are is very tight  4. REDNESS: Is there redness or signs of infection?     Yes she has some redness 5. PAIN: Is the swelling painful to touch? If Yes, ask: How painful is it?   (Scale 1-10; mild, moderate or severe)     Severe  6. FEVER: Do you have a fever? If Yes, ask: What is it,  how was it measured, and when did it start?      Temp was 99  7. OTHER SYMPTOMS: Do you have any other symptoms? (e.g., chest pain, difficulty breathing)       Nausea  Protocols used: Leg Swelling and Edema-A-AH

## 2024-05-13 NOTE — Telephone Encounter (Signed)
 Appt made.

## 2024-05-13 NOTE — Telephone Encounter (Signed)
 Sent over to [place]

## 2024-05-13 NOTE — Addendum Note (Signed)
 Addended by: LAVELL LYE A on: 05/13/2024 01:52 PM   Modules accepted: Orders

## 2024-05-18 ENCOUNTER — Telehealth: Payer: Self-pay | Admitting: Family

## 2024-05-18 ENCOUNTER — Other Ambulatory Visit: Payer: Self-pay | Admitting: Family

## 2024-05-18 DIAGNOSIS — S82142S Displaced bicondylar fracture of left tibia, sequela: Secondary | ICD-10-CM

## 2024-05-18 DIAGNOSIS — S62603S Fracture of unspecified phalanx of left middle finger, sequela: Secondary | ICD-10-CM

## 2024-05-18 NOTE — Telephone Encounter (Unsigned)
 Copied from CRM #[id number]. Topic: Clinical - Medication Refill >> May 18, 2024 11:58 AM Zebedee SAUNDERS wrote: Medication: oxyCODONE  (OXY IR/ROXICODONE ) 5 MG immediate release tablet  Has the patient contacted their pharmacy? Yes (Agent: If no, request that the patient contact the pharmacy for the refill. If patient does not wish to contact the pharmacy document the reason why and proceed with request.) (Agent: If yes, when and what did the pharmacy advise?)  This is the patient's preferred pharmacy:  [id number] - [place], [place] - [address] [place] [address] Phone: [phone] Fax: [phone]  Is this the correct pharmacy for this prescription? Yes If no, delete pharmacy and type the correct one.   Has the prescription been filled recently? Yes  Is the patient out of the medication? Yes  Has the patient been seen for an appointment in the last year OR does the patient have an upcoming appointment? Yes  Can we respond through MyChart? Yes  Agent: Please be advised that Rx refills may take up to 3 business days. We ask that you follow-up with your pharmacy.

## 2024-05-18 NOTE — Telephone Encounter (Signed)
 Informed pt [name] is off on Wednesdays and our office is closed the rest of the week. Informed her I will send the message to [facility] to review next week. Pt verbalized understanding

## 2024-05-18 NOTE — Telephone Encounter (Signed)
 Copied from CRM #[id number]. Topic: Clinical - Order For Equipment >> May 18, 2024 12:59 PM Montie POUR wrote: Reason for CRM:  [name] needs a rollator walker since she cannot get around the house since she had surgery. Please send it to [facility] at fax [phone].

## 2024-05-23 NOTE — Telephone Encounter (Signed)
Please fax rx

## 2024-05-23 NOTE — Telephone Encounter (Signed)
 Faxed

## 2024-05-23 NOTE — Addendum Note (Signed)
 Addended by: LAVELL LYE A on: 05/23/2024 01:46 PM   Modules accepted: Orders

## 2024-05-24 ENCOUNTER — Telehealth: Payer: Self-pay | Admitting: *Deleted

## 2024-05-24 DIAGNOSIS — S62603S Fracture of unspecified phalanx of left middle finger, sequela: Secondary | ICD-10-CM

## 2024-05-24 DIAGNOSIS — S82142S Displaced bicondylar fracture of left tibia, sequela: Secondary | ICD-10-CM

## 2024-05-24 DIAGNOSIS — S82142D Displaced bicondylar fracture of left tibia, subsequent encounter for closed fracture with routine healing: Secondary | ICD-10-CM | POA: Diagnosis not present

## 2024-05-24 MED ORDER — OXYCODONE HCL 5 MG PO TABS: 5.0000 mg | ORAL_TABLET | Freq: Four times a day (QID) | ORAL | 0 refills | Status: AC | PRN

## 2024-05-24 NOTE — Telephone Encounter (Signed)
 Oxycodone  5 mg tablets #90 has been approved by [employer]eds/[employer]. A new script is required.

## 2024-05-24 NOTE — Telephone Encounter (Signed)
 Prescription sent to pharmacy.

## 2024-07-11 ENCOUNTER — Encounter (INDEPENDENT_AMBULATORY_CARE_PROVIDER_SITE_OTHER): Admitting: Otolaryngology

## 2024-07-11 ENCOUNTER — Ambulatory Visit: Attending: Student | Admitting: Physical Therapy

## 2024-07-12 ENCOUNTER — Other Ambulatory Visit: Payer: Self-pay | Admitting: Family Medicine

## 2024-07-12 ENCOUNTER — Other Ambulatory Visit: Payer: Self-pay | Admitting: Family

## 2024-07-12 DIAGNOSIS — G8929 Other chronic pain: Secondary | ICD-10-CM

## 2024-07-12 MED ORDER — CYCLOBENZAPRINE HCL 10 MG PO TABS: 10.0000 mg | ORAL_TABLET | Freq: Three times a day (TID) | ORAL | 1 refills | Status: AC | PRN

## 2024-07-12 NOTE — Telephone Encounter (Signed)
 Copied from CRM [id number]. Topic: Clinical - Medication Refill >> Jul 12, 2024 11:51 AM Zebedee SAUNDERS wrote: Medication: cyclobenzaprine  (FLEXERIL ) 10 MG tablet  Has the patient contacted their pharmacy? Yes (Agent: If no, request that the patient contact the pharmacy for the refill. If patient does not wish to contact the pharmacy document the reason why and proceed with request.) (Agent: If yes, when and what did the pharmacy advise?)  This is the patient's preferred pharmacy:  [id number] - [place], [place] - [address] [place] [address] Phone: [phone] Fax: [phone]  Is this the correct pharmacy for this prescription? Yes If no, delete pharmacy and type the correct one.   Has the prescription been filled recently? Yes  Is the patient out of the medication? Yes  Has the patient been seen for an appointment in the last year OR does the patient have an upcoming appointment? Yes  Can we respond through MyChart? Yes  Agent: Please be advised that Rx refills may take up to 3 business days. We ask that you follow-up with your pharmacy.

## 2024-07-21 ENCOUNTER — Ambulatory Visit: Admitting: Physical Therapy

## 2024-07-21 NOTE — Therapy (Unsigned)
 " OUTPATIENT PHYSICAL THERAPY LOWER EXTREMITY EVALUATION   Patient Name: [name] MRN: [id number] DOB:1966-02-11, 59 y.o., female Today's Date: 07/21/2024  END OF SESSION:   Past Medical History:  Diagnosis Date   Anemia    Anxiety    Hyperlipidemia    Hypertension    Osteoporosis    Past Surgical History:  Procedure Laterality Date   ABDOMINAL HYSTERECTOMY     Partial   EXTERNAL FIXATION REMOVAL Left 05/02/2024   Procedure: REMOVAL, EXTERNAL FIXATION DEVICE, LOWER EXTREMITY;  Surgeon: Kendal Franky SQUIBB, MD;  Location: [facility];  Service: Orthopedics;  Laterality: Left;   HARDWARE REMOVAL Left 06/20/2015   Procedure: HARDWARE REMOVAL;  Surgeon: Taft FORBES Minerva, MD;  Location: [facility];  Service: Orthopedics;  Laterality: Left;  left knee   INCISION AND DRAINAGE OF WOUND Left 04/29/2024   Procedure: IRRIGATION AND DEBRIDEMENT WOUND, LEFT HAND;  Surgeon: Kendal Franky SQUIBB, MD;  Location: [facility];  Service: Orthopedics;  Laterality: Left;   ORIF PATELLA Left 12/29/2014   Procedure: OPEN REDUCTION INTERNAL FIXATION LEFT PATELLA;  Surgeon: Taft FORBES Minerva, MD;  Location: [facility];  Service: Orthopedics;  Laterality: Left;   ORIF TIBIA PLATEAU Left 04/29/2024   Procedure: OPEN REDUCTION INTERNAL FIXATION (ORIF) TIBIAL PLATEAU;  Surgeon: Kendal Franky SQUIBB, MD;  Location: [facility];  Service: Orthopedics;  Laterality: Left;   ORIF TIBIA PLATEAU Left 05/02/2024   Procedure: OPEN REDUCTION INTERNAL FIXATION (ORIF) TIBIAL PLATEAU;  Surgeon: Kendal Franky SQUIBB, MD;  Location: [facility];  Service: Orthopedics;  Laterality: Left;   TOTAL HIP ARTHROPLASTY Left 06/23/2022   Procedure: TOTAL HIP ARTHROPLASTY ANTERIOR APPROACH;  Surgeon: Fidel Rogue, MD;  Location: [facility];  Service: Orthopedics;  Laterality: Left;   Patient Active Problem List   Diagnosis Date Noted   Fall at home, initial encounter 05/02/2024   Closed fracture of left tibial plateau, initial encounter 04/27/2024   Depression, major, single  episode, mild 06/22/2022   Hypokalemia 06/22/2022   Allergic rhinitis 06/22/2022   Chronic back pain 04/21/2017   Tobacco abuse 10/20/2016   Osteoporosis 04/23/2016   Anemia, iron deficiency 10/16/2015   Constipation 10/16/2015   H/O fracture of left hip    Patella fracture 12/28/2014   GAD (generalized anxiety disorder) 03/06/2014   HLD (hyperlipidemia) 03/06/2014   Essential hypertension, benign 03/06/2014    PCP: Lavell Bari [name], FNP  REFERRING PROVIDER: Kendal Franky, MD  REFERRING DIAG: L bicondylar tibia fracture  THERAPY DIAG:  No diagnosis found.  Rationale for Evaluation and Treatment: Rehabilitation  ONSET DATE: ORIF L bicondylar tibial plateau fracture 04/29/24  SUBJECTIVE:   SUBJECTIVE STATEMENT: ***  PERTINENT HISTORY: Advance WBAT and unrestricted knee ROM  PAIN:  Are you having pain? {OPRCPAIN:27236}  PRECAUTIONS: {Therapy precautions:24002}  RED FLAGS: {PT Red Flags:29287}   WEIGHT BEARING RESTRICTIONS: {Yes ***/No:24003}  FALLS:  Has patient fallen in last 6 months? {fallsyesno:27318}  LIVING ENVIRONMENT: Lives with: {OPRC lives with:25569::lives with their family} Lives in: {Lives in:25570} Stairs: {opstairs:27293} Has following equipment at home: {Assistive devices:23999}  OCCUPATION: ***  PLOF: {PLOF:24004}  PATIENT GOALS: ***  NEXT MD VISIT: ***  OBJECTIVE:  Note: Objective measures were completed at Evaluation unless otherwise noted.  DIAGNOSTIC FINDINGS: ***  PATIENT SURVEYS:  {rehab surveys:24030}  COGNITION: Overall cognitive status: {cognition:24006}     SENSATION: {sensation:27233}  EDEMA:  {edema:24020}  MUSCLE LENGTH: Hamstrings: Right *** deg; Left *** deg Thomas test: Right *** deg; Left *** deg  POSTURE: {posture:25561}  PALPATION: ***  LOWER EXTREMITY ROM:  {AROM/PROM:27142} ROM Right eval Left eval  Hip flexion    Hip extension    Hip abduction    Hip adduction    Hip internal  rotation    Hip external rotation    Knee flexion    Knee extension    Ankle dorsiflexion    Ankle plantarflexion    Ankle inversion    Ankle eversion     (Blank rows = not tested)  LOWER EXTREMITY MMT:  MMT Right eval Left eval  Hip flexion    Hip extension    Hip abduction    Hip adduction    Hip internal rotation    Hip external rotation    Knee flexion    Knee extension    Ankle dorsiflexion    Ankle plantarflexion    Ankle inversion    Ankle eversion     (Blank rows = not tested)  LOWER EXTREMITY SPECIAL TESTS:  {LEspecialtests:26242}  FUNCTIONAL TESTS:  {Functional tests:24029}  GAIT: Distance walked: *** Assistive device utilized: {Assistive devices:23999} Level of assistance: {Levels of assistance:24026} Comments: ***                                                                                                                                TREATMENT DATE: ***    PATIENT EDUCATION:  Education details: *** Person educated: {Person educated:25204} Education method: {Education Method:25205} Education comprehension: {Education Comprehension:25206}  HOME EXERCISE PROGRAM: ***  ASSESSMENT:  CLINICAL IMPRESSION: Patient is a *** y.o. *** who was seen today for physical therapy evaluation and treatment for ***.   OBJECTIVE IMPAIRMENTS: {opptimpairments:25111}.   ACTIVITY LIMITATIONS: {activitylimitations:27494}  PARTICIPATION LIMITATIONS: {participationrestrictions:25113}  PERSONAL FACTORS: {Personal factors:25162} are also affecting patient's functional outcome.   REHAB POTENTIAL: {rehabpotential:25112}  CLINICAL DECISION MAKING: {clinical decision making:25114}  EVALUATION COMPLEXITY: {Evaluation complexity:25115}   GOALS: Goals reviewed with patient? {yes/no:20286}  SHORT TERM GOALS: Target date: *** *** Baseline: Goal status: INITIAL  2.  *** Baseline:  Goal status: INITIAL  3.  *** Baseline:  Goal status: INITIAL  4.   *** Baseline:  Goal status: INITIAL  5.  *** Baseline:  Goal status: INITIAL  6.  *** Baseline:  Goal status: INITIAL  LONG TERM GOALS: Target date: ***  *** Baseline:  Goal status: INITIAL  2.  *** Baseline:  Goal status: INITIAL  3.  *** Baseline:  Goal status: INITIAL  4.  *** Baseline:  Goal status: INITIAL  5.  *** Baseline:  Goal status: INITIAL  6.  *** Baseline:  Goal status: INITIAL   PLAN:  PT FREQUENCY: {rehab frequency:25116}  PT DURATION: {rehab duration:25117}  PLANNED INTERVENTIONS: {rehab planned interventions:25118::97110-Therapeutic exercises,97530- Therapeutic [phone]- Neuromuscular re-education,97535- Self Rjmz,02859- Manual therapy,Patient/Family education}  PLAN FOR NEXT SESSION: ***   Safal Halderman April Ma L Johnthan Axtman, PT 07/21/2024, 8:18 AM  "

## 2024-10-06 ENCOUNTER — Ambulatory Visit: Payer: Self-pay | Admitting: Family

## 2024-10-17 ENCOUNTER — Encounter (INDEPENDENT_AMBULATORY_CARE_PROVIDER_SITE_OTHER): Admitting: Otolaryngology
